# Patient Record
Sex: Female | Born: 1940
Health system: Southern US, Community
[De-identification: ages and names within clinical notes are randomized; demographics above are authoritative.]

## PROBLEM LIST (undated history)

## (undated) DIAGNOSIS — E559 Vitamin D deficiency, unspecified: Secondary | ICD-10-CM

## (undated) DIAGNOSIS — Z87440 Personal history of urinary (tract) infections: Secondary | ICD-10-CM

## (undated) DIAGNOSIS — K219 Gastro-esophageal reflux disease without esophagitis: Secondary | ICD-10-CM

## (undated) DIAGNOSIS — E785 Hyperlipidemia, unspecified: Secondary | ICD-10-CM

## (undated) DIAGNOSIS — R0600 Dyspnea, unspecified: Secondary | ICD-10-CM

## (undated) DIAGNOSIS — I5189 Other ill-defined heart diseases: Secondary | ICD-10-CM

## (undated) DIAGNOSIS — R0609 Other forms of dyspnea: Secondary | ICD-10-CM

## (undated) DIAGNOSIS — R251 Tremor, unspecified: Secondary | ICD-10-CM

## (undated) DIAGNOSIS — I351 Nonrheumatic aortic (valve) insufficiency: Secondary | ICD-10-CM

## (undated) DIAGNOSIS — Z86711 Personal history of pulmonary embolism: Secondary | ICD-10-CM

## (undated) DIAGNOSIS — I10 Essential (primary) hypertension: Secondary | ICD-10-CM

## (undated) DIAGNOSIS — R079 Chest pain, unspecified: Secondary | ICD-10-CM

## (undated) DIAGNOSIS — I5030 Unspecified diastolic (congestive) heart failure: Secondary | ICD-10-CM

## (undated) DIAGNOSIS — I480 Paroxysmal atrial fibrillation: Secondary | ICD-10-CM

## (undated) DIAGNOSIS — Z8701 Personal history of pneumonia (recurrent): Secondary | ICD-10-CM

## (undated) DIAGNOSIS — M810 Age-related osteoporosis without current pathological fracture: Secondary | ICD-10-CM

## (undated) DIAGNOSIS — I340 Nonrheumatic mitral (valve) insufficiency: Secondary | ICD-10-CM

## (undated) DIAGNOSIS — J9610 Chronic respiratory failure, unspecified whether with hypoxia or hypercapnia: Secondary | ICD-10-CM

## (undated) DIAGNOSIS — I4891 Unspecified atrial fibrillation: Secondary | ICD-10-CM

## (undated) DIAGNOSIS — I959 Hypotension, unspecified: Secondary | ICD-10-CM

## (undated) DIAGNOSIS — Z8673 Personal history of transient ischemic attack (TIA), and cerebral infarction without residual deficits: Secondary | ICD-10-CM

## (undated) HISTORY — PX: LUMBAR SPINE SURGERY: SHX701

## (undated) HISTORY — PX: CERVICAL SPINE SURGERY: SHX589

## (undated) HISTORY — PX: OTHER SURGICAL HISTORY: SHX169

## (undated) HISTORY — DX: Nonrheumatic aortic (valve) insufficiency: I35.1

## (undated) HISTORY — DX: Nonrheumatic mitral (valve) insufficiency: I34.0

## (undated) HISTORY — PX: TONSILLECTOMY: SUR1361

## (undated) HISTORY — DX: Chest pain, unspecified: R07.9

## (undated) HISTORY — DX: Dyspnea, unspecified: R06.00

## (undated) HISTORY — DX: Age-related osteoporosis without current pathological fracture: M81.0

## (undated) HISTORY — DX: Unspecified diastolic (congestive) heart failure: I50.30

## (undated) HISTORY — DX: Essential (primary) hypertension: I10

## (undated) HISTORY — DX: Chronic respiratory failure, unspecified whether with hypoxia or hypercapnia: J96.10

## (undated) HISTORY — PX: COLONOSCOPY WITH ESOPHAGOGASTRODUODENOSCOPY (EGD): SHX5779

## (undated) HISTORY — PX: CHOLECYSTECTOMY: SHX55

## (undated) HISTORY — PX: BLADDER SURGERY: SHX569

## (undated) HISTORY — DX: Gastro-esophageal reflux disease without esophagitis: K21.9

## (undated) HISTORY — DX: Other ill-defined heart diseases: I51.89

## (undated) HISTORY — DX: Personal history of pneumonia (recurrent): Z87.01

## (undated) HISTORY — DX: Hypotension, unspecified: I95.9

## (undated) HISTORY — DX: Hyperlipidemia, unspecified: E78.5

## (undated) HISTORY — PX: REPLACEMENT TOTAL KNEE: SUR1224

## (undated) HISTORY — PX: COLONOSCOPY: SHX174

## (undated) HISTORY — DX: Tremor, unspecified: R25.1

## (undated) HISTORY — DX: Vitamin D deficiency, unspecified: E55.9

## (undated) HISTORY — DX: Other forms of dyspnea: R06.09

## (undated) HISTORY — DX: Personal history of pulmonary embolism: Z86.711

## (undated) HISTORY — DX: Unspecified atrial fibrillation: I48.91

## (undated) HISTORY — DX: Personal history of urinary (tract) infections: Z87.440

## (undated) HISTORY — DX: Paroxysmal atrial fibrillation: I48.0

## (undated) HISTORY — DX: Personal history of transient ischemic attack (TIA), and cerebral infarction without residual deficits: Z86.73

---

## 1999-03-11 DIAGNOSIS — Z86711 Personal history of pulmonary embolism: Secondary | ICD-10-CM

## 1999-03-11 HISTORY — DX: Personal history of pulmonary embolism: Z86.711

## 2016-03-10 DIAGNOSIS — Z8673 Personal history of transient ischemic attack (TIA), and cerebral infarction without residual deficits: Secondary | ICD-10-CM

## 2016-03-10 HISTORY — DX: Personal history of transient ischemic attack (TIA), and cerebral infarction without residual deficits: Z86.73

## 2019-05-23 DIAGNOSIS — I480 Paroxysmal atrial fibrillation: Secondary | ICD-10-CM | POA: Diagnosis not present

## 2019-05-23 DIAGNOSIS — N390 Urinary tract infection, site not specified: Secondary | ICD-10-CM | POA: Diagnosis not present

## 2019-05-23 DIAGNOSIS — Z0189 Encounter for other specified special examinations: Secondary | ICD-10-CM | POA: Diagnosis not present

## 2019-05-23 DIAGNOSIS — E559 Vitamin D deficiency, unspecified: Secondary | ICD-10-CM | POA: Diagnosis not present

## 2019-05-23 DIAGNOSIS — G9009 Other idiopathic peripheral autonomic neuropathy: Secondary | ICD-10-CM | POA: Diagnosis not present

## 2019-06-08 DIAGNOSIS — G909 Disorder of the autonomic nervous system, unspecified: Secondary | ICD-10-CM | POA: Diagnosis not present

## 2019-06-08 DIAGNOSIS — E559 Vitamin D deficiency, unspecified: Secondary | ICD-10-CM | POA: Diagnosis not present

## 2019-06-08 DIAGNOSIS — G4700 Insomnia, unspecified: Secondary | ICD-10-CM | POA: Diagnosis not present

## 2019-06-08 DIAGNOSIS — I480 Paroxysmal atrial fibrillation: Secondary | ICD-10-CM | POA: Diagnosis not present

## 2019-06-08 DIAGNOSIS — E785 Hyperlipidemia, unspecified: Secondary | ICD-10-CM | POA: Diagnosis not present

## 2019-06-13 DIAGNOSIS — E559 Vitamin D deficiency, unspecified: Secondary | ICD-10-CM | POA: Diagnosis not present

## 2019-06-13 DIAGNOSIS — M130 Polyarthritis, unspecified: Secondary | ICD-10-CM | POA: Diagnosis not present

## 2019-06-13 DIAGNOSIS — N390 Urinary tract infection, site not specified: Secondary | ICD-10-CM | POA: Diagnosis not present

## 2019-06-13 DIAGNOSIS — I480 Paroxysmal atrial fibrillation: Secondary | ICD-10-CM | POA: Diagnosis not present

## 2019-06-13 DIAGNOSIS — G9009 Other idiopathic peripheral autonomic neuropathy: Secondary | ICD-10-CM | POA: Diagnosis not present

## 2019-07-05 DIAGNOSIS — B3789 Other sites of candidiasis: Secondary | ICD-10-CM | POA: Diagnosis not present

## 2019-07-26 DIAGNOSIS — L304 Erythema intertrigo: Secondary | ICD-10-CM | POA: Diagnosis not present

## 2019-08-11 ENCOUNTER — Encounter: Payer: Self-pay | Admitting: *Deleted

## 2019-08-15 ENCOUNTER — Encounter: Payer: Self-pay | Admitting: Neurology

## 2019-08-15 ENCOUNTER — Other Ambulatory Visit: Payer: Self-pay

## 2019-08-15 ENCOUNTER — Telehealth: Payer: Self-pay | Admitting: Neurology

## 2019-08-15 ENCOUNTER — Ambulatory Visit: Payer: Medicare Other | Admitting: Neurology

## 2019-08-15 VITALS — BP 138/81 | HR 58 | Ht 61.0 in | Wt 189.5 lb

## 2019-08-15 DIAGNOSIS — G250 Essential tremor: Secondary | ICD-10-CM

## 2019-08-15 DIAGNOSIS — G3281 Cerebellar ataxia in diseases classified elsewhere: Secondary | ICD-10-CM

## 2019-08-15 DIAGNOSIS — I639 Cerebral infarction, unspecified: Secondary | ICD-10-CM | POA: Diagnosis not present

## 2019-08-15 DIAGNOSIS — R269 Unspecified abnormalities of gait and mobility: Secondary | ICD-10-CM

## 2019-08-15 MED ORDER — PROPRANOLOL HCL 40 MG PO TABS: 40.0000 mg | ORAL_TABLET | Freq: Two times a day (BID) | ORAL | 4 refills | Status: DC

## 2019-08-15 NOTE — Progress Notes (Signed)
PATIENT: Monique Robles DOB: Jan 08, 1941  Chief Complaint  Patient presents with  . Tremors    She is here with her daughter, [name]. Reports tremors in head, lips and hands. She is currently taking propranolol 40mg  BID.  Marland Kitchen PCP    Noreene Larsson, NP     HISTORICAL  Monique Robles is a 79 year old female, seen in request by her primary care nurse practitioner Demetrius Revel for evaluation of tremor, stroke, initial evaluation was on August 15, 2019, she is accompanied by her daughter [name] at today's clinical visit.  I have reviewed and summarized the referring note from the referring physician.  She moved from [place] to to live with her daughter since November 2020, suffered a stroke in 2018, presented with left visual field deficit, left side sensory loss, with mild residual gait abnormality, she also has atrial fibrillation, is taking Eliquis, reported a history of PE, vascular risk factor of obesity, hyperlipidemia,  I also reviewed previous neurology visit by Dr. Jeronimo Greaves from [facility] in [facility], patient was admitted to the hospital on February 16, 2017 with acute stroke involving right posterior cerebral artery territory with petechiae hemorrhage, parietal infarction he was not treated for atrial fibrillation, she was allergic to Coumadin, she was treated with Eliquis since stroke admission,  She has strong family history of essential tremor, her mother, 3 sisters all suffered tremor, she presented with more than 20 years history of head titubation, gradually worsening, no noticed mild hand tremor, jaw shaking, there was no significant limitation in her daily activity, she has been treated with propanolol 40 mg twice a day for many years, initially was helpful, now she noticed increased jaw tremor,  She has mild gait abnormality that is attributed to her obesity, residual left side difficulty from stroke, and chronic neck, upper back, low back pain, had a  history of rods put in to her back in the past,  She also complains of bilateral underneath breast skin rash, which has been developed since March 2021, itching, painful, I have suggested dermatology evaluation  REVIEW OF SYSTEMS: Full 14 system review of systems performed and notable only for as above All other review of systems were negative.  ALLERGIES: Allergies  Allergen Reactions  . Coumadin [Warfarin] Hives  . Atorvastatin Other (See Comments)    Muscle aches  . Sulfa Antibiotics Itching and Rash    HOME MEDICATIONS: Current Outpatient Medications  Medication Sig Dispense Refill  . Acetaminophen (TYLENOL ARTHRITIS PAIN PO) Take 650 mg by mouth daily as needed.     Marland Kitchen apixaban (ELIQUIS) 5 MG TABS tablet Take 5 mg by mouth 2 (two) times daily.    . APPLE CIDER VINEGAR PO Take 1 tablet by mouth daily.    . calcium carbonate (TUMS) 500 MG chewable tablet Chew 1 tablet by mouth daily as needed for indigestion or heartburn.    . cetirizine (ZYRTEC) 10 MG tablet Take 10 mg by mouth daily.    . diphenhydrAMINE HCl (BENADRYL PO) Take 1 tablet by mouth as needed (seasonal allergies).    . gabapentin (NEURONTIN) 600 MG tablet Take 600 mg by mouth 3 (three) times daily.    . Melatonin 10 MG TABS Take 10 mg by mouth at bedtime.    . mirabegron ER (MYRBETRIQ) 25 MG TB24 tablet Take 25 mg by mouth daily.    . Multiple Vitamin (MULTIVITAMIN) tablet Take 1 tablet by mouth daily.    Marland Kitchen Nystatin POWD  2 application by Does not apply route as needed.    . Probiotic Product (PROBIOTIC PO) Take 1 tablet by mouth daily.    . propranolol (INDERAL) 40 MG tablet Take 40 mg by mouth 2 (two) times daily.     Marland Kitchen VITAMIN D PO Take 1,000 Units by mouth daily.     No current facility-administered medications for this visit.    PAST MEDICAL HISTORY: Past Medical History:  Diagnosis Date  . Aortic insufficiency   . Atrial fibrillation ([place])   . Diastolic dysfunction   . DOE (dyspnea on exertion)   .  GERD (gastroesophageal reflux disease)   . History of pneumonia   . History of pulmonary embolism   . History of stroke   . HTN (hypertension)   . Hyperlipemia   . Mitral valve regurgitation   . Osteoporosis   . Tremor   . Vitamin D deficiency     PAST SURGICAL HISTORY: Past Surgical History:  Procedure Laterality Date  . BLADDER SURGERY     x 3  . CERVICAL SPINE SURGERY    . CHOLECYSTECTOMY    . COLONOSCOPY    . COLONOSCOPY WITH ESOPHAGOGASTRODUODENOSCOPY (EGD)    . LUMBAR SPINE SURGERY    . REPLACEMENT TOTAL KNEE Left   . RIGHT LUNG WEDGE REMOVAL    . TONSILLECTOMY      FAMILY HISTORY: Family History  Problem Relation Age of Onset  . Alzheimer's disease Mother        died at 74  . Cancer Mother   . Tremor Mother   . Pneumonia Father   . Heart disease Father   . Diabetes Father   . Heart attack Father        died at 46    SOCIAL HISTORY: Social History   Socioeconomic History  . Marital status: Widowed    Spouse name: Not on file  . Number of children: 3  . Years of education: 92  . Highest education level: High school graduate  Occupational History  . Occupation: Retired  Tobacco Use  . Smoking status: Never Smoker  . Smokeless tobacco: Never Used  Substance and Sexual Activity  . Alcohol use: Not Currently  . Drug use: Never  . Sexual activity: Not on file  Other Topics Concern  . Not on file  Social History Narrative   Lives with her daughter.   Right-handed.   Rare caffeine use.   Social Determinants of Health   Financial Resource Strain:   . Difficulty of Paying Living Expenses:   Food Insecurity:   . Worried About Charity fundraiser in the Last Year:   . Arboriculturist in the Last Year:   Transportation Needs:   . Film/video editor (Medical):   Marland Kitchen Lack of Transportation (Non-Medical):   Physical Activity:   . Days of Exercise per Week:   . Minutes of Exercise per Session:   Stress:   . Feeling of Stress :   Social  Connections:   . Frequency of Communication with Friends and Family:   . Frequency of Social Gatherings with Friends and Family:   . Attends Religious Services:   . Active Member of Clubs or Organizations:   . Attends Archivist Meetings:   Marland Kitchen Marital Status:   Intimate Partner Violence:   . Fear of Current or Ex-Partner:   . Emotionally Abused:   Marland Kitchen Physically Abused:   . Sexually Abused:      PHYSICAL  EXAM   Vitals:   08/15/19 1614  BP: 138/81  Pulse: (!) 58  Weight: 189 lb 8 oz (86 kg)  Height: 5\' 1"  (1.549 m)    Not recorded      Body mass index is 35.81 kg/m.  PHYSICAL EXAMNIATION:  Gen: NAD, conversant, well nourised, well groomed                     Cardiovascular: Regular rate rhythm, no peripheral edema, warm, nontender. Eyes: Conjunctivae clear without exudates or hemorrhage Neck: Supple, no carotid bruits. Pulmonary: Clear to auscultation bilaterally   NEUROLOGICAL EXAM:  MENTAL STATUS: Speech:    Speech is normal; fluent and spontaneous with normal comprehension.  Cognition:     Orientation to time, place and person     Normal recent and remote memory     Normal Attention span and concentration     Normal Language, naming, repeating,spontaneous speech     Fund of knowledge   CRANIAL NERVES: CN II: Restricted visual field bilaterally, with left hemivisual field deficit, pupils are round equal and briskly reactive to light. CN III, IV, VI: extraocular movement are normal. No ptosis. CN V: Facial sensation is intact to light touch CN VII: Face is symmetric with normal eye closure  CN VIII: Hearing is normal to causal conversation. CN IX, X: Phonation is normal. CN XI: Head turning and shoulder shrug are intact  MOTOR: Mild fixation of left upper extremity on rapid rotating movement, drift of left lower extremity  REFLEXES: Reflexes are 1 and symmetric at the biceps, triceps, knees, and ankles. Plantar responses are  flexor.  SENSORY: Left hemisensory loss on double spontaneous stimulation  COORDINATION: There is no trunk or limb dysmetria noted.  GAIT/STANCE: Need push-up to get up from seated position, tends to hold her left arm and elbow flexion, decreased left arm swing, dragging her left leg some, mildly unsteady,   DIAGNOSTIC DATA (LABS, IMAGING, TESTING) - I reviewed patient records, labs, notes, testing and imaging myself where available.   ASSESSMENT AND PLAN  Shawnell Dykes is a 79 y.o. female   Essential tremor  Slowly progressive which is expected, there was no parkinsonian features, her heart rate is 58, blood pressure was within within normal limit, will keep current dose of propanolol 40 mg twice a day, refill the prescription,  History of stroke, atrial fibrillation,  On Eliquis treatment,  MRI of brain, evidence of petechiae hemorrhage with right PCA stroke in December 2018  Gait abnormality  Multifactorial, residual deficit from stroke, obesity,  Refer to physical therapy  She also complains of bilateral underneath breast skin rash, which has been developed since March 2021, itching, painful, I have suggested dermatology evaluation  Marcial Pacas, M.D. Ph.D.  [facility] [address] [place], [place] [address] Ph: [phone] Fax: [phone]  CC: Noreene Larsson, NP

## 2019-08-15 NOTE — Telephone Encounter (Signed)
She wants MRI brain done at [facility]

## 2019-08-17 NOTE — Telephone Encounter (Signed)
[employer] medicare no auth. Patient is scheduled at [facility] for 09/09/19 arrival time is 12:30 pm. I left a voicemail for patient for her to aware I left their number of [phone] if she needed to r/s.

## 2019-08-18 DIAGNOSIS — L304 Erythema intertrigo: Secondary | ICD-10-CM | POA: Diagnosis not present

## 2019-09-09 ENCOUNTER — Ambulatory Visit (HOSPITAL_COMMUNITY)
Admission: RE | Admit: 2019-09-09 | Discharge: 2019-09-09 | Disposition: A | Discharge status: Home / Self Care | Payer: Medicare Other | Source: Ambulatory Visit | Attending: Neurology | Admitting: Neurology

## 2019-09-09 ENCOUNTER — Other Ambulatory Visit: Payer: Self-pay

## 2019-09-09 DIAGNOSIS — H7093 Unspecified mastoiditis, bilateral: Secondary | ICD-10-CM | POA: Diagnosis not present

## 2019-09-09 DIAGNOSIS — I6782 Cerebral ischemia: Secondary | ICD-10-CM | POA: Diagnosis not present

## 2019-09-09 DIAGNOSIS — G3281 Cerebellar ataxia in diseases classified elsewhere: Secondary | ICD-10-CM | POA: Diagnosis not present

## 2019-09-09 DIAGNOSIS — J3489 Other specified disorders of nose and nasal sinuses: Secondary | ICD-10-CM | POA: Diagnosis not present

## 2019-09-09 DIAGNOSIS — I6381 Other cerebral infarction due to occlusion or stenosis of small artery: Secondary | ICD-10-CM | POA: Diagnosis not present

## 2019-09-09 IMAGING — MR MR HEAD W/O CM
10 series · 48 of 48 positions shown · non-contrast
Comparison: No pertinent prior studies available for comparison.

CLINICAL DATA: Cerebellar ataxia in diseases classified elsewhere.
Ataxia, stroke suspected. Additional history provided by
technologist: Patient reports stroke 2.5 years ago, confusion,
vision problems, tremors, pain, numbness and weakness left side,
difficulty walking.

EXAM:
MRI HEAD WITHOUT CONTRAST
TECHNIQUE: Multiplanar, multiecho pulse sequences of the brain and surrounding
structures were obtained without intravenous contrast.

[Series 3: DWI · axial · 3.0mm · 1.20mm/px · z∈[-66,+63]mm · 7 of 96 slices shown (1 of 4)]
[im 1/96]
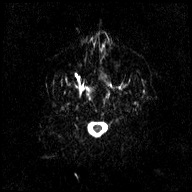
[im 16/96]
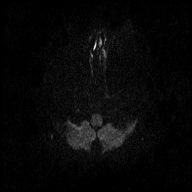
[im 32/96]
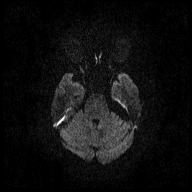
[im 48/96]
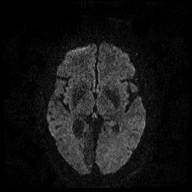
[im 64/96]
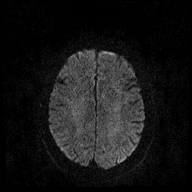
[im 80/96]
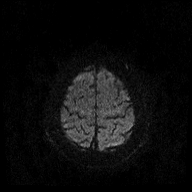
[im 96/96]
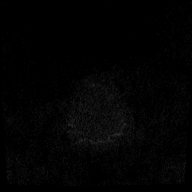

[Series 4: DWI · axial · 3.0mm · 1.20mm/px · z∈[-66,+63]mm · 4 of 48 slices shown (2 of 4)]
[im 1/48]
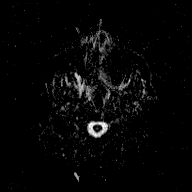
[im 16/48]
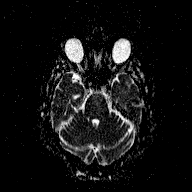
[im 32/48]
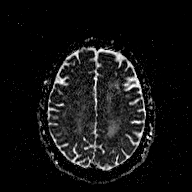
[im 48/48]
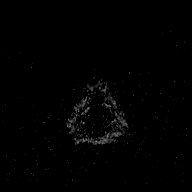

[Series 5: DWI · coronal · 3.0mm · 1.15mm/px · 7 of 89 slices shown (3 of 4)]
[im 1/89]
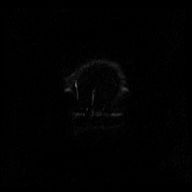
[im 15/89]
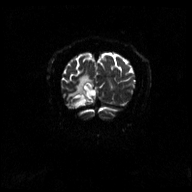
[im 30/89]
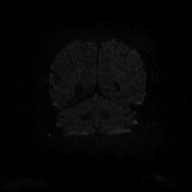
[im 45/89]
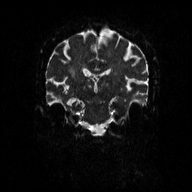
[im 59/89]
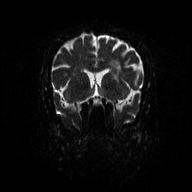
[im 74/89]
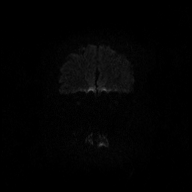
[im 89/89]
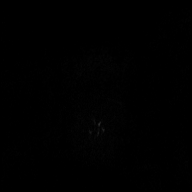

[Series 6: DWI · coronal · 3.0mm · 1.15mm/px · 3 of 45 slices shown (4 of 4)]
[im 1/45]
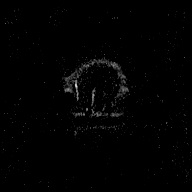
[im 23/45]
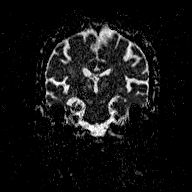
[im 45/45]
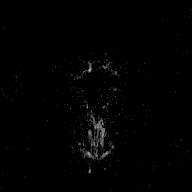

[Series 7: T1 · sagittal · 5.0mm · 0.45mm/px · 2 of 21 slices shown (1 of 2)]
[im 1/21]
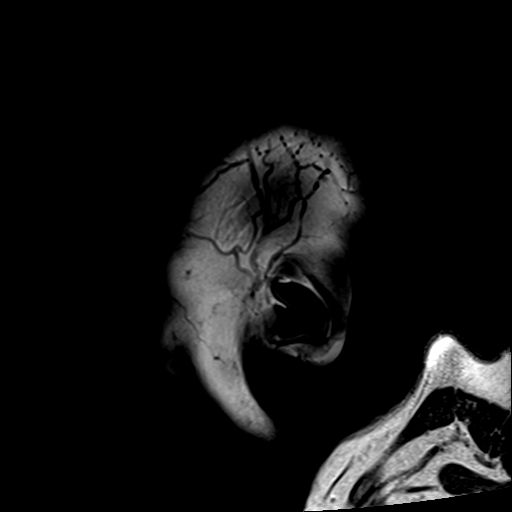
[im 21/21]
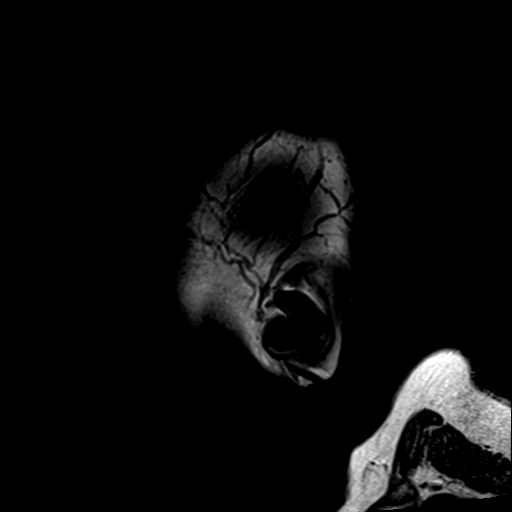

[Series 8: T2 · axial · 3.0mm · 0.72mm/px · z∈[-64,+59]mm · 4 of 46 slices shown (1 of 3)]
[im 1/46]
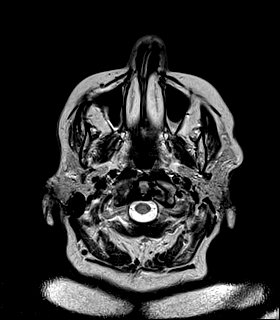
[im 16/46]
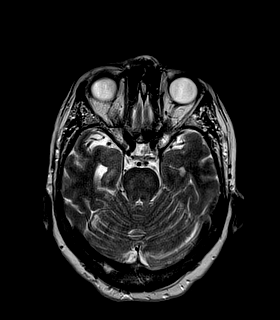
[im 31/46]
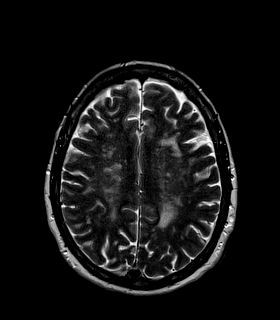
[im 46/46]
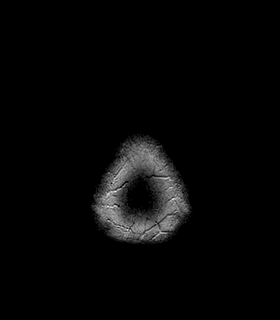

[Series 12: T1 · axial · 1.0mm · 1.00mm/px · z∈[-73,+57]mm · 11 of 144 slices shown (2 of 2)]
[im 1/144]
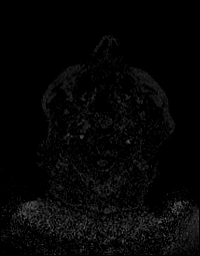
[im 15/144]
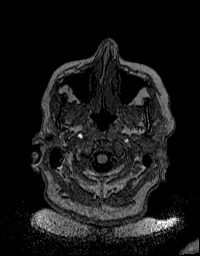
[im 29/144]
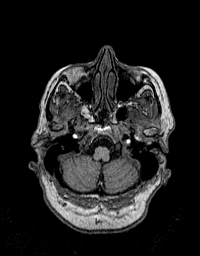
[im 43/144]
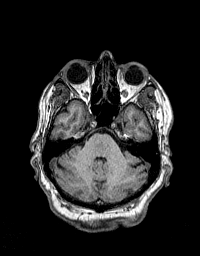
[im 58/144]
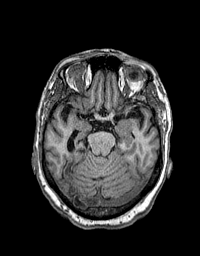
[im 72/144]
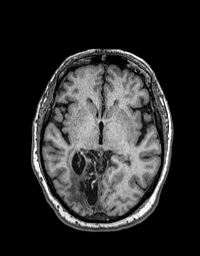
[im 86/144]
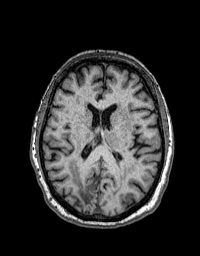
[im 101/144]
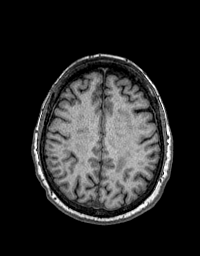
[im 115/144]
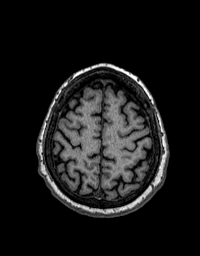
[im 129/144]
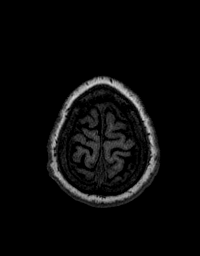
[im 144/144]
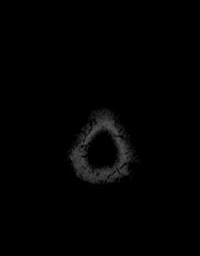

[Series 13: T2 · coronal · 5.0mm · 0.43mm/px · 2 of 26 slices shown (2 of 3)]
[im 1/26]
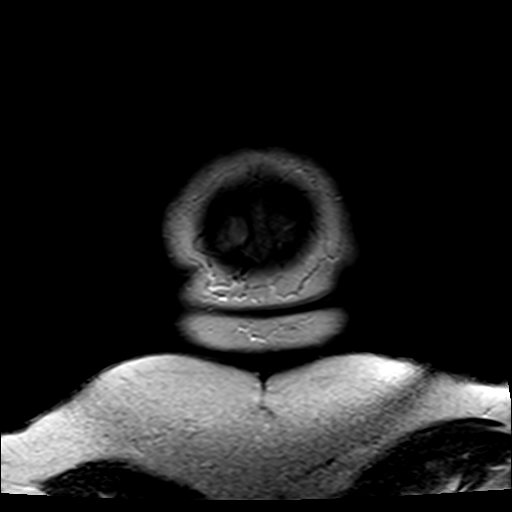
[im 26/26]
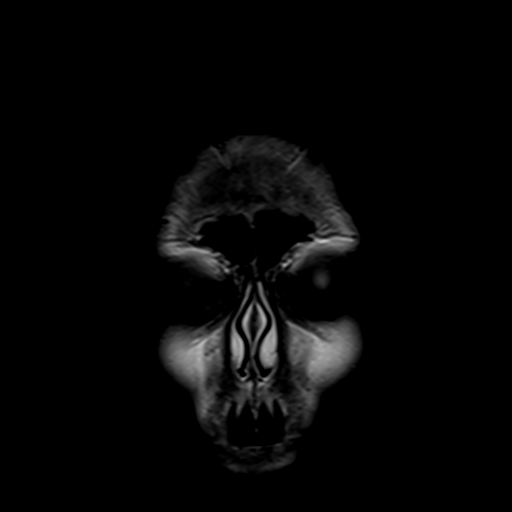

[Series 14: FLAIR · axial · 3.0mm · 0.45mm/px · z∈[-87,+60]mm · 4 of 55 slices shown]
[im 1/55]
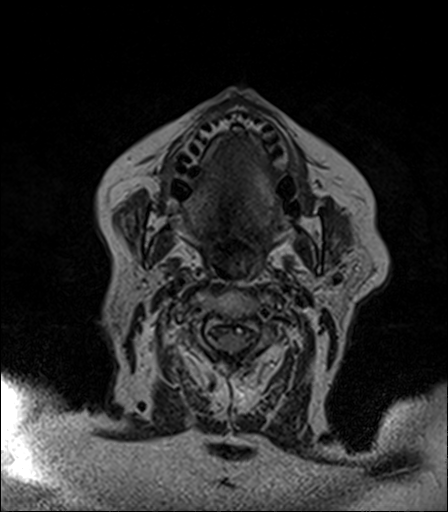
[im 19/55]
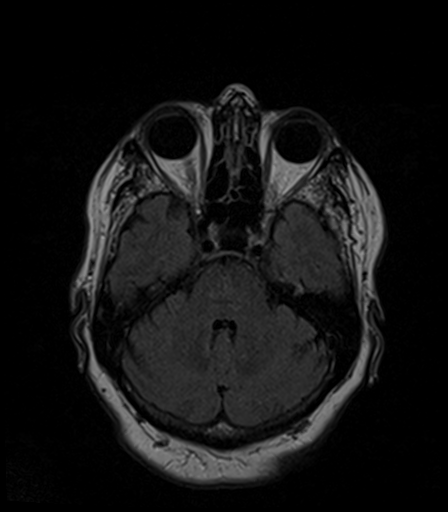
[im 37/55]
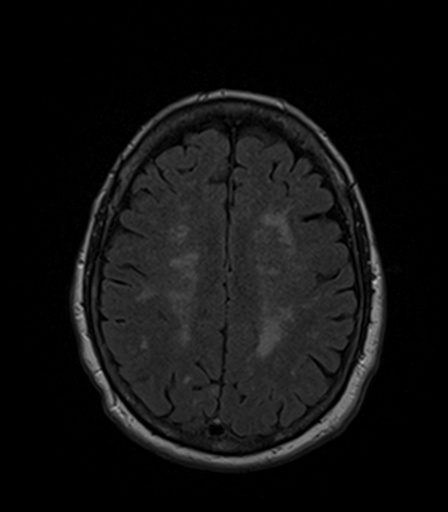
[im 55/55]
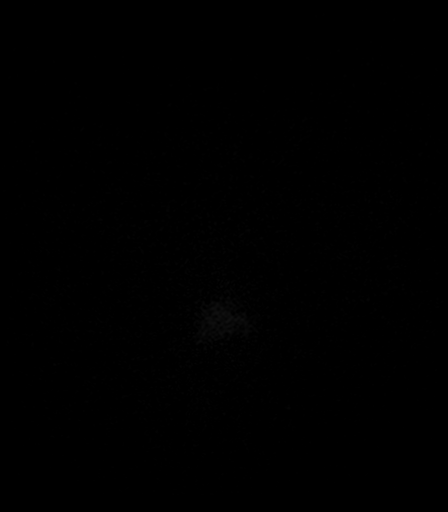

[Series 15: T2 · axial · 3.0mm · 0.72mm/px · z∈[-87,+60]mm · 4 of 55 slices shown (3 of 3)]
[im 1/55]
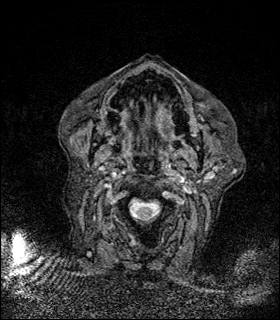
[im 19/55]
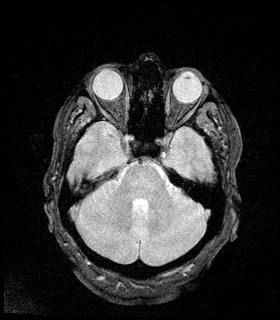
[im 37/55]
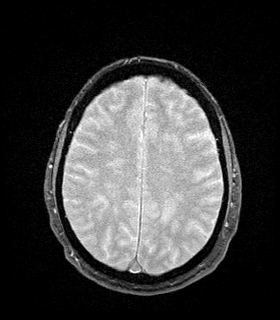
[im 55/55]
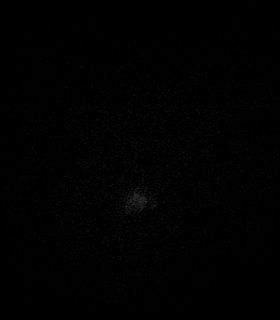

[48 of 48 positions shown; findings below may reference images not displayed]

FINDINGS: Brain:

Mild intermittent motion degradation.

Cerebral volume is normal for age.

There is a chronic right occipital lobe PCA territory infarct with
associated chronic hemosiderin deposition at this site.

Moderate patchy T2/FLAIR hyperintensity within the cerebral white
matter and pons is nonspecific, but consistent with chronic small
vessel ischemic disease. There are chronic lacunar infarcts within
the basal ganglia, thalami and cerebellum bilaterally.

There is no acute infarct.

No evidence of intracranial mass.

No extra-axial fluid collection.

No midline shift.

Vascular: Expected proximal arterial flow voids.

Skull and upper cervical spine: No focal marrow lesion. Cervical
spondylosis. Additionally, there is susceptibility artifact from
incompletely assessed postsurgical changes to the cervical spine.

Sinuses/Orbits: Visualized orbits show no acute finding. Mild
ethmoid and maxillary sinus mucosal thickening. Trace fluid within
bilateral mastoid air cells.

Other: 10 mm T2 hyperintense round lesion within the right parotid
gland suspicious for primary parotid neoplasm.
IMPRESSION: 1. Mildly motion degraded examination.
2. No evidence of acute intracranial abnormality. Specifically, no
evidence of acute or recent subacute infarction.
3. Chronic right PCA territory occipital lobe infarct.
4. Chronic lacunar infarcts within the basal ganglia, thalami and
cerebellum bilaterally.
5. Background moderate chronic small vessel ischemic disease within
the cerebral white matter and pons.
6. 10 mm round T2 hyperintense lesion within the right parotid
gland, suspicious for primary parotid neoplasm. Consider ENT
consultation.
7. Mild ethmoid and maxillary sinus mucosal thickening.
8. Trace bilateral mastoid fluid.

## 2019-09-13 ENCOUNTER — Telehealth: Payer: Self-pay | Admitting: Neurology

## 2019-09-13 DIAGNOSIS — C07 Malignant neoplasm of parotid gland: Secondary | ICD-10-CM

## 2019-09-13 NOTE — Addendum Note (Signed)
Addended by: Noberto Retort C on: 09/13/2019 09:45 AM   Modules accepted: Orders

## 2019-09-13 NOTE — Telephone Encounter (Signed)
IMPRESSION: 1. Mildly motion degraded examination. 2. No evidence of acute intracranial abnormality. Specifically, no evidence of acute or recent subacute infarction. 3. Chronic right PCA territory occipital lobe infarct. 4. Chronic lacunar infarcts within the basal ganglia, thalami and cerebellum bilaterally. 5. Background moderate chronic small vessel ischemic disease within the cerebral white matter and pons. 6. 10 mm round T2 hyperintense lesion within the right parotid gland, suspicious for primary parotid neoplasm. Consider ENT consultation. 7. Mild ethmoid and maxillary sinus mucosal thickening. 8. Trace bilateral mastoid fluid.  Please call patient, MRI of the brain showed no acute abnormality, evidence of old right PCA territory occipital stroke, multiple supratentorium chronic lacunar infarction, also small vessel disease  Right parotid gland incidental 1 cm hypodensity lesion, likely primary parotid neoplasm, if she noticed any right infra-auricular region pain, or mass, consider ENT evaluation

## 2019-09-13 NOTE — Telephone Encounter (Signed)
I spoke to the patient's daughter on [place]. She verbalized understanding of the MRI results below. She would like the ENT referral placed. She is going to review the results with her mother and make a decision with her about the ENT appt.

## 2019-09-14 ENCOUNTER — Telehealth (HOSPITAL_COMMUNITY): Payer: Self-pay | Admitting: Physical Therapy

## 2019-09-14 ENCOUNTER — Ambulatory Visit (HOSPITAL_COMMUNITY): Payer: Medicare Other | Admitting: Physical Therapy

## 2019-09-14 NOTE — Telephone Encounter (Signed)
pt cancelled appt today because she is sick

## 2019-09-19 ENCOUNTER — Other Ambulatory Visit: Payer: Self-pay

## 2019-09-19 ENCOUNTER — Encounter (HOSPITAL_COMMUNITY): Payer: Self-pay | Admitting: Physical Therapy

## 2019-09-19 ENCOUNTER — Ambulatory Visit (HOSPITAL_COMMUNITY): Payer: Medicare Other | Attending: Neurology | Admitting: Physical Therapy

## 2019-09-19 DIAGNOSIS — G8929 Other chronic pain: Secondary | ICD-10-CM | POA: Insufficient documentation

## 2019-09-19 DIAGNOSIS — M545 Low back pain, unspecified: Secondary | ICD-10-CM

## 2019-09-19 DIAGNOSIS — M6281 Muscle weakness (generalized): Secondary | ICD-10-CM | POA: Insufficient documentation

## 2019-09-19 DIAGNOSIS — R262 Difficulty in walking, not elsewhere classified: Secondary | ICD-10-CM | POA: Insufficient documentation

## 2019-09-19 NOTE — Therapy (Signed)
[facility] [address] [place], [place], [address] Phone: [phone]   Fax:  [phone]  Physical Therapy Evaluation  Patient Details  Name: Monique Robles MRN: [id number] Date of Birth: 01-24-1941 Referring Provider (PT): Marcial Pacas    Encounter Date: 09/19/2019   PT End of Session - 09/19/19 1455    Visit Number 1    Number of Visits 8    Date for PT Re-Evaluation 10/24/19    Authorization Type medicare, no auth , no VL    Progress Note Due on Visit 10    PT Start Time 1350    PT Stop Time 1445    PT Time Calculation (min) 55 min    Equipment Utilized During Treatment Gait belt    Activity Tolerance Patient limited by pain;Patient limited by fatigue    Behavior During Therapy [facility] for tasks assessed/performed           Past Medical History:  Diagnosis Date  . Aortic insufficiency   . Atrial fibrillation ([place])   . Diastolic dysfunction   . DOE (dyspnea on exertion)   . GERD (gastroesophageal reflux disease)   . History of pneumonia   . History of pulmonary embolism   . History of stroke   . HTN (hypertension)   . Hyperlipemia   . Mitral valve regurgitation   . Osteoporosis   . Tremor   . Vitamin D deficiency     Past Surgical History:  Procedure Laterality Date  . BLADDER SURGERY     x 3  . CERVICAL SPINE SURGERY    . CHOLECYSTECTOMY    . COLONOSCOPY    . COLONOSCOPY WITH ESOPHAGOGASTRODUODENOSCOPY (EGD)    . LUMBAR SPINE SURGERY    . REPLACEMENT TOTAL KNEE Left   . RIGHT LUNG WEDGE REMOVAL    . TONSILLECTOMY      There were no vitals filed for this visit.    Subjective Assessment - 09/19/19 1407    Subjective Patient reports she has a stroke in December of 2018. She went to rehab and did really well but when she returned home she wasn't as active and got really weak. She also has gained a good bit of weight since the stroke. Reports that she recently moved to [place] to live with her family as her husband  just passed away. She lives on the ground floor but comes up stairs for dinner once a day. Her family is not home during the day but she is able to manage by herself. States that she does not use a walker or a cane but has them at home. Reports she tends to hold onto walls/furniture secondary to her visual deficits and her balance. Reports she also has back pain that limits her ability to perform different tasks as moving increases her pain. She would like to having improved mobility, balance and ability to go up stairs as well as less back pain.    Pertinent History bilateral peripherial field loss, LBP    Patient Stated Goals to be able to walk better, improved balance, go up and down stairs, have less back pain    Currently in Pain? Yes    Pain Score 8     Pain Location Back    Pain Orientation Lower;Mid    Pain Descriptors / Indicators Sharp    Pain Type Chronic pain    Pain Onset More than a month ago    Aggravating Factors  movement, walking  Pain Relieving Factors biofreeze, gabapentin, rest              [facility] PT Assessment - 09/19/19 0001      Assessment   Medical Diagnosis tremor, ataxia, gait disturbance    Referring Provider (PT) Marcial Pacas     Prior Therapy yes in rehab       Precautions   Precautions Fall;Other (comment)   visual - peripheral field blindness     Restrictions   Weight Bearing Restrictions No      Balance Screen   Has the patient fallen in the past 6 months No    Has the patient had a decrease in activity level because of a fear of falling?  Yes    Is the patient reluctant to leave their home because of a fear of falling?  Yes      [place] Private residence    Living Arrangements Children    Available Help at Discharge Family    Type of [place] to enter    Home Layout Two level    Alternate Level Stairs-Number of Steps [place] - 2 wheels;Cane - single point      Prior  Function   Level of Independence Independent with community mobility with device;Needs assistance with ADLs;Needs assistance with homemaking    Vocation Retired      Associate Professor   Overall Cognitive Status Within Functional Limits for tasks assessed      Observation/Other Assessments   Observations slumped posture, forward head     Focus on Therapeutic Outcomes (FOTO)  34.25% function      Transfers   Five time sit to stand comments  48.21 seconds    with UE on thighs     Ambulation/Gait   Ambulation/Gait Yes    Ambulation/Gait Assistance 4: Min guard    Ambulation Distance (Feet) 95 Feet    Assistive device None    Gait Pattern Decreased arm swing - right;Decreased arm swing - left;Decreased step length - right;Decreased step length - left;Decreased hip/knee flexion - right;Decreased hip/knee flexion - left;Trunk flexed;Wide base of support   increased frontal plane motion    Ambulation Surface Level;Indoor    Gait velocity decreased     Gait Comments 2MW, with RW - improved gait noted                      Objective measurements completed on examination: See above findings.               PT Education - 09/19/19 1513    Education Details Educated patient and daughter on plan, on possible benefits of occupational therapy, aquatic therapy and PT moving forward. Discussed and educated patient in use of RW at all times to improve ambulatory mobility and decrease risk of falling especially with visual field loss.    Person(s) Educated Patient;Child(ren)    Methods Explanation    Comprehension Verbalized understanding            PT Short Term Goals - 09/19/19 1503      PT SHORT TERM GOAL #1   Title Patient will be independent in self management strategies to improve quality of life and functional outcomes.    Time 3    Period Weeks    Status New    Target Date 10/10/19      PT SHORT TERM GOAL #2  Title Patient will report at least 25% improvement in  overall symptoms and/or function to demonstrate improved functional mobility    Time 3    Period Weeks    Status New    Target Date 10/10/19             PT Long Term Goals - 09/19/19 1503      PT LONG TERM GOAL #1   Title Patient will improve on FOTO score to meet predicted outcomes to demonstrate improved functional mobility.    Time 6    Period Weeks    Status New    Target Date 10/31/19      PT LONG TERM GOAL #2   Title Patient will be able to ambulate at least 200 feet with RW in 2 minutes to demonstrate improved walking ability.    Time 6    Period Weeks    Status New    Target Date 10/31/19      PT LONG TERM GOAL #3   Title Patient will be able to perform 5x STS in <35 seconds to demonstrate improved functional strength    Time 6    Period Weeks    Status New    Target Date 10/31/19      PT LONG TERM GOAL #4   Title Patient will be able to ascend and descend stairs with reciprocal gait pattern and use of railing with SBA to improve ability to go up stairs at home.    Time 6    Period Weeks    Status New    Target Date 10/31/19                  Plan - 09/19/19 1456    Clinical Impression Statement Patient presents to therapy with weakness, balance deficits and low back pain that started in 2018 after a stroke the affected her left side. Patient highly motivated and wants to improve in overall function and strength but does have back pain that limits her ability to move at times. Patient would greatly benefit from skilled physical therapy to improve functional mobility and independence and decrease overall fall risk.    Personal Factors and Comorbidities Comorbidity 1;Comorbidity 2;Comorbidity 3+    Comorbidities cva, tremor, back pain, neck and back surgery    Examination-Activity Limitations Bathing;Bed Mobility;Caring for Others;Dressing;Transfers;Stand;Stairs;Squat;Sit    Examination-Participation Restrictions Cleaning;Community Activity     Stability/Clinical Decision Making Stable/Uncomplicated    Clinical Decision Making Low    Rehab Potential Good    PT Frequency 2x / week   8 visits over 6 week period   PT Duration Other (comment)   over 10 week period   PT Treatment/Interventions ADLs/Self Care Home Management;Aquatic Therapy;Cryotherapy;Electrical Stimulation;Moist Heat;Traction;Balance training;Therapeutic exercise;Therapeutic activities;Functional mobility training;Stair training;Gait training;DME Instruction;Neuromuscular re-education;Patient/family education;Manual techniques;Energy conservation;Passive range of motion;Joint Manipulations    PT Next Visit Plan TRA activation, functional strengthening - for HEP development that patient can perform at home, balance    PT Home Exercise Plan next session    Recommended Other Services OT -- follow up with patient if she would like to start this    Consulted and Agree with Plan of Care Patient;Family member/caregiver    Family Member Consulted Debbie           Patient will benefit from skilled therapeutic intervention in order to improve the following deficits and impairments:  Abnormal gait, Decreased endurance, Decreased strength, Decreased activity tolerance, Decreased knowledge of precautions, Decreased knowledge of use of DME, Decreased  balance, Difficulty walking, Decreased range of motion, Pain  Visit Diagnosis: Difficulty in walking, not elsewhere classified  Muscle weakness (generalized)  Chronic midline low back pain without sciatica     Problem List Patient Active Problem List   Diagnosis Date Noted  . Essential tremor 08/15/2019  . Cerebrovascular accident (CVA) ([place]) 08/15/2019  . Gait abnormality 08/15/2019   3:29 PM, 09/19/19 [name], DPT Physical Therapy with [facility]  [phone] office  [facility] [address] [place], [place], [address] Phone: [phone]   Fax:   [phone]  Name: Monique Robles MRN: [id number] Date of Birth: [birth year]

## 2019-09-22 ENCOUNTER — Encounter (HOSPITAL_COMMUNITY): Payer: Self-pay

## 2019-09-22 ENCOUNTER — Ambulatory Visit (HOSPITAL_COMMUNITY): Payer: Medicare Other

## 2019-09-22 ENCOUNTER — Other Ambulatory Visit: Payer: Self-pay

## 2019-09-22 DIAGNOSIS — G8929 Other chronic pain: Secondary | ICD-10-CM

## 2019-09-22 DIAGNOSIS — M6281 Muscle weakness (generalized): Secondary | ICD-10-CM | POA: Diagnosis not present

## 2019-09-22 DIAGNOSIS — R262 Difficulty in walking, not elsewhere classified: Secondary | ICD-10-CM

## 2019-09-22 DIAGNOSIS — M545 Low back pain: Secondary | ICD-10-CM | POA: Diagnosis not present

## 2019-09-22 NOTE — Therapy (Signed)
[facility] [place], [place], [address] Phone: [phone]   Fax:  [phone]  Physical Therapy Treatment  Patient Details  Name: Monique Robles MRN: [id number] Date of Birth: 03/22/1940 Referring Provider (PT): Marcial Pacas    Encounter Date: 09/22/2019   PT End of Session - 09/22/19 1751    Visit Number 2    Number of Visits 8    Date for PT Re-Evaluation 10/24/19    Authorization Type medicare, no auth , no VL    Progress Note Due on Visit 10    PT Start Time 1748    PT Stop Time 1826    PT Time Calculation (min) 38 min    Equipment Utilized During Treatment Gait belt    Activity Tolerance Patient limited by pain;Patient limited by fatigue;No increased pain    Behavior During Therapy WFL for tasks assessed/performed           Past Medical History:  Diagnosis Date  . Aortic insufficiency   . Atrial fibrillation ([place])   . Diastolic dysfunction   . DOE (dyspnea on exertion)   . GERD (gastroesophageal reflux disease)   . History of pneumonia   . History of pulmonary embolism   . History of stroke   . HTN (hypertension)   . Hyperlipemia   . Mitral valve regurgitation   . Osteoporosis   . Tremor   . Vitamin D deficiency     Past Surgical History:  Procedure Laterality Date  . BLADDER SURGERY     x 3  . CERVICAL SPINE SURGERY    . CHOLECYSTECTOMY    . COLONOSCOPY    . COLONOSCOPY WITH ESOPHAGOGASTRODUODENOSCOPY (EGD)    . LUMBAR SPINE SURGERY    . REPLACEMENT TOTAL KNEE Left   . RIGHT LUNG WEDGE REMOVAL    . TONSILLECTOMY      There were no vitals filed for this visit.   Subjective Assessment - 09/22/19 1749    Subjective Pt stated she has back pain today, pain scale 7-8/10 sharp pain that is constant when it hits.    Pertinent History bilateral peripherial field loss, LBP    Patient Stated Goals to be able to walk better, improved balance, go up and down stairs, have less back pain    Currently  in Pain? Yes    Pain Score 8     Pain Location Back    Pain Orientation Lower;Mid   Thoracic and lumbar pain   Pain Descriptors / Indicators Sharp    Pain Type Chronic pain    Pain Onset More than a month ago    Pain Frequency Constant    Aggravating Factors  movement, walking    Pain Relieving Factors biofreeze, gabapentic, rest                             [facility] Adult PT Treatment/Exercise - 09/22/19 0001      Bed Mobility   Bed Mobility Right Sidelying to Sit    Right Sidelying to Sit Supervision/Verbal cueing   Educated log rolling     Exercises   Exercises Knee/Hip      Knee/Hip Exercises: Supine   Other Supine Knee/Hip Exercises deep breathing; hands on chest and stomach (trying to make stomach raise, not chest)    Other Supine Knee/Hip Exercises ab sets with exhale 10x 5"  PT Education - 09/22/19 1800    Education Details Reviewed goals, educated importance of HEP compliance that was established this session.  Pt educated on deep breathing, importance of posture and core activation to support lumbar with verbal and tactile cueing.    Person(s) Educated Patient    Methods Explanation;Demonstration;Tactile cues;Verbal cues;Handout    Comprehension Verbalized understanding;Returned demonstration;Verbal cues required;Tactile cues required;Need further instruction            PT Short Term Goals - 09/19/19 1503      PT SHORT TERM GOAL #1   Title Patient will be independent in self management strategies to improve quality of life and functional outcomes.    Time 3    Period Weeks    Status New    Target Date 10/10/19      PT SHORT TERM GOAL #2   Title Patient will report at least 25% improvement in overall symptoms and/or function to demonstrate improved functional mobility    Time 3    Period Weeks    Status New    Target Date 10/10/19             PT Long Term Goals - 09/19/19 1503      PT LONG TERM GOAL #1    Title Patient will improve on FOTO score to meet predicted outcomes to demonstrate improved functional mobility.    Time 6    Period Weeks    Status New    Target Date 10/31/19      PT LONG TERM GOAL #2   Title Patient will be able to ambulate at least 200 feet with RW in 2 minutes to demonstrate improved walking ability.    Time 6    Period Weeks    Status New    Target Date 10/31/19      PT LONG TERM GOAL #3   Title Patient will be able to perform 5x STS in <35 seconds to demonstrate improved functional strength    Time 6    Period Weeks    Status New    Target Date 10/31/19      PT LONG TERM GOAL #4   Title Patient will be able to ascend and descend stairs with reciprocal gait pattern and use of railing with SBA to improve ability to go up stairs at home.    Time 6    Period Weeks    Status New    Target Date 10/31/19                 Plan - 09/22/19 1835    Clinical Impression Statement Reviewed goals, educated importance of HEP compliance that was established this session.  Pt limited by LBP this session, impaired vision, decreased ROM/pain with Lt knee, and presents with slow cautious cadence.  Pt encouraged to ambulate with RW to assist wtih gait mechanics and support for lower back, verbalized understanding.  Therex focus on core activation for lower back relief.  Presents with shallow breathing that was difficult.  Added deep breathing, core activaiton, seated posture awareness and scapular retraction to HEP.  EOS reports pain reduced to 5/10 (was 8/10 initially).    Personal Factors and Comorbidities Comorbidity 1;Comorbidity 2;Comorbidity 3+    Comorbidities cva, tremor, back pain, neck and back surgery    Examination-Activity Limitations Bathing;Bed Mobility;Caring for Others;Dressing;Transfers;Stand;Stairs;Squat;Sit    Examination-Participation Restrictions Cleaning;Community Activity    Stability/Clinical Decision Making Stable/Uncomplicated    Clinical  Decision Making Low    Rehab Potential  Good    PT Frequency 2x / week   8 visits over 6 week   PT Duration Other (comment)   over 10 weeks period   PT Treatment/Interventions ADLs/Self Care Home Management;Aquatic Therapy;Cryotherapy;Electrical Stimulation;Moist Heat;Traction;Balance training;Therapeutic exercise;Therapeutic activities;Functional mobility training;Stair training;Gait training;DME Instruction;Neuromuscular re-education;Patient/family education;Manual techniques;Energy conservation;Passive range of motion;Joint Manipulations    PT Next Visit Plan Review HEP compliance.  Progress functional strenghtening and balance as able.  Requested pt bring RW with her next session.  Continue core strengthening for back pain.    PT Home Exercise Plan 09/22/19: deep breathing, core activation, seated posture awareness and scapular retraction.    Consulted and Agree with Plan of Care Patient           Patient will benefit from skilled therapeutic intervention in order to improve the following deficits and impairments:  Abnormal gait, Decreased endurance, Decreased strength, Decreased activity tolerance, Decreased knowledge of precautions, Decreased knowledge of use of DME, Decreased balance, Difficulty walking, Decreased range of motion, Pain  Visit Diagnosis: Difficulty in walking, not elsewhere classified  Muscle weakness (generalized)  Chronic midline low back pain without sciatica     Problem List Patient Active Problem List   Diagnosis Date Noted  . Essential tremor 08/15/2019  . Cerebrovascular accident (CVA) ([place]) 08/15/2019  . Gait abnormality 08/15/2019   Ihor Austin, LPTA/CLT; CBIS [phone]  Aldona Lento 09/22/2019, 6:49 PM  [facility] [address] [place], [place], [address] Phone: [phone]   Fax:  [phone]  Name: Monique Robles MRN: [id number] Date of Birth: 1940/09/29

## 2019-09-22 NOTE — Patient Instructions (Signed)
Deep Breathing    Place hands on chest and stomach and take a deep breath, filling diaphragm. Feel hands move out in stomach not chest. Exhale fully and feel hands move in. Repeat deep breaths 10 times every hours. Do 6 session per day.  http://gt2.exer.us/580   Copyright  VHI. All rights reserved.   Posture - Sitting    Sit upright, head facing forward. Try using a roll to support lower back. Keep shoulders relaxed, and avoid rounded back. Keep hips level with knees. Avoid crossing legs for long periods.   Isometric Abdominal    Place one hand on chest the other on stomach.  Breath in and swell up belly and as you exhale tighten stomach.  Hold 3-5 seconds.   Do not hold breath! Repeat 10 times per set. Do 1-2 sets per day.  Scapular Retraction: Elbow Flexion (Standing)    With elbows bent to 90, pinch shoulder blades together and rotate arms out, keeping elbows bent. Repeat 10 times per set. Do 2 sets per day.  http://orth.exer.us/949   Copyright  VHI. All rights reserved.

## 2019-10-11 ENCOUNTER — Other Ambulatory Visit: Payer: Self-pay

## 2019-10-11 ENCOUNTER — Encounter (HOSPITAL_COMMUNITY): Payer: Self-pay | Admitting: Physical Therapy

## 2019-10-11 ENCOUNTER — Ambulatory Visit (HOSPITAL_COMMUNITY): Payer: Medicare Other | Attending: Neurology | Admitting: Physical Therapy

## 2019-10-11 DIAGNOSIS — M6281 Muscle weakness (generalized): Secondary | ICD-10-CM | POA: Diagnosis not present

## 2019-10-11 DIAGNOSIS — R262 Difficulty in walking, not elsewhere classified: Secondary | ICD-10-CM | POA: Insufficient documentation

## 2019-10-11 DIAGNOSIS — M545 Low back pain, unspecified: Secondary | ICD-10-CM

## 2019-10-11 DIAGNOSIS — G8929 Other chronic pain: Secondary | ICD-10-CM

## 2019-10-11 NOTE — Therapy (Signed)
[facility] [address] [place], [place], [address] Phone: [phone]   Fax:  [phone]  Physical Therapy Treatment  Patient Details  Name: Monique Robles MRN: [id number] Date of Birth: 25-Jan-1941 Referring Provider (PT): Marcial Pacas    Encounter Date: 10/11/2019   PT End of Session - 10/11/19 1616    Visit Number 3    Number of Visits 8    Date for PT Re-Evaluation 10/24/19    Authorization Type medicare, no auth , no VL    Progress Note Due on Visit 10    PT Start Time 0354    PT Stop Time 1655    PT Time Calculation (min) 40 min    Equipment Utilized During Treatment Gait belt    Activity Tolerance Patient limited by pain;Patient limited by fatigue;No increased pain    Behavior During Therapy WFL for tasks assessed/performed           Past Medical History:  Diagnosis Date  . Aortic insufficiency   . Atrial fibrillation ([place])   . Diastolic dysfunction   . DOE (dyspnea on exertion)   . GERD (gastroesophageal reflux disease)   . History of pneumonia   . History of pulmonary embolism   . History of stroke   . HTN (hypertension)   . Hyperlipemia   . Mitral valve regurgitation   . Osteoporosis   . Tremor   . Vitamin D deficiency     Past Surgical History:  Procedure Laterality Date  . BLADDER SURGERY     x 3  . CERVICAL SPINE SURGERY    . CHOLECYSTECTOMY    . COLONOSCOPY    . COLONOSCOPY WITH ESOPHAGOGASTRODUODENOSCOPY (EGD)    . LUMBAR SPINE SURGERY    . REPLACEMENT TOTAL KNEE Left   . RIGHT LUNG WEDGE REMOVAL    . TONSILLECTOMY      There were no vitals filed for this visit.   Subjective Assessment - 10/11/19 1620    Subjective States that her back is really painful and she has numbness and tingling in both feet. STates she normally takes her gabapetin but when she goes out she has to go to the bathroom more so she doesn't take it when she leaves.    Pertinent History bilateral peripherial field loss, LBP     Patient Stated Goals to be able to walk better, improved balance, go up and down stairs, have less back pain    Currently in Pain? Yes    Pain Score 9     Pain Location Back    Pain Orientation Lower;Mid    Pain Descriptors / Indicators Aching;Tingling;Numbness    Pain Type Chronic pain    Pain Onset More than a month ago              [facility] PT Assessment - 10/11/19 0001      Assessment   Medical Diagnosis tremor, ataxia, gait disturbance                         [facility] Adult PT Treatment/Exercise - 10/11/19 0001      Ambulation/Gait   Ambulation/Gait Yes    Ambulation/Gait Assistance 5: Supervision    Ambulation/Gait Assistance Details 34    Assistive device Rolling walker    Gait Pattern Decreased arm swing - right;Decreased arm swing - left;Decreased step length - right;Decreased step length - left;Decreased hip/knee flexion - right;Decreased hip/knee flexion - left;Trunk flexed;Wide base of  support    Ambulation Surface Level;Indoor    Gait Comments x6 laps cues to stand upright and to stay in walker and to keep shoulder down.       Knee/Hip Exercises: Seated   Other Seated Knee/Hip Exercises seated posture exercises on dyno disc - static posture and rythmic perturbations- 5" holds - 12 minute total       Manual Therapy   Manual Therapy Soft tissue mobilization    Manual therapy comments all manual interventions performed independently of other interventions    Soft tissue mobilization IASTM to bilateral lumbar/thoracic paraspinals and calves with percussion gun - tolerated well -16 minutes                  PT Education - 10/11/19 1645    Education Details Educated patient on importance of always using RW(doesn't use when she is rushing to the bathroom).    Person(s) Educated Patient    Methods Explanation    Comprehension Verbalized understanding            PT Short Term Goals - 09/19/19 1503      PT SHORT TERM GOAL #1   Title Patient  will be independent in self management strategies to improve quality of life and functional outcomes.    Time 3    Period Weeks    Status New    Target Date 10/10/19      PT SHORT TERM GOAL #2   Title Patient will report at least 25% improvement in overall symptoms and/or function to demonstrate improved functional mobility    Time 3    Period Weeks    Status New    Target Date 10/10/19             PT Long Term Goals - 09/19/19 1503      PT LONG TERM GOAL #1   Title Patient will improve on FOTO score to meet predicted outcomes to demonstrate improved functional mobility.    Time 6    Period Weeks    Status New    Target Date 10/31/19      PT LONG TERM GOAL #2   Title Patient will be able to ambulate at least 200 feet with RW in 2 minutes to demonstrate improved walking ability.    Time 6    Period Weeks    Status New    Target Date 10/31/19      PT LONG TERM GOAL #3   Title Patient will be able to perform 5x STS in <35 seconds to demonstrate improved functional strength    Time 6    Period Weeks    Status New    Target Date 10/31/19      PT LONG TERM GOAL #4   Title Patient will be able to ascend and descend stairs with reciprocal gait pattern and use of railing with SBA to improve ability to go up stairs at home.    Time 6    Period Weeks    Status New    Target Date 10/31/19                 Plan - 10/11/19 1702    Clinical Impression Statement Performed percussion gun lightly on back and patient reported it didn't feel as tight. Performed on legs to gate numbness/tingling with vibration and patient reported a little improvement. Added seated core exercises which helped with back pain. Cued patient for posture and staying in walker with walking in clinic.  Reduced pain noted end of session, 8/10, patient reported she felt this was a lot better as she has been in constant intense pain. Will continue to focus on core and lower extremity strength and walking as  tolerated.    Personal Factors and Comorbidities Comorbidity 1;Comorbidity 2;Comorbidity 3+    Comorbidities cva, tremor, back pain, neck and back surgery    Examination-Activity Limitations Bathing;Bed Mobility;Caring for Others;Dressing;Transfers;Stand;Stairs;Squat;Sit    Examination-Participation Restrictions Cleaning;Community Activity    Stability/Clinical Decision Making Stable/Uncomplicated    Rehab Potential Good    PT Frequency 2x / week   8 visits over 6 week   PT Duration Other (comment)   over 10 weeks period   PT Treatment/Interventions ADLs/Self Care Home Management;Aquatic Therapy;Cryotherapy;Electrical Stimulation;Moist Heat;Traction;Balance training;Therapeutic exercise;Therapeutic activities;Functional mobility training;Stair training;Gait training;DME Instruction;Neuromuscular re-education;Patient/family education;Manual techniques;Energy conservation;Passive range of motion;Joint Manipulations    PT Next Visit Plan Progress functional strenghtening and balance as able.  Requested pt bring RW with her next session.  Continue core strengthening for back pain.    PT Home Exercise Plan 09/22/19: deep breathing, core activation, seated posture awareness and scapular retraction.    Consulted and Agree with Plan of Care Patient           Patient will benefit from skilled therapeutic intervention in order to improve the following deficits and impairments:  Abnormal gait, Decreased endurance, Decreased strength, Decreased activity tolerance, Decreased knowledge of precautions, Decreased knowledge of use of DME, Decreased balance, Difficulty walking, Decreased range of motion, Pain  Visit Diagnosis: Difficulty in walking, not elsewhere classified  Muscle weakness (generalized)  Chronic midline low back pain without sciatica     Problem List Patient Active Problem List   Diagnosis Date Noted  . Essential tremor 08/15/2019  . Cerebrovascular accident (CVA) ([place]) 08/15/2019    . Gait abnormality 08/15/2019   5:02 PM, 10/11/19 [name], DPT Physical Therapy with [facility]  [phone] office  [facility] [address] [place], [place], [address] Phone: [phone]   Fax:  [phone]  Name: Monique Robles MRN: [id number] Date of Birth: [birth year]

## 2019-10-13 ENCOUNTER — Other Ambulatory Visit: Payer: Self-pay

## 2019-10-13 ENCOUNTER — Ambulatory Visit (HOSPITAL_COMMUNITY): Payer: Medicare Other | Admitting: Physical Therapy

## 2019-10-13 DIAGNOSIS — G8929 Other chronic pain: Secondary | ICD-10-CM

## 2019-10-13 DIAGNOSIS — M545 Low back pain: Secondary | ICD-10-CM | POA: Diagnosis not present

## 2019-10-13 DIAGNOSIS — M6281 Muscle weakness (generalized): Secondary | ICD-10-CM | POA: Diagnosis not present

## 2019-10-13 DIAGNOSIS — R262 Difficulty in walking, not elsewhere classified: Secondary | ICD-10-CM | POA: Diagnosis not present

## 2019-10-13 NOTE — Therapy (Signed)
[facility] [address] [place], [place], [address] Phone: [phone]   Fax:  [phone]  Physical Therapy Treatment  Patient Details  Name: Monique Robles MRN: [id number] Date of Birth: Jul 16, 1940 Referring Provider (PT): Marcial Pacas    Encounter Date: 10/13/2019   PT End of Session - 10/13/19 1711    Visit Number 4    Number of Visits 8    Date for PT Re-Evaluation 10/24/19    Authorization Type medicare, no auth , no VL    Progress Note Due on Visit 10    PT Start Time 1622    PT Stop Time 1708    PT Time Calculation (min) 46 min    Equipment Utilized During Treatment Gait belt    Activity Tolerance Patient limited by pain;Patient limited by fatigue;No increased pain    Behavior During Therapy WFL for tasks assessed/performed           Past Medical History:  Diagnosis Date  . Aortic insufficiency   . Atrial fibrillation ([place])   . Diastolic dysfunction   . DOE (dyspnea on exertion)   . GERD (gastroesophageal reflux disease)   . History of pneumonia   . History of pulmonary embolism   . History of stroke   . HTN (hypertension)   . Hyperlipemia   . Mitral valve regurgitation   . Osteoporosis   . Tremor   . Vitamin D deficiency     Past Surgical History:  Procedure Laterality Date  . BLADDER SURGERY     x 3  . CERVICAL SPINE SURGERY    . CHOLECYSTECTOMY    . COLONOSCOPY    . COLONOSCOPY WITH ESOPHAGOGASTRODUODENOSCOPY (EGD)    . LUMBAR SPINE SURGERY    . REPLACEMENT TOTAL KNEE Left   . RIGHT LUNG WEDGE REMOVAL    . TONSILLECTOMY      There were no vitals filed for this visit.   Subjective Assessment - 10/13/19 1628    Subjective pt states her pain is 10/10 but denies need to go to ED.  STates her pain never gets better in her neck or back.    Currently in Pain? Yes    Pain Score 10-Worst pain ever    Pain Location Back    Pain Orientation Lower;Mid    Pain Descriptors / Indicators Aching;Sore;Numbness      Pain Type Chronic pain                             [facility] Adult PT Treatment/Exercise - 10/13/19 0001      Knee/Hip Exercises: Stretches   Other Knee/Hip Stretches lower trunk rotation 5 reps each      Knee/Hip Exercises: Seated   Other Seated Knee/Hip Exercises sitting tall with breathing 1 minute 2 times    Other Seated Knee/Hip Exercises scapular retractions 10X, thoracic excursions without UE movements 5 reps      Knee/Hip Exercises: Supine   Heel Slides Both;5 reps    Other Supine Knee/Hip Exercises decompression 1-5 5 reps each                    PT Short Term Goals - 09/19/19 1503      PT SHORT TERM GOAL #1   Title Patient will be independent in self management strategies to improve quality of life and functional outcomes.    Time 3    Period Weeks  Status New    Target Date 10/10/19      PT SHORT TERM GOAL #2   Title Patient will report at least 25% improvement in overall symptoms and/or function to demonstrate improved functional mobility    Time 3    Period Weeks    Status New    Target Date 10/10/19             PT Long Term Goals - 09/19/19 1503      PT LONG TERM GOAL #1   Title Patient will improve on FOTO score to meet predicted outcomes to demonstrate improved functional mobility.    Time 6    Period Weeks    Status New    Target Date 10/31/19      PT LONG TERM GOAL #2   Title Patient will be able to ambulate at least 200 feet with RW in 2 minutes to demonstrate improved walking ability.    Time 6    Period Weeks    Status New    Target Date 10/31/19      PT LONG TERM GOAL #3   Title Patient will be able to perform 5x STS in <35 seconds to demonstrate improved functional strength    Time 6    Period Weeks    Status New    Target Date 10/31/19      PT LONG TERM GOAL #4   Title Patient will be able to ascend and descend stairs with reciprocal gait pattern and use of railing with SBA to improve ability to go up  stairs at home.    Time 6    Period Weeks    Status New    Target Date 10/31/19                 Plan - 10/13/19 1712    Clinical Impression Statement Began session with upright posturing and breathing techniques and added decompression exercises due to high pain level.  Pt very stiff/rigid due to immobility.  Began gentle mobilizations including thoracic excursions, lower trunk rotations and heelsidess.  Pt able to complete but only 5 reps due to fatigue.  Pt required manual and verbal cues with all exercises.  Pt given decompression 1-5 to complete at home.  No change in pain reports at end of session.    Personal Factors and Comorbidities Comorbidity 1;Comorbidity 2;Comorbidity 3+    Comorbidities cva, tremor, back pain, neck and back surgery    Examination-Activity Limitations Bathing;Bed Mobility;Caring for Others;Dressing;Transfers;Stand;Stairs;Squat;Sit    Examination-Participation Restrictions Cleaning;Community Activity    Stability/Clinical Decision Making Stable/Uncomplicated    Rehab Potential Good    PT Frequency 2x / week   8 visits over 6 week   PT Duration Other (comment)   over 10 weeks period   PT Treatment/Interventions ADLs/Self Care Home Management;Aquatic Therapy;Cryotherapy;Electrical Stimulation;Moist Heat;Traction;Balance training;Therapeutic exercise;Therapeutic activities;Functional mobility training;Stair training;Gait training;DME Instruction;Neuromuscular re-education;Patient/family education;Manual techniques;Energy conservation;Passive range of motion;Joint Manipulations    PT Next Visit Plan Progress functional strenghtening and balance as able.  Continue core strengthening for back pain and encourage increased mobility.    PT Home Exercise Plan 09/22/19: deep breathing, core activation, seated posture awareness and scapular retraction.    Consulted and Agree with Plan of Care Patient           Patient will benefit from skilled therapeutic intervention  in order to improve the following deficits and impairments:  Abnormal gait, Decreased endurance, Decreased strength, Decreased activity tolerance, Decreased knowledge of precautions, Decreased knowledge  of use of DME, Decreased balance, Difficulty walking, Decreased range of motion, Pain  Visit Diagnosis: Difficulty in walking, not elsewhere classified  Muscle weakness (generalized)  Chronic midline low back pain without sciatica     Problem List Patient Active Problem List   Diagnosis Date Noted  . Essential tremor 08/15/2019  . Cerebrovascular accident (CVA) ([place]) 08/15/2019  . Gait abnormality 08/15/2019   Teena Irani, PTA/CLT [phone]  Teena Irani 10/13/2019, 5:16 PM  [facility] [address] [place], [place], [address] Phone: [phone]   Fax:  [phone]  Name: Monique Robles MRN: [id number] Date of Birth: [birth year]

## 2019-10-17 ENCOUNTER — Encounter (HOSPITAL_COMMUNITY): Payer: Self-pay | Admitting: Physical Therapy

## 2019-10-17 ENCOUNTER — Ambulatory Visit (HOSPITAL_COMMUNITY): Payer: Medicare Other | Admitting: Physical Therapy

## 2019-10-17 ENCOUNTER — Other Ambulatory Visit: Payer: Self-pay

## 2019-10-17 DIAGNOSIS — R262 Difficulty in walking, not elsewhere classified: Secondary | ICD-10-CM | POA: Diagnosis not present

## 2019-10-17 DIAGNOSIS — M545 Low back pain, unspecified: Secondary | ICD-10-CM

## 2019-10-17 DIAGNOSIS — G8929 Other chronic pain: Secondary | ICD-10-CM | POA: Diagnosis not present

## 2019-10-17 DIAGNOSIS — M6281 Muscle weakness (generalized): Secondary | ICD-10-CM | POA: Diagnosis not present

## 2019-10-17 NOTE — Therapy (Signed)
[facility] [place], [place], [address] Phone: [phone]   Fax:  [phone]  Physical Therapy Treatment  Patient Details  Name: Monique Robles MRN: [id number] Date of Birth: [birth year] Referring Provider (PT): Marcial Pacas    Encounter Date: 10/17/2019   PT End of Session - 10/17/19 1616    Visit Number 5    Number of Visits 8    Date for PT Re-Evaluation 10/24/19    Authorization Type medicare, no auth , no VL    Progress Note Due on Visit 10    PT Start Time 2778    PT Stop Time 1654    PT Time Calculation (min) 39 min    Equipment Utilized During Treatment Gait belt    Activity Tolerance Patient limited by pain;Patient limited by fatigue;No increased pain    Behavior During Therapy WFL for tasks assessed/performed           Past Medical History:  Diagnosis Date   Aortic insufficiency    Atrial fibrillation ([facility])    Diastolic dysfunction    DOE (dyspnea on exertion)    GERD (gastroesophageal reflux disease)    History of pneumonia    History of pulmonary embolism    History of stroke    HTN (hypertension)    Hyperlipemia    Mitral valve regurgitation    Osteoporosis    Tremor    Vitamin D deficiency     Past Surgical History:  Procedure Laterality Date   BLADDER SURGERY     x 3   CERVICAL SPINE SURGERY     CHOLECYSTECTOMY     COLONOSCOPY     COLONOSCOPY WITH ESOPHAGOGASTRODUODENOSCOPY (EGD)     LUMBAR SPINE SURGERY     REPLACEMENT TOTAL KNEE Left    RIGHT LUNG WEDGE REMOVAL     TONSILLECTOMY      There were no vitals filed for this visit.   Subjective Assessment - 10/17/19 1619    Subjective States she feels about the same. Reports her allergies are bothering her. Reports 9/10 pain currently in her back and through her left hip area.    Currently in Pain? Yes    Pain Score 9     Pain Location Back    Pain Orientation Lower    Pain Descriptors / Indicators [facility] PT Assessment - 10/17/19 0001      Assessment   Medical Diagnosis tremor, ataxia, gait disturbance                         [facility] Adult PT Treatment/Exercise - 10/17/19 0001      Knee/Hip Exercises: Standing   Other Standing Knee Exercises fowards and backwards walking in RW - x5 laps of 20 feet - rest breaks om netweme as needed       Knee/Hip Exercises: Seated   Other Seated Knee/Hip Exercises seated posture exercises x5 10" holds     Sit to Sand 5 reps   hands on thighs - 4 sets, black foam on chair     Manual Therapy   Manual Therapy Soft tissue mobilization    Manual therapy comments all manual interventions performed independently of other interventions    Soft tissue mobilization with hands/percussion gun with towel as barrier - too painful today stopped  PT Education - 10/17/19 1654    Education Details on HEP, reviewed and printed off decompression exercises with larger print.    Person(s) Educated Patient    Methods Explanation            PT Short Term Goals - 09/19/19 1503      PT SHORT TERM GOAL #1   Title Patient will be independent in self management strategies to improve quality of life and functional outcomes.    Time 3    Period Weeks    Status New    Target Date 10/10/19      PT SHORT TERM GOAL #2   Title Patient will report at least 25% improvement in overall symptoms and/or function to demonstrate improved functional mobility    Time 3    Period Weeks    Status New    Target Date 10/10/19             PT Long Term Goals - 09/19/19 1503      PT LONG TERM GOAL #1   Title Patient will improve on FOTO score to meet predicted outcomes to demonstrate improved functional mobility.    Time 6    Period Weeks    Status New    Target Date 10/31/19      PT LONG TERM GOAL #2   Title Patient will be able to ambulate at least 200 feet with RW in 2 minutes to demonstrate improved walking  ability.    Time 6    Period Weeks    Status New    Target Date 10/31/19      PT LONG TERM GOAL #3   Title Patient will be able to perform 5x STS in <35 seconds to demonstrate improved functional strength    Time 6    Period Weeks    Status New    Target Date 10/31/19      PT LONG TERM GOAL #4   Title Patient will be able to ascend and descend stairs with reciprocal gait pattern and use of railing with SBA to improve ability to go up stairs at home.    Time 6    Period Weeks    Status New    Target Date 10/31/19                 Plan - 10/17/19 1658    Clinical Impression Statement Focused on standing and seated exercises. Tolerated moderately well but fatigue and pain reported. Allowed for rest breaks. Pain with soft tissue mobilization so this was not performed today. Printed off decompression exercises with larger print per patient request. Will continue to work on functional strength and endurance as tolerated by patient.    Personal Factors and Comorbidities Comorbidity 1;Comorbidity 2;Comorbidity 3+    Comorbidities cva, tremor, back pain, neck and back surgery    Examination-Activity Limitations Bathing;Bed Mobility;Caring for Others;Dressing;Transfers;Stand;Stairs;Squat;Sit    Examination-Participation Restrictions Cleaning;Community Activity    Stability/Clinical Decision Making Stable/Uncomplicated    Rehab Potential Good    PT Frequency 2x / week   8 visits over 6 week   PT Duration Other (comment)   over 10 weeks period   PT Treatment/Interventions ADLs/Self Care Home Management;Aquatic Therapy;Cryotherapy;Electrical Stimulation;Moist Heat;Traction;Balance training;Therapeutic exercise;Therapeutic activities;Functional mobility training;Stair training;Gait training;DME Instruction;Neuromuscular re-education;Patient/family education;Manual techniques;Energy conservation;Passive range of motion;Joint Manipulations    PT Next Visit Plan Progress functional  strenghtening and balance as able.  Continue core strengthening for back pain and encourage increased mobility.    PT  Home Exercise Plan 09/22/19: deep breathing, core activation, seated posture awareness and scapular retraction.    Consulted and Agree with Plan of Care Patient           Patient will benefit from skilled therapeutic intervention in order to improve the following deficits and impairments:  Abnormal gait, Decreased endurance, Decreased strength, Decreased activity tolerance, Decreased knowledge of precautions, Decreased knowledge of use of DME, Decreased balance, Difficulty walking, Decreased range of motion, Pain  Visit Diagnosis: Difficulty in walking, not elsewhere classified  Muscle weakness (generalized)  Chronic midline low back pain without sciatica     Problem List Patient Active Problem List   Diagnosis Date Noted   Essential tremor 08/15/2019   Cerebrovascular accident (CVA) ([place]) 08/15/2019   Gait abnormality 08/15/2019    4:59 PM, 10/17/19 [name], DPT Physical Therapy with [facility]  [phone] office  [facility] [address] [place], [place], [address] Phone: [phone]   Fax:  [phone]  Name: Monique Robles MRN: [id number] Date of Birth: 01-03-1941

## 2019-10-18 ENCOUNTER — Telehealth (HOSPITAL_COMMUNITY): Payer: Self-pay | Admitting: Physical Therapy

## 2019-10-20 ENCOUNTER — Ambulatory Visit (HOSPITAL_COMMUNITY): Payer: Medicare Other | Admitting: Physical Therapy

## 2019-10-20 ENCOUNTER — Other Ambulatory Visit: Payer: Self-pay

## 2019-10-20 DIAGNOSIS — H6123 Impacted cerumen, bilateral: Secondary | ICD-10-CM | POA: Diagnosis not present

## 2019-10-20 DIAGNOSIS — M545 Low back pain, unspecified: Secondary | ICD-10-CM

## 2019-10-20 DIAGNOSIS — M6281 Muscle weakness (generalized): Secondary | ICD-10-CM | POA: Diagnosis not present

## 2019-10-20 DIAGNOSIS — G8929 Other chronic pain: Secondary | ICD-10-CM

## 2019-10-20 DIAGNOSIS — R262 Difficulty in walking, not elsewhere classified: Secondary | ICD-10-CM

## 2019-10-20 DIAGNOSIS — K118 Other diseases of salivary glands: Secondary | ICD-10-CM | POA: Diagnosis not present

## 2019-10-20 NOTE — Therapy (Addendum)
[facility] [place], [place], [address] Phone: [phone]   Fax:  [phone]  Physical Therapy Treatment  Patient Details  Name: Monique Robles MRN: [id number] Date of Birth: Feb 21, 1941 Referring Provider (PT): Marcial Pacas    Encounter Date: 10/20/2019   PT End of Session - 10/20/19 1711    Visit Number 6    Number of Visits 8    Date for PT Re-Evaluation 10/24/19    Authorization Type medicare, no auth , no VL    Progress Note Due on Visit 10    PT Start Time 1622    PT Stop Time 1705    PT Time Calculation (min) 43 min    Equipment Utilized During Treatment Gait belt    Activity Tolerance Patient limited by pain;Patient limited by fatigue;No increased pain    Behavior During Therapy WFL for tasks assessed/performed           Past Medical History:  Diagnosis Date  . Aortic insufficiency   . Atrial fibrillation ([place])   . Diastolic dysfunction   . DOE (dyspnea on exertion)   . GERD (gastroesophageal reflux disease)   . History of pneumonia   . History of pulmonary embolism   . History of stroke   . HTN (hypertension)   . Hyperlipemia   . Mitral valve regurgitation   . Osteoporosis   . Tremor   . Vitamin D deficiency     Past Surgical History:  Procedure Laterality Date  . BLADDER SURGERY     x 3  . CERVICAL SPINE SURGERY    . CHOLECYSTECTOMY    . COLONOSCOPY    . COLONOSCOPY WITH ESOPHAGOGASTRODUODENOSCOPY (EGD)    . LUMBAR SPINE SURGERY    . REPLACEMENT TOTAL KNEE Left   . RIGHT LUNG WEDGE REMOVAL    . TONSILLECTOMY      There were no vitals filed for this visit.   Subjective Assessment - 10/20/19 1630    Subjective pt states her pain is 8/10 currently.  States she went to ENT today about her allergies and still with no relief.    Currently in Pain? Yes    Pain Score 8     Pain Location Back    Pain Orientation Lower    Pain Type Chronic pain                              [facility] Adult PT Treatment/Exercise - 10/20/19 0001      Knee/Hip Exercises: Standing   Stairs 4" steps with bil UE 2RT with 2HR step to      Knee/Hip Exercises: Seated   Other Seated Knee/Hip Exercises seated lumbar extensions 5X    Other Seated Knee/Hip Exercises scapular retractions 10X, thoracic excursions with UE movements 5 reps    Sit to Sand 2 sets;5 reps;without UE support                    PT Short Term Goals - 09/19/19 1503      PT SHORT TERM GOAL #1   Title Patient will be independent in self management strategies to improve quality of life and functional outcomes.    Time 3    Period Weeks    Status New    Target Date 10/10/19      PT SHORT TERM GOAL #2   Title Patient will report at least 25% improvement in overall  symptoms and/or function to demonstrate improved functional mobility    Time 3    Period Weeks    Status New    Target Date 10/10/19             PT Long Term Goals - 09/19/19 1503      PT LONG TERM GOAL #1   Title Patient will improve on FOTO score to meet predicted outcomes to demonstrate improved functional mobility.    Time 6    Period Weeks    Status New    Target Date 10/31/19      PT LONG TERM GOAL #2   Title Patient will be able to ambulate at least 200 feet with RW in 2 minutes to demonstrate improved walking ability.    Time 6    Period Weeks    Status New    Target Date 10/31/19      PT LONG TERM GOAL #3   Title Patient will be able to perform 5x STS in <35 seconds to demonstrate improved functional strength    Time 6    Period Weeks    Status New    Target Date 10/31/19      PT LONG TERM GOAL #4   Title Patient will be able to ascend and descend stairs with reciprocal gait pattern and use of railing with SBA to improve ability to go up stairs at home.    Time 6    Period Weeks    Status New    Target Date 10/31/19                 Plan - 10/20/19 1711     Clinical Impression Statement Continued with focus on improving lumbar stability and reducing pain. Added in UE movements with thoracic excursions with descent ROM and no pain voiced.  Pt requested to work on stairs this session with ability to complete in a step-to pattern with 1 HR.  Unable to complete greater than 4" step without difficulty but will progress to normalized step.    Personal Factors and Comorbidities Comorbidity 1;Comorbidity 2;Comorbidity 3+    Comorbidities cva, tremor, back pain, neck and back surgery    Examination-Activity Limitations Bathing;Bed Mobility;Caring for Others;Dressing;Transfers;Stand;Stairs;Squat;Sit    Examination-Participation Restrictions Cleaning;Community Activity    Stability/Clinical Decision Making Stable/Uncomplicated    Rehab Potential Good    PT Frequency 2x / week   8 visits over 6 week   PT Duration Other (comment)   over 10 weeks period   PT Treatment/Interventions ADLs/Self Care Home Management;Aquatic Therapy;Cryotherapy;Electrical Stimulation;Moist Heat;Traction;Balance training;Therapeutic exercise;Therapeutic activities;Functional mobility training;Stair training;Gait training;DME Instruction;Neuromuscular re-education;Patient/family education;Manual techniques;Energy conservation;Passive range of motion;Joint Manipulations    PT Next Visit Plan Progress functional strenghtening and balance as able.  Continue core strengthening for back pain and encourage increased mobility.    PT Home Exercise Plan 09/22/19: deep breathing, core activation, seated posture awareness and scapular retraction.    Consulted and Agree with Plan of Care Patient           Patient will benefit from skilled therapeutic intervention in order to improve the following deficits and impairments:  Abnormal gait, Decreased endurance, Decreased strength, Decreased activity tolerance, Decreased knowledge of precautions, Decreased knowledge of use of DME, Decreased balance,  Difficulty walking, Decreased range of motion, Pain  Visit Diagnosis: Difficulty in walking, not elsewhere classified  Muscle weakness (generalized)  Chronic midline low back pain without sciatica     Problem List Patient Active Problem List   Diagnosis  Date Noted  . Essential tremor 08/15/2019  . Cerebrovascular accident (CVA) ([place]) 08/15/2019  . Gait abnormality 08/15/2019   Teena Irani, PTA/CLT [phone]  Teena Irani 10/20/2019, 5:23 PM  [facility] [address] [place], [place], [address] Phone: [phone]   Fax:  [phone]  Name: Monique Robles MRN: [id number] Date of Birth: 1941/02/10

## 2019-10-24 ENCOUNTER — Other Ambulatory Visit: Payer: Self-pay

## 2019-10-24 ENCOUNTER — Ambulatory Visit (HOSPITAL_COMMUNITY): Payer: Medicare Other | Admitting: Physical Therapy

## 2019-10-24 DIAGNOSIS — R262 Difficulty in walking, not elsewhere classified: Secondary | ICD-10-CM

## 2019-10-24 DIAGNOSIS — M6281 Muscle weakness (generalized): Secondary | ICD-10-CM | POA: Diagnosis not present

## 2019-10-24 DIAGNOSIS — M545 Low back pain: Secondary | ICD-10-CM | POA: Diagnosis not present

## 2019-10-24 DIAGNOSIS — G8929 Other chronic pain: Secondary | ICD-10-CM

## 2019-10-24 NOTE — Therapy (Addendum)
[facility] [address] [place], [place], [address] Phone: [phone]   Fax:  [phone]  Physical Therapy Treatment and [facility] Progress Note Reporting Period 09/19/2019 to 10/24/2019  See note below for Objective Data and Assessment of Progress/Goals.     Patient Details  Name: Monique Robles MRN: [id number] Date of Birth: [birth year] Referring Provider (PT): Marcial Pacas    Encounter Date: 10/24/2019   PT End of Session - 10/24/19 1701    Visit Number 7    Number of Visits 16    Date for PT Re-Evaluation 11/22/19    Authorization Type medicare, no auth , no VL    Progress Note Due on Visit 17    PT Start Time 1618    PT Stop Time 1704    PT Time Calculation (min) 46 min    Equipment Utilized During Treatment Gait belt    Activity Tolerance Patient limited by pain;Patient limited by fatigue;No increased pain    Behavior During Therapy WFL for tasks assessed/performed           Past Medical History:  Diagnosis Date  . Aortic insufficiency   . Atrial fibrillation ([place])   . Diastolic dysfunction   . DOE (dyspnea on exertion)   . GERD (gastroesophageal reflux disease)   . History of pneumonia   . History of pulmonary embolism   . History of stroke   . HTN (hypertension)   . Hyperlipemia   . Mitral valve regurgitation   . Osteoporosis   . Tremor   . Vitamin D deficiency     Past Surgical History:  Procedure Laterality Date  . BLADDER SURGERY     x 3  . CERVICAL SPINE SURGERY    . CHOLECYSTECTOMY    . COLONOSCOPY    . COLONOSCOPY WITH ESOPHAGOGASTRODUODENOSCOPY (EGD)    . LUMBAR SPINE SURGERY    . REPLACEMENT TOTAL KNEE Left   . RIGHT LUNG WEDGE REMOVAL    . TONSILLECTOMY      There were no vitals filed for this visit.   Subjective Assessment - 10/24/19 1622    Subjective pt states her pain remains an 8/10 but was 10/10 originally.  States she feels she is only 15% improved.    Currently in Pain? Yes     Pain Score 8     Pain Location Back    Pain Orientation Lower    Pain Descriptors / Indicators Aching    Pain Type Chronic pain              [facility] PT Assessment - 10/24/19 1626      Assessment   Medical Diagnosis tremor, ataxia, gait disturbance    Referring Provider (PT) Marcial Pacas     Prior Therapy yes in rehab       Precautions   Precautions --      Restrictions   Weight Bearing Restrictions --      Home Environment   Living Environment --    Living Arrangements --    Available Help at Discharge --    Type of [place] --    Additional Comments pt lives with her daughter and son-in-law and will remain here indefinetly      Prior Function   Level of Independence Independent with community mobility with device;Needs assistance with ADLs;Needs  assistance with homemaking    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 assessed      Observation/Other Assessments   Observations slumped posture, forward head     Focus on Therapeutic Outcomes (FOTO)  49% limited   was 66% limited     Transfers   Five time sit to stand comments  29.78 seconds    was 48.21 seconds     Ambulation/Gait   Ambulation/Gait Yes    Ambulation/Gait Assistance 4: Min guard    Ambulation/Gait Assistance Details 2 MWT    Ambulation Distance (Feet) 178 Feet   was 95 feet   Assistive device None;Rolling walker    Gait Pattern Decreased arm swing - right;Decreased arm swing - left;Decreased step length - right;Decreased step length - left;Decreased hip/knee flexion - right;Decreased hip/knee flexion - left;Trunk flexed;Wide base of support   increased frontal plane motion    Ambulation Surface Level;Indoor    Gait velocity decreased                          [facility] Adult PT Treatment/Exercise - 10/24/19 1626      Ambulation/Gait    Pre-Gait Activities 2MWT with QC 110 feet    Gait Comments 2MWT, with RW - improved gait noted      Knee/Hip Exercises: Standing   Stairs 7" steps, step to with [place]    Gait Training with SPC X 120 feet                    PT Short Term Goals - 10/24/19 1648      PT SHORT TERM GOAL #1   Title Patient will be independent in self management strategies to improve quality of life and functional outcomes.    Time 3    Period Weeks    Status Partially Met    Target Date 10/10/19      PT SHORT TERM GOAL #2   Title Patient will report at least 25% improvement in overall symptoms and/or function to demonstrate improved functional mobility    Time 3    Period Weeks    Status On-going    Target Date 10/10/19             PT Long Term Goals - 10/24/19 1648      PT LONG TERM GOAL #1   Title Patient will improve on FOTO score to meet predicted outcomes to demonstrate improved functional mobility.    Time 6    Period Weeks    Status Partially Met      PT LONG TERM GOAL #2   Title Patient will be able to ambulate at least 200 feet with RW in 2 minutes to demonstrate improved walking ability.    Time 6    Period Weeks    Status On-going      PT LONG TERM GOAL #3   Title Patient will be able to perform 5x STS in <35 seconds to demonstrate improved functional strength    Time 6    Period Weeks    Status Achieved      PT LONG TERM GOAL #4   Title Patient will be able to ascend and descend stairs with step to gait pattern and use of railing independtly to improve ability to go up stairs at home.    Time 6    Period Weeks    Status Partially  Met                 Plan - 10/24/19 1709    Clinical Impression Statement Re assessment completed today for therapy recertification.  Pt has met 0/2 STG's and 1/4 LTG's.  Pt has partly met 1/2 STG's and 2/4 LTG's.  pt is overall progressing well with improvements in LE power, gait speed and stability as demonstrated with  functional gait and strength values.  Pt is able to complete 7" stairs with step to pattern using 1 HR and supervision and would like to be able to complete this task independently.  Pt would also like to work more on her LE strength and endurance in remaining visits.    Personal Factors and Comorbidities Comorbidity 1;Comorbidity 2;Comorbidity 3+    Comorbidities cva, tremor, back pain, neck and back surgery    Examination-Activity Limitations Bathing;Bed Mobility;Caring for Others;Dressing;Transfers;Stand;Stairs;Squat;Sit    Examination-Participation Restrictions Cleaning;Community Activity    Stability/Clinical Decision Making Stable/Uncomplicated    Rehab Potential Good    PT Frequency 2x / week    PT Duration 4 weeks    PT Treatment/Interventions ADLs/Self Care Home Management;Aquatic Therapy;Cryotherapy;Electrical Stimulation;Moist Heat;Traction;Balance training;Therapeutic exercise;Therapeutic activities;Functional mobility training;Stair training;Gait training;DME Instruction;Neuromuscular re-education;Patient/family education;Manual techniques;Energy conservation;Passive range of motion;Joint Manipulations    PT Next Visit Plan continue therapy with focus on LE strengthening and stability moving forward.  Begin LE strengthening in standing as welll as leg press, gait with SPC and nustep machine at EOS.    PT Home Exercise Plan 09/22/19: deep breathing, core activation, seated posture awareness and scapular retraction.    Consulted and Agree with Plan of Care Patient           Patient will benefit from skilled therapeutic intervention in order to improve the following deficits and impairments:  Abnormal gait, Decreased endurance, Decreased strength, Decreased activity tolerance, Decreased knowledge of precautions, Decreased knowledge of use of DME, Decreased balance, Difficulty walking, Decreased range of motion, Pain  Visit Diagnosis:    ICD-10-CM   PL                   1.    Difficulty in walking, not elsewhere classified R26.2  Change Dx     2.   Muscle weakness (generalized) M62.81  Change Dx     3.   Chronic midline low back pain without sciatica M54.5.Marland KitchenMarland Kitchen  Change Dx        Problem List Patient Active Problem List   Diagnosis Date Noted  . Essential tremor 08/15/2019  . Cerebrovascular accident (CVA) ([place]) 08/15/2019  . Gait abnormality 08/15/2019   Teena Irani, PTA/CLT, WTA [phone]  Roseanne Reno B 10/24/2019, 5:13 PM    3:05 PM, 10/25/19 [name], DPT Physical Therapy with [facility]  [phone] office    [facility] [address] [place], [place], [address] Phone: [phone]   Fax:  [phone]  Name: Monique Robles MRN: [id number] Date of Birth: [birth year]

## 2019-10-25 DIAGNOSIS — I739 Peripheral vascular disease, unspecified: Secondary | ICD-10-CM | POA: Diagnosis not present

## 2019-10-25 DIAGNOSIS — M79672 Pain in left foot: Secondary | ICD-10-CM | POA: Diagnosis not present

## 2019-10-25 DIAGNOSIS — L110 Acquired keratosis follicularis: Secondary | ICD-10-CM | POA: Diagnosis not present

## 2019-10-25 DIAGNOSIS — M79674 Pain in right toe(s): Secondary | ICD-10-CM | POA: Diagnosis not present

## 2019-10-25 DIAGNOSIS — M79671 Pain in right foot: Secondary | ICD-10-CM | POA: Diagnosis not present

## 2019-10-25 NOTE — Addendum Note (Signed)
Addended by: [name] R on: 10/25/2019 03:08 PM   Modules accepted: Orders

## 2019-10-26 ENCOUNTER — Ambulatory Visit (HOSPITAL_COMMUNITY): Payer: Medicare Other | Admitting: Physical Therapy

## 2019-10-26 ENCOUNTER — Other Ambulatory Visit: Payer: Self-pay

## 2019-10-26 DIAGNOSIS — R262 Difficulty in walking, not elsewhere classified: Secondary | ICD-10-CM

## 2019-10-26 DIAGNOSIS — M545 Low back pain: Secondary | ICD-10-CM | POA: Diagnosis not present

## 2019-10-26 DIAGNOSIS — M6281 Muscle weakness (generalized): Secondary | ICD-10-CM | POA: Diagnosis not present

## 2019-10-26 DIAGNOSIS — G8929 Other chronic pain: Secondary | ICD-10-CM | POA: Diagnosis not present

## 2019-10-26 NOTE — Therapy (Signed)
[facility] [place], [place], [address] Phone: [phone]   Fax:  [phone]  Physical Therapy Treatment  Patient Details  Name: Monique Robles MRN: [id number] Date of Birth: 07-23-1940 Referring Provider (PT): Marcial Pacas    Encounter Date: 10/26/2019   PT End of Session - 10/26/19 1738    Visit Number 8    Number of Visits 16    Date for PT Re-Evaluation 11/21/19    Authorization Type medicare, no auth , no VL    Progress Note Due on Visit 17    PT Start Time 1622    PT Stop Time 1708    PT Time Calculation (min) 46 min    Equipment Utilized During Treatment Gait belt    Activity Tolerance Patient limited by pain;Patient limited by fatigue;No increased pain    Behavior During Therapy WFL for tasks assessed/performed           Past Medical History:  Diagnosis Date  . Aortic insufficiency   . Atrial fibrillation ([place])   . Diastolic dysfunction   . DOE (dyspnea on exertion)   . GERD (gastroesophageal reflux disease)   . History of pneumonia   . History of pulmonary embolism   . History of stroke   . HTN (hypertension)   . Hyperlipemia   . Mitral valve regurgitation   . Osteoporosis   . Tremor   . Vitamin D deficiency     Past Surgical History:  Procedure Laterality Date  . BLADDER SURGERY     x 3  . CERVICAL SPINE SURGERY    . CHOLECYSTECTOMY    . COLONOSCOPY    . COLONOSCOPY WITH ESOPHAGOGASTRODUODENOSCOPY (EGD)    . LUMBAR SPINE SURGERY    . REPLACEMENT TOTAL KNEE Left   . RIGHT LUNG WEDGE REMOVAL    . TONSILLECTOMY      There were no vitals filed for this visit.   Subjective Assessment - 10/26/19 1629    Subjective pt states he back is feeling better today at 7/10.  STates she was not sore from all the testing and working on the stairs last visit.    Currently in Pain? Yes    Pain Score 7     Pain Location Back    Pain Orientation Lower    Pain Descriptors / Indicators Aching    Pain  Type Chronic pain                             [facility] Adult PT Treatment/Exercise - 10/26/19 0001      Ambulation/Gait   Pre-Gait Activities gait with QC X 120 feet      Knee/Hip Exercises: Aerobic   Nustep at EOS (N/C) SPM 38-40 UE/LE 5 minutes level 1      Knee/Hip Exercises: Standing   Heel Raises 15 reps    Forward Lunges Both;10 reps;Limitations    Forward Lunges Limitations onto 4" box with 1 UE only to work on stability/control    Hip Abduction Both;10 reps    Hip Extension Both;10 reps    Stairs 7" steps, step to with 1HHA, 4" stairs reciiprocally 2RT with 2 HR      Knee/Hip Exercises: Seated   Other Seated Knee/Hip Exercises seated lumbar extensions 5X                    PT Short Term Goals - 10/24/19 1648  PT SHORT TERM GOAL #1   Title Patient will be independent in self management strategies to improve quality of life and functional outcomes.    Time 3    Period Weeks    Status Partially Met    Target Date 10/10/19      PT SHORT TERM GOAL #2   Title Patient will report at least 25% improvement in overall symptoms and/or function to demonstrate improved functional mobility    Time 3    Period Weeks    Status On-going    Target Date 10/10/19             PT Long Term Goals - 10/24/19 1648      PT LONG TERM GOAL #1   Title Patient will improve on FOTO score to meet predicted outcomes to demonstrate improved functional mobility.    Time 6    Period Weeks    Status Partially Met      PT LONG TERM GOAL #2   Title Patient will be able to ambulate at least 200 feet with RW in 2 minutes to demonstrate improved walking ability.    Time 6    Period Weeks    Status On-going      PT LONG TERM GOAL #3   Title Patient will be able to perform 5x STS in <35 seconds to demonstrate improved functional strength    Time 6    Period Weeks    Status Achieved      PT LONG TERM GOAL #4   Title Patient will be able to ascend and descend  stairs with step to gait pattern and use of railing independtly to improve ability to go up stairs at home.    Time 6    Period Weeks    Status Partially Met                 Plan - 10/26/19 1739    Clinical Impression Statement Progressed with LE strengthening and stability exercises.  Added standing hip abd/ext and lunges.  Able to complete these without rest break between.  continued ambulation with SPC with cues to take larger steps. Nustep completed at EOS with both UE/LE to work on increasing actvitity tolerance.  Pt able to maintain approx 38 SPM during task.  Pt requeisted to try negotiating stairs reciprocally.  Able to complete 4" height using bil UE handrails independently.  Pt without any increased pain during or at conclusion of session.    Personal Factors and Comorbidities Comorbidity 1;Comorbidity 2;Comorbidity 3+    Comorbidities cva, tremor, back pain, neck and back surgery    Examination-Activity Limitations Bathing;Bed Mobility;Caring for Others;Dressing;Transfers;Stand;Stairs;Squat;Sit    Examination-Participation Restrictions Cleaning;Community Activity    Stability/Clinical Decision Making Stable/Uncomplicated    Rehab Potential Good    PT Frequency 2x / week    PT Duration 4 weeks    PT Treatment/Interventions ADLs/Self Care Home Management;Aquatic Therapy;Cryotherapy;Electrical Stimulation;Moist Heat;Traction;Balance training;Therapeutic exercise;Therapeutic activities;Functional mobility training;Stair training;Gait training;DME Instruction;Neuromuscular re-education;Patient/family education;Manual techniques;Energy conservation;Passive range of motion;Joint Manipulations    PT Next Visit Plan continue therapy with focus on LE strengthening and stability moving forward.  Progress LE strengthening in standing as welll as leg press, gait with SPC and nustep machine at EOS.    PT Home Exercise Plan 09/22/19: deep breathing, core activation, seated posture awareness and  scapular retraction.    Consulted and Agree with Plan of Care Patient           Patient will benefit from  skilled therapeutic intervention in order to improve the following deficits and impairments:  Abnormal gait, Decreased endurance, Decreased strength, Decreased activity tolerance, Decreased knowledge of precautions, Decreased knowledge of use of DME, Decreased balance, Difficulty walking, Decreased range of motion, Pain  Visit Diagnosis: Muscle weakness (generalized)  Chronic midline low back pain without sciatica  Difficulty in walking, not elsewhere classified     Problem List Patient Active Problem List   Diagnosis Date Noted  . Essential tremor 08/15/2019  . Cerebrovascular accident (CVA) ([place]) 08/15/2019  . Gait abnormality 08/15/2019   Teena Irani, PTA/CLT [phone]  Teena Irani 10/26/2019, 5:44 PM  [facility] [address] [place], [place], [address] Phone: [phone]   Fax:  [phone]  Name: Monique Robles MRN: [id number] Date of Birth: [birth year]

## 2019-11-01 ENCOUNTER — Ambulatory Visit (HOSPITAL_COMMUNITY): Payer: Medicare Other | Admitting: Physical Therapy

## 2019-11-01 ENCOUNTER — Other Ambulatory Visit: Payer: Self-pay

## 2019-11-01 DIAGNOSIS — R262 Difficulty in walking, not elsewhere classified: Secondary | ICD-10-CM | POA: Diagnosis not present

## 2019-11-01 DIAGNOSIS — G8929 Other chronic pain: Secondary | ICD-10-CM

## 2019-11-01 DIAGNOSIS — M6281 Muscle weakness (generalized): Secondary | ICD-10-CM

## 2019-11-01 DIAGNOSIS — M545 Low back pain: Secondary | ICD-10-CM | POA: Diagnosis not present

## 2019-11-01 NOTE — Therapy (Signed)
Monique Robles, [place], [address] Phone: [phone]   Fax:  [phone]  Physical Therapy Treatment  Patient Details  Name: Monique Robles MRN: [id number] Date of Birth: 1940-10-03 Referring Provider (PT): Marcial Pacas    Encounter Date: 11/01/2019   PT End of Session - 11/01/19 1704    Visit Number 9    Number of Visits 16    Date for PT Re-Evaluation 11/21/19    Authorization Type medicare, no auth , no VL    Progress Note Due on Visit 17    PT Start Time 1620    PT Stop Time 1705    PT Time Calculation (min) 45 min    Equipment Utilized During Treatment Gait belt    Activity Tolerance Patient limited by pain;Patient limited by fatigue;No increased pain    Behavior During Therapy WFL for tasks assessed/performed           Past Medical History:  Diagnosis Date  . Aortic insufficiency   . Atrial fibrillation ([place])   . Diastolic dysfunction   . DOE (dyspnea on exertion)   . GERD (gastroesophageal reflux disease)   . History of pneumonia   . History of pulmonary embolism   . History of stroke   . HTN (hypertension)   . Hyperlipemia   . Mitral valve regurgitation   . Osteoporosis   . Tremor   . Vitamin D deficiency     Past Surgical History:  Procedure Laterality Date  . BLADDER SURGERY     x 3  . CERVICAL SPINE SURGERY    . CHOLECYSTECTOMY    . COLONOSCOPY    . COLONOSCOPY WITH ESOPHAGOGASTRODUODENOSCOPY (EGD)    . LUMBAR SPINE SURGERY    . REPLACEMENT TOTAL KNEE Left   . RIGHT LUNG WEDGE REMOVAL    . TONSILLECTOMY      There were no vitals filed for this visit.   Subjective Assessment - 11/01/19 1704    Subjective pt states she either wants to use the walker or no AD; states she does not like or feel comfortable with the cane.  States she currently has no pain today.                             [name] Adult PT Treatment/Exercise - 11/01/19 0001      Ambulation/Gait    Pre-Gait Activities gait without AD      Knee/Hip Exercises: Aerobic   Nustep at EOS (N/C) SPM 38-40 UE/LE 5 minutes level 1      Knee/Hip Exercises: Standing   Heel Raises 15 reps    Forward Lunges Both;Limitations;15 reps    Forward Lunges Limitations onto 4" box with 1 UE only to work on stability/control    Hip Abduction Both;15 reps    Hip Extension Both;15 reps    Stairs 4" step reciprcoally with 1 HR 3RT    Gait Training no AD today    Other Standing Knee Exercises retro, sidestepping 2RT, tandem 1RT      Knee/Hip Exercises: Seated   Sit to Sand 2 sets;10 reps                    PT Short Term Goals - 10/24/19 1648      PT SHORT TERM GOAL #1   Title Patient will be independent in self management strategies to improve quality of life and functional outcomes.  Time 3    Period Weeks    Status Partially Met    Target Date 10/10/19      PT SHORT TERM GOAL #2   Title Patient will report at least 25% improvement in overall symptoms and/or function to demonstrate improved functional mobility    Time 3    Period Weeks    Status On-going    Target Date 10/10/19             PT Long Term Goals - 10/24/19 1648      PT LONG TERM GOAL #1   Title Patient will improve on FOTO score to meet predicted outcomes to demonstrate improved functional mobility.    Time 6    Period Weeks    Status Partially Met      PT LONG TERM GOAL #2   Title Patient will be able to ambulate at least 200 feet with RW in 2 minutes to demonstrate improved walking ability.    Time 6    Period Weeks    Status On-going      PT LONG TERM GOAL #3   Title Patient will be able to perform 5x STS in <35 seconds to demonstrate improved functional strength    Time 6    Period Weeks    Status Achieved      PT LONG TERM GOAL #4   Title Patient will be able to ascend and descend stairs with step to gait pattern and use of railing independtly to improve ability to go up stairs at home.    Time  6    Period Weeks    Status Partially Met                 Plan - 11/01/19 1705    Clinical Impression Statement continued with exercise progression with focus on LE strength and balance actviites.  Added side stepping, retro gait and tandem with cues for forward gaze and larger steps.  Pt voiced no longer wanting to use the cane and wants to be able to ambulate without AD.  Work on this today with overall good stability and no LOB.    Personal Factors and Comorbidities Comorbidity 1;Comorbidity 2;Comorbidity 3+    Comorbidities cva, tremor, back pain, neck and back surgery    Examination-Activity Limitations Bathing;Bed Mobility;Caring for Others;Dressing;Transfers;Stand;Stairs;Squat;Sit    Examination-Participation Restrictions Cleaning;Community Activity    Stability/Clinical Decision Making Stable/Uncomplicated    Rehab Potential Good    PT Frequency 2x / week    PT Duration 4 weeks    PT Treatment/Interventions ADLs/Self Care Home Management;Aquatic Therapy;Cryotherapy;Electrical Stimulation;Moist Heat;Traction;Balance training;Therapeutic exercise;Therapeutic activities;Functional mobility training;Stair training;Gait training;DME Instruction;Neuromuscular re-education;Patient/family education;Manual techniques;Energy conservation;Passive range of motion;Joint Manipulations    PT Next Visit Plan continue therapy with focus on LE strengthening and stability moving forward.    PT Home Exercise Plan 09/22/19: deep breathing, core activation, seated posture awareness and scapular retraction.    Consulted and Agree with Plan of Care Patient           Patient will benefit from skilled therapeutic intervention in order to improve the following deficits and impairments:  Abnormal gait, Decreased endurance, Decreased strength, Decreased activity tolerance, Decreased knowledge of precautions, Decreased knowledge of use of DME, Decreased balance, Difficulty walking, Decreased range of  motion, Pain  Visit Diagnosis: Chronic midline low back pain without sciatica  Difficulty in walking, not elsewhere classified  Muscle weakness (generalized)     Problem List Patient Active Problem List   Diagnosis  Date Noted  . Essential tremor 08/15/2019  . Cerebrovascular accident (CVA) ([place]) 08/15/2019  . Gait abnormality 08/15/2019   Monique Robles, PTA/CLT [phone]  Monique Robles 11/01/2019, 5:43 PM  [facility] [address] [place], [place], [address] Phone: [phone]   Fax:  [phone]  Name: Hasna Stefanik MRN: [id number] Date of Birth: 1940-10-27

## 2019-11-03 ENCOUNTER — Ambulatory Visit (HOSPITAL_COMMUNITY): Payer: Medicare Other | Admitting: Physical Therapy

## 2019-11-03 ENCOUNTER — Other Ambulatory Visit: Payer: Self-pay

## 2019-11-03 DIAGNOSIS — M6281 Muscle weakness (generalized): Secondary | ICD-10-CM

## 2019-11-03 DIAGNOSIS — M545 Low back pain, unspecified: Secondary | ICD-10-CM

## 2019-11-03 DIAGNOSIS — R262 Difficulty in walking, not elsewhere classified: Secondary | ICD-10-CM

## 2019-11-03 DIAGNOSIS — G8929 Other chronic pain: Secondary | ICD-10-CM | POA: Diagnosis not present

## 2019-11-03 NOTE — Therapy (Addendum)
[facility] [address] [place], [place], [address] Phone: [phone]   Fax:  [phone]  Physical Therapy Treatment and Discharge Note  Patient Details  Name: Monique Robles MRN: [id number] Date of Birth: 07-Dec-1940 Referring Provider (PT): Marcial Pacas   PHYSICAL THERAPY DISCHARGE SUMMARY  Visits from Start of Care: 10  Current functional level related to goals / functional outcomes: Unable to assess due to unplanned discharge   Remaining deficits: Unable to assess due to unplanned discharge   Education / Equipment: Unable to assess due to unplanned discharge Plan: Patient agrees to discharge.  Patient goals were partially met. Patient is being discharged due to not returning since the last visit.  ?????      8:32 AM, 01/03/20 [name], DPT Physical Therapy with [facility]  [phone] office      Encounter Date: 11/03/2019   PT End of Session - 11/03/19 1646    Visit Number 10    Number of Visits 16    Date for PT Re-Evaluation 11/21/19    Authorization Type medicare, no auth , no VL    Progress Note Due on Visit 17    PT Start Time 1618    PT Stop Time 1710    PT Time Calculation (min) 52 min    Equipment Utilized During Treatment Gait belt    Activity Tolerance Patient limited by pain;Patient limited by fatigue;No increased pain    Behavior During Therapy WFL for tasks assessed/performed           Past Medical History:  Diagnosis Date  . Aortic insufficiency   . Atrial fibrillation ([place])   . Diastolic dysfunction   . DOE (dyspnea on exertion)   . GERD (gastroesophageal reflux disease)   . History of pneumonia   . History of pulmonary embolism   . History of stroke   . HTN (hypertension)   . Hyperlipemia   . Mitral valve regurgitation   . Osteoporosis   . Tremor   . Vitamin D deficiency     Past Surgical History:  Procedure Laterality Date  . BLADDER SURGERY     x  3  . CERVICAL SPINE SURGERY    . CHOLECYSTECTOMY    . COLONOSCOPY    . COLONOSCOPY WITH ESOPHAGOGASTRODUODENOSCOPY (EGD)    . LUMBAR SPINE SURGERY    . REPLACEMENT TOTAL KNEE Left   . RIGHT LUNG WEDGE REMOVAL    . TONSILLECTOMY      There were no vitals filed for this visit.   Subjective Assessment - 11/03/19 1622    Subjective pt states she is doing well today, just a little pain between her shoulder blades.  8/10    Currently in Pain? Yes    Pain Score 8     Pain Location Back    Pain Orientation Mid;Upper    Pain Descriptors / Indicators Aching                             [facility] Adult PT Treatment/Exercise - 11/03/19 0001      Knee/Hip Exercises: Standing   Heel Raises 15 reps    Forward Lunges Both;Limitations;15 reps    Forward Lunges Limitations onto 4" box with 1 UE only to work on stability/control    Hip Abduction Both;15 reps    Hip Extension Both;15 reps    Lateral Step Up 15 reps;Hand Hold: 1;Step Height: 4"  Lateral Step Up Limitations slow and controlled    Forward Step Up 15 reps;Hand Hold: 1;Step Height: 4"    Forward Step Up Limitations with opposite LE power up    Stairs 4" step reciprcoally with 1 HR 3RT    Gait Training no AD today    Other Standing Knee Exercises retro, sidestepping 2RT, tandem 1RT                    PT Short Term Goals - 10/24/19 1648      PT SHORT TERM GOAL #1   Title Patient will be independent in self management strategies to improve quality of life and functional outcomes.    Time 3    Period Weeks    Status Partially Met    Target Date 10/10/19      PT SHORT TERM GOAL #2   Title Patient will report at least 25% improvement in overall symptoms and/or function to demonstrate improved functional mobility    Time 3    Period Weeks    Status On-going    Target Date 10/10/19             PT Long Term Goals - 10/24/19 1648      PT LONG TERM GOAL #1   Title Patient will improve on FOTO  score to meet predicted outcomes to demonstrate improved functional mobility.    Time 6    Period Weeks    Status Partially Met      PT LONG TERM GOAL #2   Title Patient will be able to ambulate at least 200 feet with RW in 2 minutes to demonstrate improved walking ability.    Time 6    Period Weeks    Status On-going      PT LONG TERM GOAL #3   Title Patient will be able to perform 5x STS in <35 seconds to demonstrate improved functional strength    Time 6    Period Weeks    Status Achieved      PT LONG TERM GOAL #4   Title Patient will be able to ascend and descend stairs with step to gait pattern and use of railing independtly to improve ability to go up stairs at home.    Time 6    Period Weeks    Status Partially Met                 Plan - 11/03/19 1647    Clinical Impression Statement continued with focus on improivng LE strneght and balance.  Pt able to complete full session with only 2 short seated rest breaks.  Cues for taking larger steps and maintaining forward gaze with dynamic balance activities.  RPogressed with lateral step ups and forward step ups to improve balance and strength.  Continued diffiuclty completng higher 7" steps reciprocally, especially with descending.  PT finished on nustep at EOS.    Personal Factors and Comorbidities Comorbidity 1;Comorbidity 2;Comorbidity 3+    Comorbidities cva, tremor, back pain, neck and back surgery    Examination-Activity Limitations Bathing;Bed Mobility;Caring for Others;Dressing;Transfers;Stand;Stairs;Squat;Sit    Examination-Participation Restrictions Cleaning;Community Activity    Stability/Clinical Decision Making Stable/Uncomplicated    Rehab Potential Good    PT Frequency 2x / week    PT Duration 4 weeks    PT Treatment/Interventions ADLs/Self Care Home Management;Aquatic Therapy;Cryotherapy;Electrical Stimulation;Moist Heat;Traction;Balance training;Therapeutic exercise;Therapeutic activities;Functional  mobility training;Stair training;Gait training;DME Instruction;Neuromuscular re-education;Patient/family education;Manual techniques;Energy conservation;Passive range of motion;Joint Manipulations    PT Next  Visit Plan continue therapy with focus on LE strengthening and stability moving forward.    PT Home Exercise Plan 09/22/19: deep breathing, core activation, seated posture awareness and scapular retraction.    Consulted and Agree with Plan of Care Patient           Patient will benefit from skilled therapeutic intervention in order to improve the following deficits and impairments:  Abnormal gait, Decreased endurance, Decreased strength, Decreased activity tolerance, Decreased knowledge of precautions, Decreased knowledge of use of DME, Decreased balance, Difficulty walking, Decreased range of motion, Pain  Visit Diagnosis: Difficulty in walking, not elsewhere classified  Muscle weakness (generalized)  Chronic midline low back pain without sciatica     Problem List Patient Active Problem List   Diagnosis Date Noted  . Essential tremor 08/15/2019  . Cerebrovascular accident (CVA) ([place]) 08/15/2019  . Gait abnormality 08/15/2019   Teena Irani, PTA/CLT [phone]  Teena Irani 11/03/2019, 5:31 PM  [facility] [place], [place], [address] Phone: [phone]   Fax:  [phone]  Name: Karmina Zufall MRN: [id number] Date of Birth: [birth year]

## 2019-11-08 ENCOUNTER — Ambulatory Visit (HOSPITAL_COMMUNITY): Payer: Medicare Other | Admitting: Physical Therapy

## 2019-11-10 ENCOUNTER — Ambulatory Visit (HOSPITAL_COMMUNITY): Payer: Medicare Other | Admitting: Physical Therapy

## 2019-11-23 DIAGNOSIS — Z0189 Encounter for other specified special examinations: Secondary | ICD-10-CM | POA: Diagnosis not present

## 2019-11-23 DIAGNOSIS — M5481 Occipital neuralgia: Secondary | ICD-10-CM | POA: Diagnosis not present

## 2019-12-12 DIAGNOSIS — M5481 Occipital neuralgia: Secondary | ICD-10-CM | POA: Diagnosis not present

## 2019-12-12 DIAGNOSIS — E782 Mixed hyperlipidemia: Secondary | ICD-10-CM | POA: Diagnosis not present

## 2019-12-12 DIAGNOSIS — E559 Vitamin D deficiency, unspecified: Secondary | ICD-10-CM | POA: Diagnosis not present

## 2019-12-12 DIAGNOSIS — Z0189 Encounter for other specified special examinations: Secondary | ICD-10-CM | POA: Diagnosis not present

## 2019-12-21 DIAGNOSIS — M5481 Occipital neuralgia: Secondary | ICD-10-CM | POA: Diagnosis not present

## 2019-12-21 DIAGNOSIS — Z0189 Encounter for other specified special examinations: Secondary | ICD-10-CM | POA: Diagnosis not present

## 2019-12-21 DIAGNOSIS — Z0001 Encounter for general adult medical examination with abnormal findings: Secondary | ICD-10-CM | POA: Diagnosis not present

## 2019-12-21 DIAGNOSIS — N390 Urinary tract infection, site not specified: Secondary | ICD-10-CM | POA: Diagnosis not present

## 2019-12-21 DIAGNOSIS — I480 Paroxysmal atrial fibrillation: Secondary | ICD-10-CM | POA: Diagnosis not present

## 2019-12-21 DIAGNOSIS — E559 Vitamin D deficiency, unspecified: Secondary | ICD-10-CM | POA: Diagnosis not present

## 2019-12-21 DIAGNOSIS — M130 Polyarthritis, unspecified: Secondary | ICD-10-CM | POA: Diagnosis not present

## 2019-12-21 DIAGNOSIS — G9009 Other idiopathic peripheral autonomic neuropathy: Secondary | ICD-10-CM | POA: Diagnosis not present

## 2020-01-03 DIAGNOSIS — L110 Acquired keratosis follicularis: Secondary | ICD-10-CM | POA: Diagnosis not present

## 2020-01-03 DIAGNOSIS — M79674 Pain in right toe(s): Secondary | ICD-10-CM | POA: Diagnosis not present

## 2020-01-03 DIAGNOSIS — M79671 Pain in right foot: Secondary | ICD-10-CM | POA: Diagnosis not present

## 2020-01-03 DIAGNOSIS — I739 Peripheral vascular disease, unspecified: Secondary | ICD-10-CM | POA: Diagnosis not present

## 2020-01-03 DIAGNOSIS — M79672 Pain in left foot: Secondary | ICD-10-CM | POA: Diagnosis not present

## 2020-01-13 DIAGNOSIS — J449 Chronic obstructive pulmonary disease, unspecified: Secondary | ICD-10-CM | POA: Diagnosis not present

## 2020-01-13 DIAGNOSIS — R0602 Shortness of breath: Secondary | ICD-10-CM | POA: Diagnosis not present

## 2020-04-06 DIAGNOSIS — M545 Low back pain, unspecified: Secondary | ICD-10-CM | POA: Diagnosis not present

## 2020-04-06 DIAGNOSIS — Z0189 Encounter for other specified special examinations: Secondary | ICD-10-CM | POA: Diagnosis not present

## 2020-04-06 DIAGNOSIS — R300 Dysuria: Secondary | ICD-10-CM | POA: Diagnosis not present

## 2020-04-06 DIAGNOSIS — Z0001 Encounter for general adult medical examination with abnormal findings: Secondary | ICD-10-CM | POA: Diagnosis not present

## 2020-04-06 DIAGNOSIS — M5481 Occipital neuralgia: Secondary | ICD-10-CM | POA: Diagnosis not present

## 2020-04-06 DIAGNOSIS — N390 Urinary tract infection, site not specified: Secondary | ICD-10-CM | POA: Diagnosis not present

## 2020-04-19 ENCOUNTER — Ambulatory Visit (HOSPITAL_COMMUNITY)
Admission: RE | Admit: 2020-04-19 | Discharge: 2020-04-19 | Disposition: A | Discharge status: Home / Self Care | Payer: Medicare Other | Source: Ambulatory Visit | Attending: Nurse Practitioner | Admitting: Nurse Practitioner

## 2020-04-19 ENCOUNTER — Other Ambulatory Visit (HOSPITAL_COMMUNITY): Payer: Self-pay | Admitting: Internal Medicine

## 2020-04-19 ENCOUNTER — Other Ambulatory Visit (HOSPITAL_COMMUNITY): Payer: Self-pay | Admitting: Nurse Practitioner

## 2020-04-19 ENCOUNTER — Other Ambulatory Visit: Payer: Self-pay

## 2020-04-19 DIAGNOSIS — M47815 Spondylosis without myelopathy or radiculopathy, thoracolumbar region: Secondary | ICD-10-CM | POA: Diagnosis not present

## 2020-04-19 DIAGNOSIS — M545 Low back pain, unspecified: Secondary | ICD-10-CM | POA: Insufficient documentation

## 2020-04-19 DIAGNOSIS — N390 Urinary tract infection, site not specified: Secondary | ICD-10-CM | POA: Diagnosis not present

## 2020-04-19 DIAGNOSIS — R300 Dysuria: Secondary | ICD-10-CM | POA: Diagnosis not present

## 2020-04-19 IMAGING — DX DG THORACIC SPINE 3V
3 series · 3 of 3 positions shown · non-contrast
Comparison: None.

CLINICAL DATA: Pain.

EXAM:
THORACIC SPINE - 3 VIEWS; LUMBAR SPINE - 2-3 VIEW

[t-spine ap]
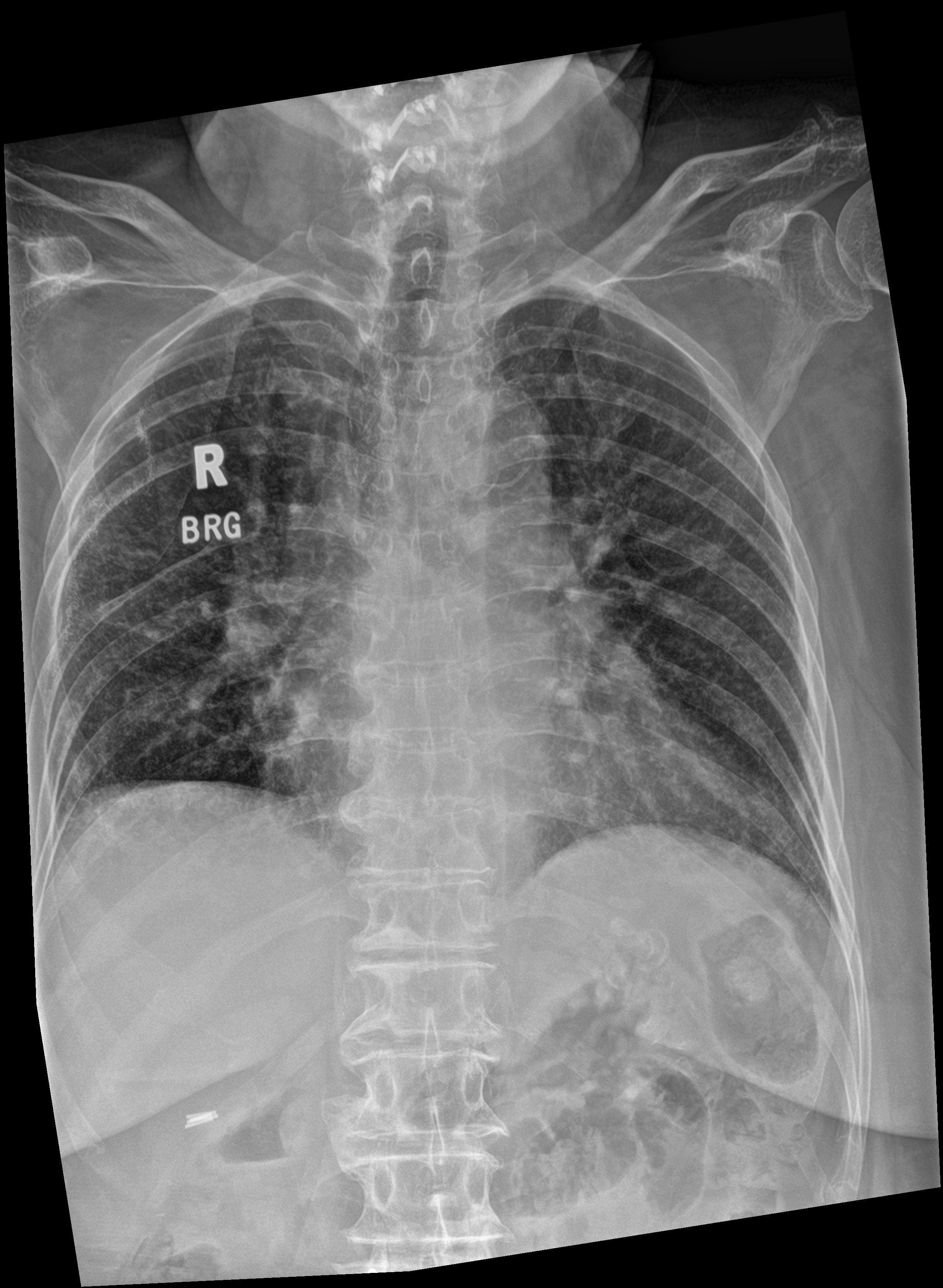

[t-spine lat]
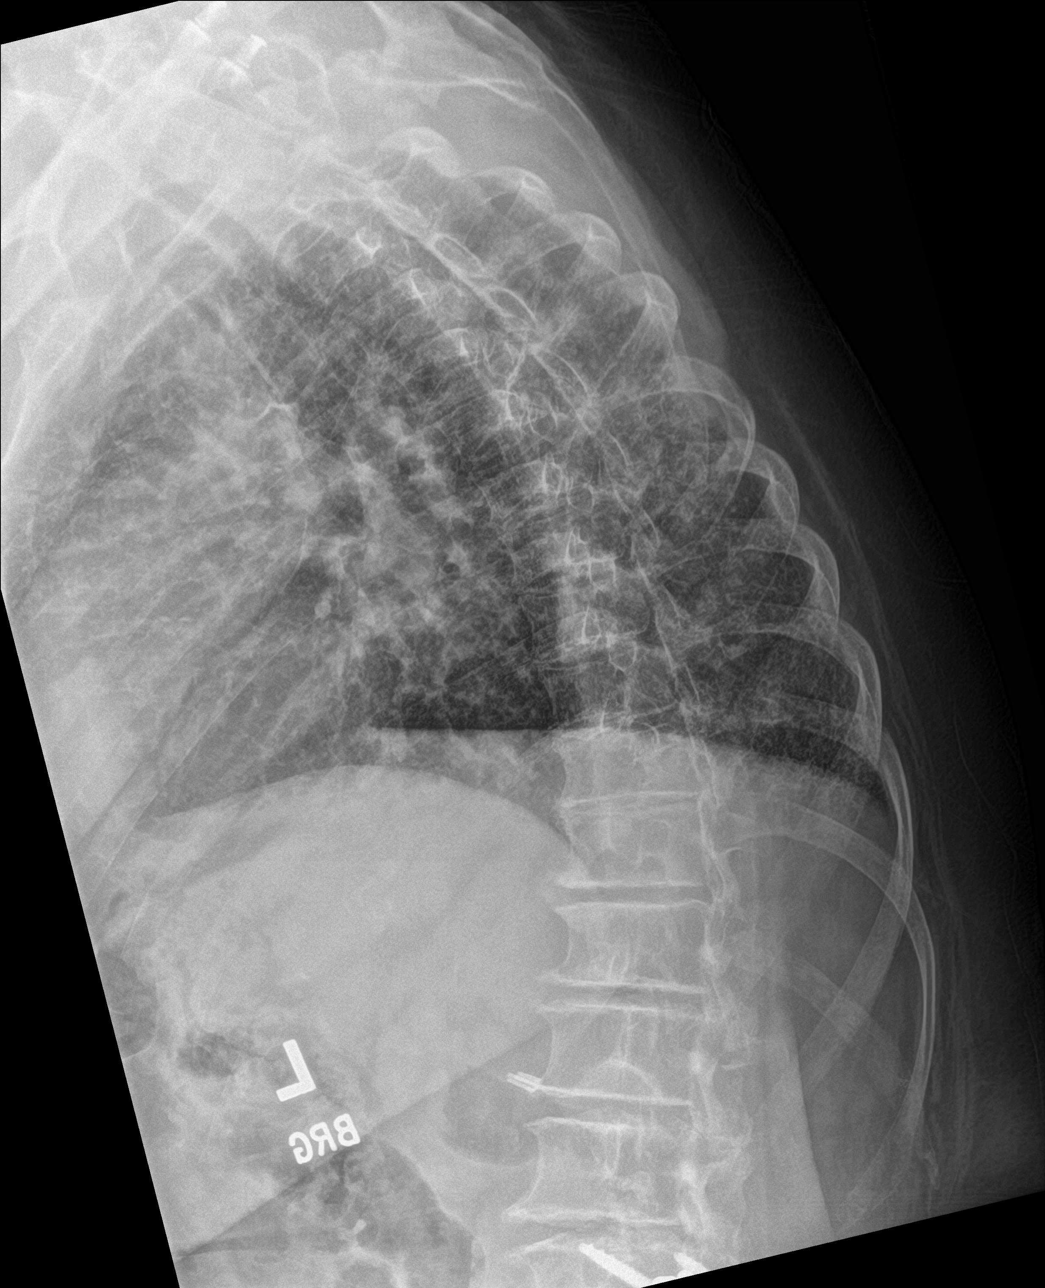

[t-spine swimmers]
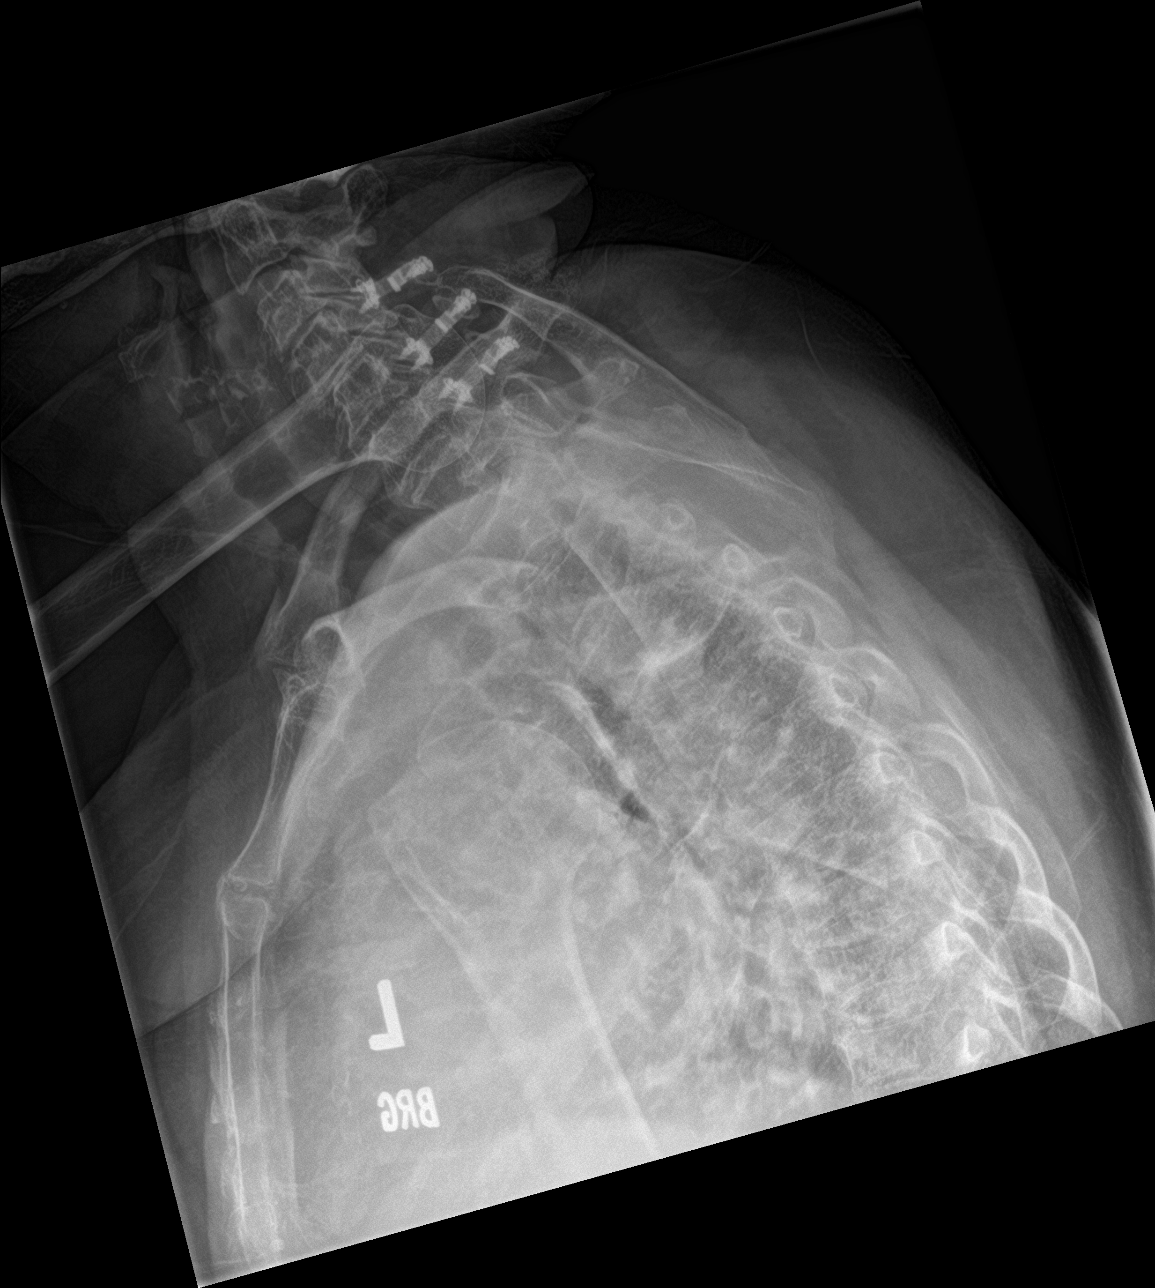

[3 of 3 positions shown; findings below may reference images not displayed]

FINDINGS: there are prominent interstitial lung markings involving the lungs
bilaterally. There are few nodular densities for example in the left
mid lung zone. The patient appears be status post prior wedge
resection on the right. Aortic calcifications are noted.

There are mild to moderate degenerative changes noted throughout the
thoracolumbar spine. There is no acute compression fracture. No
significant malalignment. There are partially visualized at least
moderate degenerative changes of the cervical spine. The patient is
status post prior fusion of the lumbar spine from L3 through L5 with
multilevel interbody spacers. The hardware appears grossly intact.
IMPRESSION: 1. No acute osseous abnormality.
2. Mild-to-moderate degenerative changes are noted throughout the
visualized thoracolumbar spine.
3. Questionable nodular densities in both lung fields, left worse
than right. A dedicated follow-up two-view chest x-ray is
recommended.

## 2020-04-19 IMAGING — DX DG LUMBAR SPINE 2-3V
3 series · 3 of 3 positions shown · non-contrast
Comparison: None.

CLINICAL DATA: Pain.

EXAM:
THORACIC SPINE - 3 VIEWS; LUMBAR SPINE - 2-3 VIEW

[l-spine ap]
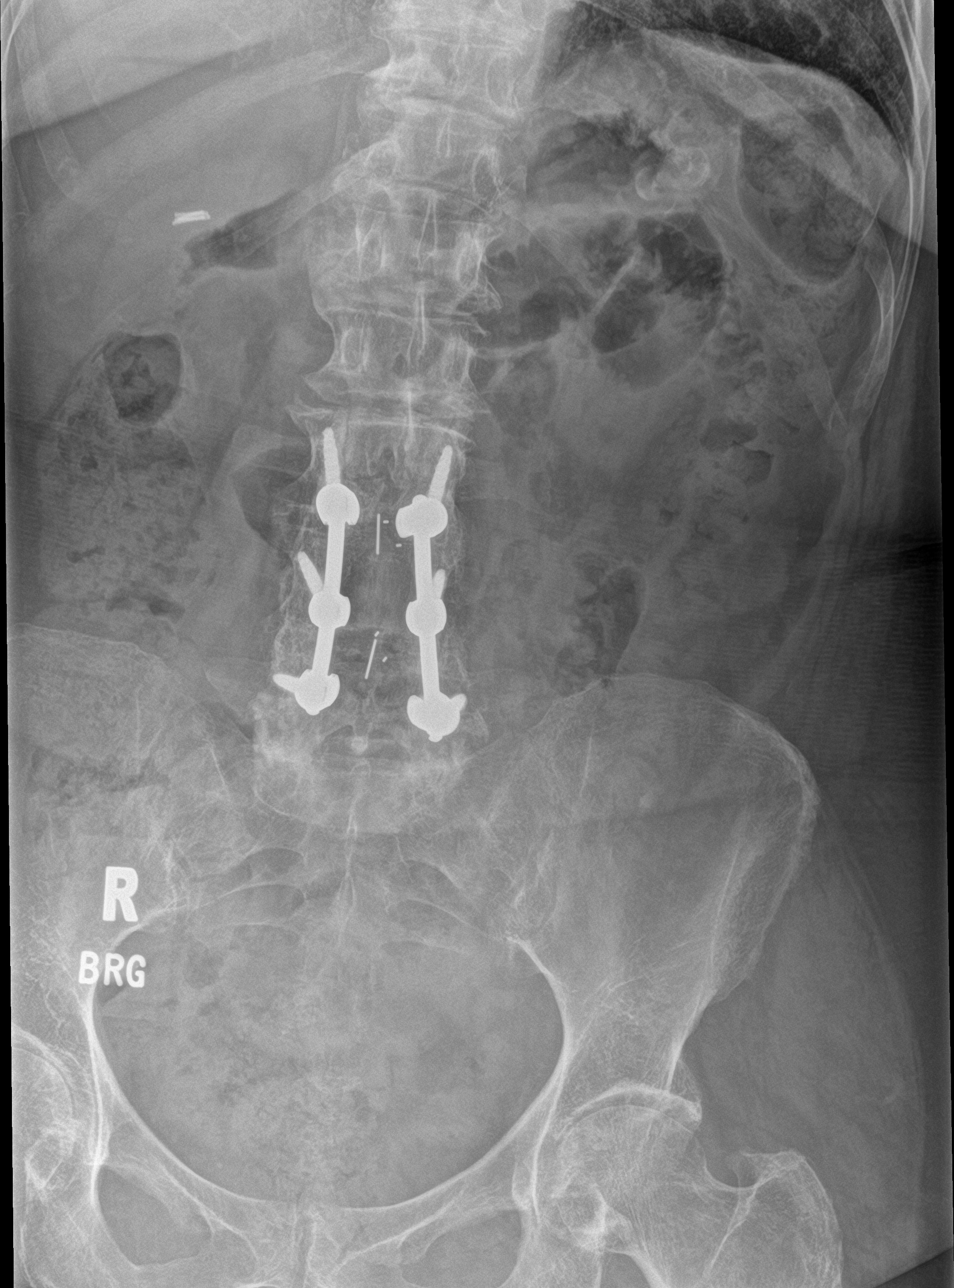

[l-spine lat]
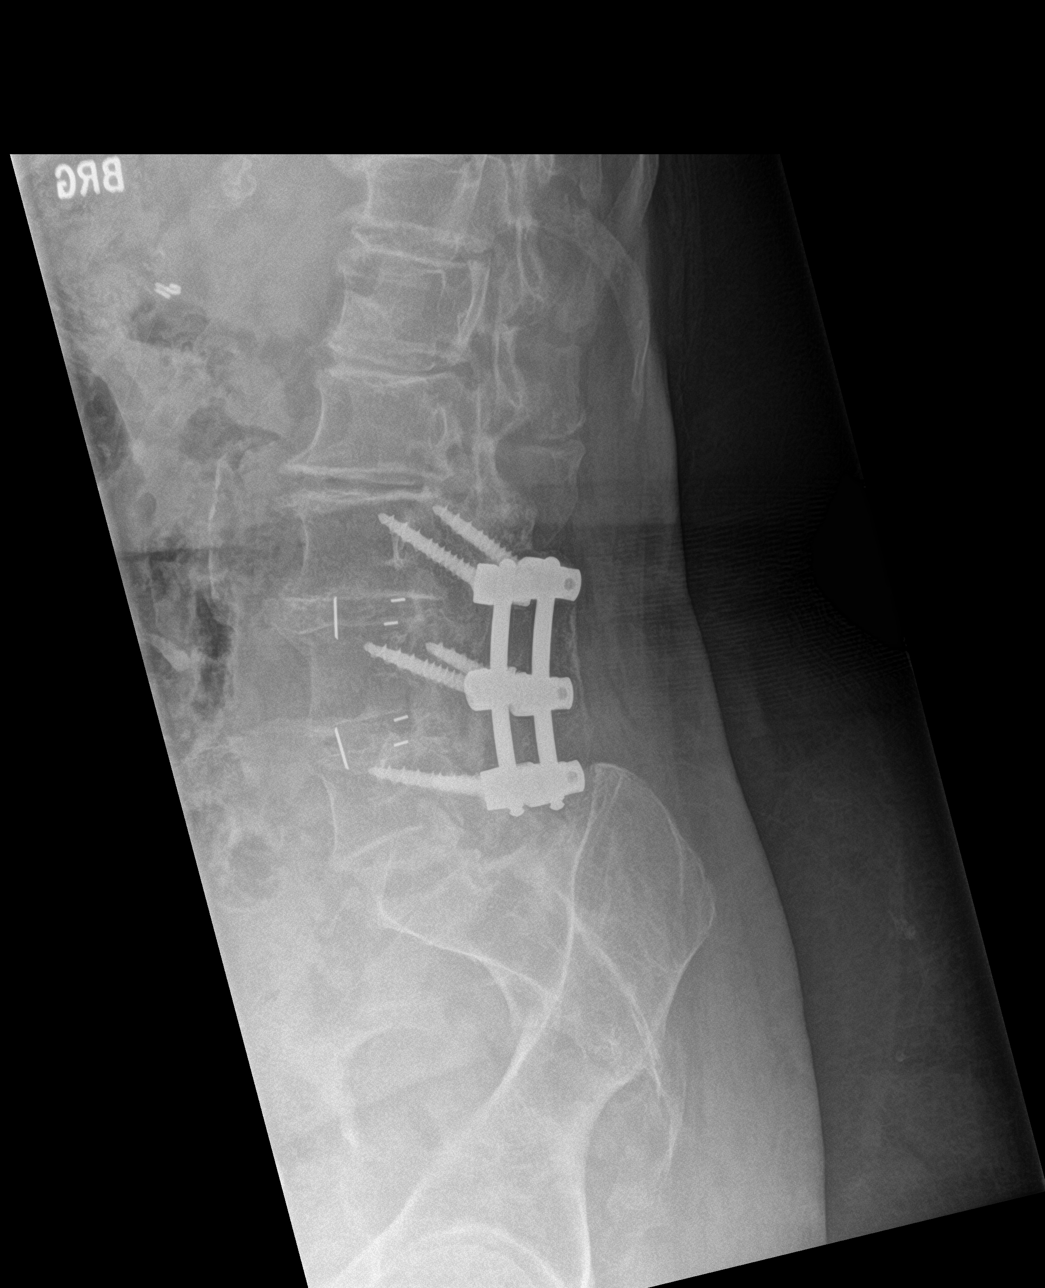

[l-spine spot]
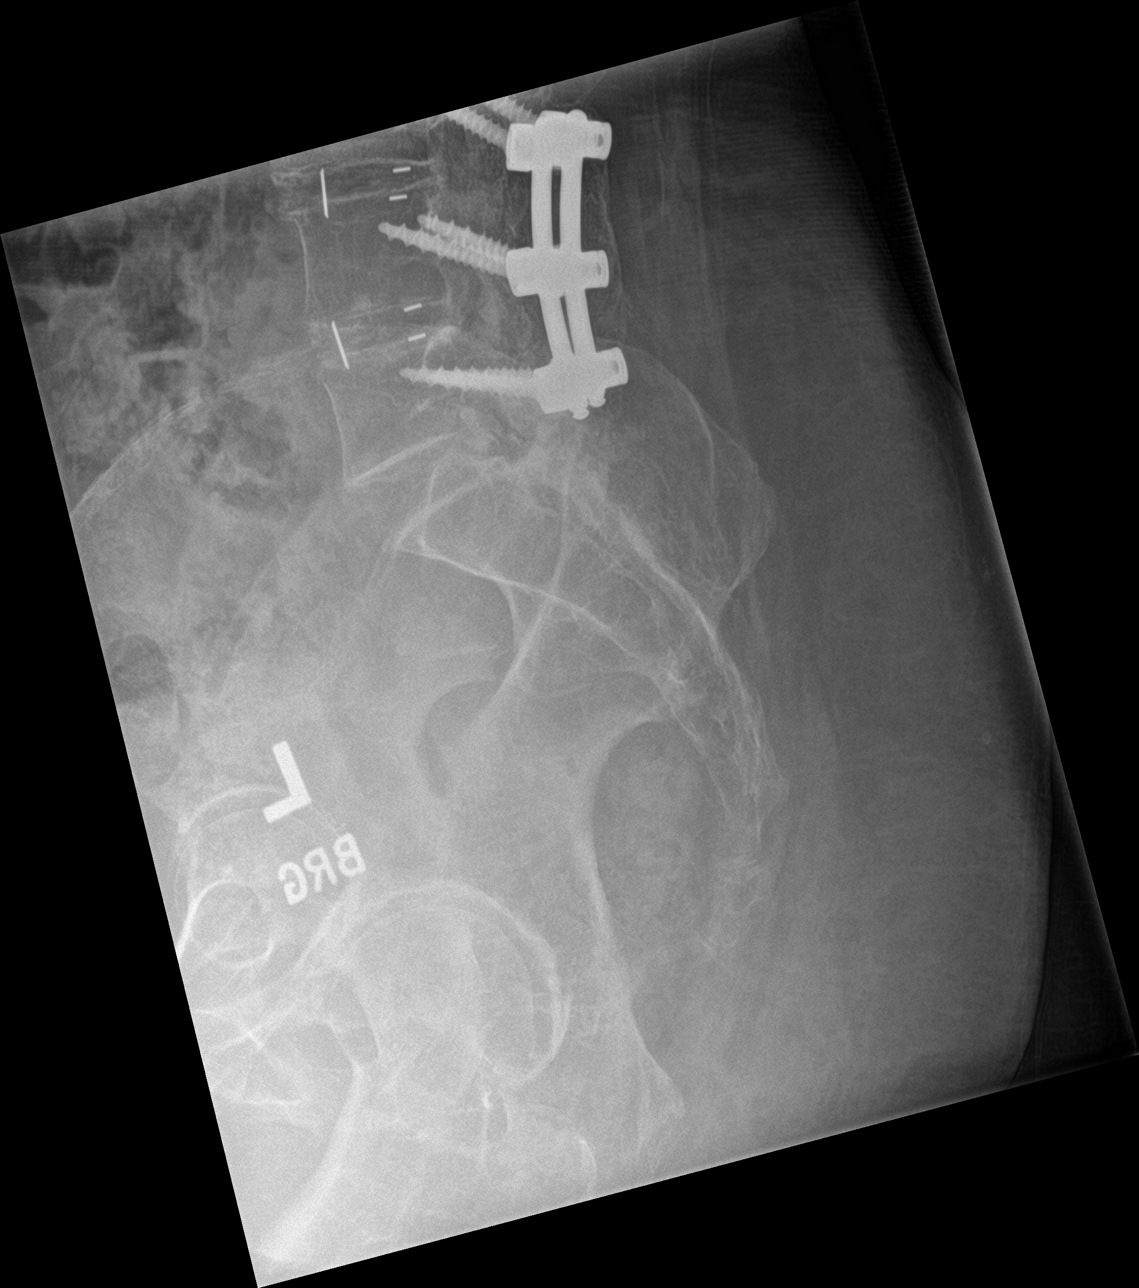

[3 of 3 positions shown; findings below may reference images not displayed]

FINDINGS: there are prominent interstitial lung markings involving the lungs
bilaterally. There are few nodular densities for example in the left
mid lung zone. The patient appears be status post prior wedge
resection on the right. Aortic calcifications are noted.

There are mild to moderate degenerative changes noted throughout the
thoracolumbar spine. There is no acute compression fracture. No
significant malalignment. There are partially visualized at least
moderate degenerative changes of the cervical spine. The patient is
status post prior fusion of the lumbar spine from L3 through L5 with
multilevel interbody spacers. The hardware appears grossly intact.
IMPRESSION: 1. No acute osseous abnormality.
2. Mild-to-moderate degenerative changes are noted throughout the
visualized thoracolumbar spine.
3. Questionable nodular densities in both lung fields, left worse
than right. A dedicated follow-up two-view chest x-ray is
recommended.

## 2020-04-24 ENCOUNTER — Other Ambulatory Visit (HOSPITAL_COMMUNITY): Payer: Self-pay | Admitting: Internal Medicine

## 2020-04-24 DIAGNOSIS — M545 Low back pain, unspecified: Secondary | ICD-10-CM

## 2020-04-27 DIAGNOSIS — M545 Low back pain, unspecified: Secondary | ICD-10-CM | POA: Diagnosis not present

## 2020-04-27 DIAGNOSIS — M546 Pain in thoracic spine: Secondary | ICD-10-CM | POA: Diagnosis not present

## 2020-04-30 ENCOUNTER — Ambulatory Visit (HOSPITAL_COMMUNITY): Payer: Medicare Other

## 2020-04-30 ENCOUNTER — Other Ambulatory Visit (HOSPITAL_COMMUNITY): Payer: Self-pay | Admitting: Orthopedic Surgery

## 2020-04-30 ENCOUNTER — Other Ambulatory Visit: Payer: Self-pay | Admitting: Orthopedic Surgery

## 2020-04-30 DIAGNOSIS — M545 Low back pain, unspecified: Secondary | ICD-10-CM

## 2020-04-30 DIAGNOSIS — M546 Pain in thoracic spine: Secondary | ICD-10-CM

## 2020-05-03 ENCOUNTER — Encounter (HOSPITAL_COMMUNITY): Payer: Self-pay

## 2020-05-03 ENCOUNTER — Ambulatory Visit (HOSPITAL_COMMUNITY): Payer: Medicare Other

## 2020-05-11 ENCOUNTER — Ambulatory Visit (HOSPITAL_COMMUNITY)
Admission: RE | Admit: 2020-05-11 | Discharge: 2020-05-11 | Disposition: A | Discharge status: Home / Self Care | Payer: Medicare Other | Source: Ambulatory Visit | Attending: Orthopedic Surgery | Admitting: Orthopedic Surgery

## 2020-05-11 ENCOUNTER — Other Ambulatory Visit: Payer: Self-pay

## 2020-05-11 DIAGNOSIS — M5124 Other intervertebral disc displacement, thoracic region: Secondary | ICD-10-CM | POA: Diagnosis not present

## 2020-05-11 DIAGNOSIS — M546 Pain in thoracic spine: Secondary | ICD-10-CM

## 2020-05-11 DIAGNOSIS — M545 Low back pain, unspecified: Secondary | ICD-10-CM

## 2020-05-11 DIAGNOSIS — M2578 Osteophyte, vertebrae: Secondary | ICD-10-CM | POA: Diagnosis not present

## 2020-05-11 DIAGNOSIS — M4804 Spinal stenosis, thoracic region: Secondary | ICD-10-CM | POA: Diagnosis not present

## 2020-05-11 DIAGNOSIS — M47814 Spondylosis without myelopathy or radiculopathy, thoracic region: Secondary | ICD-10-CM | POA: Diagnosis not present

## 2020-05-11 IMAGING — MR MR THORACIC SPINE W/O CM
5 of 6 series · 26 of 48 positions shown · non-contrast
Comparison: Lumbar study same day

CLINICAL DATA: Mid and lower back pain radiating down both legs.

EXAM:
MRI THORACIC SPINE WITHOUT CONTRAST
TECHNIQUE: Multiplanar, multisequence MR imaging of the thoracic spine was
performed. No intravenous contrast was administered.

[Series 18: T1 · sagittal · 3.3mm · 0.62mm/px · 4 of 13 slices shown (1 of 2)]
[im 1/13]
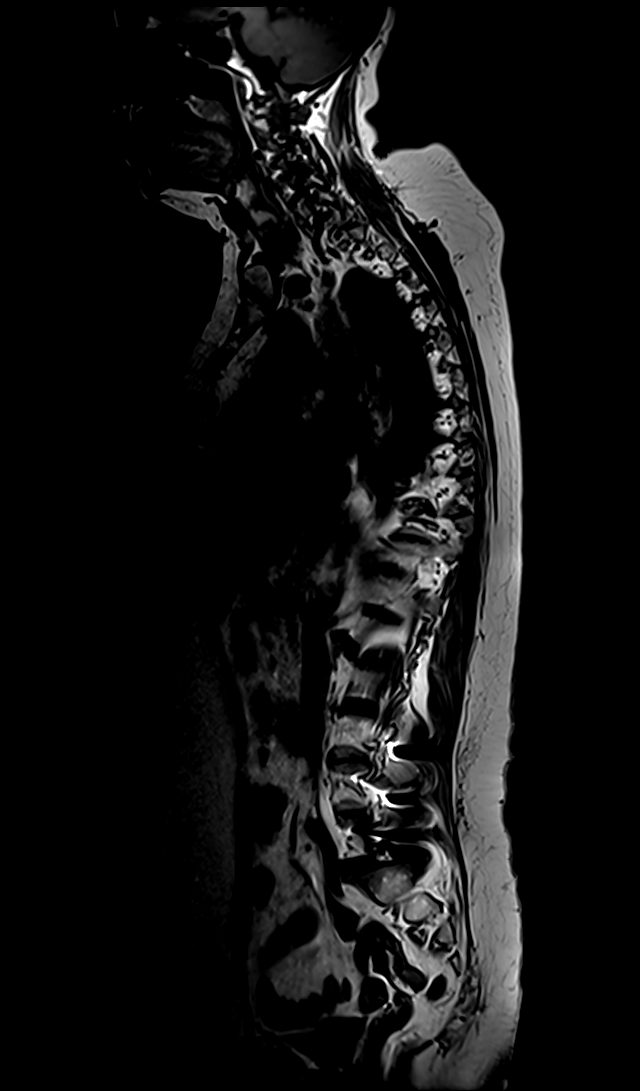
[im 5/13]
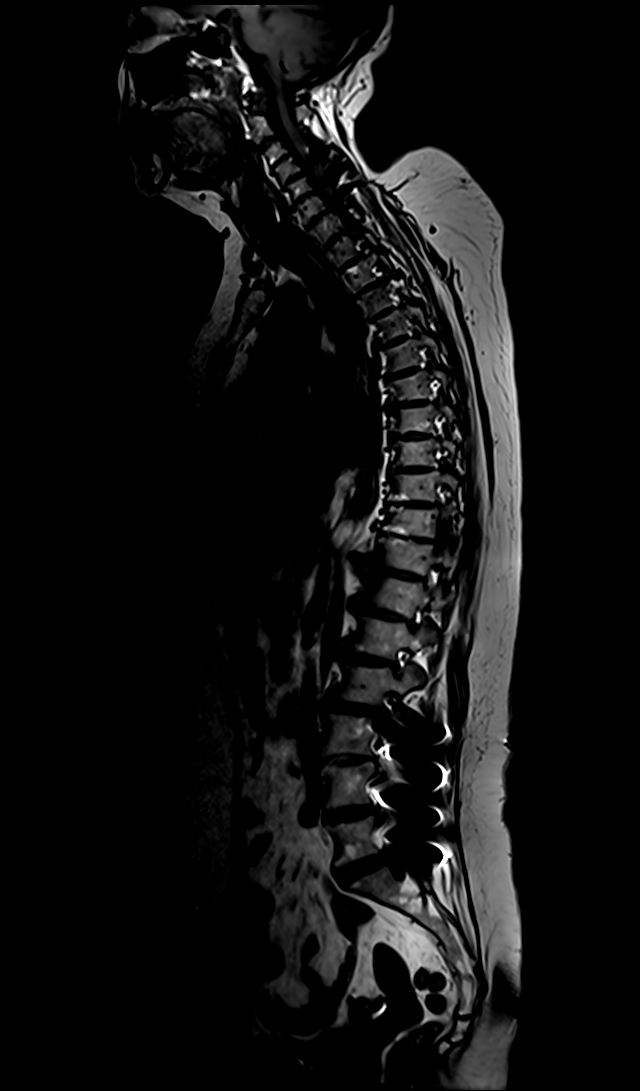
[im 9/13]
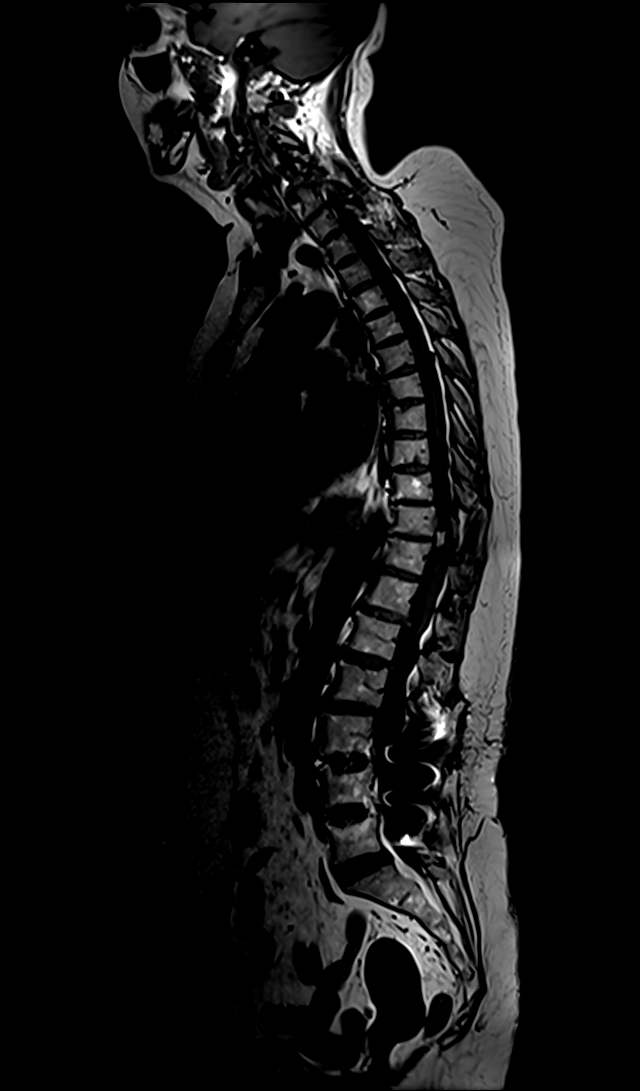
[im 13/13]
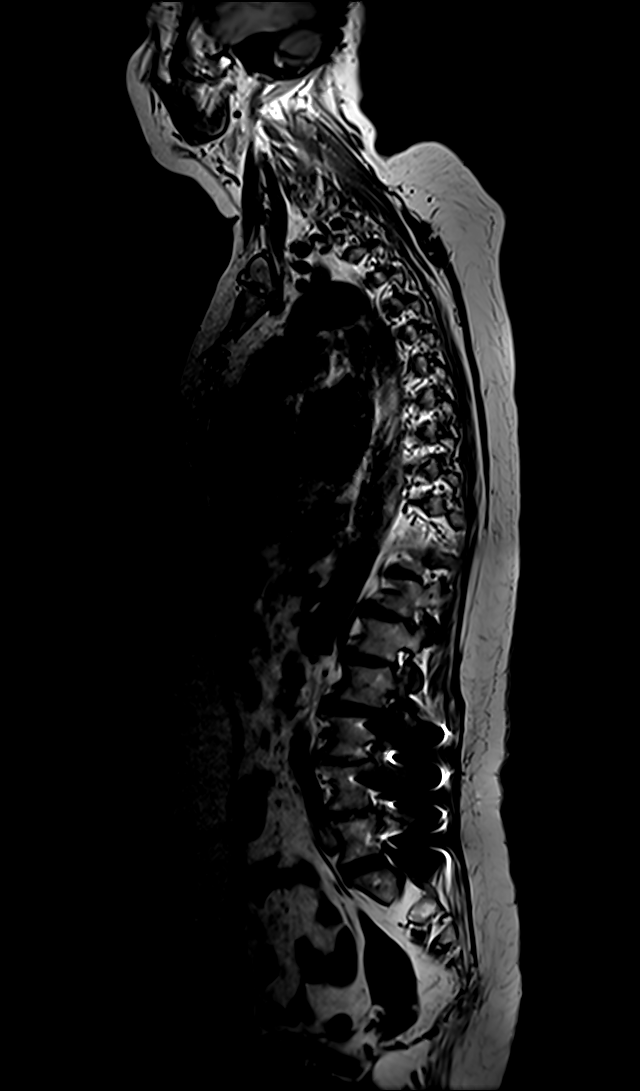

[Series 19: T2 · sagittal · 3.0mm · 0.94mm/px · 6 of 17 slices shown (1 of 2)]
[im 1/17]
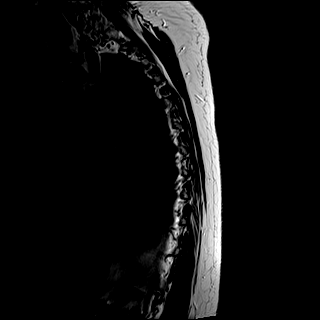
[im 4/17]
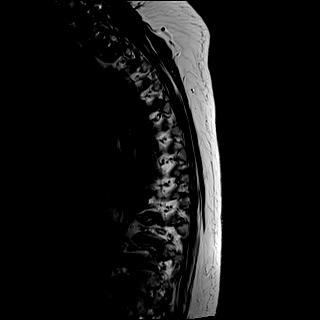
[im 7/17]
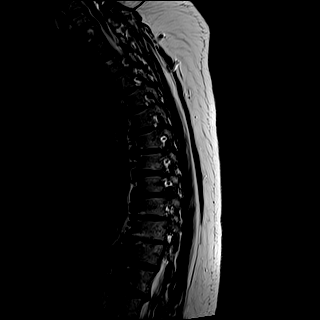
[im 10/17]
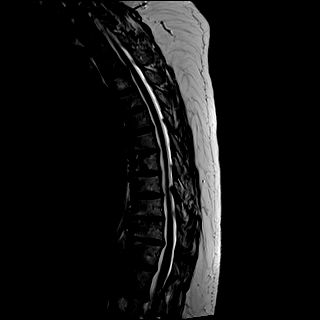
[im 13/17]
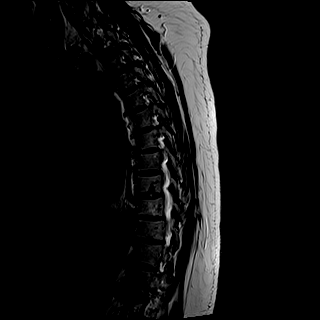
[im 17/17]
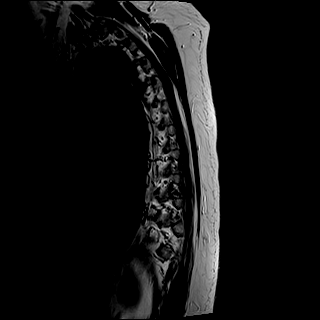

[Series 20: T1 · sagittal · 3.0mm · 1.33mm/px · 6 of 17 slices shown (2 of 2)]
[im 1/17]
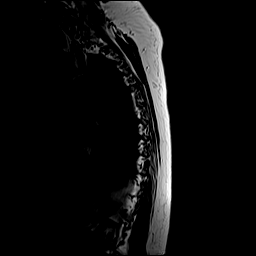
[im 4/17]
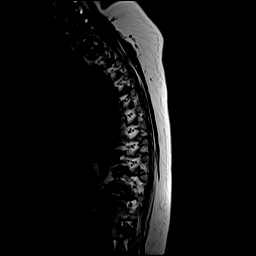
[im 7/17]
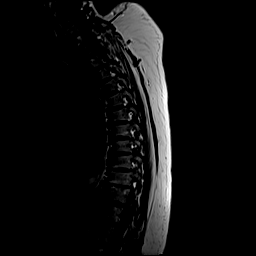
[im 10/17]
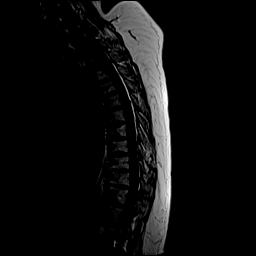
[im 13/17]
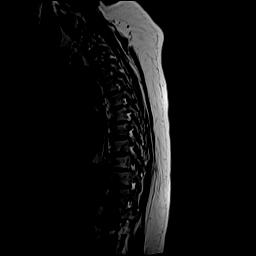
[im 17/17]
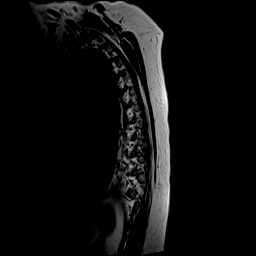

[Series 21: STIR · sagittal · 3.0mm · 0.53mm/px · 1 of 17 slices shown]
[im 1/17]
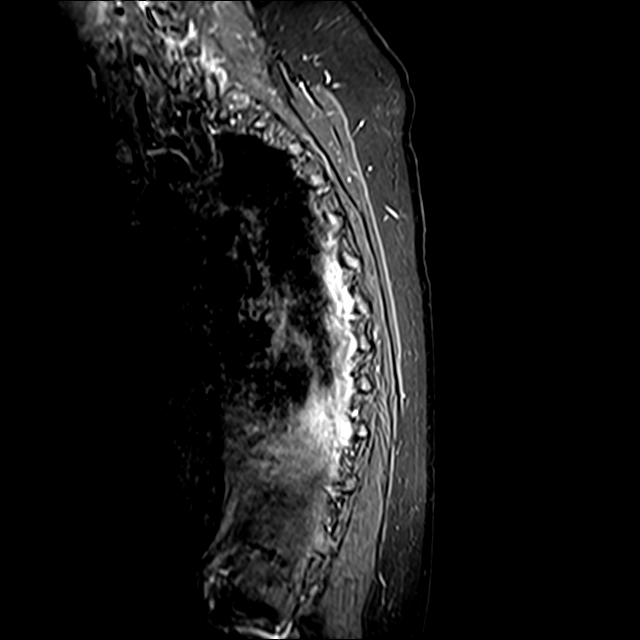

[Series 22: T2 · axial · 5.0mm · 0.74mm/px · z∈[-253,-68]mm · 9 of 39 slices shown (2 of 2)]
[im 1/39]
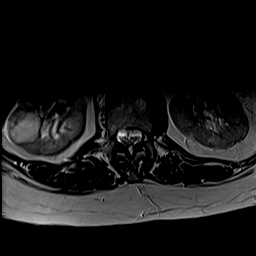
[im 7/39]
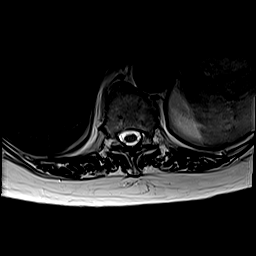
[im 13/39]
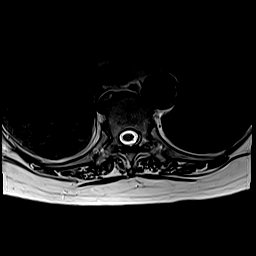
[im 16/39]
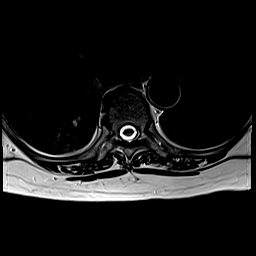
[im 20/39]
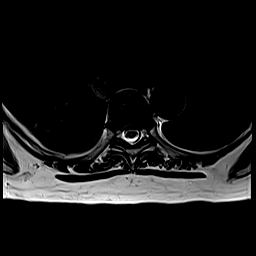
[im 23/39]
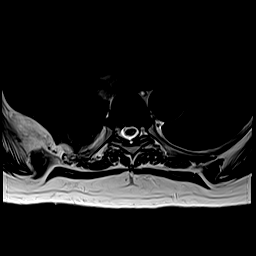
[im 26/39]
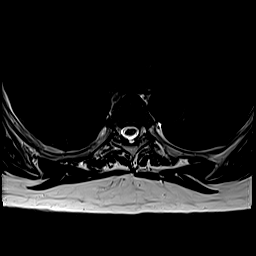
[im 32/39]
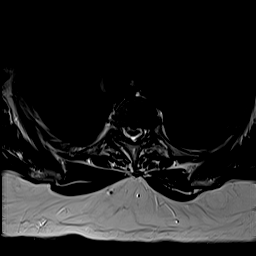
[im 39/39]
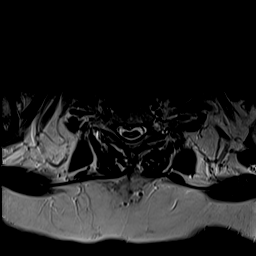

[26 of 48 positions shown; findings below may reference images not displayed]

FINDINGS: Alignment: No significant thoracic malalignment. Slight increased
kyphotic curvature in the lower thoracic region.

Vertebrae: No fracture or primary bone lesion. Chronic appearing
endplate Schmorl's nodes.

Cord:  No cord compression or primary cord lesion.

Paraspinal and other soft tissues: Negative

Disc levels:

T1-2: No disc pathology.  Mild facet osteoarthritis.  No stenosis.

T2-3: Small central disc herniation. Bilateral facet osteoarthritis.
Mild narrowing of the canal with slight indentation of the ventral
cord. Ample subarachnoid space appears present dorsal to the cord
however and therefore no likely cord compression. Mild bilateral
foraminal stenosis.

T3-4: No disc abnormality. Bilateral facet osteoarthritis. No
significant stenosis.

T4-5: No disc abnormality. Bilateral facet osteoarthritis. No
significant stenosis.

T5-6: Mild disc bulge.  No stenosis.

T6-7: Mild disc bulge.  No stenosis.

T7-8: Mild disc bulge.  No stenosis.

T8-9: Mild disc bulge. Bilateral facet degeneration. No compressive
stenosis.

T9-10: Mild disc bulge.  Bilateral facet degeneration.  No stenosis.

T10-11: Endplate osteophytes and mild bulging of the disc. Mild
facet osteoarthritis. No compressive stenosis.

T11-12: Mild disc bulge.  No stenosis.

T12-L1: Shallow disc protrusion indents the thecal sac but does not
appear to compress the distal cord or conus.
IMPRESSION: 1. At T2-3, there is a small central disc herniation. Bilateral
facet osteoarthritis. Mild canal narrowing with slight indentation
of the ventral cord. Ample subarachnoid space appears present dorsal
to the cord however. Mild bilateral foraminal narrowing
2. At T12-L1, there is a shallow disc protrusion which indents the
thecal sac but does not appear to compress the distal cord or conus.
3. Lesser degenerative changes at other levels as outlined above.

## 2020-05-11 IMAGING — MR MR LUMBAR SPINE W/O CM
5 series · 32 of 48 positions shown · non-contrast
Comparison: Radiography [DATE]

CLINICAL DATA: Mid and low back pain extending to the legs.

EXAM:
MRI LUMBAR SPINE WITHOUT CONTRAST
TECHNIQUE: Multiplanar, multisequence MR imaging of the lumbar spine was
performed. No intravenous contrast was administered.

[Series 1: T2 · sagittal · 4.0mm · 0.81mm/px · 6 of 15 slices shown (1 of 2)]
[im 1/15]
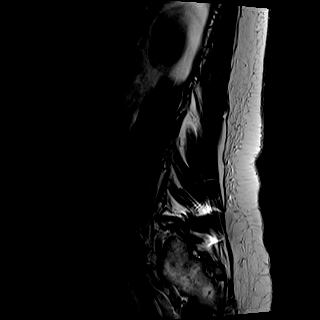
[im 3/15]
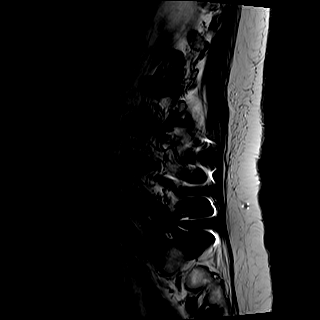
[im 6/15]
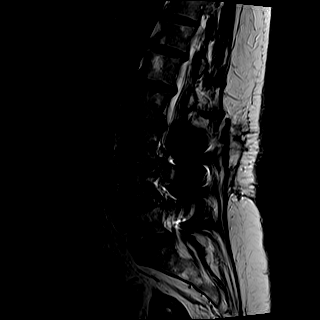
[im 9/15]
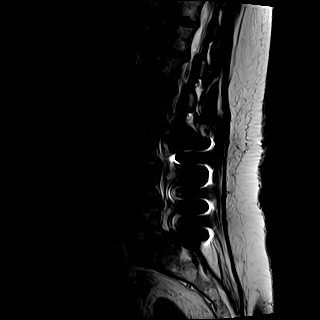
[im 12/15]
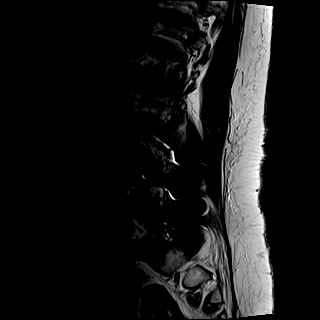
[im 15/15]
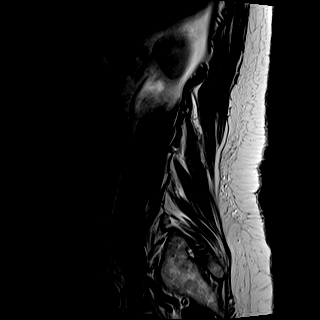

[Series 2: STIR · sagittal · 4.0mm · 0.51mm/px · 3 of 15 slices shown]
[im 1/15]
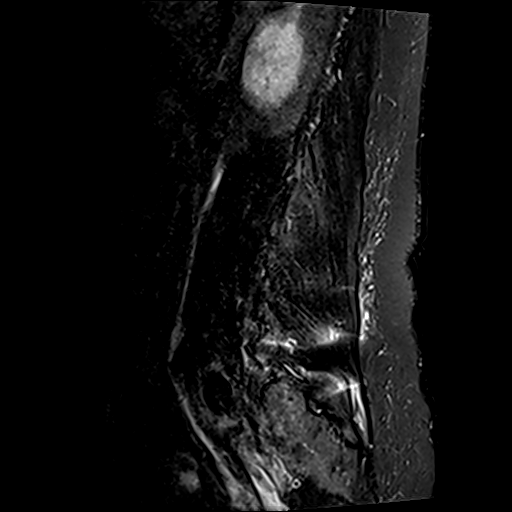
[im 3/15]
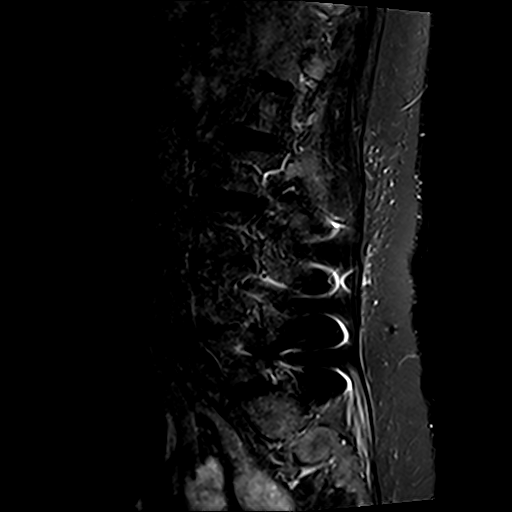
[im 5/15]
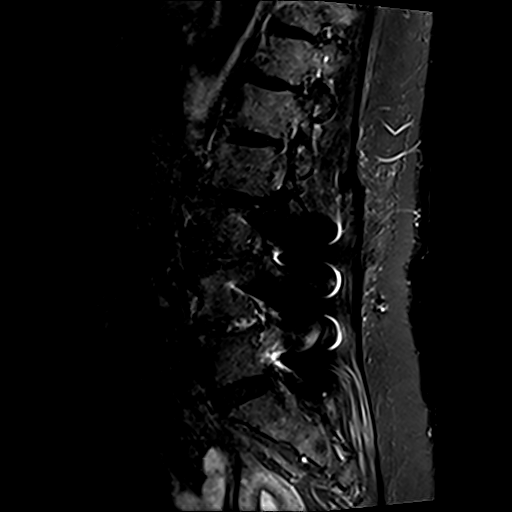

[Series 3: T1 · sagittal · 4.0mm · 1.02mm/px · 7 of 15 slices shown (1 of 2)]
[im 1/15]
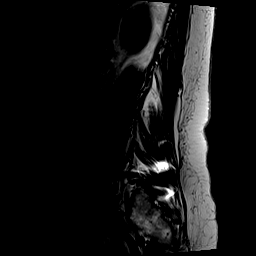
[im 3/15]
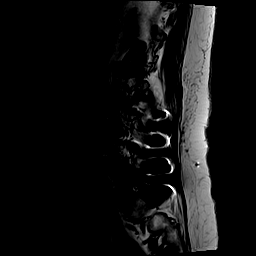
[im 5/15]
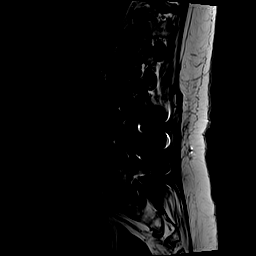
[im 8/15]
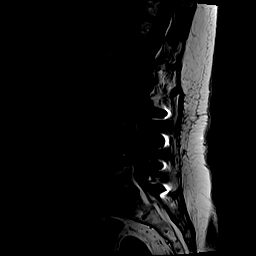
[im 10/15]
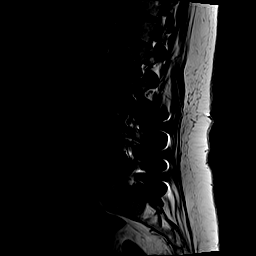
[im 12/15]
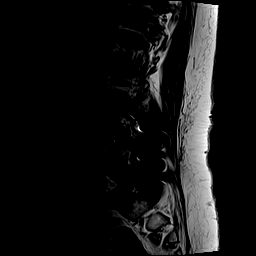
[im 15/15]
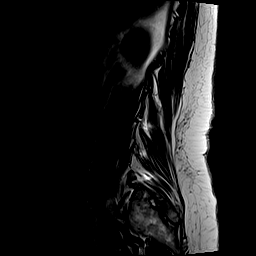

[Series 4: T2 · axial · 4.0mm · 0.70mm/px · z∈[-467,-273]mm · 8 of 30 slices shown (2 of 2)]
[im 1/30]
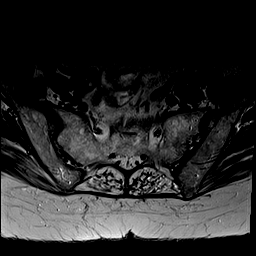
[im 5/30]
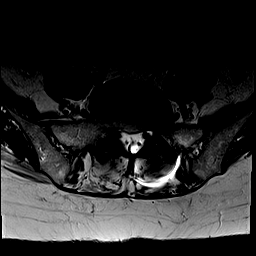
[im 9/30]
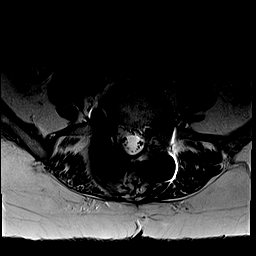
[im 14/30]
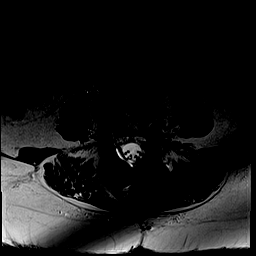
[im 16/30]
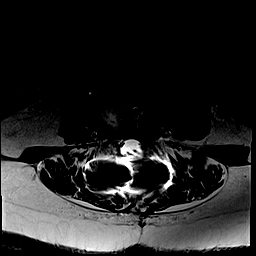
[im 21/30]
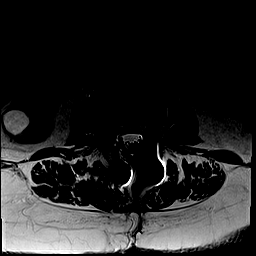
[im 25/30]
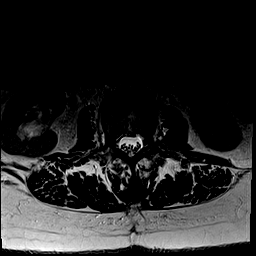
[im 30/30]
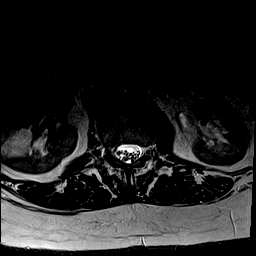

[Series 5: T1 · axial · 4.0mm · 0.35mm/px · z∈[-467,-273]mm · 8 of 30 slices shown (2 of 2)]
[im 1/30]
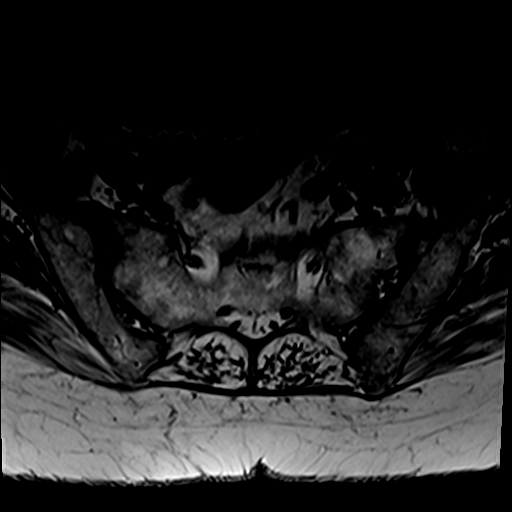
[im 5/30]
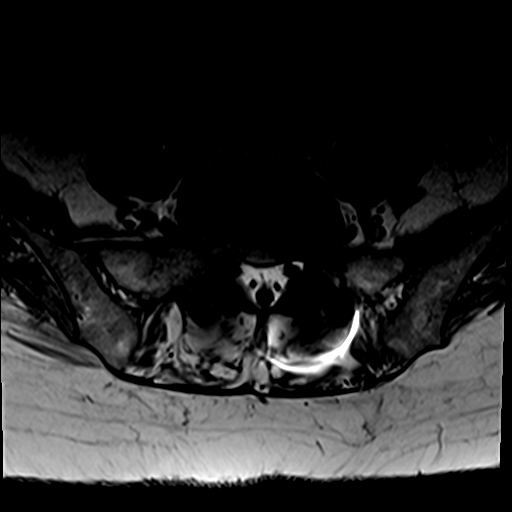
[im 9/30]
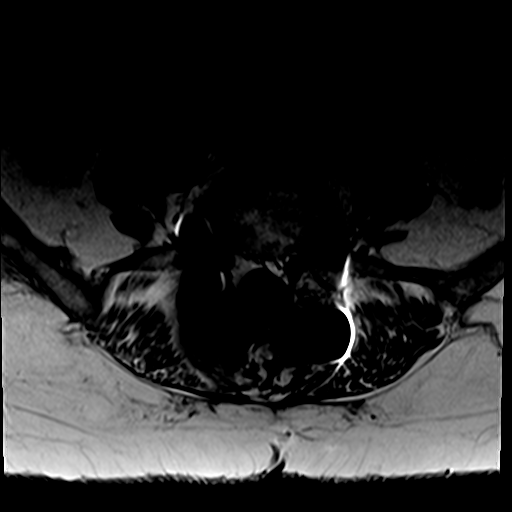
[im 14/30]
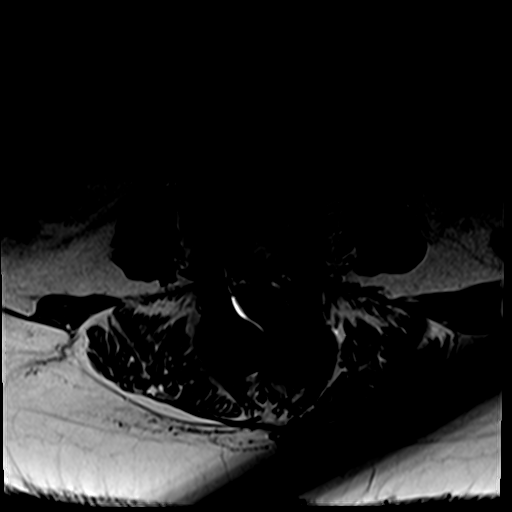
[im 16/30]
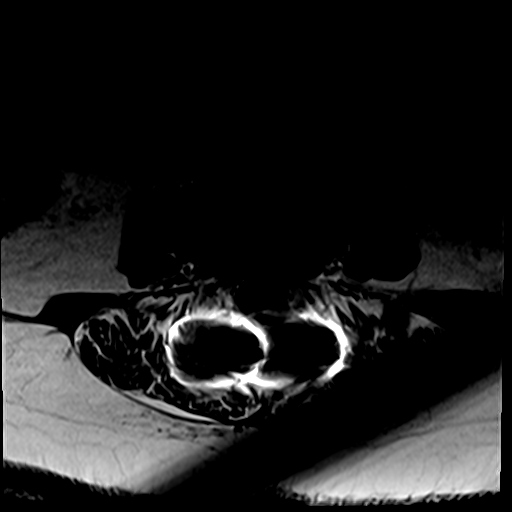
[im 21/30]
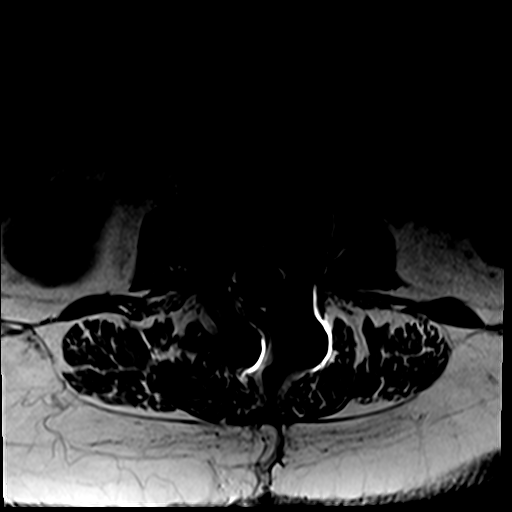
[im 25/30]
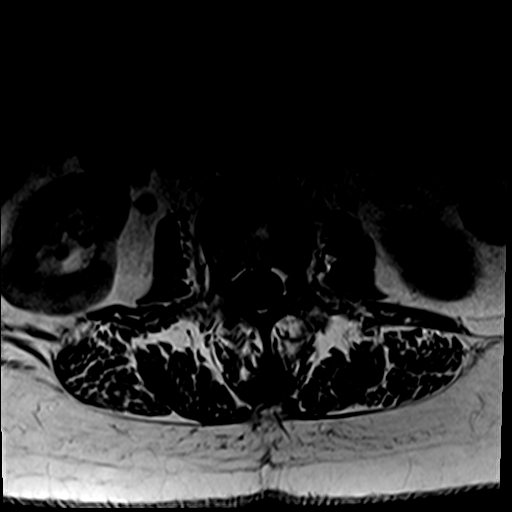
[im 30/30]
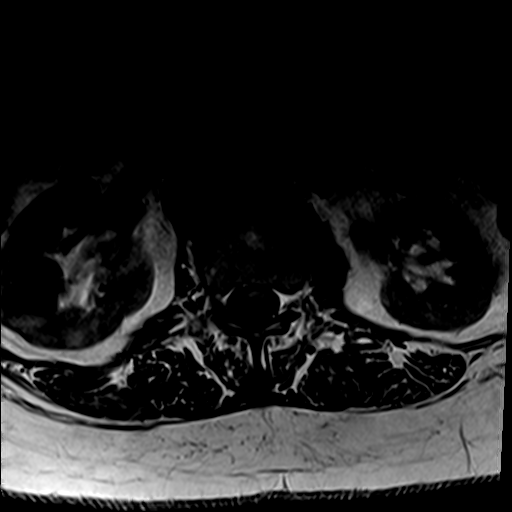

[32 of 48 positions shown; findings below may reference images not displayed]

FINDINGS: Segmentation:  5 lumbar type vertebral bodies.

Alignment: Normal except for 1 or 2 mm of retrolisthesis at L2-3 and
1 mm anterolisthesis L5-S1.

Vertebrae:  Previous PLIF L3 through L5.

Conus medullaris and cauda equina: Conus extends to the L1 level.
Conus and cauda equina appear normal.

Paraspinal and other soft tissues: Right renal cysts.

Disc levels:

T12-L1: Shallow disc protrusion indents the thecal sac does not
appear to affect the distal cord/conus.

L1-2: Disc bulge. Slight indentation of the thecal sac. No
compressive stenosis.

L2-3: 1 or 2 mm of retrolisthesis. Bilateral facet osteoarthritis.
Mild bulging of the disc. Mild narrowing of the lateral recesses and
foramina but without distinct neural compression. Findings at this
level could relate to back pain.

L3 through L5: Previous posterior decompression, diskectomy and
fusion surgery. Allowing for artifact from the fusion hardware, the
canal and foramina appear sufficiently patent.

L5-S1: Bilateral facet osteoarthritis with 1 mm of anterolisthesis.
Bulging of the disc. Mild stenosis of the subarticular lateral
recesses and neural foramina. Definite L5 or S1 nerve root
compression is not demonstrated, but neural irritation could occur.
The facet osteoarthritis could be painful.
IMPRESSION: 1. Good appearance in the fusion segment from L3 through L5.
2. L5-S1: Bilateral facet osteoarthritis with 1 mm of
anterolisthesis. Bulging of the disc. Mild stenosis of the
subarticular lateral recesses and neural foramina. Definite L5 or S1
nerve root compression is not demonstrated, but neural irritation
could occur. The facet osteoarthritis could be painful.
3. L2-3: 1 or 2 mm of retrolisthesis. Mild bulging of the disc.
Bilateral facet osteoarthritis. Mild narrowing of the lateral
recesses and foramina but without distinct neural compression.
Findings at this level could relate to back pain.
4. L1-2: Disc bulge. No compressive stenosis.

## 2020-05-14 DIAGNOSIS — M545 Low back pain, unspecified: Secondary | ICD-10-CM | POA: Diagnosis not present

## 2020-05-14 DIAGNOSIS — N390 Urinary tract infection, site not specified: Secondary | ICD-10-CM | POA: Diagnosis not present

## 2020-05-14 DIAGNOSIS — R300 Dysuria: Secondary | ICD-10-CM | POA: Diagnosis not present

## 2020-05-19 NOTE — Addendum Note (Signed)
Encounter addended by: Annie Paras on: 05/19/2020 5:27 PM  Actions taken: Letter saved

## 2020-05-22 DIAGNOSIS — M546 Pain in thoracic spine: Secondary | ICD-10-CM | POA: Diagnosis not present

## 2020-05-22 DIAGNOSIS — M545 Low back pain, unspecified: Secondary | ICD-10-CM | POA: Diagnosis not present

## 2020-06-05 DIAGNOSIS — M79674 Pain in right toe(s): Secondary | ICD-10-CM | POA: Diagnosis not present

## 2020-06-05 DIAGNOSIS — M79671 Pain in right foot: Secondary | ICD-10-CM | POA: Diagnosis not present

## 2020-06-05 DIAGNOSIS — I739 Peripheral vascular disease, unspecified: Secondary | ICD-10-CM | POA: Diagnosis not present

## 2020-06-05 DIAGNOSIS — M79672 Pain in left foot: Secondary | ICD-10-CM | POA: Diagnosis not present

## 2020-06-05 DIAGNOSIS — L110 Acquired keratosis follicularis: Secondary | ICD-10-CM | POA: Diagnosis not present

## 2020-06-05 DIAGNOSIS — M79675 Pain in left toe(s): Secondary | ICD-10-CM | POA: Diagnosis not present

## 2020-07-06 ENCOUNTER — Ambulatory Visit: Payer: Medicare Other | Admitting: Urology

## 2020-07-31 ENCOUNTER — Other Ambulatory Visit: Payer: Self-pay

## 2020-07-31 ENCOUNTER — Ambulatory Visit (HOSPITAL_COMMUNITY)
Admission: RE | Admit: 2020-07-31 | Discharge: 2020-07-31 | Disposition: A | Discharge status: Home / Self Care | Payer: Medicare Other | Source: Ambulatory Visit | Attending: Family Medicine | Admitting: Family Medicine

## 2020-07-31 ENCOUNTER — Other Ambulatory Visit (HOSPITAL_COMMUNITY): Payer: Self-pay | Admitting: Family Medicine

## 2020-07-31 DIAGNOSIS — R0609 Other forms of dyspnea: Secondary | ICD-10-CM | POA: Insufficient documentation

## 2020-07-31 DIAGNOSIS — R600 Localized edema: Secondary | ICD-10-CM | POA: Diagnosis not present

## 2020-07-31 DIAGNOSIS — R309 Painful micturition, unspecified: Secondary | ICD-10-CM | POA: Diagnosis not present

## 2020-07-31 DIAGNOSIS — R0600 Dyspnea, unspecified: Secondary | ICD-10-CM | POA: Diagnosis not present

## 2020-07-31 DIAGNOSIS — M545 Low back pain, unspecified: Secondary | ICD-10-CM | POA: Diagnosis not present

## 2020-07-31 IMAGING — DX DG CHEST 2V
2 series · 2 of 2 positions shown · non-contrast
Comparison: None.

CLINICAL DATA: Dyspnea

EXAM:
CHEST - 2 VIEW

[chest pa]
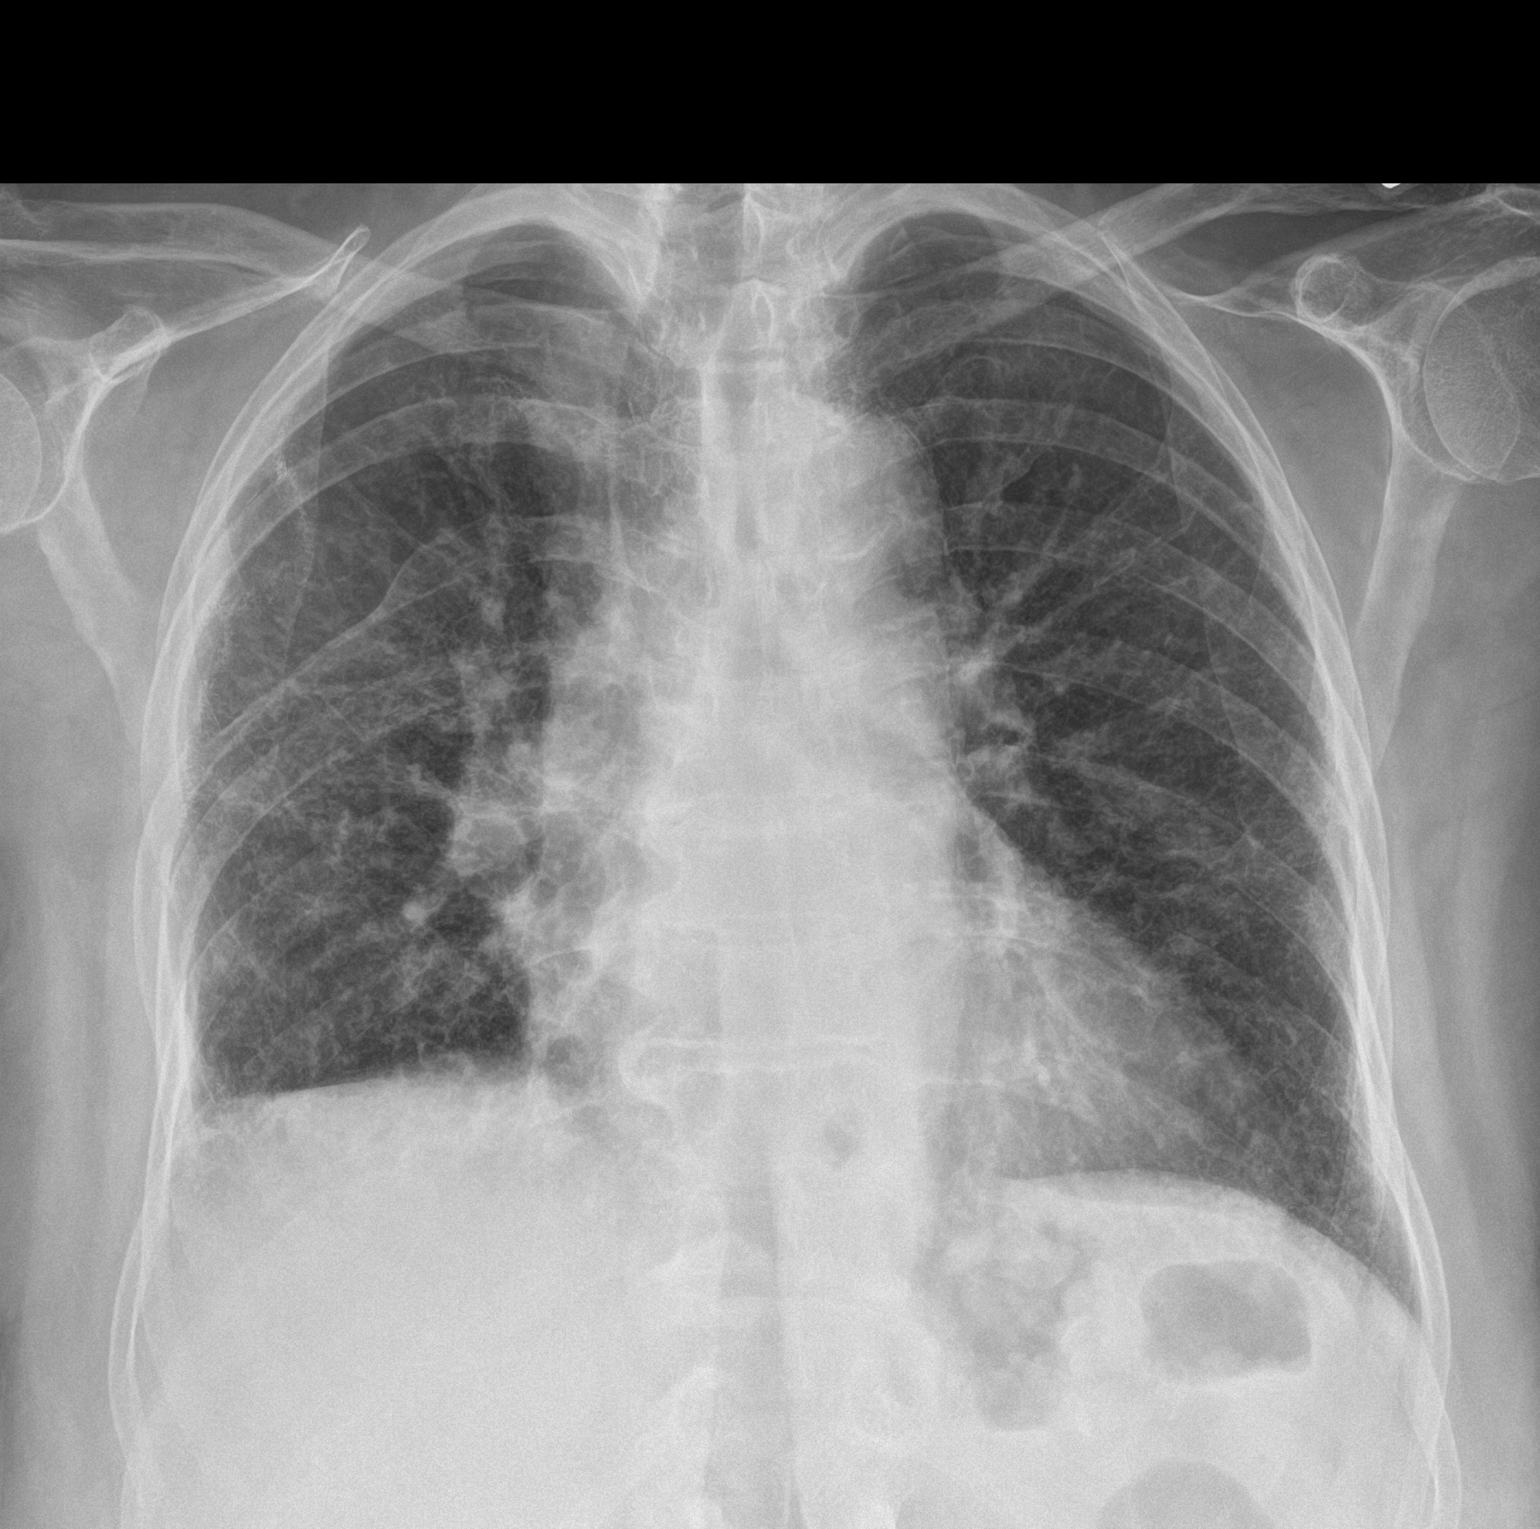

[chest lat]
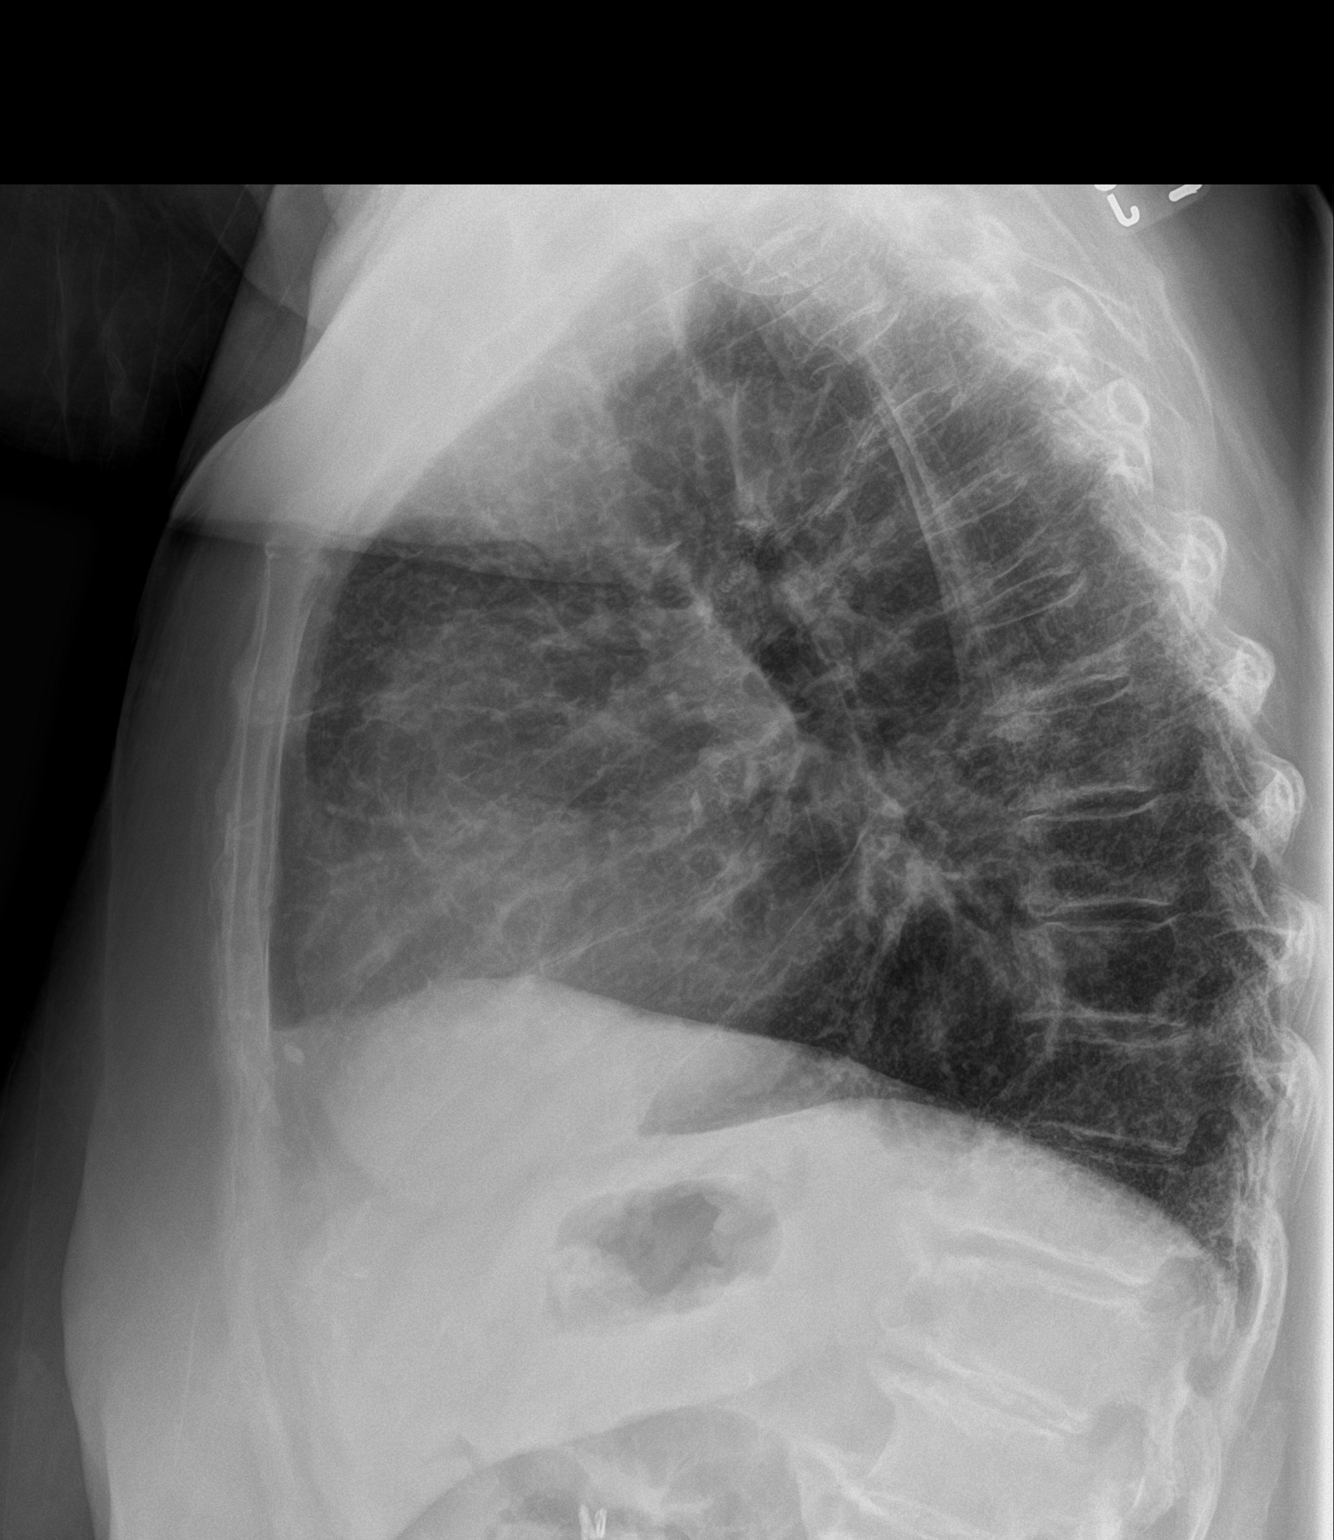

[2 of 2 positions shown; findings below may reference images not displayed]

FINDINGS: Partial right lung resection with associated right-sided volume loss
has been performed. There is interstitial thickening demonstrating a
peripheral and basilar predominance in keeping with changes of
underlying interstitial lung disease. No confluent pulmonary
infiltrate. No pneumothorax or pleural effusion. Cardiac size within
normal limits. Pulmonary vascularity is normal. No acute bone
abnormality.
IMPRESSION: Diffuse interstitial changes demonstrating a peripheral and basilar
predominance in keeping with underlying interstitial lung disease.
This would better assessed with high-resolution CT [HOSPITAL] the
chest.

## 2020-08-24 ENCOUNTER — Encounter: Payer: Self-pay | Admitting: Urology

## 2020-08-24 ENCOUNTER — Ambulatory Visit (INDEPENDENT_AMBULATORY_CARE_PROVIDER_SITE_OTHER): Payer: Medicare Other | Admitting: Urology

## 2020-08-24 ENCOUNTER — Other Ambulatory Visit: Payer: Self-pay

## 2020-08-24 VITALS — BP 168/88 | HR 66 | Temp 98.7°F

## 2020-08-24 DIAGNOSIS — N3000 Acute cystitis without hematuria: Secondary | ICD-10-CM

## 2020-08-24 DIAGNOSIS — N3281 Overactive bladder: Secondary | ICD-10-CM

## 2020-08-24 DIAGNOSIS — N3021 Other chronic cystitis with hematuria: Secondary | ICD-10-CM | POA: Diagnosis not present

## 2020-08-24 LAB — MICROSCOPIC EXAMINATION

## 2020-08-24 LAB — URINALYSIS, ROUTINE W REFLEX MICROSCOPIC
Bilirubin, UA: NEGATIVE
Glucose, UA: NEGATIVE
Ketones, UA: NEGATIVE
Nitrite, UA: POSITIVE — AB
Protein,UA: NEGATIVE
RBC, UA: NEGATIVE
Specific Gravity, UA: 1.01 (ref 1.005–1.030)
Urobilinogen, Ur: 0.2 mg/dL (ref 0.2–1.0)
pH, UA: 6 (ref 5.0–7.5)

## 2020-08-24 LAB — BLADDER SCAN AMB NON-IMAGING: Scan Result: 0

## 2020-08-24 MED ORDER — NITROFURANTOIN MONOHYD MACRO 100 MG PO CAPS: 100.0000 mg | ORAL_CAPSULE | Freq: Two times a day (BID) | ORAL | 0 refills | Status: DC

## 2020-08-24 MED ORDER — GEMTESA 75 MG PO TABS
1.0000 | ORAL_TABLET | Freq: Every day | ORAL | 0 refills | Status: DC
Start: 2020-08-24 — End: 2020-09-20

## 2020-08-24 NOTE — Progress Notes (Signed)
post void residual=0  Urological Symptom Review  Patient is experiencing the following symptoms: Hard to postpone urination Burning/pain with urination Get up at night to urinate Leakage of urine Stream starts and stops Trouble starting stream Urinary tract infection Weak stream   Review of Systems  Gastrointestinal (upper)  : Indigestion/heartburn  Gastrointestinal (lower) : Diarrhea  Constitutional : Fatigue  Skin: Itching  Eyes: Blurred vision  Ear/Nose/Throat : Sinus problems  Hematologic/Lymphatic: Easy bruising  Cardiovascular : Leg swelling  Respiratory : Shortness of breath  Endocrine: Negative for endocrine symptoms  Musculoskeletal: Back pain Joint pain  Neurological: Negative for neurological symptoms  Psychologic: Negative for psychiatric symptoms

## 2020-08-24 NOTE — Progress Notes (Signed)
08/24/2020 10:05 AM   Monique Robles February 16, 1941 [id number]  Referring provider: Celene Squibb, MD [address],  [place] [address]  Urinary incontinence   HPI: Monique Robles is a 80yo here for evaluation of urinary incontinence and recurrent UTI. She was previously followup by a urologist in [place], [place] for chronic UTI and had CT and cystoscopy which per patient was normal. She gets 6-7 UTIs per year. She has urge incontinence and uses 5-6 pads per day which are soaked. She uses 2 pads at night.  She was taking oxybutyin for several years and had to stop it due to starting eliquis. She then tried Detrol which failed to improve her incontinence. She notes worse incontinence on gabapentin. She has a hx of CVA in 2018. PVR 0cc.    PMH: Past Medical History:  Diagnosis Date   Aortic insufficiency    Atrial fibrillation (HCC)    Diastolic dysfunction    DOE (dyspnea on exertion)    GERD (gastroesophageal reflux disease)    History of pneumonia    History of pulmonary embolism    History of stroke    HTN (hypertension)    Hyperlipemia    Mitral valve regurgitation    Osteoporosis    Tremor    Vitamin D deficiency     Surgical History: Past Surgical History:  Procedure Laterality Date   BLADDER SURGERY     x 3   CERVICAL SPINE SURGERY     CHOLECYSTECTOMY     COLONOSCOPY     COLONOSCOPY WITH ESOPHAGOGASTRODUODENOSCOPY (EGD)     LUMBAR SPINE SURGERY     REPLACEMENT TOTAL KNEE Left    RIGHT LUNG WEDGE REMOVAL     TONSILLECTOMY      Home Medications:  Allergies as of 08/24/2020       Reactions   Coumadin [warfarin] Hives   Atorvastatin Other (See Comments)   Muscle aches   Ciprofloxacin    Sulfa Antibiotics Itching, Rash        Medication List        Accurate as of August 24, 2020 10:05 AM. If you have any questions, ask your nurse or doctor.          albuterol (2.5 MG/3ML) 0.083% nebulizer solution Commonly known as: PROVENTIL Take 2.5 mg  by nebulization every 4 (four) hours as needed.   albuterol 108 (90 Base) MCG/ACT inhaler Commonly known as: VENTOLIN HFA SMARTSIG:2 Puff(s) By Mouth Every 4 Hours PRN   apixaban 5 MG Tabs tablet Commonly known as: ELIQUIS Take 5 mg by mouth 2 (two) times daily.   APPLE CIDER VINEGAR PO Take 1 tablet by mouth daily.   BENADRYL PO Take 1 tablet by mouth as needed (seasonal allergies).   calcium carbonate 500 MG chewable tablet Commonly known as: TUMS - dosed in mg elemental calcium Chew 1 tablet by mouth daily as needed for indigestion or heartburn.   cetirizine 10 MG tablet Commonly known as: ZYRTEC Take 10 mg by mouth daily.   furosemide 20 MG tablet Commonly known as: LASIX Take 20 mg by mouth daily.   gabapentin 600 MG tablet Commonly known as: NEURONTIN Take 600 mg by mouth 3 (three) times daily.   HYDROcodone-acetaminophen 5-325 MG tablet Commonly known as: NORCO/VICODIN Take 1 tablet by mouth 2 (two) times daily as needed.   Melatonin 10 MG Tabs Take 10 mg by mouth at bedtime.   mirabegron ER 25 MG Tb24 tablet Commonly known as: MYRBETRIQ  Take 25 mg by mouth daily.   multivitamin tablet Take 1 tablet by mouth daily.   Nystatin Powd 2 application by Does not apply route as needed.   omeprazole 40 MG capsule Commonly known as: PRILOSEC Take 1 capsule by mouth daily.   Potassium Chloride ER 20 MEQ Tbcr Take 1 tablet by mouth daily.   PROBIOTIC PO Take 1 tablet by mouth daily.   propranolol 40 MG tablet Commonly known as: INDERAL Take 1 tablet (40 mg total) by mouth 2 (two) times daily.   TYLENOL ARTHRITIS PAIN PO Take 650 mg by mouth daily as needed.   VITAMIN D PO Take 1,000 Units by mouth daily.        Allergies:  Allergies  Allergen Reactions   Coumadin [Warfarin] Hives   Atorvastatin Other (See Comments)    Muscle aches   Ciprofloxacin    Sulfa Antibiotics Itching and Rash    Family History: Family History  Problem Relation  Age of Onset   Alzheimer's disease Mother        died at 27   Cancer Mother    Tremor Mother    Pneumonia Father    Heart disease Father    Diabetes Father    Heart attack Father        died at 63    Social History:  reports that she has never smoked. She has never used smokeless tobacco. She reports previous alcohol use. She reports that she does not use drugs.  ROS: All other review of systems were reviewed and are negative except what is noted above in HPI  Physical Exam: BP (!) 168/88   Pulse 66   Temp 98.7 F (37.1 C)   Constitutional:  Alert and oriented, No acute distress. HEENT: Monique Robles AT, moist mucus membranes.  Trachea midline, no masses. Cardiovascular: No clubbing, cyanosis, or edema. Respiratory: Normal respiratory effort, no increased work of breathing. GI: Abdomen is soft, nontender, nondistended, no abdominal masses GU: No CVA tenderness.  Lymph: No cervical or inguinal lymphadenopathy. Skin: No rashes, bruises or suspicious lesions. Neurologic: Grossly intact, no focal deficits, moving all 4 extremities. Psychiatric: Normal mood and affect.  Laboratory Data: No results found for: WBC, HGB, HCT, MCV, PLT  No results found for: CREATININE  No results found for: PSA  No results found for: TESTOSTERONE  No results found for: HGBA1C  Urinalysis    Component Value Date/Time   APPEARANCEUR Hazy (A) 08/24/2020 0937   GLUCOSEU Negative 08/24/2020 0937   BILIRUBINUR Negative 08/24/2020 0937   PROTEINUR Negative 08/24/2020 0937   NITRITE Positive (A) 08/24/2020 0937   LEUKOCYTESUR 1+ (A) 08/24/2020 0937    Lab Results  Component Value Date   LABMICR See below: 08/24/2020   WBCUA 11-30 (A) 08/24/2020   LABEPIT 0-10 08/24/2020   BACTERIA Moderate (A) 08/24/2020    Pertinent Imaging:  No results found for this or any previous visit.  No results found for this or any previous visit.  No results found for this or any previous visit.  No results  found for this or any previous visit.  No results found for this or any previous visit.  No results found for this or any previous visit.  No results found for this or any previous visit.  No results found for this or any previous visit.   Assessment & Plan:    1. Overactive bladder gemtesa - Urinalysis, Routine w reflex microscopic - BLADDER SCAN AMB NON-IMAGING  2. Chronic cystitis -Urine  for culture -Macrobid 100mg  BID for 7 days -We will start prophylaxis based on urine culture results.  No follow-ups on file.  Nicolette Bang, MD  [facility] [place]

## 2020-08-24 NOTE — Patient Instructions (Signed)

## 2020-08-27 LAB — URINE CULTURE

## 2020-08-30 NOTE — Progress Notes (Signed)
Cardiology Office Note   Date:  08/31/2020   ID:  Cheryal, Salas 1940-04-23, MRN [id number]  PCP:  Celene Squibb, MD  Cardiologist:   Dorris Carnes, MD   Pt referred for DOE      History of Present Illness: Monique Robles is a 80 y.o. female with a history of Atrial fibrillation, aortic insufficiency, CVA (2018), PE (approx 2001), HTN, HL and MR She used to live in [facility] and had a cardiologist there [place] remember when last seen   She moved to [place] over 1 year ago to live with her daughter     The pt and her daughter say she gets SOB with minimal activity   This has been getting worse esp over the last couple months    Used to be able to go up a fl of stairs to eat dinner with family ([place])  Now too fatiguing    Sitting on stool in shower she gets tired just moving wash cloth.   She could do both 1 year ago  Patient will get some chest heaviness even with sitting   She has had PND in past but not now   Gets up often at night to go to the batthroom Occasional dizziness, presyncope but never syncope     The pt was seen earlier for LE edema that had developed (new)   She was placed on furosemide and it has improved some   Current Meds  Medication Sig   Acetaminophen (TYLENOL ARTHRITIS PAIN PO) Take 650 mg by mouth daily as needed.    albuterol (PROVENTIL) (2.5 MG/3ML) 0.083% nebulizer solution Take 2.5 mg by nebulization every 4 (four) hours as needed.   albuterol (VENTOLIN HFA) 108 (90 Base) MCG/ACT inhaler SMARTSIG:2 Puff(s) By Mouth Every 4 Hours PRN   apixaban (ELIQUIS) 5 MG TABS tablet Take 5 mg by mouth 2 (two) times daily.   calcium carbonate (TUMS - DOSED IN MG ELEMENTAL CALCIUM) 500 MG chewable tablet Chew 1 tablet by mouth daily as needed for indigestion or heartburn.   diphenhydrAMINE HCl (BENADRYL PO) Take 1 tablet by mouth as needed (seasonal allergies).   furosemide (LASIX) 20 MG tablet Take 20 mg by mouth daily.   gabapentin (NEURONTIN) 600 MG tablet Take 600  mg by mouth 3 (three) times daily.   HYDROcodone-acetaminophen (NORCO/VICODIN) 5-325 MG tablet Take 1 tablet by mouth 2 (two) times daily as needed.   Multiple Vitamin (MULTIVITAMIN) tablet Take 1 tablet by mouth daily.   nitrofurantoin, macrocrystal-monohydrate, (MACROBID) 100 MG capsule Take 1 capsule (100 mg total) by mouth every 12 (twelve) hours.   Nystatin POWD 2 application by Does not apply route as needed.   omeprazole (PRILOSEC) 40 MG capsule Take 1 capsule by mouth daily.   Potassium Chloride ER 20 MEQ TBCR Take 1 tablet by mouth daily.   Probiotic Product (PROBIOTIC PO) Take 1 tablet by mouth daily.   propranolol (INDERAL) 40 MG tablet Take 1 tablet (40 mg total) by mouth 2 (two) times daily.   Vibegron (GEMTESA) 75 MG TABS Take 1 capsule by mouth daily.   VITAMIN D PO Take 1,000 Units by mouth daily.     Allergies:   Coumadin [warfarin], Atorvastatin, Ciprofloxacin, and Sulfa antibiotics   Past Medical History:  Diagnosis Date   Aortic insufficiency    Atrial fibrillation (HCC)    Diastolic dysfunction    DOE (dyspnea on exertion)    GERD (gastroesophageal reflux disease)    History  of pneumonia    History of pulmonary embolism    History of stroke    HTN (hypertension)    Hyperlipemia    Mitral valve regurgitation    Osteoporosis    Tremor    Vitamin D deficiency     Past Surgical History:  Procedure Laterality Date   BLADDER SURGERY     x 3   CERVICAL SPINE SURGERY     CHOLECYSTECTOMY     COLONOSCOPY     COLONOSCOPY WITH ESOPHAGOGASTRODUODENOSCOPY (EGD)     LUMBAR SPINE SURGERY     REPLACEMENT TOTAL KNEE Left    RIGHT LUNG WEDGE REMOVAL     TONSILLECTOMY       Social History:  The patient  reports that she has never smoked. She has never used smokeless tobacco. She reports previous alcohol use. She reports that she does not use drugs.   Family History:  The patient's family history includes Alzheimer's disease in her mother; Cancer in her mother;  Diabetes in her father; Heart attack in her father; Heart disease in her father; Pneumonia in her father; Tremor in her mother.    ROS:  Please see the history of present illness. All other systems are reviewed and  Negative to the above problem except as noted.    PHYSICAL EXAM: VS:  BP 112/66   Pulse 60   Ht 5\' 1"  (1.549 m)   Wt 181 lb (82.1 kg)   SpO2 98%   BMI 34.20 kg/m   Sats reportedly low when just walked in 80s%  Not offic recorded Walking in hallway of office sats 94%  HR remained 50s, then 40s then 60   Very SOB  GEN: Well nourished, well developed, in no acute distress  BuT SOB when getting onto exam table   HEENT: normal  Neck: no obvious JVD, carotid bruits  Cardiac: RRR; no murmurs,    1+ LE edema  Respiratory:  clear to auscultation bilaterally, normal work of breathing GI: soft, nontender, nondistended, + BS  No hepatomegaly  MS: no deformity Moving all extremities   Skin: warm and dry, no rash Neuro:  Strength and sensation are intact Psych: euthymic mood, full affect   EKG:  EKG is ordered today.  SB 58 bpm   Poor R wave progression    Lipid Panel    Component Value Date/Time   CHOL 229 (H) 08/31/2020 0948   TRIG 92 08/31/2020 0948   HDL 64 08/31/2020 0948   CHOLHDL 3.6 08/31/2020 0948   VLDL 18 08/31/2020 0948   LDLCALC 147 (H) 08/31/2020 0948      Wt Readings from Last 3 Encounters:  08/31/20 181 lb (82.1 kg)  08/15/19 189 lb 8 oz (86 kg)      ASSESSMENT AND PLAN:  1  Dyspnea   This has been progressive   Pt is markedly SOB with minimal activity    Walking in hal her heart rate does not increase  ? If this is contributing      VOlume is probably up some but would review labs     REcom:  Zio patch to evaluate HR, r/o pauses Echo to evaluate systolic /diastolic function Get records from [facility] re previous testing  Activities as tolerated   2  Hx  PAF  Current in SR   Keep on Eliquis  3  HTN  BP is OK even on my check   130s/   Daughter says  it can be higher at home  Need to follow   4  HL  LDL 153  HDL 65  Trig 119   Chol 239   Need to get records for cardiac testing   Discussed diet     Current medicines are reviewed at length with the patient today.  The patient does not have concerns regarding medicines.  Signed, Dorris Carnes, MD  08/31/2020 12:47 PM    [address], [place],   [address] Phone: [phone]; Fax: [phone]

## 2020-08-31 ENCOUNTER — Ambulatory Visit (INDEPENDENT_AMBULATORY_CARE_PROVIDER_SITE_OTHER): Payer: Medicare Other

## 2020-08-31 ENCOUNTER — Encounter: Payer: Self-pay | Admitting: Internal Medicine

## 2020-08-31 ENCOUNTER — Other Ambulatory Visit: Payer: Self-pay

## 2020-08-31 ENCOUNTER — Other Ambulatory Visit (HOSPITAL_COMMUNITY)
Admission: RE | Admit: 2020-08-31 | Discharge: 2020-08-31 | Disposition: A | Discharge status: Home / Self Care | Payer: Medicare Other | Source: Ambulatory Visit | Attending: Internal Medicine | Admitting: Internal Medicine

## 2020-08-31 ENCOUNTER — Other Ambulatory Visit: Payer: Self-pay | Admitting: Internal Medicine

## 2020-08-31 ENCOUNTER — Ambulatory Visit: Payer: Medicare Other | Admitting: Internal Medicine

## 2020-08-31 VITALS — BP 112/66 | HR 60 | Ht 61.0 in | Wt 181.0 lb

## 2020-08-31 DIAGNOSIS — R0609 Other forms of dyspnea: Secondary | ICD-10-CM

## 2020-08-31 DIAGNOSIS — E785 Hyperlipidemia, unspecified: Secondary | ICD-10-CM | POA: Insufficient documentation

## 2020-08-31 DIAGNOSIS — R0600 Dyspnea, unspecified: Secondary | ICD-10-CM

## 2020-08-31 DIAGNOSIS — R001 Bradycardia, unspecified: Secondary | ICD-10-CM

## 2020-08-31 LAB — COMPREHENSIVE METABOLIC PANEL
ALT: 9 U/L (ref 0–44)
AST: 20 U/L (ref 15–41)
Albumin: 4.1 g/dL (ref 3.5–5.0)
Alkaline Phosphatase: 86 U/L (ref 38–126)
Anion gap: 7 (ref 5–15)
BUN: 23 mg/dL (ref 8–23)
CO2: 24 mmol/L (ref 22–32)
Calcium: 9.1 mg/dL (ref 8.9–10.3)
Chloride: 106 mmol/L (ref 98–111)
Creatinine, Ser: 0.73 mg/dL (ref 0.44–1.00)
GFR, Estimated: >60 mL/min (ref >60)
Glucose, Bld: 93 mg/dL (ref 70–99)
Potassium: 4.4 mmol/L (ref 3.5–5.1)
Sodium: 137 mmol/L (ref 135–145)
Total Bilirubin: 0.7 mg/dL (ref 0.3–1.2)
Total Protein: 7.3 g/dL (ref 6.5–8.1)

## 2020-08-31 LAB — LIPID PANEL
Cholesterol: 229 mg/dL — ABNORMAL HIGH (ref 0–200)
HDL: 64 mg/dL (ref >40)
LDL Cholesterol: 147 mg/dL — ABNORMAL HIGH (ref 0–99)
Total CHOL/HDL Ratio: 3.6 RATIO
Triglycerides: 92 mg/dL (ref <150)
VLDL: 18 mg/dL (ref 0–40)

## 2020-08-31 LAB — CBC
HCT: 40.5 % (ref 36.0–46.0)
Hemoglobin: 12.3 g/dL (ref 12.0–15.0)
MCH: 29.8 pg (ref 26.0–34.0)
MCHC: 30.4 g/dL (ref 30.0–36.0)
MCV: 98.1 fL (ref 80.0–100.0)
Platelets: 362 10*3/uL (ref 150–400)
RBC: 4.13 MIL/uL (ref 3.87–5.11)
RDW: 13.3 % (ref 11.5–15.5)
WBC: 6.4 10*3/uL (ref 4.0–10.5)
nRBC: 0 % (ref 0.0–0.2)

## 2020-08-31 LAB — BRAIN NATRIURETIC PEPTIDE: B Natriuretic Peptide: 89 pg/mL (ref 0.0–100.0)

## 2020-08-31 LAB — TSH: TSH: 2.159 u[IU]/mL (ref 0.350–4.500)

## 2020-08-31 NOTE — Patient Instructions (Signed)
Medication Instructions:  Your physician recommends that you continue on your current medications as directed. Please refer to the Current Medication list given to you today.  *If you need a refill on your cardiac medications before your next appointment, please call your pharmacy*   Lab Work: Your physician recommends that you return for lab work in: Today ( CBC, Lipid, CMET, TSH, BNP)   If you have labs (blood work) drawn today and your tests are completely normal, you will receive your results only by: MyChart Message (if you have MyChart) OR A paper copy in the mail If you have any lab test that is abnormal or we need to change your treatment, we will call you to review the results.   Testing/Procedures: Your physician has requested that you have an echocardiogram. Echocardiography is a painless test that uses sound waves to create images of your heart. It provides your doctor with information about the size and shape of your heart and how well your heart's chambers and valves are working. This procedure takes approximately one hour. There are no restrictions for this procedure.  ZIO XT- Long Term Monitor Instructions   Your physician has requested you wear your ZIO patch monitor___2____days.   This is a single patch monitor.  [employer] supplies one patch monitor per enrollment.  Additional stickers are not available.   Please do not apply patch if you will be having a Nuclear Stress Test, Echocardiogram, Cardiac CT, MRI, or Chest Xray during the time frame you would be wearing the monitor. The patch cannot be worn during these tests.  You cannot remove and re-apply the ZIO XT patch monitor.   Your ZIO patch monitor will be sent USPS Priority mail from [facility] directly to your home address. The monitor may also be mailed to a PO BOX if home delivery is not available.   It may take 3-5 days to receive your monitor after you have been enrolled.   Once you have received you  monitor, please review enclosed instructions.  Your monitor has already been registered assigning a specific monitor serial # to you.   Applying the monitor   Shave hair from upper left chest.   Hold abrader disc by orange tab.  Rub abrader in 40 strokes over left upper chest as indicated in your monitor instructions.   Clean area with 4 enclosed alcohol pads .  Use all pads to assure are is cleaned thoroughly.  Let dry.   Apply patch as indicated in monitor instructions.  Patch will be place under collarbone on left side of chest with arrow pointing upward.   Rub patch adhesive wings for 2 minutes.Remove white label marked "1".  Remove white label marked "2".  Rub patch adhesive wings for 2 additional minutes.   While looking in a mirror, press and release button in center of patch.  A small green light will flash 3-4 times .  This will be your only indicator the monitor has been turned on.     Do not shower for the first 24 hours.  You may shower after the first 24 hours.   Press button if you feel a symptom. You will hear a small click.  Record Date, Time and Symptom in the Patient Log Book.   When you are ready to remove patch, follow instructions on last 2 pages of Patient Log Book.  Stick patch monitor onto last page of Patient Log Book.   Place Patient Log Book in [place] box.  Use locking tab on box and tape box closed securely.  The [employer] has [employer] on it.  Please place in mailbox as soon as possible.  Your physician should have your test results approximately 7 days after the monitor has been mailed back to [facility].   Call [place] at [phone] if you have questions regarding your ZIO XT patch monitor.  Call them immediately if you see an orange light blinking on your monitor.   If your monitor falls off in less than 4 days contact our Monitor department at [phone].  If your monitor becomes loose or falls off after 4 days  call [employer] at [phone] for suggestions on securing your monitor.     Follow-Up: At [facility], you and your health needs are our priority.  As part of our continuing mission to provide you with exceptional heart care, we have created designated Provider Care Teams.  These Care Teams include your primary Cardiologist (physician) and Advanced Practice Providers (APPs -  Physician Assistants and Nurse Practitioners) who all work together to provide you with the care you need, when you need it.  We recommend signing up for the patient portal called "MyChart".  Sign up information is provided on this After Visit Summary.  MyChart is used to connect with patients for Virtual Visits (Telemedicine).  Patients are able to view lab/test results, encounter notes, upcoming appointments, etc.  Non-urgent messages can be sent to your provider as well.   To learn more about what you can do with MyChart, go to [email or web address].    Your next appointment:    Pending Test Results   The format for your next appointment:   In Person  Provider:   Dorris Carnes, MD   Other Instructions Thank you for choosing [facility]!

## 2020-09-02 DIAGNOSIS — R001 Bradycardia, unspecified: Secondary | ICD-10-CM

## 2020-09-07 ENCOUNTER — Telehealth: Payer: Self-pay

## 2020-09-07 DIAGNOSIS — R001 Bradycardia, unspecified: Secondary | ICD-10-CM | POA: Diagnosis not present

## 2020-09-07 NOTE — Telephone Encounter (Signed)
-----   Message from Cleon Gustin, MD sent at 09/03/2020 10:51 AM EDT ----- Macrobid 100mg  BID for 7 days ----- Message ----- From: Dorisann Frames, RN Sent: 08/27/2020   8:40 AM EDT To: Cleon Gustin, MD

## 2020-09-07 NOTE — Telephone Encounter (Signed)
Spoke with daughter. Patient completed antibiotic therapy.

## 2020-09-08 NOTE — Addendum Note (Signed)
Encounter addended by: Annie Paras on: 09/08/2020 3:07 PM  Actions taken: Letter saved

## 2020-09-13 ENCOUNTER — Other Ambulatory Visit: Payer: Self-pay

## 2020-09-13 ENCOUNTER — Ambulatory Visit (HOSPITAL_COMMUNITY)
Admission: RE | Admit: 2020-09-13 | Discharge: 2020-09-13 | Disposition: A | Discharge status: Home / Self Care | Payer: Medicare Other | Source: Ambulatory Visit | Attending: Internal Medicine | Admitting: Internal Medicine

## 2020-09-13 DIAGNOSIS — R0609 Other forms of dyspnea: Secondary | ICD-10-CM | POA: Diagnosis not present

## 2020-09-13 DIAGNOSIS — R0600 Dyspnea, unspecified: Secondary | ICD-10-CM | POA: Diagnosis not present

## 2020-09-13 NOTE — Progress Notes (Signed)
*  PRELIMINARY RESULTS* Echocardiogram 2D Echocardiogram has been performed.  Monique Robles 09/13/2020, 4:12 PM

## 2020-09-14 LAB — ECHOCARDIOGRAM COMPLETE
AR max vel: 1.55 cm2
AV Area VTI: 1.59 cm2
AV Area mean vel: 1.65 cm2
AV Mean grad: 10 mmHg
AV Peak grad: 19.7 mmHg
Ao pk vel: 2.22 m/s
Area-P 1/2: 2.45 cm2
MV M vel: 5.28 m/s
MV Peak grad: 111.5 mmHg
P 1/2 time: 736 msec
S' Lateral: 2.8 cm

## 2020-09-17 ENCOUNTER — Telehealth: Payer: Self-pay | Admitting: Internal Medicine

## 2020-09-17 DIAGNOSIS — R0600 Dyspnea, unspecified: Secondary | ICD-10-CM

## 2020-09-17 DIAGNOSIS — R0609 Other forms of dyspnea: Secondary | ICD-10-CM

## 2020-09-17 NOTE — Telephone Encounter (Signed)
Daughter says pt is not able to walk on treadmilll I would like to schedule a lexiscan myoview to r/o ischemia as cause for SOB

## 2020-09-18 NOTE — Telephone Encounter (Signed)
Daughter notified to expect a call to schedule lexiscan.

## 2020-09-18 NOTE — Telephone Encounter (Signed)
Called Pt no answer left msg to call back. Order placed.

## 2020-09-18 NOTE — Addendum Note (Signed)
Addended by: Levonne Hubert on: 09/18/2020 08:29 AM   Modules accepted: Orders

## 2020-09-20 ENCOUNTER — Other Ambulatory Visit: Payer: Self-pay

## 2020-09-20 ENCOUNTER — Telehealth: Payer: Self-pay | Admitting: Urology

## 2020-09-20 DIAGNOSIS — N3021 Other chronic cystitis with hematuria: Secondary | ICD-10-CM

## 2020-09-20 MED ORDER — GEMTESA 75 MG PO TABS: 1.0000 | ORAL_TABLET | Freq: Every day | ORAL | 11 refills | Status: DC

## 2020-09-20 NOTE — Telephone Encounter (Signed)
Rx sent 

## 2020-09-20 NOTE — Telephone Encounter (Signed)
Pt called today and needs a RX for [place] sent in to [facility].

## 2020-09-28 ENCOUNTER — Ambulatory Visit (INDEPENDENT_AMBULATORY_CARE_PROVIDER_SITE_OTHER): Payer: Medicare Other | Admitting: Urology

## 2020-09-28 ENCOUNTER — Encounter: Payer: Self-pay | Admitting: Urology

## 2020-09-28 ENCOUNTER — Other Ambulatory Visit: Payer: Self-pay

## 2020-09-28 VITALS — BP 179/96 | HR 60 | Temp 98.4°F

## 2020-09-28 DIAGNOSIS — N3281 Overactive bladder: Secondary | ICD-10-CM | POA: Diagnosis not present

## 2020-09-28 DIAGNOSIS — N3021 Other chronic cystitis with hematuria: Secondary | ICD-10-CM | POA: Diagnosis not present

## 2020-09-28 LAB — URINALYSIS, ROUTINE W REFLEX MICROSCOPIC
Bilirubin, UA: NEGATIVE
Glucose, UA: NEGATIVE
Ketones, UA: NEGATIVE
Nitrite, UA: NEGATIVE
Protein,UA: NEGATIVE
Specific Gravity, UA: <1.005 — ABNORMAL LOW (ref 1.005–1.030)
Urobilinogen, Ur: 0.2 mg/dL (ref 0.2–1.0)
pH, UA: 5.5 (ref 5.0–7.5)

## 2020-09-28 LAB — MICROSCOPIC EXAMINATION: Renal Epithel, UA: NONE SEEN /hpf

## 2020-09-28 MED ORDER — NITROFURANTOIN MACROCRYSTAL 50 MG PO CAPS: 50.0000 mg | ORAL_CAPSULE | Freq: Every day | ORAL | 11 refills | Status: DC

## 2020-09-28 MED ORDER — TROSPIUM CHLORIDE ER 60 MG PO CP24: 1.0000 | ORAL_CAPSULE | Freq: Every day | ORAL | 0 refills | Status: DC

## 2020-09-28 NOTE — Patient Instructions (Signed)

## 2020-09-28 NOTE — Progress Notes (Signed)
Urological Symptom Review  Patient is experiencing the following symptoms: Frequent urination Hard to postpone urination Burning/pain with urination Get up at night to urinate Leakage of urine Urinary tract infection   Review of Systems  Gastrointestinal (upper)  : Indigestion/heartburn  Gastrointestinal (lower) : Negative for lower GI symptoms  Constitutional : Fatigue  Skin: Negative for skin symptoms  Eyes: Blurred vision Double vision  Ear/Nose/Throat : Negative for Ear/Nose/Throat symptoms  Hematologic/Lymphatic: Negative for Hematologic/Lymphatic symptoms  Cardiovascular : Leg swelling  Respiratory : Shortness of breath  Endocrine: Negative for endocrine symptoms  Musculoskeletal: Back pain  Neurological: Headaches  Psychologic: Negative for psychiatric symptoms

## 2020-09-28 NOTE — Progress Notes (Signed)
09/28/2020 11:41 AM   Monique Robles 04-06-1940 [id number]  Referring provider: Noreene Larsson, NP [address]  [facility] [address] [place],  [place] [address]  Followup recurrent UTI and OAB   HPI: Ms Cooksley is a 80yo here for followup for recurrent UTI and OAB. Last urine culture fro 08/2020 was ecoli sensitive to macrobid. She finished a 7 day course and her LUTS improved. No dysuria currently. She continues to have urinary frequency and urgency. She has urge incontinence daily. She tried mirabegron which failed to improve her LUTS.    PMH: Past Medical History:  Diagnosis Date   Aortic insufficiency    Atrial fibrillation (HCC)    Diastolic dysfunction    DOE (dyspnea on exertion)    GERD (gastroesophageal reflux disease)    History of pneumonia    History of pulmonary embolism    History of stroke    HTN (hypertension)    Hyperlipemia    Mitral valve regurgitation    Osteoporosis    Tremor    Vitamin D deficiency     Surgical History: Past Surgical History:  Procedure Laterality Date   BLADDER SURGERY     x 3   CERVICAL SPINE SURGERY     CHOLECYSTECTOMY     COLONOSCOPY     COLONOSCOPY WITH ESOPHAGOGASTRODUODENOSCOPY (EGD)     LUMBAR SPINE SURGERY     REPLACEMENT TOTAL KNEE Left    RIGHT LUNG WEDGE REMOVAL     TONSILLECTOMY      Home Medications:  Allergies as of 09/28/2020       Reactions   Coumadin [warfarin] Hives   Atorvastatin Other (See Comments)   Muscle aches   Ciprofloxacin    Sulfa Antibiotics Itching, Rash        Medication List        Accurate as of September 28, 2020 11:41 AM. If you have any questions, ask your nurse or doctor.          albuterol (2.5 MG/3ML) 0.083% nebulizer solution Commonly known as: PROVENTIL Take 2.5 mg by nebulization every 4 (four) hours as needed.   albuterol 108 (90 Base) MCG/ACT inhaler Commonly known as: VENTOLIN HFA SMARTSIG:2 Puff(s) By Mouth Every 4 Hours PRN   apixaban 5 MG Tabs  tablet Commonly known as: ELIQUIS Take 5 mg by mouth 2 (two) times daily.   BENADRYL PO Take 1 tablet by mouth as needed (seasonal allergies).   calcium carbonate 500 MG chewable tablet Commonly known as: TUMS - dosed in mg elemental calcium Chew 1 tablet by mouth daily as needed for indigestion or heartburn.   furosemide 20 MG tablet Commonly known as: LASIX Take 20 mg by mouth daily.   gabapentin 600 MG tablet Commonly known as: NEURONTIN Take 600 mg by mouth 3 (three) times daily.   Gemtesa 75 MG Tabs Generic drug: Vibegron Take 1 capsule by mouth daily.   HYDROcodone-acetaminophen 5-325 MG tablet Commonly known as: NORCO/VICODIN Take 1 tablet by mouth 2 (two) times daily as needed.   Melatonin 10 MG Tabs Take 10 mg by mouth at bedtime.   multivitamin tablet Take 1 tablet by mouth daily.   nitrofurantoin (macrocrystal-monohydrate) 100 MG capsule Commonly known as: MACROBID Take 1 capsule (100 mg total) by mouth every 12 (twelve) hours.   nitrofurantoin 50 MG capsule Commonly known as: MACRODANTIN Take 1 capsule (50 mg total) by mouth at bedtime. Started by: Nicolette Bang, MD   Nystatin Powd 2 application by Does not  apply route as needed.   omeprazole 40 MG capsule Commonly known as: PRILOSEC Take 1 capsule by mouth daily.   Potassium Chloride ER 20 MEQ Tbcr Take 1 tablet by mouth daily.   PROBIOTIC PO Take 1 tablet by mouth daily.   propranolol 40 MG tablet Commonly known as: INDERAL Take 1 tablet (40 mg total) by mouth 2 (two) times daily.   Trospium Chloride 60 MG Cp24 Take 1 capsule (60 mg total) by mouth daily. Started by: Nicolette Bang, MD   TYLENOL ARTHRITIS PAIN PO Take 650 mg by mouth daily as needed.   VITAMIN D PO Take 1,000 Units by mouth daily.        Allergies:  Allergies  Allergen Reactions   Coumadin [Warfarin] Hives   Atorvastatin Other (See Comments)    Muscle aches   Ciprofloxacin    Sulfa Antibiotics Itching  and Rash    Family History: Family History  Problem Relation Age of Onset   Alzheimer's disease Mother        died at 76   Cancer Mother    Tremor Mother    Pneumonia Father    Heart disease Father    Diabetes Father    Heart attack Father        died at 74    Social History:  reports that she has never smoked. She has never used smokeless tobacco. She reports previous alcohol use. She reports that she does not use drugs.  ROS: All other review of systems were reviewed and are negative except what is noted above in HPI  Physical Exam: BP (!) 179/96   Pulse 60   Temp 98.4 F (36.9 C)   Constitutional:  Alert and oriented, No acute distress. HEENT: [place] AT, moist mucus membranes.  Trachea midline, no masses. Cardiovascular: No clubbing, cyanosis, or edema. Respiratory: Normal respiratory effort, no increased work of breathing. GI: Abdomen is soft, nontender, nondistended, no abdominal masses GU: No CVA tenderness.  Lymph: No cervical or inguinal lymphadenopathy. Skin: No rashes, bruises or suspicious lesions. Neurologic: Grossly intact, no focal deficits, moving all 4 extremities. Psychiatric: Normal mood and affect.  Laboratory Data: Lab Results  Component Value Date   WBC 6.4 08/31/2020   HGB 12.3 08/31/2020   HCT 40.5 08/31/2020   MCV 98.1 08/31/2020   PLT 362 08/31/2020    Lab Results  Component Value Date   CREATININE 0.73 08/31/2020    No results found for: PSA  No results found for: TESTOSTERONE  No results found for: HGBA1C  Urinalysis    Component Value Date/Time   APPEARANCEUR Hazy (A) 08/24/2020 0937   GLUCOSEU Negative 08/24/2020 0937   BILIRUBINUR Negative 08/24/2020 0937   PROTEINUR Negative 08/24/2020 0937   NITRITE Positive (A) 08/24/2020 0937   LEUKOCYTESUR 1+ (A) 08/24/2020 0937    Lab Results  Component Value Date   LABMICR See below: 08/24/2020   WBCUA 11-30 (A) 08/24/2020   LABEPIT 0-10 08/24/2020   BACTERIA Moderate (A)  08/24/2020    Pertinent Imaging:  No results found for this or any previous visit.  No results found for this or any previous visit.  No results found for this or any previous visit.  No results found for this or any previous visit.  No results found for this or any previous visit.  No results found for this or any previous visit.  No results found for this or any previous visit.  No results found for this or any previous visit.  Assessment & Plan:    1. Chronic cystitis with hematuria Macrobid '50mg'$  qhs - Urinalysis, Routine w reflex microscopic - BLADDER SCAN AMB NON-IMAGING - Urine Culture  2. Overactive bladder We will trial sanctura '60mg'$  daily. If this does not improve her LUTS we will proceed with gemtesa   Return in about 3 months (around 12/29/2020).  Nicolette Bang, MD  [facility] [place]

## 2020-10-01 LAB — URINE CULTURE

## 2020-10-02 ENCOUNTER — Other Ambulatory Visit: Payer: Self-pay

## 2020-10-02 DIAGNOSIS — N3021 Other chronic cystitis with hematuria: Secondary | ICD-10-CM

## 2020-10-02 MED ORDER — NITROFURANTOIN MONOHYD MACRO 100 MG PO CAPS
100.0000 mg | ORAL_CAPSULE | Freq: Two times a day (BID) | ORAL | 0 refills | Status: DC
Start: 2020-10-02 — End: 2020-11-29

## 2020-10-02 NOTE — Progress Notes (Signed)
Patient will drop off urine specimen when abt is completed to ensure infection is gone.

## 2020-10-11 ENCOUNTER — Encounter (HOSPITAL_COMMUNITY)
Admission: RE | Admit: 2020-10-11 | Discharge: 2020-10-11 | Disposition: A | Discharge status: Home / Self Care | Payer: Medicare Other | Source: Ambulatory Visit | Attending: Internal Medicine | Admitting: Internal Medicine

## 2020-10-11 ENCOUNTER — Other Ambulatory Visit: Payer: Self-pay

## 2020-10-11 ENCOUNTER — Ambulatory Visit (HOSPITAL_COMMUNITY)
Admission: RE | Admit: 2020-10-11 | Discharge: 2020-10-11 | Disposition: A | Discharge status: Home / Self Care | Payer: Medicare Other | Source: Ambulatory Visit | Attending: Internal Medicine | Admitting: Internal Medicine

## 2020-10-11 DIAGNOSIS — R0600 Dyspnea, unspecified: Secondary | ICD-10-CM | POA: Diagnosis not present

## 2020-10-11 LAB — NM MYOCAR MULTI W/SPECT W/WALL MOTION / EF
LV dias vol: 69 mL (ref 46–106)
LV sys vol: 22 mL
Peak HR: 83 {beats}/min
RATE: 0.36
Rest HR: 60 {beats}/min
SDS: 0
SRS: 0
SSS: 0
TID: 1.04

## 2020-10-11 LAB — GLUCOSE, CAPILLARY: Glucose-Capillary: 94 mg/dL (ref 70–99)

## 2020-10-11 MED ORDER — SODIUM CHLORIDE FLUSH 0.9 % IV SOLN
INTRAVENOUS | Status: AC
  Administered 2020-10-11: 10 mL via INTRAVENOUS
  Filled 2020-10-11: qty 10

## 2020-10-11 MED ORDER — REGADENOSON 0.4 MG/5ML IV SOLN
INTRAVENOUS | Status: AC
  Administered 2020-10-11: 0.4 mg via INTRAVENOUS
  Filled 2020-10-11: qty 5

## 2020-10-11 MED ORDER — TECHNETIUM TC 99M TETROFOSMIN IV KIT
30.0000 | PACK | Freq: Once | INTRAVENOUS | Status: AC | PRN
  Administered 2020-10-11: 28.5 via INTRAVENOUS

## 2020-10-11 MED ORDER — TECHNETIUM TC 99M TETROFOSMIN IV KIT
10.0000 | PACK | Freq: Once | INTRAVENOUS | Status: AC | PRN
  Administered 2020-10-11: 10.2 via INTRAVENOUS

## 2020-10-15 ENCOUNTER — Telehealth: Payer: Self-pay

## 2020-10-15 NOTE — Telephone Encounter (Signed)
Pt daughter and pt notified and verbalized understanding.

## 2020-10-15 NOTE — Telephone Encounter (Signed)
-----   Message from Fay Records, MD sent at 10/12/2020  5:56 PM EDT ----- Stress test is normal   No evidence of ischemia or scar to explain symptoms

## 2020-11-20 ENCOUNTER — Inpatient Hospital Stay (HOSPITAL_COMMUNITY)
Admission: EM | Admit: 2020-11-20 | Discharge: 2020-11-29 | DRG: 205 | Disposition: A | Discharge status: Skilled Nursing Facility | Payer: Medicare Other | Attending: Internal Medicine | Admitting: Internal Medicine

## 2020-11-20 ENCOUNTER — Other Ambulatory Visit: Payer: Self-pay

## 2020-11-20 ENCOUNTER — Emergency Department (HOSPITAL_COMMUNITY): Payer: Medicare Other

## 2020-11-20 ENCOUNTER — Encounter (HOSPITAL_COMMUNITY): Payer: Self-pay

## 2020-11-20 DIAGNOSIS — K224 Dyskinesia of esophagus: Secondary | ICD-10-CM | POA: Diagnosis present

## 2020-11-20 DIAGNOSIS — R0902 Hypoxemia: Secondary | ICD-10-CM

## 2020-11-20 DIAGNOSIS — Z833 Family history of diabetes mellitus: Secondary | ICD-10-CM

## 2020-11-20 DIAGNOSIS — K2289 Other specified disease of esophagus: Secondary | ICD-10-CM | POA: Diagnosis present

## 2020-11-20 DIAGNOSIS — R0602 Shortness of breath: Secondary | ICD-10-CM

## 2020-11-20 DIAGNOSIS — T378X5A Adverse effect of other specified systemic anti-infectives and antiparasitics, initial encounter: Secondary | ICD-10-CM | POA: Diagnosis present

## 2020-11-20 DIAGNOSIS — E785 Hyperlipidemia, unspecified: Secondary | ICD-10-CM | POA: Diagnosis not present

## 2020-11-20 DIAGNOSIS — N3281 Overactive bladder: Secondary | ICD-10-CM | POA: Diagnosis not present

## 2020-11-20 DIAGNOSIS — G4700 Insomnia, unspecified: Secondary | ICD-10-CM | POA: Diagnosis present

## 2020-11-20 DIAGNOSIS — R933 Abnormal findings on diagnostic imaging of other parts of digestive tract: Secondary | ICD-10-CM

## 2020-11-20 DIAGNOSIS — M810 Age-related osteoporosis without current pathological fracture: Secondary | ICD-10-CM | POA: Diagnosis not present

## 2020-11-20 DIAGNOSIS — I5032 Chronic diastolic (congestive) heart failure: Secondary | ICD-10-CM | POA: Diagnosis present

## 2020-11-20 DIAGNOSIS — J704 Drug-induced interstitial lung disorders, unspecified: Secondary | ICD-10-CM | POA: Diagnosis not present

## 2020-11-20 DIAGNOSIS — R5381 Other malaise: Secondary | ICD-10-CM | POA: Diagnosis not present

## 2020-11-20 DIAGNOSIS — J9600 Acute respiratory failure, unspecified whether with hypoxia or hypercapnia: Secondary | ICD-10-CM | POA: Diagnosis present

## 2020-11-20 DIAGNOSIS — R54 Age-related physical debility: Secondary | ICD-10-CM | POA: Diagnosis present

## 2020-11-20 DIAGNOSIS — K219 Gastro-esophageal reflux disease without esophagitis: Secondary | ICD-10-CM | POA: Diagnosis present

## 2020-11-20 DIAGNOSIS — J984 Other disorders of lung: Secondary | ICD-10-CM | POA: Diagnosis not present

## 2020-11-20 DIAGNOSIS — J9601 Acute respiratory failure with hypoxia: Secondary | ICD-10-CM | POA: Diagnosis not present

## 2020-11-20 DIAGNOSIS — R279 Unspecified lack of coordination: Secondary | ICD-10-CM | POA: Diagnosis not present

## 2020-11-20 DIAGNOSIS — R1319 Other dysphagia: Secondary | ICD-10-CM

## 2020-11-20 DIAGNOSIS — K222 Esophageal obstruction: Secondary | ICD-10-CM | POA: Diagnosis present

## 2020-11-20 DIAGNOSIS — Z743 Need for continuous supervision: Secondary | ICD-10-CM | POA: Diagnosis not present

## 2020-11-20 DIAGNOSIS — Z86711 Personal history of pulmonary embolism: Secondary | ICD-10-CM

## 2020-11-20 DIAGNOSIS — J310 Chronic rhinitis: Secondary | ICD-10-CM | POA: Diagnosis present

## 2020-11-20 DIAGNOSIS — D500 Iron deficiency anemia secondary to blood loss (chronic): Secondary | ICD-10-CM | POA: Diagnosis present

## 2020-11-20 DIAGNOSIS — R1314 Dysphagia, pharyngoesophageal phase: Secondary | ICD-10-CM | POA: Diagnosis not present

## 2020-11-20 DIAGNOSIS — I482 Chronic atrial fibrillation, unspecified: Secondary | ICD-10-CM | POA: Diagnosis present

## 2020-11-20 DIAGNOSIS — I700 Atherosclerosis of aorta: Secondary | ICD-10-CM | POA: Diagnosis not present

## 2020-11-20 DIAGNOSIS — K449 Diaphragmatic hernia without obstruction or gangrene: Secondary | ICD-10-CM | POA: Diagnosis present

## 2020-11-20 DIAGNOSIS — D649 Anemia, unspecified: Secondary | ICD-10-CM | POA: Diagnosis not present

## 2020-11-20 DIAGNOSIS — Z7401 Bed confinement status: Secondary | ICD-10-CM | POA: Diagnosis not present

## 2020-11-20 DIAGNOSIS — J189 Pneumonia, unspecified organism: Secondary | ICD-10-CM | POA: Diagnosis not present

## 2020-11-20 DIAGNOSIS — Z6835 Body mass index (BMI) 35.0-35.9, adult: Secondary | ICD-10-CM | POA: Diagnosis not present

## 2020-11-20 DIAGNOSIS — J209 Acute bronchitis, unspecified: Secondary | ICD-10-CM | POA: Diagnosis not present

## 2020-11-20 DIAGNOSIS — Z7722 Contact with and (suspected) exposure to environmental tobacco smoke (acute) (chronic): Secondary | ICD-10-CM | POA: Diagnosis present

## 2020-11-20 DIAGNOSIS — J40 Bronchitis, not specified as acute or chronic: Secondary | ICD-10-CM

## 2020-11-20 DIAGNOSIS — R2689 Other abnormalities of gait and mobility: Secondary | ICD-10-CM | POA: Diagnosis not present

## 2020-11-20 DIAGNOSIS — Z888 Allergy status to other drugs, medicaments and biological substances status: Secondary | ICD-10-CM

## 2020-11-20 DIAGNOSIS — I080 Rheumatic disorders of both mitral and aortic valves: Secondary | ICD-10-CM | POA: Diagnosis present

## 2020-11-20 DIAGNOSIS — Z9049 Acquired absence of other specified parts of digestive tract: Secondary | ICD-10-CM

## 2020-11-20 DIAGNOSIS — Z8701 Personal history of pneumonia (recurrent): Secondary | ICD-10-CM

## 2020-11-20 DIAGNOSIS — E669 Obesity, unspecified: Secondary | ICD-10-CM | POA: Diagnosis present

## 2020-11-20 DIAGNOSIS — Z20822 Contact with and (suspected) exposure to covid-19: Secondary | ICD-10-CM | POA: Diagnosis present

## 2020-11-20 DIAGNOSIS — R1310 Dysphagia, unspecified: Secondary | ICD-10-CM | POA: Diagnosis not present

## 2020-11-20 DIAGNOSIS — Z902 Acquired absence of lung [part of]: Secondary | ICD-10-CM

## 2020-11-20 DIAGNOSIS — M6281 Muscle weakness (generalized): Secondary | ICD-10-CM | POA: Diagnosis not present

## 2020-11-20 DIAGNOSIS — Z820 Family history of epilepsy and other diseases of the nervous system: Secondary | ICD-10-CM

## 2020-11-20 DIAGNOSIS — E559 Vitamin D deficiency, unspecified: Secondary | ICD-10-CM | POA: Diagnosis present

## 2020-11-20 DIAGNOSIS — Z8249 Family history of ischemic heart disease and other diseases of the circulatory system: Secondary | ICD-10-CM

## 2020-11-20 DIAGNOSIS — Z8673 Personal history of transient ischemic attack (TIA), and cerebral infarction without residual deficits: Secondary | ICD-10-CM

## 2020-11-20 DIAGNOSIS — Z96652 Presence of left artificial knee joint: Secondary | ICD-10-CM | POA: Diagnosis present

## 2020-11-20 DIAGNOSIS — T50905D Adverse effect of unspecified drugs, medicaments and biological substances, subsequent encounter: Secondary | ICD-10-CM | POA: Diagnosis not present

## 2020-11-20 DIAGNOSIS — R195 Other fecal abnormalities: Secondary | ICD-10-CM | POA: Diagnosis present

## 2020-11-20 DIAGNOSIS — I110 Hypertensive heart disease with heart failure: Secondary | ICD-10-CM | POA: Diagnosis present

## 2020-11-20 DIAGNOSIS — Z634 Disappearance and death of family member: Secondary | ICD-10-CM

## 2020-11-20 DIAGNOSIS — G250 Essential tremor: Secondary | ICD-10-CM | POA: Diagnosis not present

## 2020-11-20 DIAGNOSIS — N3021 Other chronic cystitis with hematuria: Secondary | ICD-10-CM

## 2020-11-20 DIAGNOSIS — T50905A Adverse effect of unspecified drugs, medicaments and biological substances, initial encounter: Secondary | ICD-10-CM

## 2020-11-20 DIAGNOSIS — Z882 Allergy status to sulfonamides status: Secondary | ICD-10-CM

## 2020-11-20 DIAGNOSIS — Z79899 Other long term (current) drug therapy: Secondary | ICD-10-CM

## 2020-11-20 DIAGNOSIS — I10 Essential (primary) hypertension: Secondary | ICD-10-CM | POA: Diagnosis not present

## 2020-11-20 DIAGNOSIS — Z87440 Personal history of urinary (tract) infections: Secondary | ICD-10-CM

## 2020-11-20 DIAGNOSIS — R739 Hyperglycemia, unspecified: Secondary | ICD-10-CM | POA: Diagnosis present

## 2020-11-20 DIAGNOSIS — Z881 Allergy status to other antibiotic agents status: Secondary | ICD-10-CM

## 2020-11-20 DIAGNOSIS — I350 Nonrheumatic aortic (valve) stenosis: Secondary | ICD-10-CM | POA: Diagnosis not present

## 2020-11-20 DIAGNOSIS — I639 Cerebral infarction, unspecified: Secondary | ICD-10-CM | POA: Diagnosis present

## 2020-11-20 DIAGNOSIS — I4891 Unspecified atrial fibrillation: Secondary | ICD-10-CM | POA: Diagnosis not present

## 2020-11-20 DIAGNOSIS — K2270 Barrett's esophagus without dysplasia: Secondary | ICD-10-CM | POA: Diagnosis not present

## 2020-11-20 DIAGNOSIS — Z7901 Long term (current) use of anticoagulants: Secondary | ICD-10-CM

## 2020-11-20 LAB — CBC WITH DIFFERENTIAL/PLATELET
Abs Immature Granulocytes: 0.03 10*3/uL (ref 0.00–0.07)
Basophils Absolute: 0.1 10*3/uL (ref 0.0–0.1)
Basophils Relative: 1 %
Eosinophils Absolute: 0.6 10*3/uL — ABNORMAL HIGH (ref 0.0–0.5)
Eosinophils Relative: 7 %
HCT: 32.2 % — ABNORMAL LOW (ref 36.0–46.0)
Hemoglobin: 9.7 g/dL — ABNORMAL LOW (ref 12.0–15.0)
Immature Granulocytes: 0 %
Lymphocytes Relative: 25 %
Lymphs Abs: 2.1 10*3/uL (ref 0.7–4.0)
MCH: 28 pg (ref 26.0–34.0)
MCHC: 30.1 g/dL (ref 30.0–36.0)
MCV: 92.8 fL (ref 80.0–100.0)
Monocytes Absolute: 0.8 10*3/uL (ref 0.1–1.0)
Monocytes Relative: 9 %
Neutro Abs: 4.9 10*3/uL (ref 1.7–7.7)
Neutrophils Relative %: 58 %
Platelets: 341 10*3/uL (ref 150–400)
RBC: 3.47 MIL/uL — ABNORMAL LOW (ref 3.87–5.11)
RDW: 14.8 % (ref 11.5–15.5)
WBC: 8.4 10*3/uL (ref 4.0–10.5)
nRBC: 0 % (ref 0.0–0.2)

## 2020-11-20 LAB — BASIC METABOLIC PANEL
Anion gap: 7 (ref 5–15)
BUN: 20 mg/dL (ref 8–23)
CO2: 22 mmol/L (ref 22–32)
Calcium: 8.4 mg/dL — ABNORMAL LOW (ref 8.9–10.3)
Chloride: 109 mmol/L (ref 98–111)
Creatinine, Ser: 0.79 mg/dL (ref 0.44–1.00)
GFR, Estimated: >60 mL/min (ref >60)
Glucose, Bld: 132 mg/dL — ABNORMAL HIGH (ref 70–99)
Potassium: 3.7 mmol/L (ref 3.5–5.1)
Sodium: 138 mmol/L (ref 135–145)

## 2020-11-20 LAB — RESP PANEL BY RT-PCR (FLU A&B, COVID) ARPGX2
Influenza A by PCR: NEGATIVE
Influenza B by PCR: NEGATIVE
SARS Coronavirus 2 by RT PCR: NEGATIVE

## 2020-11-20 IMAGING — DX DG CHEST 2V
2 series · 2 of 2 positions shown · non-contrast
Comparison: [DATE]

CLINICAL DATA: Worsening shortness of breath.

EXAM:
CHEST - 2 VIEW

[chest lat]
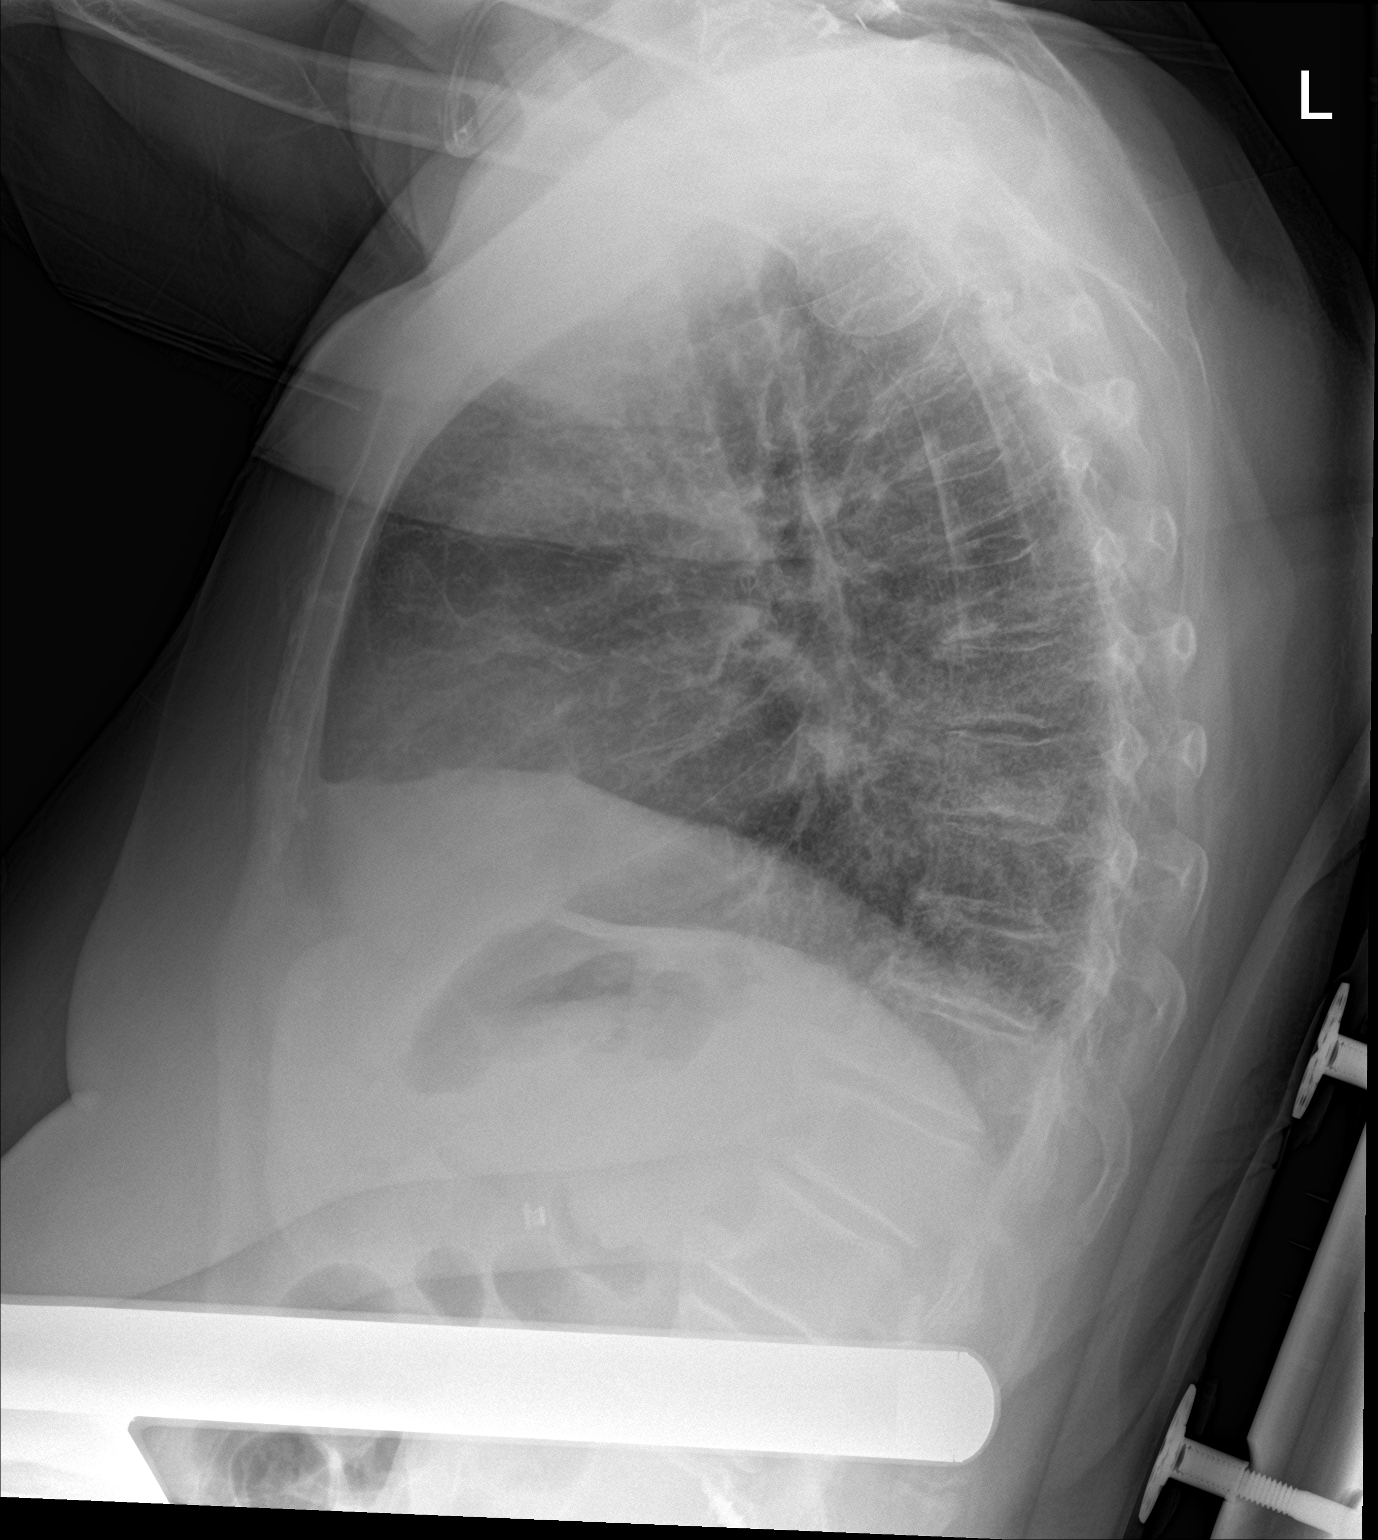

[chest ap]
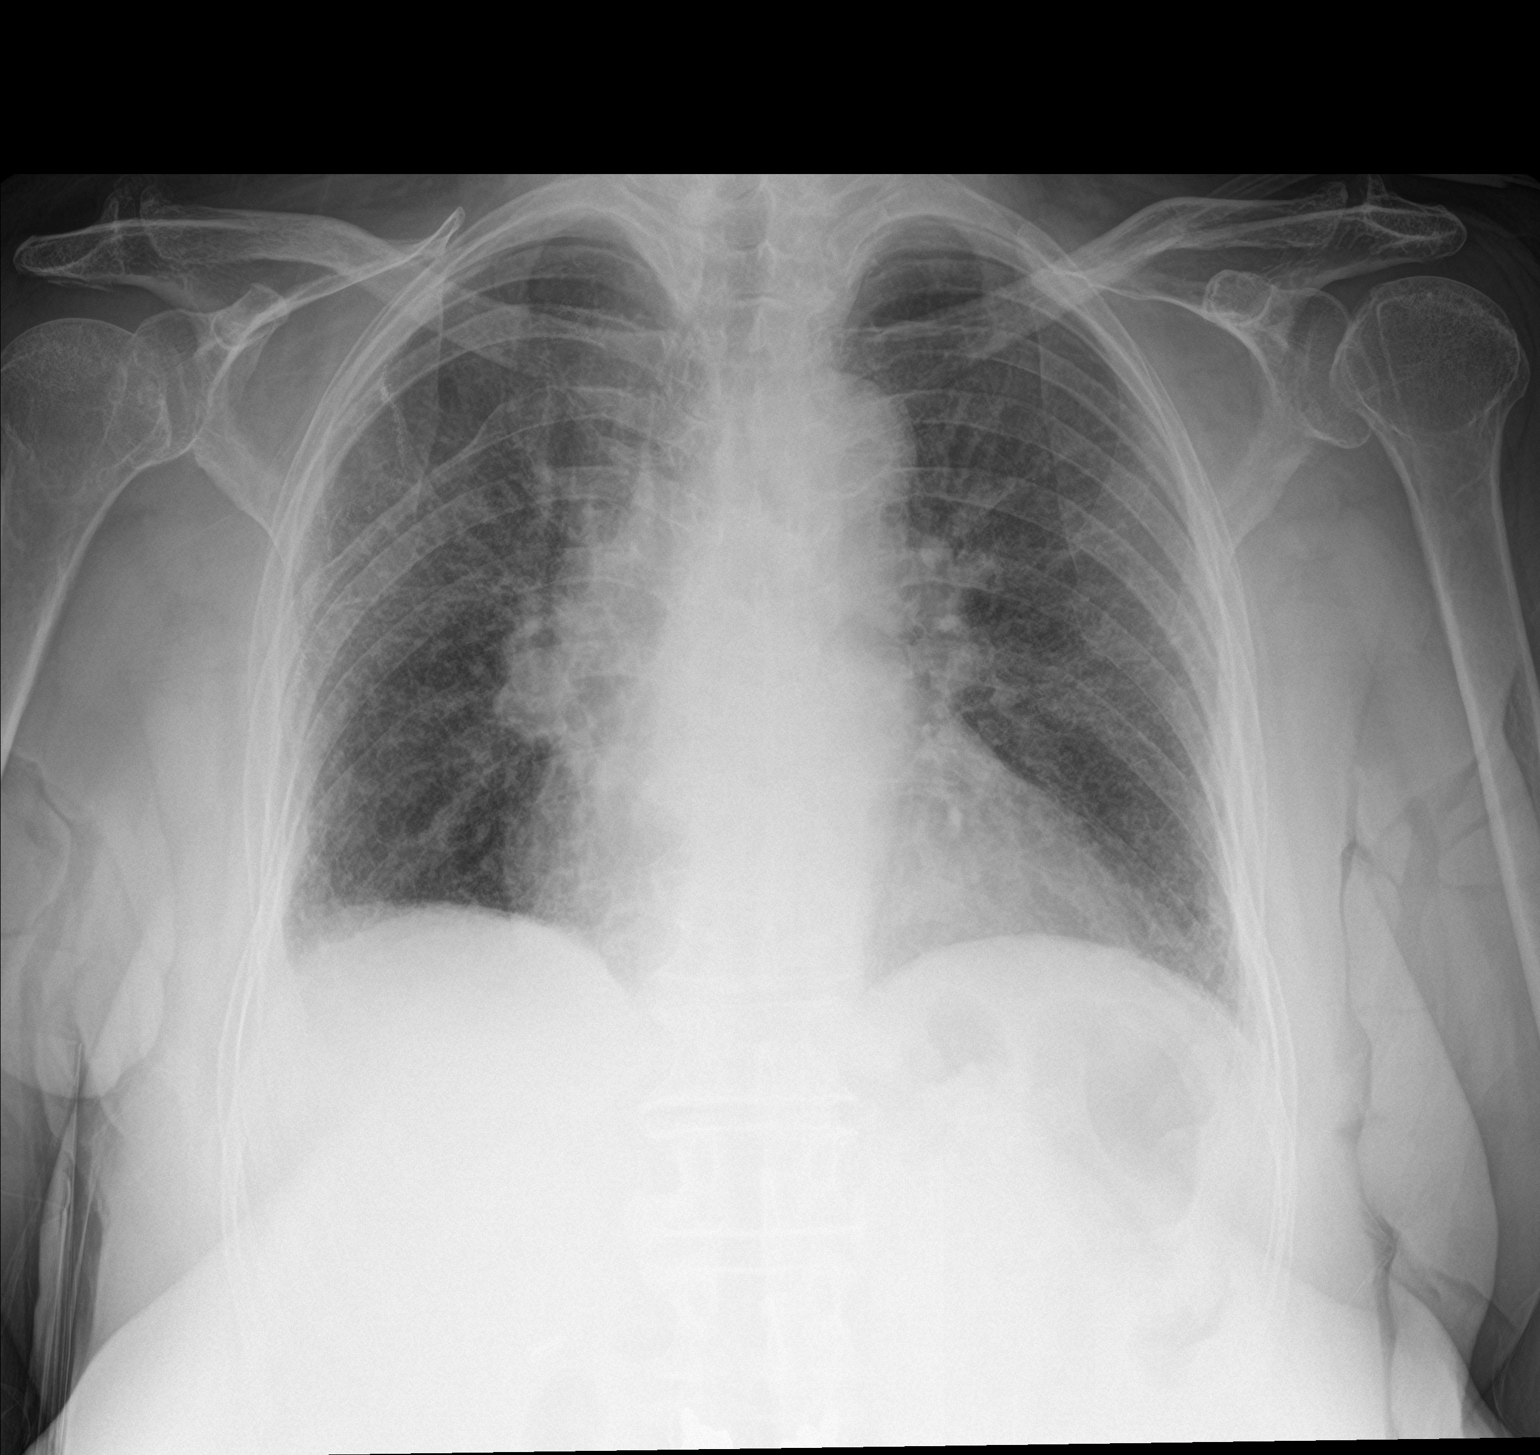

[2 of 2 positions shown; findings below may reference images not displayed]

FINDINGS: Stable, mild to moderate severity diffuse chronic appearing
increased interstitial lung markings are seen. There is no evidence
of a pleural effusion or pneumothorax. The heart size and
mediastinal contours are within normal limits. There is mild
calcification of the aortic arch. A chronic sixth right rib
deformity is seen. Degenerative changes are noted throughout the
thoracic spine.
IMPRESSION: Chronic interstitial lung disease without evidence of acute or
active cardiopulmonary disease.

## 2020-11-20 MED ORDER — ALBUTEROL SULFATE HFA 108 (90 BASE) MCG/ACT IN AERS: 2.0000 | INHALATION_SPRAY | RESPIRATORY_TRACT | Status: DC | PRN

## 2020-11-20 MED ORDER — ALBUTEROL SULFATE (2.5 MG/3ML) 0.083% IN NEBU
5.0000 mg | INHALATION_SOLUTION | Freq: Once | RESPIRATORY_TRACT | Status: AC
  Administered 2020-11-21: 5 mg via RESPIRATORY_TRACT
  Filled 2020-11-20: qty 6

## 2020-11-20 MED ORDER — SODIUM CHLORIDE 0.9 % IV SOLN
INTRAVENOUS | Status: DC
Start: 2020-11-20 — End: 2020-11-22

## 2020-11-20 MED ORDER — METHYLPREDNISOLONE SODIUM SUCC 125 MG IJ SOLR
125.0000 mg | Freq: Once | INTRAMUSCULAR | Status: AC
  Administered 2020-11-20: 125 mg via INTRAVENOUS
  Filled 2020-11-20: qty 2

## 2020-11-20 MED ORDER — SODIUM CHLORIDE 0.9 % IV SOLN
1.0000 g | Freq: Once | INTRAVENOUS | Status: AC
  Administered 2020-11-20: 1 g via INTRAVENOUS
  Filled 2020-11-20: qty 10

## 2020-11-20 NOTE — ED Notes (Signed)
EDP in room to assess 

## 2020-11-20 NOTE — ED Triage Notes (Signed)
Pt presents to ED with SOB, it got bad on Sunday and has progressively gotten worse. Pt has had intermittent headache since Saturday. Pt had Echo in July and stress test was done in august. Pt has swelling to bilateral LE. Crackles heard through lungs bilaterally. Pt O2 sat in triage was 85% on room air, placed on 2 L oxygen and sats came up 98% now. No rooms available at this time- will place standing orders and put pt in V1 while a/w room. Pt daughter with pt.

## 2020-11-20 NOTE — ED Provider Notes (Addendum)
[facility] EMERGENCY DEPARTMENT Provider Note   CSN: [id number] Arrival date & time: 11/20/20  1959     History Chief Complaint  Patient presents with   Shortness of Breath    Monique Robles is a 80 y.o. female.  HPI She presents for worsening trouble breathing associated with hypoxia, from home.  At home today her oxygen saturation read 75% on room air, using a home monitor.  She has been using her albuterol nebulizer frequently for the last couple of weeks, previously was using it sporadically.  She is currently coughing and producing some sputum.  She does not have a fever, chest pain, focal weakness or paresthesia.  She has been followed closely by cardiology over the last several months because of ongoing shortness of breath with dyspnea on exertion.  She has been cleared by them, and referred back to PCP for referral to pulmonary.  She is here with her daughter who gives most of the history.    Past Medical History:  Diagnosis Date   Aortic insufficiency    Atrial fibrillation (HCC)    Diastolic dysfunction    DOE (dyspnea on exertion)    GERD (gastroesophageal reflux disease)    History of pneumonia    History of pulmonary embolism    History of stroke    HTN (hypertension)    Hyperlipemia    Mitral valve regurgitation    Osteoporosis    Tremor    Vitamin D deficiency     Patient Active Problem List   Diagnosis Date Noted   Acute respiratory failure with hypoxia ([place]) 11/21/2020   Overactive bladder 08/24/2020   Acute cystitis without hematuria 08/24/2020   Chronic cystitis with hematuria 08/24/2020   Essential tremor 08/15/2019   Cerebrovascular accident (CVA) ([place]) 08/15/2019   Gait abnormality 08/15/2019    Past Surgical History:  Procedure Laterality Date   BLADDER SURGERY     x 3   CERVICAL SPINE SURGERY     CHOLECYSTECTOMY     COLONOSCOPY     COLONOSCOPY WITH ESOPHAGOGASTRODUODENOSCOPY (EGD)     LUMBAR SPINE SURGERY     REPLACEMENT TOTAL KNEE  Left    RIGHT LUNG WEDGE REMOVAL     TONSILLECTOMY       OB History   No obstetric history on file.     Family History  Problem Relation Age of Onset   Alzheimer's disease Mother        died at 4   Cancer Mother    Tremor Mother    Pneumonia Father    Heart disease Father    Diabetes Father    Heart attack Father        died at 67    Social History   Tobacco Use   Smoking status: Never   Smokeless tobacco: Never  Vaping Use   Vaping Use: Never used  Substance Use Topics   Alcohol use: Not Currently   Drug use: Never    Home Medications Prior to Admission medications   Medication Sig Start Date End Date Taking? Authorizing Provider  Acetaminophen (TYLENOL ARTHRITIS PAIN PO) Take 650 mg by mouth daily as needed.     [provider]  albuterol (PROVENTIL) (2.5 MG/3ML) 0.083% nebulizer solution Take 2.5 mg by nebulization every 4 (four) hours as needed. 06/21/20   [provider]  albuterol (VENTOLIN HFA) 108 (90 Base) MCG/ACT inhaler SMARTSIG:2 Puff(s) By Mouth Every 4 Hours PRN 06/21/20   [provider]  apixaban Arne Cleveland)  5 MG TABS tablet Take 5 mg by mouth 2 (two) times daily.    [provider]  calcium carbonate (TUMS - DOSED IN MG ELEMENTAL CALCIUM) 500 MG chewable tablet Chew 1 tablet by mouth daily as needed for indigestion or heartburn.    [provider]  diphenhydrAMINE HCl (BENADRYL PO) Take 1 tablet by mouth as needed (seasonal allergies).    [provider]  furosemide (LASIX) 20 MG tablet Take 20 mg by mouth daily. 07/31/20   [provider]  gabapentin (NEURONTIN) 600 MG tablet Take 600 mg by mouth 3 (three) times daily.    [provider]  HYDROcodone-acetaminophen (NORCO/VICODIN) 5-325 MG tablet Take 1 tablet by mouth 2 (two) times daily as needed. 07/31/20   [provider]  Melatonin 10 MG TABS Take 10 mg by mouth at bedtime.    [provider]  Multiple Vitamin  (MULTIVITAMIN) tablet Take 1 tablet by mouth daily.    [provider]  nitrofurantoin (MACRODANTIN) 50 MG capsule Take 1 capsule (50 mg total) by mouth at bedtime. 09/28/20   McKenzie, Candee Furbish, MD  nitrofurantoin, macrocrystal-monohydrate, (MACROBID) 100 MG capsule Take 1 capsule (100 mg total) by mouth every 12 (twelve) hours. 08/24/20   McKenzie, Candee Furbish, MD  nitrofurantoin, macrocrystal-monohydrate, (MACROBID) 100 MG capsule Take 1 capsule (100 mg total) by mouth 2 (two) times daily. Stop taking low dose Macrodantin until this course is completed then restart. 10/02/20   McKenzie, Candee Furbish, MD  Nystatin POWD 2 application by Does not apply route as needed.    [provider]  omeprazole (PRILOSEC) 40 MG capsule Take 1 capsule by mouth daily. 06/21/20   [provider]  Potassium Chloride ER 20 MEQ TBCR Take 1 tablet by mouth daily. 07/31/20   [provider]  Probiotic Product (PROBIOTIC PO) Take 1 tablet by mouth daily.    [provider]  propranolol (INDERAL) 40 MG tablet Take 1 tablet (40 mg total) by mouth 2 (two) times daily. 08/15/19   Marcial Pacas, MD  Trospium Chloride 60 MG CP24 Take 1 capsule (60 mg total) by mouth daily. 09/28/20   McKenzie, Candee Furbish, MD  Vibegron (GEMTESA) 75 MG TABS Take 1 capsule by mouth daily. 09/20/20   McKenzie, Candee Furbish, MD  VITAMIN D PO Take 1,000 Units by mouth daily.    [provider]    Allergies    Coumadin [warfarin], Atorvastatin, Ciprofloxacin, and Sulfa antibiotics  Review of Systems   Review of Systems  All other systems reviewed and are negative.  Physical Exam Updated Vital Signs BP (!) 151/77   Pulse 70   Temp 98.1 F (36.7 C) (Oral)   Resp (!) 29   Ht '5\' 1"'$  (1.549 m)   Wt 83.9 kg   SpO2 94%   BMI 34.96 kg/m   Physical Exam Vitals and nursing note reviewed.  Constitutional:      Appearance: She is well-developed.  HENT:     Head: Normocephalic and atraumatic.  Eyes:      Conjunctiva/sclera: Conjunctivae normal.     Pupils: Pupils are equal, round, and reactive to light.  Neck:     Trachea: Phonation normal.  Cardiovascular:     Rate and Rhythm: Normal rate and regular rhythm.  Pulmonary:     Effort: Pulmonary effort is normal.     Breath sounds: Rhonchi present.     Comments: Tachypneic with mild increased work of breathing. Chest:     Chest  wall: No tenderness.  Abdominal:     General: There is no distension.     Palpations: Abdomen is soft.     Tenderness: There is no abdominal tenderness. There is no guarding.  Musculoskeletal:        General: Normal range of motion.     Cervical back: Normal range of motion and neck supple.  Skin:    General: Skin is warm and dry.  Neurological:     Mental Status: She is alert and oriented to person, place, and time.     Motor: No abnormal muscle tone.  Psychiatric:        Mood and Affect: Mood normal.        Behavior: Behavior normal.        Thought Content: Thought content normal.        Judgment: Judgment normal.    ED Results / Procedures / Treatments   Labs (all labs ordered are listed, but only abnormal results are displayed) Labs Reviewed  BASIC METABOLIC PANEL - Abnormal; Notable for the following components:      Result Value   Glucose, Bld 132 (*)    Calcium 8.4 (*)    All other components within normal limits  CBC WITH DIFFERENTIAL/PLATELET - Abnormal; Notable for the following components:   RBC 3.47 (*)    Hemoglobin 9.7 (*)    HCT 32.2 (*)    Eosinophils Absolute 0.6 (*)    All other components within normal limits  RESP PANEL BY RT-PCR (FLU A&B, COVID) [id number]    EKG EKG Interpretation  Date/Time:  Tuesday November 20 2020 21:59:20 EDT Ventricular Rate:  73 PR Interval:  162 QRS Duration: 88 QT Interval:  405 QTC Calculation: 447 R Axis:   79 Text Interpretation: Sinus rhythm No old tracing to compare Confirmed by Daleen Bo [phone]) on 11/21/2020 12:57:51  AM  Radiology DG Chest 2 View  Result Date: 11/20/2020 CLINICAL DATA:  Worsening shortness of breath. EXAM: CHEST - 2 VIEW COMPARISON:  Jul 31, 2020 FINDINGS: Stable, mild to moderate severity diffuse chronic appearing increased interstitial lung markings are seen. There is no evidence of a pleural effusion or pneumothorax. The heart size and mediastinal contours are within normal limits. There is mild calcification of the aortic arch. A chronic sixth right rib deformity is seen. Degenerative changes are noted throughout the thoracic spine. IMPRESSION: Chronic interstitial lung disease without evidence of acute or active cardiopulmonary disease. Electronically Signed   By: Virgina Norfolk M.D.   On: 11/20/2020 21:09    Procedures .Critical Care Performed by: Daleen Bo, MD Authorized by: Daleen Bo, MD   Critical care provider statement:    Critical care time (minutes):  35   Critical care start time:  11/20/2020 9:50 PM   Critical care end time:  11/21/2020 1:16 AM   Critical care time was exclusive of:  Separately billable procedures and treating other patients   Critical care was necessary to treat or prevent imminent or life-threatening deterioration of the following conditions:  Respiratory failure   Critical care was time spent personally by me on the following activities:  Blood draw for specimens, development of treatment plan with patient or surrogate, discussions with consultants, evaluation of patient's response to treatment, examination of patient, obtaining history from patient or surrogate, ordering and performing treatments and interventions, ordering and review of laboratory studies, pulse oximetry, re-evaluation of patient's condition, review of old charts and ordering and review of radiographic studies   Medications Ordered  in ED Medications  albuterol (VENTOLIN HFA) 108 (90 Base) MCG/ACT inhaler 2 puff (has no administration in time range)  0.9 %  sodium chloride  infusion ( Intravenous New Bag/Given 11/20/20 2246)  methylPREDNISolone sodium succinate (SOLU-MEDROL) 125 mg/2 mL injection 125 mg (125 mg Intravenous Given 11/20/20 2245)  albuterol (PROVENTIL) (2.5 MG/3ML) 0.083% nebulizer solution 5 mg (5 mg Nebulization Given 11/21/20 0025)  cefTRIAXone (ROCEPHIN) 1 g in sodium chloride 0.9 % 100 mL IVPB (0 g Intravenous Stopped 11/21/20 0107)    ED Course  I have reviewed the triage vital signs and the nursing notes.  Pertinent labs & imaging results that were available during my care of the patient were reviewed by me and considered in my medical decision making (see chart for details).    MDM Rules/Calculators/A&P                            Patient Vitals for the past 24 hrs:  BP Temp Temp src Pulse Resp SpO2 Height Weight  11/21/20 0025 -- -- -- -- -- 94 % -- --  11/21/20 0000 (!) 151/77 -- -- 70 (!) 29 92 % -- --  11/20/20 2330 (!) 145/81 -- -- 72 20 93 % -- --  11/20/20 2300 (!) 160/83 -- -- 71 (!) 21 96 % -- --  11/20/20 2230 (!) 165/79 -- -- 70 (!) 33 98 % -- --  11/20/20 2200 (!) 169/90 -- -- 74 (!) 28 99 % -- --  11/20/20 2130 (!) 147/57 -- -- 69 -- 100 % -- --  11/20/20 2030 -- -- -- -- -- -- '5\' 1"'$  (1.549 m) --  11/20/20 2029 -- -- -- -- -- (!) 85 % -- --  11/20/20 2023 -- -- -- -- -- 98 % -- --  11/20/20 2015 133/88 98.1 F (36.7 C) Oral 75 (!) 25 (!) 84 % -- 83.9 kg    12:37 AM Reevaluation with update and discussion. After initial assessment and treatment, an updated evaluation reveals vital signs stable.  No respiratory distress.  Patient and family updated on plan and findings. Daleen Bo   Medical Decision Making:  This patient is presenting for evaluation of shortness of breath, worsening over the last 2 weeks, which does require a range of treatment options, and is a complaint that involves a high risk of morbidity and mortality. The differential diagnoses include pulmonary disorder, cardiac disorder, chronic lung disease,  exacerbation of asthma/COPD. I decided to review old records, and in summary elderly female, being followed closely by her cardiologist, for ongoing dyspnea.  She has longstanding intermittent symptoms, that appear to be worsening significantly over the last 3 months.  She presents with hypoxia at home, she has been producing sputum with coughing.  She does not have a formal diagnosis for chronic lung disease.  She likely has an element of COPD.  I obtained additional historical information from daughter at bedside.  Clinical Laboratory Tests Ordered, included CBC, Metabolic panel, and viral panel . Review indicates normal except glucose high, calcium low, hemoglobin low. Radiologic Tests Ordered, included chest x-ray.  I independently Visualized: Radiograph images, which show no acute abnormality   Critical Interventions-clinical evaluation, laboratory testing, radiography, medication treatment, observation and reassessment  After These Interventions, the Patient was reevaluated and was found with hypoxia and likely exacerbation of chronic lung disease.  Patient being treated for acute bronchitis, with oxygen support for hypoxia.  No respiratory distress.  No  need for advanced airway.  CRITICAL CARE-yes Performed by: Daleen Bo  Nursing Notes Reviewed/ Care Coordinated Applicable Imaging Reviewed Interpretation of Laboratory Data incorporated into ED treatment   12:41 AM-Consult complete with hospitalist. Patient case explained and discussed.  He agrees to admit patient for further evaluation and treatment. Call ended at 1:10 AM    Final Clinical Impression(s) / ED Diagnoses Final diagnoses:  Hypoxia  Acute bronchitis, unspecified organism  Chronic lung disease    Rx / DC Orders ED Discharge Orders     None        Daleen Bo, MD 11/21/20 [id number]    Daleen Bo, MD 12/01/20 [phone]

## 2020-11-20 NOTE — ED Notes (Addendum)
While changing patient into gown, pt sat on edge of bed. Oxygen briefly removed, pt quickly desat to 75% from 96%. Pt back in bed, oxygen replaced. Saturations back up to 97%

## 2020-11-21 DIAGNOSIS — E669 Obesity, unspecified: Secondary | ICD-10-CM | POA: Diagnosis present

## 2020-11-21 DIAGNOSIS — K449 Diaphragmatic hernia without obstruction or gangrene: Secondary | ICD-10-CM | POA: Diagnosis present

## 2020-11-21 DIAGNOSIS — R1314 Dysphagia, pharyngoesophageal phase: Secondary | ICD-10-CM | POA: Diagnosis present

## 2020-11-21 DIAGNOSIS — E785 Hyperlipidemia, unspecified: Secondary | ICD-10-CM | POA: Diagnosis present

## 2020-11-21 DIAGNOSIS — R1319 Other dysphagia: Secondary | ICD-10-CM | POA: Diagnosis not present

## 2020-11-21 DIAGNOSIS — J40 Bronchitis, not specified as acute or chronic: Secondary | ICD-10-CM

## 2020-11-21 DIAGNOSIS — Z6835 Body mass index (BMI) 35.0-35.9, adult: Secondary | ICD-10-CM | POA: Diagnosis not present

## 2020-11-21 DIAGNOSIS — D500 Iron deficiency anemia secondary to blood loss (chronic): Secondary | ICD-10-CM | POA: Diagnosis present

## 2020-11-21 DIAGNOSIS — R1310 Dysphagia, unspecified: Secondary | ICD-10-CM | POA: Diagnosis not present

## 2020-11-21 DIAGNOSIS — Z634 Disappearance and death of family member: Secondary | ICD-10-CM | POA: Diagnosis not present

## 2020-11-21 DIAGNOSIS — J984 Other disorders of lung: Secondary | ICD-10-CM | POA: Diagnosis not present

## 2020-11-21 DIAGNOSIS — Z8673 Personal history of transient ischemic attack (TIA), and cerebral infarction without residual deficits: Secondary | ICD-10-CM | POA: Diagnosis not present

## 2020-11-21 DIAGNOSIS — J9600 Acute respiratory failure, unspecified whether with hypoxia or hypercapnia: Secondary | ICD-10-CM | POA: Diagnosis present

## 2020-11-21 DIAGNOSIS — J9601 Acute respiratory failure with hypoxia: Secondary | ICD-10-CM | POA: Diagnosis present

## 2020-11-21 DIAGNOSIS — Z20822 Contact with and (suspected) exposure to covid-19: Secondary | ICD-10-CM | POA: Diagnosis present

## 2020-11-21 DIAGNOSIS — R0902 Hypoxemia: Secondary | ICD-10-CM | POA: Diagnosis not present

## 2020-11-21 DIAGNOSIS — J189 Pneumonia, unspecified organism: Secondary | ICD-10-CM | POA: Diagnosis not present

## 2020-11-21 DIAGNOSIS — G250 Essential tremor: Secondary | ICD-10-CM | POA: Diagnosis present

## 2020-11-21 DIAGNOSIS — I110 Hypertensive heart disease with heart failure: Secondary | ICD-10-CM | POA: Diagnosis present

## 2020-11-21 DIAGNOSIS — R0602 Shortness of breath: Secondary | ICD-10-CM | POA: Diagnosis not present

## 2020-11-21 DIAGNOSIS — G4700 Insomnia, unspecified: Secondary | ICD-10-CM

## 2020-11-21 DIAGNOSIS — K224 Dyskinesia of esophagus: Secondary | ICD-10-CM | POA: Diagnosis present

## 2020-11-21 DIAGNOSIS — J310 Chronic rhinitis: Secondary | ICD-10-CM | POA: Diagnosis present

## 2020-11-21 DIAGNOSIS — I639 Cerebral infarction, unspecified: Secondary | ICD-10-CM

## 2020-11-21 DIAGNOSIS — I5032 Chronic diastolic (congestive) heart failure: Secondary | ICD-10-CM | POA: Diagnosis present

## 2020-11-21 DIAGNOSIS — K219 Gastro-esophageal reflux disease without esophagitis: Secondary | ICD-10-CM | POA: Diagnosis present

## 2020-11-21 DIAGNOSIS — J704 Drug-induced interstitial lung disorders, unspecified: Secondary | ICD-10-CM | POA: Diagnosis present

## 2020-11-21 DIAGNOSIS — D649 Anemia, unspecified: Secondary | ICD-10-CM | POA: Diagnosis not present

## 2020-11-21 DIAGNOSIS — R933 Abnormal findings on diagnostic imaging of other parts of digestive tract: Secondary | ICD-10-CM | POA: Diagnosis not present

## 2020-11-21 DIAGNOSIS — I080 Rheumatic disorders of both mitral and aortic valves: Secondary | ICD-10-CM | POA: Diagnosis present

## 2020-11-21 DIAGNOSIS — M810 Age-related osteoporosis without current pathological fracture: Secondary | ICD-10-CM | POA: Diagnosis present

## 2020-11-21 DIAGNOSIS — J209 Acute bronchitis, unspecified: Secondary | ICD-10-CM | POA: Diagnosis not present

## 2020-11-21 DIAGNOSIS — R54 Age-related physical debility: Secondary | ICD-10-CM | POA: Diagnosis present

## 2020-11-21 DIAGNOSIS — I482 Chronic atrial fibrillation, unspecified: Secondary | ICD-10-CM | POA: Diagnosis present

## 2020-11-21 DIAGNOSIS — N3281 Overactive bladder: Secondary | ICD-10-CM

## 2020-11-21 DIAGNOSIS — K222 Esophageal obstruction: Secondary | ICD-10-CM | POA: Diagnosis present

## 2020-11-21 DIAGNOSIS — K2289 Other specified disease of esophagus: Secondary | ICD-10-CM | POA: Diagnosis present

## 2020-11-21 LAB — COMPREHENSIVE METABOLIC PANEL WITH GFR
ALT: 9 U/L (ref 0–44)
AST: 18 U/L (ref 15–41)
Albumin: 3.3 g/dL — ABNORMAL LOW (ref 3.5–5.0)
Alkaline Phosphatase: 85 U/L (ref 38–126)
Anion gap: 8 (ref 5–15)
BUN: 17 mg/dL (ref 8–23)
CO2: 20 mmol/L — ABNORMAL LOW (ref 22–32)
Calcium: 8.2 mg/dL — ABNORMAL LOW (ref 8.9–10.3)
Chloride: 109 mmol/L (ref 98–111)
Creatinine, Ser: 0.59 mg/dL (ref 0.44–1.00)
GFR, Estimated: >60 mL/min
Glucose, Bld: 139 mg/dL — ABNORMAL HIGH (ref 70–99)
Potassium: 4 mmol/L (ref 3.5–5.1)
Sodium: 137 mmol/L (ref 135–145)
Total Bilirubin: 0.5 mg/dL (ref 0.3–1.2)
Total Protein: 6.8 g/dL (ref 6.5–8.1)

## 2020-11-21 LAB — CBC
HCT: 32.3 % — ABNORMAL LOW (ref 36.0–46.0)
Hemoglobin: 9.6 g/dL — ABNORMAL LOW (ref 12.0–15.0)
MCH: 27.5 pg (ref 26.0–34.0)
MCHC: 29.7 g/dL — ABNORMAL LOW (ref 30.0–36.0)
MCV: 92.6 fL (ref 80.0–100.0)
Platelets: 322 K/uL (ref 150–400)
RBC: 3.49 MIL/uL — ABNORMAL LOW (ref 3.87–5.11)
RDW: 14.7 % (ref 11.5–15.5)
WBC: 7.8 K/uL (ref 4.0–10.5)
nRBC: 0 % (ref 0.0–0.2)

## 2020-11-21 LAB — MAGNESIUM: Magnesium: 2.1 mg/dL (ref 1.7–2.4)

## 2020-11-21 LAB — BLOOD GAS, ARTERIAL
Acid-base deficit: 4.4 mmol/L — ABNORMAL HIGH (ref 0.0–2.0)
Bicarbonate: 21.1 mmol/L (ref 20.0–28.0)
FIO2: 34
O2 Saturation: 91.5 %
Patient temperature: 36.7
pCO2 arterial: 29.4 mmHg — ABNORMAL LOW (ref 32.0–48.0)
pH, Arterial: 7.43 (ref 7.350–7.450)
pO2, Arterial: 64.3 mmHg — ABNORMAL LOW (ref 83.0–108.0)

## 2020-11-21 LAB — APTT: aPTT: 36 s (ref 24–36)

## 2020-11-21 LAB — PROTIME-INR
INR: 1.2 (ref 0.8–1.2)
Prothrombin Time: 15.5 s — ABNORMAL HIGH (ref 11.4–15.2)

## 2020-11-21 LAB — PHOSPHORUS: Phosphorus: 3.2 mg/dL (ref 2.5–4.6)

## 2020-11-21 MED ORDER — INFLUENZA VAC A&B SA ADJ QUAD 0.5 ML IM PRSY: 0.5000 mL | PREFILLED_SYRINGE | INTRAMUSCULAR | Status: DC

## 2020-11-21 MED ORDER — ALBUTEROL SULFATE (2.5 MG/3ML) 0.083% IN NEBU
2.5000 mg | INHALATION_SOLUTION | RESPIRATORY_TRACT | Status: DC | PRN
  Administered 2020-11-21 – 2020-11-23 (×3): 2.5 mg via RESPIRATORY_TRACT
  Filled 2020-11-21 (×3): qty 3

## 2020-11-21 MED ORDER — VIBEGRON 75 MG PO TABS: 1.0000 | ORAL_TABLET | Freq: Every day | ORAL | Status: DC

## 2020-11-21 MED ORDER — DM-GUAIFENESIN ER 30-600 MG PO TB12
1.0000 | ORAL_TABLET | Freq: Two times a day (BID) | ORAL | Status: DC
  Administered 2020-11-21 – 2020-11-24 (×8): 1 via ORAL
  Filled 2020-11-21 (×8): qty 1

## 2020-11-21 MED ORDER — ACETAMINOPHEN 325 MG PO TABS
650.0000 mg | ORAL_TABLET | Freq: Four times a day (QID) | ORAL | Status: DC | PRN
  Administered 2020-11-21 – 2020-11-28 (×7): 650 mg via ORAL
  Filled 2020-11-21 (×7): qty 2

## 2020-11-21 MED ORDER — METOCLOPRAMIDE HCL 5 MG/ML IJ SOLN
10.0000 mg | Freq: Once | INTRAMUSCULAR | Status: AC
  Administered 2020-11-21: 10 mg via INTRAVENOUS
  Filled 2020-11-21: qty 2

## 2020-11-21 MED ORDER — AZITHROMYCIN 250 MG PO TABS
500.0000 mg | ORAL_TABLET | Freq: Every day | ORAL | Status: AC
  Administered 2020-11-21: 500 mg via ORAL
  Filled 2020-11-21: qty 2

## 2020-11-21 MED ORDER — ALBUTEROL SULFATE HFA 108 (90 BASE) MCG/ACT IN AERS: 2.0000 | INHALATION_SPRAY | Freq: Four times a day (QID) | RESPIRATORY_TRACT | Status: DC | PRN

## 2020-11-21 MED ORDER — ENOXAPARIN SODIUM 40 MG/0.4ML IJ SOSY: 40.0000 mg | PREFILLED_SYRINGE | INTRAMUSCULAR | Status: DC

## 2020-11-21 MED ORDER — APIXABAN 5 MG PO TABS
5.0000 mg | ORAL_TABLET | Freq: Two times a day (BID) | ORAL | Status: DC
  Administered 2020-11-21 – 2020-11-25 (×10): 5 mg via ORAL
  Filled 2020-11-21 (×11): qty 1

## 2020-11-21 MED ORDER — PROPRANOLOL HCL 20 MG PO TABS
40.0000 mg | ORAL_TABLET | Freq: Two times a day (BID) | ORAL | Status: DC
  Administered 2020-11-21 – 2020-11-25 (×9): 40 mg via ORAL
  Filled 2020-11-21 (×2): qty 2
  Filled 2020-11-21: qty 4
  Filled 2020-11-21 (×6): qty 2

## 2020-11-21 MED ORDER — TROSPIUM CHLORIDE ER 60 MG PO CP24: 1.0000 | ORAL_CAPSULE | Freq: Every day | ORAL | Status: DC

## 2020-11-21 MED ORDER — DIPHENHYDRAMINE HCL 50 MG/ML IJ SOLN
12.5000 mg | Freq: Once | INTRAMUSCULAR | Status: AC
  Administered 2020-11-21: 12.5 mg via INTRAVENOUS
  Filled 2020-11-21: qty 1

## 2020-11-21 MED ORDER — METHYLPREDNISOLONE SODIUM SUCC 40 MG IJ SOLR
40.0000 mg | Freq: Two times a day (BID) | INTRAMUSCULAR | Status: DC
  Administered 2020-11-21 – 2020-11-23 (×5): 40 mg via INTRAVENOUS
  Filled 2020-11-21 (×5): qty 1

## 2020-11-21 MED ORDER — PANTOPRAZOLE SODIUM 40 MG PO TBEC
40.0000 mg | DELAYED_RELEASE_TABLET | Freq: Every day | ORAL | Status: DC
  Administered 2020-11-21 – 2020-11-25 (×5): 40 mg via ORAL
  Filled 2020-11-21 (×5): qty 1

## 2020-11-21 MED ORDER — MELATONIN 3 MG PO TABS
6.0000 mg | ORAL_TABLET | Freq: Every day | ORAL | Status: DC
  Administered 2020-11-21 – 2020-11-28 (×7): 6 mg via ORAL
  Filled 2020-11-21 (×8): qty 2

## 2020-11-21 MED ORDER — AZITHROMYCIN 250 MG PO TABS
250.0000 mg | ORAL_TABLET | Freq: Every day | ORAL | Status: AC
Start: 2020-11-22 — End: 2020-11-25
  Administered 2020-11-22 – 2020-11-25 (×4): 250 mg via ORAL
  Filled 2020-11-21 (×4): qty 1

## 2020-11-21 NOTE — H&P (Addendum)
History and Physical  Monique Robles [id number] DOB: Jun 04, 1940 DOA: 11/20/2020  Referring physician: Daleen Bo, MD PCP: Celene Squibb, MD  Patient coming from: Home  Chief Complaint: Shortness of breath  HPI: Monique Robles is a 80 y.o. female with medical history significant for essential tremor, prior CVA, GERD, overactive bladder who presents to the emergency department by daughter due to low oxygen saturation at home today.  Patient has been having shortness of breath worse on exertion for several months, she was referred to cardiologist who cleared her after negative cardiac work-up, she was asked to go back to her PCP, so she could be referred to a pulmonologist.  Shortness of breath got worse over the weekend (9/9) whereby she appears to be in respiratory distress on minimal exertion and today, O2 sat was noted to be a 75% on room air with home pulse oximeter.  She complained of some productive cough but denies fever, chills, runny nose or nasal congestion.  Per daughter at bedside, patient just moved to this area about 2 years ago, prior to this, she underwent an intensive work-up at [place] whereby lung biopsy was done but no pathology was noted at that time.  She was on oxygen for some time, but she has been oxygen free dependent for at least 5 to 10 years per daughter.  ED Course:  In the emergency department, she was tachypneic, BP was 169/90, pulse 74 bpm, O2 sat ranged from 92% to 99% on supplemental oxygen via [place] at 2 L/min but this dropped to 87% on room air.  Work-up in the ED showed normocytic anemia and normal BMP except for hyperglycemia.  Influenza A, B, SARS coronavirus 2 was negative. Chest x-ray showed chronic interstitial lung disease without evidence of acute or active cardiopulmonary disease Breathing treatment was provided, Solu-Medrol 125 mg x 1 was given, patient was started on IV ceftriaxone 1 g daily. Hospitalist was asked to admit patient for further  evaluation and management.  Review of Systems: Constitutional: Negative for chills and fever.  HENT: Negative for ear pain and sore throat.   Eyes: Negative for pain and visual disturbance.  Respiratory: Positive for shortness of breath and cough.   Cardiovascular: Negative for chest pain and palpitations.  Gastrointestinal: Negative for abdominal pain and vomiting.  Endocrine: Negative for polyphagia and polyuria.  Genitourinary: Negative for decreased urine volume, dysuria, enuresis Musculoskeletal: Negative for arthralgias and back pain.  Skin: Negative for color change and rash.  Allergic/Immunologic: Negative for immunocompromised state.  Neurological: Positive for tremors, negative for syncope, speech difficulty Hematological: Does not bruise/bleed easily.  All other systems reviewed and are negative   Past Medical History:  Diagnosis Date   Aortic insufficiency    Atrial fibrillation (HCC)    Diastolic dysfunction    DOE (dyspnea on exertion)    GERD (gastroesophageal reflux disease)    History of pneumonia    History of pulmonary embolism    History of stroke    HTN (hypertension)    Hyperlipemia    Mitral valve regurgitation    Osteoporosis    Tremor    Vitamin D deficiency    Past Surgical History:  Procedure Laterality Date   BLADDER SURGERY     x 3   CERVICAL SPINE SURGERY     CHOLECYSTECTOMY     COLONOSCOPY     COLONOSCOPY WITH ESOPHAGOGASTRODUODENOSCOPY (EGD)     LUMBAR SPINE SURGERY     REPLACEMENT TOTAL KNEE Left  RIGHT LUNG WEDGE REMOVAL     TONSILLECTOMY      Social History:  reports that she has never smoked. She has never used smokeless tobacco. She reports that she does not currently use alcohol. She reports that she does not use drugs.   Allergies  Allergen Reactions   Coumadin [Warfarin] Hives   Atorvastatin Other (See Comments)    Muscle aches   Ciprofloxacin    Sulfa Antibiotics Itching and Rash    Family History  Problem  Relation Age of Onset   Alzheimer's disease Mother        died at 99   Cancer Mother    Tremor Mother    Pneumonia Father    Heart disease Father    Diabetes Father    Heart attack Father        died at 25     Prior to Admission medications   Medication Sig Start Date End Date Taking? Authorizing Provider  Acetaminophen (TYLENOL ARTHRITIS PAIN PO) Take 650 mg by mouth daily as needed.     [provider]  albuterol (PROVENTIL) (2.5 MG/3ML) 0.083% nebulizer solution Take 2.5 mg by nebulization every 4 (four) hours as needed. 06/21/20   [provider]  albuterol (VENTOLIN HFA) 108 (90 Base) MCG/ACT inhaler SMARTSIG:2 Puff(s) By Mouth Every 4 Hours PRN 06/21/20   [provider]  apixaban (ELIQUIS) 5 MG TABS tablet Take 5 mg by mouth 2 (two) times daily.    [provider]  calcium carbonate (TUMS - DOSED IN MG ELEMENTAL CALCIUM) 500 MG chewable tablet Chew 1 tablet by mouth daily as needed for indigestion or heartburn.    [provider]  diphenhydrAMINE HCl (BENADRYL PO) Take 1 tablet by mouth as needed (seasonal allergies).    [provider]  furosemide (LASIX) 20 MG tablet Take 20 mg by mouth daily. 07/31/20   [provider]  gabapentin (NEURONTIN) 600 MG tablet Take 600 mg by mouth 3 (three) times daily.    [provider]  HYDROcodone-acetaminophen (NORCO/VICODIN) 5-325 MG tablet Take 1 tablet by mouth 2 (two) times daily as needed. 07/31/20   [provider]  Melatonin 10 MG TABS Take 10 mg by mouth at bedtime.    [provider]  Multiple Vitamin (MULTIVITAMIN) tablet Take 1 tablet by mouth daily.    [provider]  nitrofurantoin (MACRODANTIN) 50 MG capsule Take 1 capsule (50 mg total) by mouth at bedtime. 09/28/20   McKenzie, Candee Furbish, MD  nitrofurantoin, macrocrystal-monohydrate, (MACROBID) 100 MG capsule Take 1 capsule (100 mg total) by mouth every 12 (twelve) hours. 08/24/20    McKenzie, Candee Furbish, MD  nitrofurantoin, macrocrystal-monohydrate, (MACROBID) 100 MG capsule Take 1 capsule (100 mg total) by mouth 2 (two) times daily. Stop taking low dose Macrodantin until this course is completed then restart. 10/02/20   McKenzie, Candee Furbish, MD  Nystatin POWD 2 application by Does not apply route as needed.    [provider]  omeprazole (PRILOSEC) 40 MG capsule Take 1 capsule by mouth daily. 06/21/20   [provider]  Potassium Chloride ER 20 MEQ TBCR Take 1 tablet by mouth daily. 07/31/20   [provider]  Probiotic Product (PROBIOTIC PO) Take 1 tablet by mouth daily.    [provider]  propranolol (INDERAL) 40 MG tablet Take 1 tablet (40 mg total) by mouth 2 (two) times daily. 08/15/19   Marcial Pacas, MD  Trospium Chloride 60 MG CP24 Take 1 capsule (60  mg total) by mouth daily. 09/28/20   McKenzie, Candee Furbish, MD  Vibegron (GEMTESA) 75 MG TABS Take 1 capsule by mouth daily. 09/20/20   McKenzie, Candee Furbish, MD  VITAMIN D PO Take 1,000 Units by mouth daily.    [provider]    Physical Exam: BP (!) 151/77   Pulse 70   Temp 98.1 F (36.7 C) (Oral)   Resp (!) 29   Ht '5\' 1"'$  (1.549 m)   Wt 83.9 kg   SpO2 94%   BMI 34.96 kg/m   General: 80 y.o. year-old female well developed well nourished in no acute distress.  Alert and oriented x3. HEENT: NCAT, EOMI Neck: Supple, trachea medial Cardiovascular: Regular rate and rhythm with no rubs or gallops.  No thyromegaly or JVD noted.  No lower extremity edema. 2/4 pulses in all 4 extremities. Respiratory: Clear to auscultation with no wheezes or rales.  Abdomen: Soft, nontender nondistended with normal bowel sounds x4 quadrants. Muskuloskeletal: No cyanosis, clubbing or edema noted bilaterally Neuro: CN II-XII intact, strength 5/5 x 4, sensation, reflexes intact Skin: No ulcerative lesions noted or rashes Psychiatry: Judgement and insight appear normal. Mood is appropriate for condition  and setting          Labs on Admission:  Basic Metabolic Panel: Recent Labs  Lab 11/20/20 2251  NA 138  K 3.7  CL 109  CO2 22  GLUCOSE 132*  BUN 20  CREATININE 0.79  CALCIUM 8.4*   Liver Function Tests: No results for input(s): AST, ALT, ALKPHOS, BILITOT, PROT, ALBUMIN in the last 168 hours. No results for input(s): LIPASE, AMYLASE in the last 168 hours. No results for input(s): AMMONIA in the last 168 hours. CBC: Recent Labs  Lab 11/20/20 2251  WBC 8.4  NEUTROABS 4.9  HGB 9.7*  HCT 32.2*  MCV 92.8  PLT 341   Cardiac Enzymes: No results for input(s): CKTOTAL, CKMB, CKMBINDEX, TROPONINI in the last 168 hours.  BNP (last 3 results) Recent Labs    08/31/20 0948  BNP 89.0    ProBNP (last 3 results) No results for input(s): PROBNP in the last 8760 hours.  CBG: No results for input(s): GLUCAP in the last 168 hours.  Radiological Exams on Admission: DG Chest 2 View  Result Date: 11/20/2020 CLINICAL DATA:  Worsening shortness of breath. EXAM: CHEST - 2 VIEW COMPARISON:  Jul 31, 2020 FINDINGS: Stable, mild to moderate severity diffuse chronic appearing increased interstitial lung markings are seen. There is no evidence of a pleural effusion or pneumothorax. The heart size and mediastinal contours are within normal limits. There is mild calcification of the aortic arch. A chronic sixth right rib deformity is seen. Degenerative changes are noted throughout the thoracic spine. IMPRESSION: Chronic interstitial lung disease without evidence of acute or active cardiopulmonary disease. Electronically Signed   By: Virgina Norfolk M.D.   On: 11/20/2020 21:09    EKG: I independently viewed the EKG done and my findings are as followed: Normal sinus rhythm at a rate of 73 bpm  Assessment/Plan Present on Admission:  Acute respiratory failure with hypoxia (HCC)  Essential tremor  Cerebrovascular accident (CVA) ([place])  Overactive bladder  Active Problems:   Essential tremor    Cerebrovascular accident (CVA) ([place])   Overactive bladder   Acute respiratory failure with hypoxia (HCC)   GERD (gastroesophageal reflux disease)  Acute respiratory failure with hypoxia possibly secondary to acute bronchitis Chest x-ray showed chronic interstitial lung disease without evidence of acute or active  cardiopulmonary disease Considering patient's chronic history of shortness of breath, it is possible she could have interstitial lung disease with possible superimposed acute bronchitis ABG done showed hypoxia Continue duo nebs, Mucinex, Solu-Medrol, azithromycin. Continue Protonix to prevent steroid-induced ulcer Continue incentive spirometry and flutter valve Continue supplemental oxygen to maintain O2 sat > 92% with plan to wean patient off oxygen as tolerated Pulmonology will be consulted and we shall await further recommendation  Essential tremor Continue propanolol  History of CVA Continue Eliquis, no statin was started in patient's med rec, we shall await updated med rec  GERD Continue Protonix  Overactive bladder Continue trospium and vibegron  Insomnia Continue melatonin  Obesity (BMI 34.96) Patient will be counseled on diet and lifestyle modification when more stable   DVT prophylaxis: Eliquis  Code Status: Full code  Family Communication: Daughter at bedside (all questions answered to satisfaction)  Disposition Plan:  Patient is from:                        home Anticipated DC to:                   SNF or family members home Anticipated DC date:               2-3 days Anticipated DC barriers:          Patient requires inpatient management due to hypoxia secondary to possible acute bronchitis    Consults called: Pulmonology  Admission status: Observation    Bernadette Hoit MD [facility]  11/21/2020, 2:21 AM

## 2020-11-21 NOTE — Progress Notes (Signed)
PROGRESS NOTE    Patient: Monique Robles                            PCP: Celene Squibb, MD                    DOB: 09/20/1940            DOA: 11/20/2020 [id number]             DOS: 11/21/2020, 1:15 PM   LOS: 0 days   Date of Service: The patient was seen and examined on 11/21/2020  Subjective:   The patient was seen and examined this morning. Awake alert, hemodynamically stable Still complaining of : Shortness of breath but denies any chest pain   Brief Narrative:    Monique Robles is a 80 y.o. female with medical history significant for essential tremor, prior CVA, GERD, overactive bladder who presents to the emergency department by daughter due to low oxygen saturation at home today.  Shortness of breath over past several month continue getting worse.  Status post recent extensive cardiac work-up which has been negative, PCP has referred to pulmonologist for further evaluation.    Shortness of breath got worse over the weekend (9/9) whereby she appears to be in respiratory distress on minimal exertion and today, O2 sat was noted to be a 75% on room air with home pulse oximeter.  She complained of some productive cough but denies fever, chills, runny nose or nasal congestion.    Per patient had an extensive work-up 2 years ago in [place] including lung biopsy with no specific pathology.  In ED was satting 87% room air, CBC CMP within normal limits, influenza A, B, SARS-CoV-2 all negative chest x-ray revealing chronic interstitial lung disease   Assessment & Plan:   Active Problems:   Essential tremor   Cerebrovascular accident (CVA) ([place])   Overactive bladder   Acute respiratory failure with hypoxia (HCC)   GERD (gastroesophageal reflux disease)   Bronchitis   Obesity (BMI 30-39.9)   Insomnia   Acute respiratory failure (HCC)   Acute respiratory failure with hypoxia possibly secondary to acute bronchitis -Currently stable satting 94% on 2 L of oxygen  -Chest  x-ray-reviewed, showed chronic interstitial lung disease without evidence of acute or active cardiopulmonary disease -ABG from last night reviewed, as needed ABG if persistent hypoxia -Continue mucolytic's, Mucinex, IV steroids, azithromycin, duo nebs  -Continue Protonix to prevent steroid-induced ulcer -Continue incentive spirometry and flutter valve -Continue supplemental oxygen to maintain O2 sat > 92% with plan to wean patient off oxygen as tolerated - Pulmonology will be consulted and we shall await further recommendation  Essential tremor -Stable, continue propanolol  History of CVA -Stable, continue Eliquis, no statin was started in patient's med rec, we shall await updated med rec   GERD Continue Protonix -Stable,  Overactive bladder Continue trospium and vibegron -Stable,   Insomnia Continue melatonin   Obesity (BMI 34.96) Patient will be counseled on diet and lifestyle modification when more stable     DVT prophylaxis: On Eliquis   Code Status: Full code (confirmed by patient)   Family Communication: No family present this a.m. Daughter was at bedside on admission   Disposition Plan:  Patient is from:                        home Anticipated DC to:  SNF or family members home Anticipated DC date:               2-3 days Anticipated DC barriers:          Patient requires inpatient management due to hypoxia secondary to possible acute bronchitis    Level of care: Med-Surg   Procedures:   No admission procedures for hospital encounter.    Antimicrobials:  Anti-infectives (From admission, onward)    Start     Dose/Rate Route Frequency Ordered Stop   11/22/20 1000  azithromycin (ZITHROMAX) tablet 250 mg       See Hyperspace for full Linked Orders Report.   250 mg Oral Daily 11/21/20 0227 11/26/20 0959   11/21/20 1000  azithromycin (ZITHROMAX) tablet 500 mg       See Hyperspace for full Linked Orders Report.   500 mg Oral Daily 11/21/20  0227 11/21/20 1116   11/20/20 2230  cefTRIAXone (ROCEPHIN) 1 g in sodium chloride 0.9 % 100 mL IVPB        1 g 200 mL/hr over 30 Minutes Intravenous  Once 11/20/20 2219 11/21/20 0107        Medication:   apixaban  5 mg Oral BID   [START ON 11/22/2020] azithromycin  250 mg Oral Daily   dextromethorphan-guaiFENesin  1 tablet Oral BID   melatonin  6 mg Oral QHS   methylPREDNISolone (SOLU-MEDROL) injection  40 mg Intravenous Q12H   pantoprazole  40 mg Oral Daily   propranolol  40 mg Oral BID   Trospium Chloride  1 capsule Oral Daily   Vibegron  1 capsule Oral Daily    albuterol   Objective:   Vitals:   11/21/20 0330 11/21/20 0400 11/21/20 0415 11/21/20 1222  BP: (!) 145/79 (!) 148/87  (!) 147/68  Pulse: 79 81 80 65  Resp: (!) 28 (!) 25 (!) 25 (!) 25  Temp:    97.8 F (36.6 C)  TempSrc:    Oral  SpO2: 90% 91% 95% 94%  Weight:      Height:       No intake or output data in the 24 hours ending 11/21/20 1315 Filed Weights   11/20/20 2015  Weight: 83.9 kg     Examination:   Physical Exam  Constitution:  Alert, cooperative, no distress,  Appears calm and comfortable  Psychiatric: Normal and stable mood and affect, cognition intact,   HEENT: Normocephalic, PERRL, otherwise with in Normal limits  Chest:Chest symmetric Cardio vascular:  S1/S2, RRR, No murmure, No Rubs or Gallops  pulmonary: Diffuse wheezes / no crackles Abdomen: Soft, non-tender, non-distended, bowel sounds,no masses, no organomegaly Muscular skeletal: Limited exam - in bed, able to move all 4 extremities, Normal strength,  Neuro: CNII-XII intact. , normal motor and sensation, reflexes intact  Extremities: No pitting edema lower extremities, +2 pulses  Skin: Dry, warm to touch, negative for any Rashes, No open wounds Wounds: per nursing documentation   --------------------------------------------------------------------------------------------------------------------------------    LABs:  CBC Latest  Ref Rng & Units 11/21/2020 11/20/2020 08/31/2020  WBC 4.0 - 10.5 K/uL 7.8 8.4 6.4  Hemoglobin 12.0 - 15.0 g/dL 9.6(L) 9.7(L) 12.3  Hematocrit 36.0 - 46.0 % 32.3(L) 32.2(L) 40.5  Platelets 150 - 400 K/uL 322 341 362   CMP Latest Ref Rng & Units 11/21/2020 11/20/2020 08/31/2020  Glucose 70 - 99 mg/dL 139(H) 132(H) 93  BUN 8 - 23 mg/dL '17 20 23  '$ Creatinine 0.44 - 1.00 mg/dL 0.59 0.79 0.73  Sodium 135 -  145 mmol/L 137 138 137  Potassium 3.5 - 5.1 mmol/L 4.0 3.7 4.4  Chloride 98 - 111 mmol/L 109 109 106  CO2 22 - 32 mmol/L 20(L) 22 24  Calcium 8.9 - 10.3 mg/dL 8.2(L) 8.4(L) 9.1  Total Protein 6.5 - 8.1 g/dL 6.8 - 7.3  Total Bilirubin 0.3 - 1.2 mg/dL 0.5 - 0.7  Alkaline Phos 38 - 126 U/L 85 - 86  AST 15 - 41 U/L 18 - 20  ALT 0 - 44 U/L 9 - 9       Micro Results Recent Results (from the past 240 hour(s))  Resp Panel by RT-PCR (Flu A&B, Covid) Nasopharyngeal Swab     Status: None   Collection Time: 11/20/20  9:56 PM   Specimen: Nasopharyngeal Swab; Nasopharyngeal(NP) swabs in vial transport medium  Result Value Ref Range Status   SARS Coronavirus 2 by RT PCR NEGATIVE NEGATIVE Final    Comment: (NOTE) SARS-CoV-2 target nucleic acids are NOT DETECTED.  The SARS-CoV-2 RNA is generally detectable in upper respiratory specimens during the acute phase of infection. The lowest concentration of SARS-CoV-2 viral copies this assay can detect is 138 copies/mL. A negative result does not preclude SARS-Cov-2 infection and should not be used as the sole basis for treatment or other patient management decisions. A negative result may occur with  improper specimen collection/handling, submission of specimen other than nasopharyngeal swab, presence of viral mutation(s) within the areas targeted by this assay, and inadequate number of viral copies(<138 copies/mL). A negative result must be combined with clinical observations, patient history, and epidemiological information. The expected result is  Negative.  Fact Sheet for Patients:  EntrepreneurPulse.com.au  Fact Sheet for Healthcare Providers:  IncredibleEmployment.be  This test is no t yet approved or cleared by the [place] FDA and  has been authorized for detection and/or diagnosis of SARS-CoV-2 by FDA under an Emergency Use Authorization (EUA). This EUA will remain  in effect (meaning this test can be used) for the duration of the COVID-19 declaration under Section 564(b)(1) of the Act, 21 U.S.C.section 360bbb-3(b)(1), unless the authorization is terminated  or revoked sooner.       Influenza A by PCR NEGATIVE NEGATIVE Final   Influenza B by PCR NEGATIVE NEGATIVE Final    Comment: (NOTE) The Xpert Xpress SARS-CoV-2/FLU/RSV plus assay is intended as an aid in the diagnosis of influenza from Nasopharyngeal swab specimens and should not be used as a sole basis for treatment. Nasal washings and aspirates are unacceptable for Xpert Xpress SARS-CoV-2/FLU/RSV testing.  Fact Sheet for Patients: EntrepreneurPulse.com.au  Fact Sheet for Healthcare Providers: IncredibleEmployment.be  This test is not yet approved or cleared by the [place] FDA and has been authorized for detection and/or diagnosis of SARS-CoV-2 by FDA under an Emergency Use Authorization (EUA). This EUA will remain in effect (meaning this test can be used) for the duration of the COVID-19 declaration under Section 564(b)(1) of the Act, 21 U.S.C. section 360bbb-3(b)(1), unless the authorization is terminated or revoked.  Performed at [facility], [address], [place], [place] [address]     Radiology Reports DG Chest 2 View  Result Date: 11/20/2020 CLINICAL DATA:  Worsening shortness of breath. EXAM: CHEST - 2 VIEW COMPARISON:  Jul 31, 2020 FINDINGS: Stable, mild to moderate severity diffuse chronic appearing increased interstitial lung markings are seen. There is no  evidence of a pleural effusion or pneumothorax. The heart size and mediastinal contours are within normal limits. There is mild calcification of the  aortic arch. A chronic sixth right rib deformity is seen. Degenerative changes are noted throughout the thoracic spine. IMPRESSION: Chronic interstitial lung disease without evidence of acute or active cardiopulmonary disease. Electronically Signed   By: Virgina Norfolk M.D.   On: 11/20/2020 21:09    SIGNED: Deatra James, MD, FHM. [facility],  Pager (please use [email or web address] to page/text) Please use Epic Secure Chat for non-urgent communication (7AM-7PM)  If 7PM-7AM, please contact night-coverage [email or web address], 11/21/2020, 1:15 PM

## 2020-11-22 ENCOUNTER — Inpatient Hospital Stay (HOSPITAL_COMMUNITY): Payer: Medicare Other

## 2020-11-22 DIAGNOSIS — J9601 Acute respiratory failure with hypoxia: Secondary | ICD-10-CM | POA: Diagnosis not present

## 2020-11-22 DIAGNOSIS — J40 Bronchitis, not specified as acute or chronic: Secondary | ICD-10-CM | POA: Diagnosis not present

## 2020-11-22 DIAGNOSIS — G250 Essential tremor: Secondary | ICD-10-CM | POA: Diagnosis not present

## 2020-11-22 LAB — IRON AND TIBC
Iron: 14 ug/dL — ABNORMAL LOW (ref 28–170)
Saturation Ratios: 3 % — ABNORMAL LOW (ref 10.4–31.8)
TIBC: 475 ug/dL — ABNORMAL HIGH (ref 250–450)
UIBC: 461 ug/dL

## 2020-11-22 LAB — TSH: TSH: 1.773 u[IU]/mL (ref 0.350–4.500)

## 2020-11-22 LAB — OCCULT BLOOD X 1 CARD TO LAB, STOOL: Fecal Occult Bld: POSITIVE — AB

## 2020-11-22 IMAGING — CT CT ANGIO CHEST
2 of 6 series · 18 of 46 positions shown · IV contrast (Omnipaque or Isovue)
Comparison: Chest radiograph [DATE]

CLINICAL DATA: Chest pain shortness of breath, pleurisy or effusion
suspected.

EXAM:
CT ANGIOGRAPHY CHEST WITH CONTRAST
TECHNIQUE: Multidetector CT imaging of the chest was performed using the
standard protocol during bolus administration of intravenous
contrast. Multiplanar CT image reconstructions and MIPs were
obtained to evaluate the vascular anatomy.
CONTRAST:  80mL OMNIPAQUE IOHEXOL 350 MG/ML SOLN

[Series 5: pe axial thins · axial · 0.70mm/px · z∈[+1130,+1366]mm · 15 of 324 slices shown]
[im 14/324  lung]
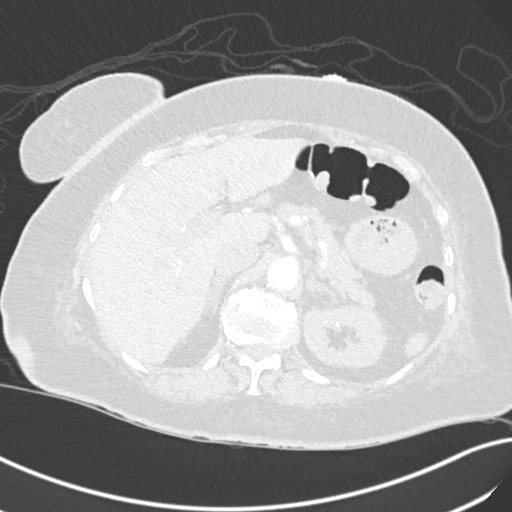
[im 41/324  soft-tissue]
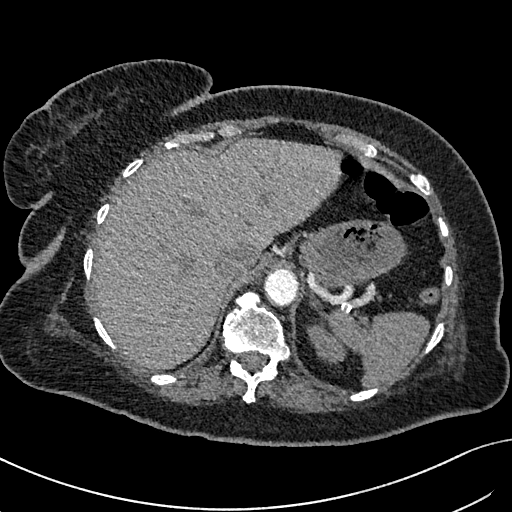
[im 54/324  lung]
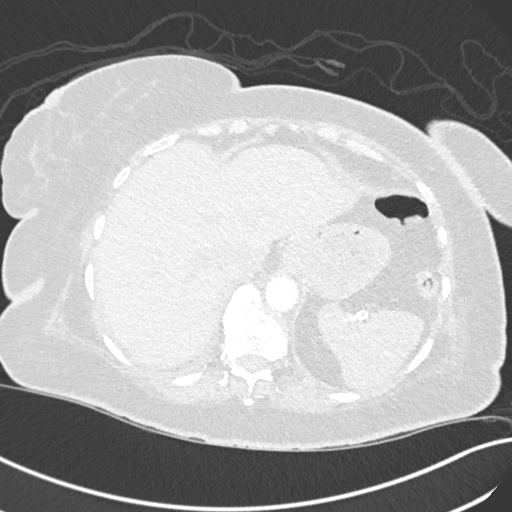
[im 81/324  soft-tissue]
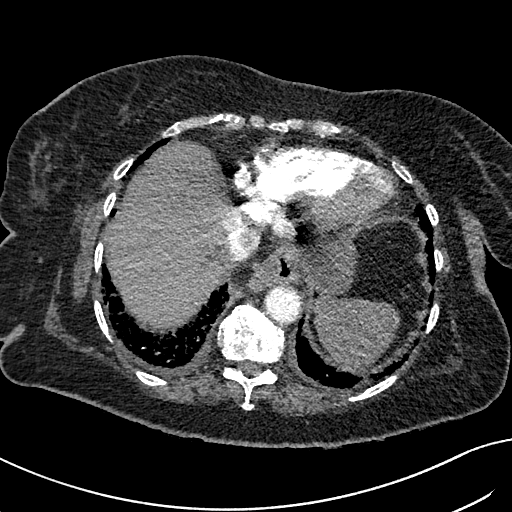
[im 95/324  lung]
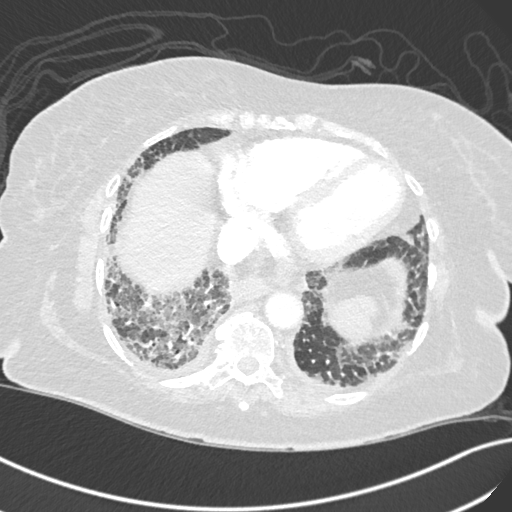
[im 122/324  soft-tissue]
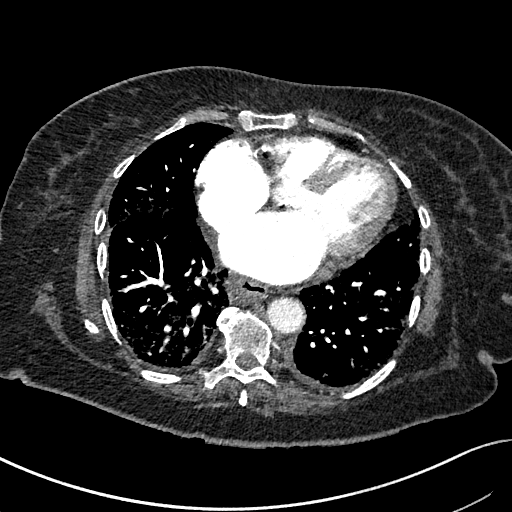
[im 135/324  lung]
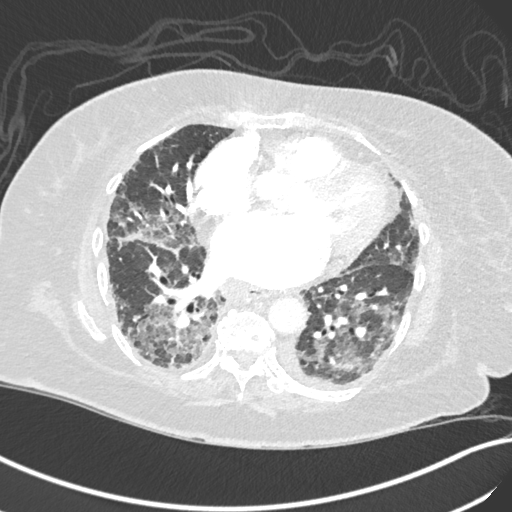
[im 162/324  soft-tissue]
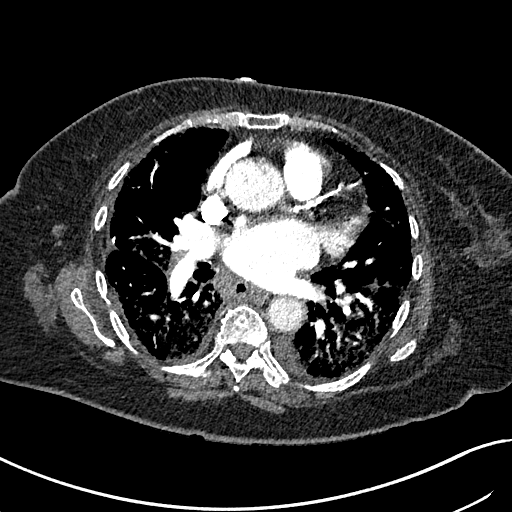
[im 189/324  lung]
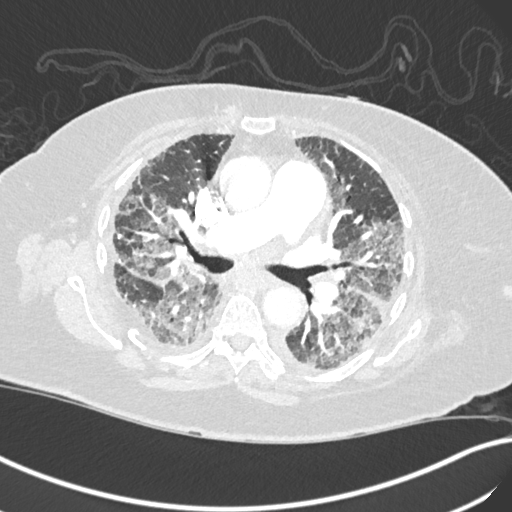
[im 202/324  soft-tissue]
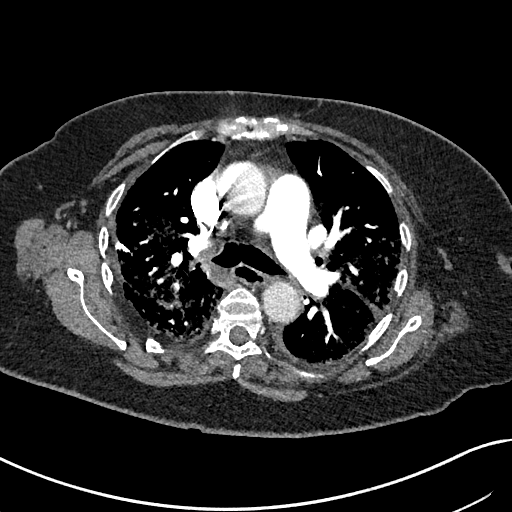
[im 229/324  lung]
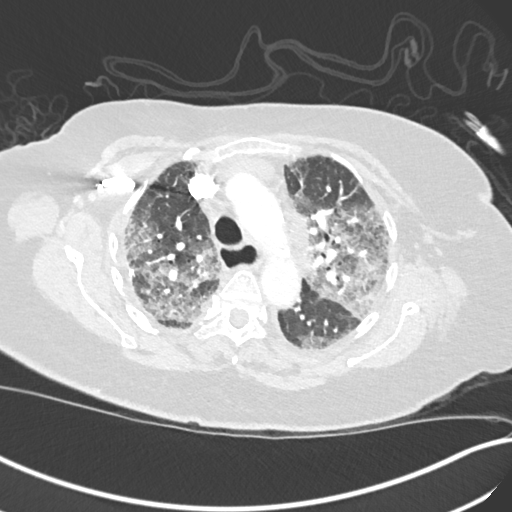
[im 243/324  soft-tissue]
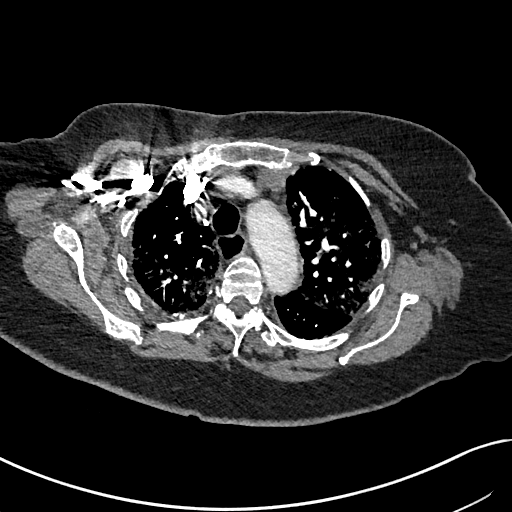
[im 270/324  lung]
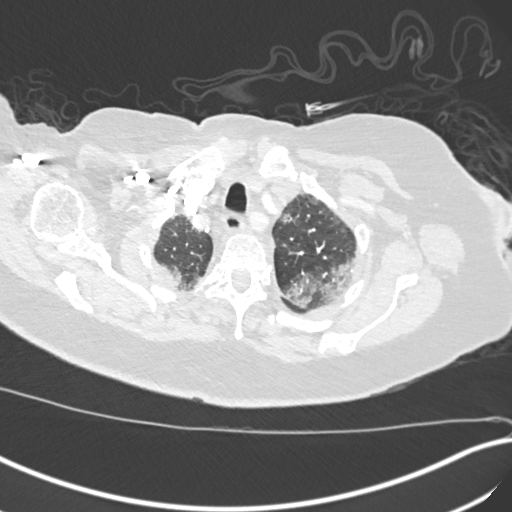
[im 283/324  soft-tissue]
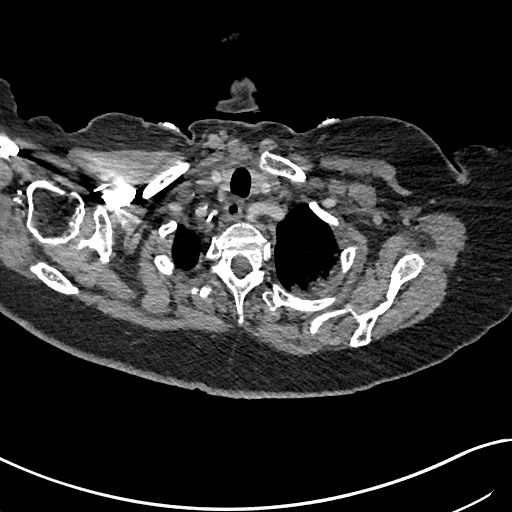
[im 310/324  lung]
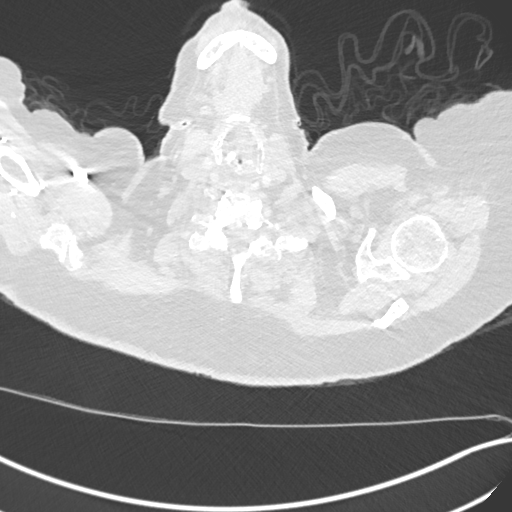

[Series 7: cor soft · coronal · 0.58mm/px · 3 of 138 slices shown]
[im 35/138  soft-tissue]
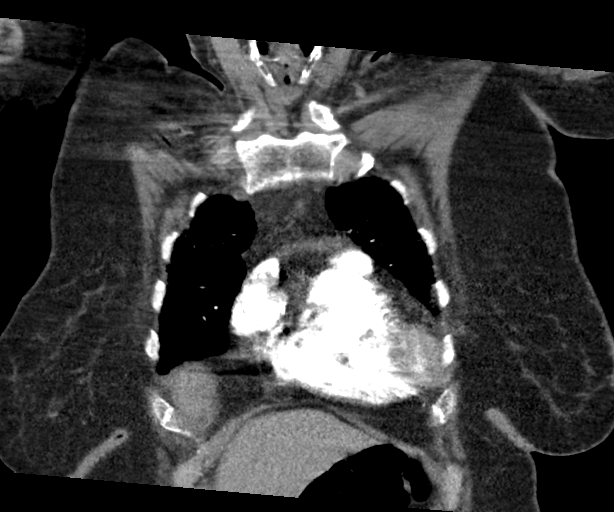
[im 69/138  soft-tissue]
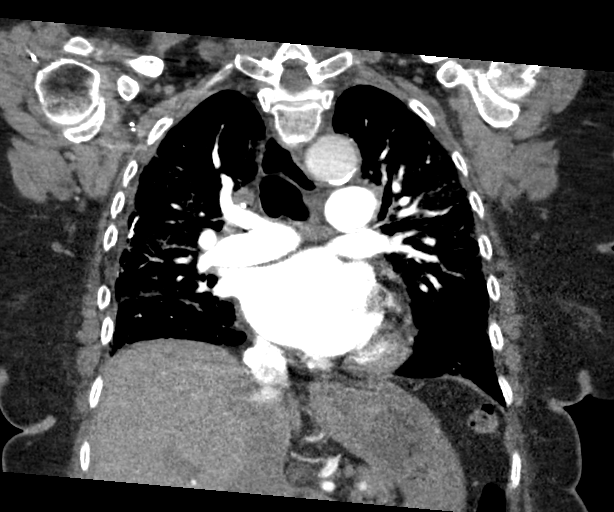
[im 103/138  soft-tissue]
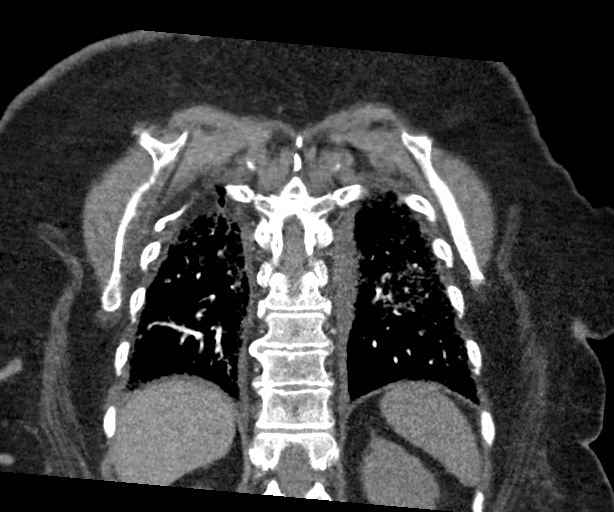

[18 of 46 positions shown; findings below may reference images not displayed]

FINDINGS: Cardiovascular: Satisfactory opacification of the pulmonary arteries
without evidence of pulmonary embolism. Heart size is upper limits
of normal. Left atrium is dilated measuring 5.1 cm in the AP
dimension. No significant pericardial effusion. Evidence for
coronary artery calcifications.

Mediastinum/Nodes: Enlarged prevascular lymph node measuring 1.2 cm
in the short axis on sequence 4, image 31. Prominent right hilar
lymph node measuring 1.0 cm in the short axis on sequence 4, image
47. Prevascular lymph node measuring 1.2 cm in the short axis on
sequence 4, image 27. Difficult to exclude wall thickening in the
mid/distal esophagus. Proximal and mid esophagus is distended.

Lungs/Pleura: Trachea and mainstem bronchi are patent. Diffuse
patchy bilateral airspace disease in both lungs and involving all of
the lobes. Calcified nodule along the right minor fissure on
sequence 6, image 64 that measures 5 mm. Trace bilateral pleural
fluid versus pleural thickening.

Upper Abdomen: No acute abnormality in the upper abdomen.

Musculoskeletal: No acute bone abnormality.

Review of the MIP images confirms the above findings.
IMPRESSION: 1. Multifocal pneumonia. Bilateral patchy airspace disease in both
lungs.
2. Negative for a pulmonary embolism.
3. Mild mediastinal and right hilar lymphadenopathy. Findings are
nonspecific but could be reactive. Consider surveillance.
4. Mild dilatation of the esophagus and questionable mild thickening
in the mid/distal esophagus. Findings are nonspecific.
5. Coronary artery calcifications.

## 2020-11-22 MED ORDER — BUDESONIDE 0.25 MG/2ML IN SUSP
0.2500 mg | Freq: Two times a day (BID) | RESPIRATORY_TRACT | Status: DC
  Administered 2020-11-22 – 2020-11-29 (×14): 0.25 mg via RESPIRATORY_TRACT
  Filled 2020-11-22 (×14): qty 2

## 2020-11-22 MED ORDER — LEVALBUTEROL HCL 1.25 MG/0.5ML IN NEBU
1.2500 mg | INHALATION_SOLUTION | Freq: Three times a day (TID) | RESPIRATORY_TRACT | Status: DC
  Administered 2020-11-22 – 2020-11-23 (×5): 1.25 mg via RESPIRATORY_TRACT
  Filled 2020-11-22 (×5): qty 0.5

## 2020-11-22 MED ORDER — IPRATROPIUM BROMIDE 0.02 % IN SOLN
0.5000 mg | Freq: Three times a day (TID) | RESPIRATORY_TRACT | Status: DC
  Administered 2020-11-22 – 2020-11-23 (×5): 0.5 mg via RESPIRATORY_TRACT
  Filled 2020-11-22 (×5): qty 2.5

## 2020-11-22 MED ORDER — ONDANSETRON HCL 4 MG PO TABS
4.0000 mg | ORAL_TABLET | Freq: Three times a day (TID) | ORAL | Status: DC | PRN
  Administered 2020-11-22 – 2020-11-26 (×2): 4 mg via ORAL
  Filled 2020-11-22 (×2): qty 1

## 2020-11-22 MED ORDER — IOHEXOL 350 MG/ML SOLN
75.0000 mL | Freq: Once | INTRAVENOUS | Status: AC | PRN
  Administered 2020-11-22: 80 mL via INTRAVENOUS

## 2020-11-22 NOTE — Consult Note (Signed)
[facility]   Patient name: Monique Robles Admit date: 11/20/2020  DOB: 02/07/1941 LOS: 1  MRN: [id number] Consult date: 11/22/2020  Referring provider: Dr. Roger Shelter, [facility]: Short of breath    History:  80 yo female presented to [facility] ER with dyspnea and intermittent headache for several days.  Associated with leg swelling.  SpO2 85% on room air.  Was using albuterol nebulizer more frequently.  Having productive cough.  She moved from [place] to [place] about 2 years ago to live with her daughter after she had a stroke and her husband passed away.  She had pulmonary assessment there which included lung biopsy in the early 2000's for pneumonia.  She doesn't recall what the diagnosis was or what therapy she had.  She had pneumonia again a few years ago and was told she should use nebulizer medication.  She does not have history of smoking and was never told she has asthma.  She was not using supplemental oxygen prior to this admission.  She had recent cardiology assessment that was unrevealing.  Chest xray from 08/01/20 showed interstitial thickening and similar findings on chest xray from 11/20/20.  Her CT chest report from 2018 was unremarkable.  Past medical history:  A fib, Diastolic CHF, GERD, PE, Pneumonia, CVA, HTN, MVR, Osteoporosis, Tremor, Vit D deficiency, Overactive bladder  Significant events:  9/13 Admit  Studies:  CT chest 02/23/17 >> post operative changes Rt lung, 5 mm nodule RUL Echo 09/14/20 >> EF 65 to 70%, grade 1 DD, mild LA dilation, mild MR, mild AS  Micro:  COVID/Flu 9/13 >> negative  Lines:     Antibiotics:  Rocephin 9/13 Zithromax 9/14 >>   Consults:      Interim history:    Vital signs:  BP 128/71 (BP Location: Left Arm)   Pulse 64   Temp 97.8 F (36.6 C) (Oral)   Resp 18   Ht '5\' 1"'  (1.549 m)   Wt 85.2 kg   SpO2 94%   BMI 35.49 kg/m   Intake/output:  I/O last 3 completed  shifts: In: 1240 [P.O.:240; I.V.:1000] Out: -    Physical exam:   General - alert Eyes - pupils reactive ENT - no sinus tenderness, no stridor Cardiac - regular rate/rhythm, no murmur Chest - faint basilar crackles Abdomen - soft, non tender, + bowel sounds Extremities - no cyanosis, clubbing, or edema Skin - no rashes Neuro - normal strength, moves extremities, follows commands Psych - normal mood and behavior   Best practice:   DVT - eliquis SUP - protonix Nutrition - heart healthy Mobility - as tolerated   Assessment/plan:   Acute hypoxic respiratory failure. - cause not clear based on current findings - she seems to have subacute onset of interstitial changes on chest imaging studies - agree with CT chest to further assess - will check ESR, ANA, RF, ANCA - day 3 of ABx, currently on zithromax - depending on findings from above tests, will then determine next steps - continue supplemental oxygen to keep SpO2 > 92% - negative fluid balance as tolerated   D/w Dr. Roger Shelter  Resolved hospital problems:    Goals of care/Family discussions:  Code status: full code  Labs:   CMP Latest Ref Rng & Units 11/21/2020 11/20/2020 08/31/2020  Glucose 70 - 99 mg/dL 139(H) 132(H) 93  BUN 8 - 23 mg/dL '17 20 23  ' Creatinine 0.44 - 1.00 mg/dL 0.59 0.79 0.73  Sodium 135 - 145  mmol/L 137 138 137  Potassium 3.5 - 5.1 mmol/L 4.0 3.7 4.4  Chloride 98 - 111 mmol/L 109 109 106  CO2 22 - 32 mmol/L 20(L) 22 24  Calcium 8.9 - 10.3 mg/dL 8.2(L) 8.4(L) 9.1  Total Protein 6.5 - 8.1 g/dL 6.8 - 7.3  Total Bilirubin 0.3 - 1.2 mg/dL 0.5 - 0.7  Alkaline Phos 38 - 126 U/L 85 - 86  AST 15 - 41 U/L 18 - 20  ALT 0 - 44 U/L 9 - 9    CBC Latest Ref Rng & Units 11/21/2020 11/20/2020 08/31/2020  WBC 4.0 - 10.5 K/uL 7.8 8.4 6.4  Hemoglobin 12.0 - 15.0 g/dL 9.6(L) 9.7(L) 12.3  Hematocrit 36.0 - 46.0 % 32.3(L) 32.2(L) 40.5  Platelets 150 - 400 K/uL 322 341 362    ABG    Component Value Date/Time    PHART 7.430 11/21/2020 0215   PCO2ART 29.4 (L) 11/21/2020 0215   PO2ART 64.3 (L) 11/21/2020 0215   HCO3 21.1 11/21/2020 0215   ACIDBASEDEF 4.4 (H) 11/21/2020 0215   O2SAT 91.5 11/21/2020 0215    CBG (last 3)  No results for input(s): GLUCAP in the last 72 hours.   Past surgical history:  She  has a past surgical history that includes Bladder surgery; Cholecystectomy; Tonsillectomy; RIGHT LUNG WEDGE REMOVAL; Colonoscopy; Colonoscopy with esophagogastroduodenoscopy (egd); Replacement total knee (Left); Cervical spine surgery; and Lumbar spine surgery.  Social history:  She  reports that she has never smoked. She has never used smokeless tobacco. She reports that she does not currently use alcohol. She reports that she does not use drugs.   Review of systems:  Reviewed and negative Family history:  Her family history includes Alzheimer's disease in her mother; Cancer in her mother; Diabetes in her father; Heart attack in her father; Heart disease in her father; Pneumonia in her father; Tremor in her mother.    Medications:   No current facility-administered medications on file prior to encounter.   Current Outpatient Medications on File Prior to Encounter  Medication Sig   Acetaminophen (TYLENOL ARTHRITIS PAIN PO) Take 650 mg by mouth daily as needed (pain).   albuterol (PROVENTIL) (2.5 MG/3ML) 0.083% nebulizer solution Take 2.5 mg by nebulization every 4 (four) hours as needed for wheezing or shortness of breath.   albuterol (VENTOLIN HFA) 108 (90 Base) MCG/ACT inhaler Inhale 1-2 puffs into the lungs every 4 (four) hours as needed for shortness of breath or wheezing.   apixaban (ELIQUIS) 5 MG TABS tablet Take 5 mg by mouth 2 (two) times daily.   calcium carbonate (TUMS - DOSED IN MG ELEMENTAL CALCIUM) 500 MG chewable tablet Chew 1 tablet by mouth daily as needed for indigestion or heartburn.   cyclobenzaprine (FLEXERIL) 5 MG tablet Take 5 mg by mouth daily as needed.   diphenhydrAMINE  HCl (BENADRYL PO) Take 1 tablet by mouth as needed (seasonal allergies).   furosemide (LASIX) 20 MG tablet Take 20 mg by mouth daily.   gabapentin (NEURONTIN) 600 MG tablet Take 600 mg by mouth 3 (three) times daily.   HYDROcodone-acetaminophen (NORCO/VICODIN) 5-325 MG tablet Take 1 tablet by mouth 2 (two) times daily as needed for moderate pain.   Melatonin 10 MG TABS Take 10 mg by mouth at bedtime.   Multiple Vitamin (MULTIVITAMIN) tablet Take 1 tablet by mouth daily.   nitrofurantoin (MACRODANTIN) 50 MG capsule Take 1 capsule (50 mg total) by mouth at bedtime.   omeprazole (PRILOSEC) 40 MG capsule Take 1 capsule by  mouth daily.   Potassium Chloride ER 20 MEQ TBCR Take 1 tablet by mouth daily.   propranolol (INDERAL) 40 MG tablet Take 1 tablet (40 mg total) by mouth 2 (two) times daily.   Vibegron (GEMTESA) 75 MG TABS Take 1 capsule by mouth daily.   VITAMIN D PO Take 1,000 Units by mouth daily.   nitrofurantoin, macrocrystal-monohydrate, (MACROBID) 100 MG capsule Take 1 capsule (100 mg total) by mouth every 12 (twelve) hours. (Patient not taking: No sig reported)   nitrofurantoin, macrocrystal-monohydrate, (MACROBID) 100 MG capsule Take 1 capsule (100 mg total) by mouth 2 (two) times daily. Stop taking low dose Macrodantin until this course is completed then restart. (Patient not taking: No sig reported)   Trospium Chloride 60 MG CP24 Take 1 capsule (60 mg total) by mouth daily. (Patient not taking: No sig reported)     Signature:  Chesley Mires, MD [place] Pager - [phone] 11/22/2020, 3:54 PM

## 2020-11-22 NOTE — Progress Notes (Addendum)
Came in to give patient breathing treatment.  Sat was at 88% but patient had IS in hand and had stated that she had been doing it like instructed.  I encouraged her to take some nice deep breaths and O2 sat came up to 94%.  Encouraged her to keep taking nice deep breaths during day.  Patient is currently on 4L [place].  BS appear to be clear and diminished.  Will continue to monitor.

## 2020-11-22 NOTE — Progress Notes (Signed)
Gave patient flutter to use and explained usage to her.  Left it at bedside for patient to use and IS is at bedside as well.

## 2020-11-22 NOTE — Progress Notes (Signed)
PROGRESS NOTE    Patient: Monique Robles                            PCP: Celene Squibb, MD                    DOB: Sep 09, 1940            DOA: 11/20/2020 [id number]             DOS: 11/22/2020, 12:02 PM   LOS: 1 day   Date of Service: The patient was seen and examined on 11/22/2020  Subjective:   The patient was seen and examined this morning. Awake alert, hemodynamically stable Still complaining shortness of breath, requiring 4 L of oxygen to maintain O2 sat greater 92%.   Brief Narrative:    Monique Robles is a 80 y.o. female with medical history significant for essential tremor, prior CVA, GERD, overactive bladder who presents to the emergency department by daughter due to low oxygen saturation at home today.  Shortness of breath over past several month continue getting worse.  Status post recent extensive cardiac work-up which has been negative, PCP has referred to pulmonologist for further evaluation.    Shortness of breath got worse over the weekend (9/9) whereby she appears to be in respiratory distress on minimal exertion and today, O2 sat was noted to be a 75% on room air with home pulse oximeter.  She complained of some productive cough but denies fever, chills, runny nose or nasal congestion.    Per patient had an extensive work-up 2 years ago in [place] including lung biopsy with no specific pathology.  In ED was satting 87% room air, CBC CMP within normal limits, influenza A, B, SARS-CoV-2 all negative chest x-ray revealing chronic interstitial lung disease   Assessment & Plan:   Active Problems:   Essential tremor   Cerebrovascular accident (CVA) ([place])   Overactive bladder   Acute respiratory failure with hypoxia (HCC)   GERD (gastroesophageal reflux disease)   Bronchitis   Obesity (BMI 30-39.9)   Insomnia   Acute respiratory failure (HCC)   Acute respiratory failure with hypoxia possibly secondary to acute bronchitis -Remains in respiratory distress,  requiring now up to 4 L of oxygen, to maintain O2 sat greater 92%.  -Chest x-ray-reviewed, showed chronic interstitial lung disease without evidence of acute or active cardiopulmonary disease -ABG from last night reviewed, as needed ABG if persistent hypoxia -We will cont. mucolytic's, Mucinex, IV steroids, azithromycin, duo nebs  -Continue Protonix to prevent steroid-induced ulcer -Continue incentive spirometry and flutter valve -Continue supplemental oxygen to maintain O2 sat > 92% with plan to wean patient off oxygen as tolerated - Pulmonology will be consulted and we shall await further recommendation  Essential tremor -stable,  continue propanolol  History of CVA -Stable, continue Eliquis, no statin was started in patient's med rec, we shall await updated med rec   GERD Continue Protonix Stable   Overactive bladder Continue trospium and vibegron Stable   Insomnia Continue melatonin   Obesity (BMI 34.96) Patient will be counseled on diet and lifestyle modification when more stable   Chronic anemia -No complaint of acute bleeding or hematochezia, -We will obtain iron studies, Hemoccult.    DVT prophylaxis: On Eliquis   Code Status: Full code (confirmed by patient)   Family Communication: No family present this a.m. Daughter was at bedside on admission   Disposition Plan:  Patient is from:                        home Anticipated DC to:                   SNF or family members home with [facility]  Anticipated DC date:               2-3 days Anticipated DC barriers:          Patient requires inpatient management due to hypoxia secondary to possible acute bronchitis    Level of care: Med-Surg   Procedures:   No admission procedures for hospital encounter.    Antimicrobials:  Anti-infectives (From admission, onward)    Start     Dose/Rate Route Frequency Ordered Stop   11/22/20 1000  azithromycin (ZITHROMAX) tablet 250 mg       See Hyperspace for full Linked  Orders Report.   250 mg Oral Daily 11/21/20 0227 11/26/20 0959   11/21/20 1000  azithromycin (ZITHROMAX) tablet 500 mg       See Hyperspace for full Linked Orders Report.   500 mg Oral Daily 11/21/20 0227 11/21/20 1116   11/20/20 2230  cefTRIAXone (ROCEPHIN) 1 g in sodium chloride 0.9 % 100 mL IVPB        1 g 200 mL/hr over 30 Minutes Intravenous  Once 11/20/20 2219 11/21/20 0107        Medication:   apixaban  5 mg Oral BID   azithromycin  250 mg Oral Daily   dextromethorphan-guaiFENesin  1 tablet Oral BID   influenza vaccine adjuvanted  0.5 mL Intramuscular Tomorrow-1000   melatonin  6 mg Oral QHS   methylPREDNISolone (SOLU-MEDROL) injection  40 mg Intravenous Q12H   pantoprazole  40 mg Oral Daily   propranolol  40 mg Oral BID   Trospium Chloride  1 capsule Oral Daily   Vibegron  1 capsule Oral Daily    acetaminophen, albuterol   Objective:   Vitals:   11/21/20 2025 11/21/20 2030 11/21/20 2122 11/22/20 0559  BP:  (!) 169/82 (!) 158/76 128/71  Pulse: 69 71 73 64  Resp: (!) '21 18 20 18  '$ Temp:   97.8 F (36.6 C) 97.8 F (36.6 C)  TempSrc:   Oral Oral  SpO2: 90% 93% 92% 94%  Weight:   85.2 kg   Height:        Intake/Output Summary (Last 24 hours) at 11/22/2020 1202 Last data filed at 11/22/2020 0700 Gross per 24 hour  Intake 1240 ml  Output --  Net 1240 ml   Filed Weights   11/20/20 2015 11/21/20 2122  Weight: 83.9 kg 85.2 kg     Examination:      Physical Exam:   General:  Alert, oriented, cooperative, no distress;   HEENT:  Normocephalic, PERRL, otherwise with in Normal limits   Neuro:  CNII-XII intact. , normal motor and sensation, reflexes intact   Lungs:   Diffuse Rales, Respirations unlabored, mild  wheezes / No crackles  Cardio:    S1/S2, RRR, No murmure, No Rubs or Gallops   Abdomen:   Soft, non-tender, bowel sounds active all four quadrants,  no guarding or peritoneal signs.  Muscular skeletal:  Limited exam - in bed, able to move all 4  extremities, Normal strength,  2+ pulses,  symmetric, No pitting edema  Skin:  Dry, warm to touch, negative for any Rashes,  Wounds: Please see nursing documentation        -------------------------------------------------------------------------------------------------------------------------  LABs:  CBC Latest Ref Rng & Units 11/21/2020 11/20/2020 08/31/2020  WBC 4.0 - 10.5 K/uL 7.8 8.4 6.4  Hemoglobin 12.0 - 15.0 g/dL 9.6(L) 9.7(L) 12.3  Hematocrit 36.0 - 46.0 % 32.3(L) 32.2(L) 40.5  Platelets 150 - 400 K/uL 322 341 362   CMP Latest Ref Rng & Units 11/21/2020 11/20/2020 08/31/2020  Glucose 70 - 99 mg/dL 139(H) 132(H) 93  BUN 8 - 23 mg/dL '17 20 23  '$ Creatinine 0.44 - 1.00 mg/dL 0.59 0.79 0.73  Sodium 135 - 145 mmol/L 137 138 137  Potassium 3.5 - 5.1 mmol/L 4.0 3.7 4.4  Chloride 98 - 111 mmol/L 109 109 106  CO2 22 - 32 mmol/L 20(L) 22 24  Calcium 8.9 - 10.3 mg/dL 8.2(L) 8.4(L) 9.1  Total Protein 6.5 - 8.1 g/dL 6.8 - 7.3  Total Bilirubin 0.3 - 1.2 mg/dL 0.5 - 0.7  Alkaline Phos 38 - 126 U/L 85 - 86  AST 15 - 41 U/L 18 - 20  ALT 0 - 44 U/L 9 - 9       Micro Results Recent Results (from the past 240 hour(s))  Resp Panel by RT-PCR (Flu A&B, Covid) Nasopharyngeal Swab     Status: None   Collection Time: 11/20/20  9:56 PM   Specimen: Nasopharyngeal Swab; Nasopharyngeal(NP) swabs in vial transport medium  Result Value Ref Range Status   SARS Coronavirus 2 by RT PCR NEGATIVE NEGATIVE Final    Comment: (NOTE) SARS-CoV-2 target nucleic acids are NOT DETECTED.  The SARS-CoV-2 RNA is generally detectable in upper respiratory specimens during the acute phase of infection. The lowest concentration of SARS-CoV-2 viral copies this assay can detect is 138 copies/mL. A negative result does not preclude SARS-Cov-2 infection and should not be used as the sole basis for treatment or other patient management decisions. A negative result may occur with  improper specimen  collection/handling, submission of specimen other than nasopharyngeal swab, presence of viral mutation(s) within the areas targeted by this assay, and inadequate number of viral copies(<138 copies/mL). A negative result must be combined with clinical observations, patient history, and epidemiological information. The expected result is Negative.  Fact Sheet for Patients:  EntrepreneurPulse.com.au  Fact Sheet for Healthcare Providers:  IncredibleEmployment.be  This test is no t yet approved or cleared by the [place] FDA and  has been authorized for detection and/or diagnosis of SARS-CoV-2 by FDA under an Emergency Use Authorization (EUA). This EUA will remain  in effect (meaning this test can be used) for the duration of the COVID-19 declaration under Section 564(b)(1) of the Act, 21 U.S.C.section 360bbb-3(b)(1), unless the authorization is terminated  or revoked sooner.       Influenza A by PCR NEGATIVE NEGATIVE Final   Influenza B by PCR NEGATIVE NEGATIVE Final    Comment: (NOTE) The Xpert Xpress SARS-CoV-2/FLU/RSV plus assay is intended as an aid in the diagnosis of influenza from Nasopharyngeal swab specimens and should not be used as a sole basis for treatment. Nasal washings and aspirates are unacceptable for Xpert Xpress SARS-CoV-2/FLU/RSV testing.  Fact Sheet for Patients: EntrepreneurPulse.com.au  Fact Sheet for Healthcare Providers: IncredibleEmployment.be  This test is not yet approved or cleared by the [place] FDA and has been authorized for detection and/or diagnosis of SARS-CoV-2 by FDA under an Emergency Use Authorization (EUA). This EUA will remain in effect (meaning this test can be used) for the duration of the COVID-19 declaration under Section 564(b)(1) of the Act, 21 U.S.C. section 360bbb-3(b)(1), unless the authorization  is terminated or revoked.  Performed at [facility], [address], [place], [place] [address]     Radiology Reports DG Chest 2 View  Result Date: 11/20/2020 CLINICAL DATA:  Worsening shortness of breath. EXAM: CHEST - 2 VIEW COMPARISON:  Jul 31, 2020 FINDINGS: Stable, mild to moderate severity diffuse chronic appearing increased interstitial lung markings are seen. There is no evidence of a pleural effusion or pneumothorax. The heart size and mediastinal contours are within normal limits. There is mild calcification of the aortic arch. A chronic sixth right rib deformity is seen. Degenerative changes are noted throughout the thoracic spine. IMPRESSION: Chronic interstitial lung disease without evidence of acute or active cardiopulmonary disease. Electronically Signed   By: Virgina Norfolk M.D.   On: 11/20/2020 21:09    SIGNED: Deatra James, MD, FHM. [facility],  Pager (please use [email or web address] to page/text) Please use Epic Secure Chat for non-urgent communication (7AM-7PM)  If 7PM-7AM, please contact night-coverage [email or web address], 11/22/2020, 12:02 PM

## 2020-11-23 DIAGNOSIS — J189 Pneumonia, unspecified organism: Secondary | ICD-10-CM

## 2020-11-23 DIAGNOSIS — D500 Iron deficiency anemia secondary to blood loss (chronic): Secondary | ICD-10-CM | POA: Diagnosis present

## 2020-11-23 DIAGNOSIS — J9601 Acute respiratory failure with hypoxia: Secondary | ICD-10-CM | POA: Diagnosis not present

## 2020-11-23 LAB — CBC WITH DIFFERENTIAL/PLATELET
Abs Immature Granulocytes: 0.12 10*3/uL — ABNORMAL HIGH (ref 0.00–0.07)
Basophils Absolute: 0 10*3/uL (ref 0.0–0.1)
Basophils Relative: 0 %
Eosinophils Absolute: 0 10*3/uL (ref 0.0–0.5)
Eosinophils Relative: 0 %
HCT: 28.8 % — ABNORMAL LOW (ref 36.0–46.0)
Hemoglobin: 8.6 g/dL — ABNORMAL LOW (ref 12.0–15.0)
Immature Granulocytes: 1 %
Lymphocytes Relative: 6 %
Lymphs Abs: 0.9 10*3/uL (ref 0.7–4.0)
MCH: 28.3 pg (ref 26.0–34.0)
MCHC: 29.9 g/dL — ABNORMAL LOW (ref 30.0–36.0)
MCV: 94.7 fL (ref 80.0–100.0)
Monocytes Absolute: 0.5 10*3/uL (ref 0.1–1.0)
Monocytes Relative: 3 %
Neutro Abs: 13.5 10*3/uL — ABNORMAL HIGH (ref 1.7–7.7)
Neutrophils Relative %: 90 %
Platelets: 363 10*3/uL (ref 150–400)
RBC: 3.04 MIL/uL — ABNORMAL LOW (ref 3.87–5.11)
RDW: 15 % (ref 11.5–15.5)
WBC: 15 10*3/uL — ABNORMAL HIGH (ref 4.0–10.5)
nRBC: 0.2 % (ref 0.0–0.2)

## 2020-11-23 LAB — BASIC METABOLIC PANEL
Anion gap: 9 (ref 5–15)
BUN: 27 mg/dL — ABNORMAL HIGH (ref 8–23)
CO2: 21 mmol/L — ABNORMAL LOW (ref 22–32)
Calcium: 8.4 mg/dL — ABNORMAL LOW (ref 8.9–10.3)
Chloride: 109 mmol/L (ref 98–111)
Creatinine, Ser: 0.6 mg/dL (ref 0.44–1.00)
GFR, Estimated: >60 mL/min (ref >60)
Glucose, Bld: 121 mg/dL — ABNORMAL HIGH (ref 70–99)
Potassium: 4.3 mmol/L (ref 3.5–5.1)
Sodium: 139 mmol/L (ref 135–145)

## 2020-11-23 LAB — BLOOD GAS, ARTERIAL
Acid-base deficit: 3.2 mmol/L — ABNORMAL HIGH (ref 0.0–2.0)
Bicarbonate: 21.7 mmol/L (ref 20.0–28.0)
FIO2: 44
O2 Saturation: 86.7 %
Patient temperature: 37
pCO2 arterial: 33.4 mmHg (ref 32.0–48.0)
pH, Arterial: 7.408 (ref 7.350–7.450)
pO2, Arterial: 53.5 mmHg — ABNORMAL LOW (ref 83.0–108.0)

## 2020-11-23 LAB — BRAIN NATRIURETIC PEPTIDE: B Natriuretic Peptide: 634 pg/mL — ABNORMAL HIGH (ref 0.0–100.0)

## 2020-11-23 LAB — SEDIMENTATION RATE: Sed Rate: 45 mm/hr — ABNORMAL HIGH (ref 0–22)

## 2020-11-23 MED ORDER — METHYLPREDNISOLONE SODIUM SUCC 125 MG IJ SOLR: 60.0000 mg | Freq: Three times a day (TID) | INTRAMUSCULAR | Status: DC

## 2020-11-23 MED ORDER — SALINE SPRAY 0.65 % NA SOLN
2.0000 | Freq: Two times a day (BID) | NASAL | Status: DC
  Administered 2020-11-23 – 2020-11-28 (×7): 2 via NASAL
  Filled 2020-11-23 (×3): qty 44

## 2020-11-23 MED ORDER — FUROSEMIDE 10 MG/ML IJ SOLN
INTRAMUSCULAR | Status: AC
  Administered 2020-11-23: 40 mg via INTRAVENOUS
  Filled 2020-11-23: qty 4

## 2020-11-23 MED ORDER — AZELASTINE HCL 0.1 % NA SOLN
1.0000 | Freq: Two times a day (BID) | NASAL | Status: DC
  Administered 2020-11-23 – 2020-11-27 (×6): 1 via NASAL
  Filled 2020-11-23: qty 30

## 2020-11-23 MED ORDER — SALINE SPRAY 0.65 % NA SOLN
1.0000 | NASAL | Status: DC | PRN
  Administered 2020-11-23: 1 via NASAL
  Filled 2020-11-23 (×2): qty 44

## 2020-11-23 MED ORDER — LEVALBUTEROL HCL 1.25 MG/0.5ML IN NEBU
1.2500 mg | INHALATION_SOLUTION | Freq: Three times a day (TID) | RESPIRATORY_TRACT | Status: DC
  Administered 2020-11-24 (×2): 1.25 mg via RESPIRATORY_TRACT
  Filled 2020-11-23 (×2): qty 0.5

## 2020-11-23 MED ORDER — METHYLPREDNISOLONE SODIUM SUCC 40 MG IJ SOLR
40.0000 mg | Freq: Four times a day (QID) | INTRAMUSCULAR | Status: DC
  Administered 2020-11-23 – 2020-11-26 (×12): 40 mg via INTRAVENOUS
  Filled 2020-11-23 (×11): qty 1

## 2020-11-23 MED ORDER — FUROSEMIDE 10 MG/ML IJ SOLN: 40.0000 mg | Freq: Once | INTRAMUSCULAR | Status: AC

## 2020-11-23 MED ORDER — VIBEGRON 75 MG PO TABS
1.0000 | ORAL_TABLET | Freq: Every day | ORAL | Status: DC
  Administered 2020-11-25 – 2020-11-29 (×4): 1 via ORAL
  Filled 2020-11-23 (×2): qty 1

## 2020-11-23 MED ORDER — FLORANEX PO PACK
1.0000 g | PACK | Freq: Three times a day (TID) | ORAL | Status: DC
  Administered 2020-11-23 – 2020-11-29 (×12): 1 g via ORAL
  Filled 2020-11-23 (×23): qty 1

## 2020-11-23 MED ORDER — FUROSEMIDE 10 MG/ML IJ SOLN
40.0000 mg | Freq: Once | INTRAMUSCULAR | Status: AC
  Administered 2020-11-23: 40 mg via INTRAVENOUS
  Filled 2020-11-23: qty 4

## 2020-11-23 MED ORDER — SODIUM CHLORIDE 0.9 % IV SOLN
1.0000 g | INTRAVENOUS | Status: DC
  Administered 2020-11-23 – 2020-11-26 (×4): 1 g via INTRAVENOUS
  Filled 2020-11-23 (×4): qty 10

## 2020-11-23 NOTE — Progress Notes (Signed)
Patient condition changed during dinner. She had removed her mask and became slightly confused. Quickly reoriented after oxygen reapplied. Notified Dr. Roger Shelter. She tolerated an ensure well, placed back on venturi mask. O2 sats improved from 77%  to 89% slowly, with mentation remaining well and work of breathing about the same. She appears tired, but color is good, and demeanor well. Currently still on her cont. Pulse ox with family at bedside. Awaiting further orders.

## 2020-11-23 NOTE — Plan of Care (Signed)
  Problem: Education: Goal: Knowledge of General Education information will improve Description Including pain rating scale, medication(s)/side effects and non-pharmacologic comfort measures Outcome: Progressing   Problem: Health Behavior/Discharge Planning: Goal: Ability to manage health-related needs will improve Outcome: Progressing   

## 2020-11-23 NOTE — Plan of Care (Signed)

## 2020-11-23 NOTE — Progress Notes (Signed)
Patient's daughter had checked patient's pulse ox and was low on her monitor. Patient appeared in no distress, color well, no belly breathing, work of breathing unchanged. Checked patient's sat with our machine, was between 84-88%. At times dropped into the 70s over the course of standing by the bedside. Notified patient's pulmonary doctor of the events and flagged Dr. Jamesetta Geralds in the conversation. Notified him also of patient's nasal congestion, and also of concerns over aspiration. Respiratory notified and came to assess patient. Mouth breathing. Patient struggling to breathe through nose. Resp placed patient on Venturi mask to increase o2 above 88%. Sats of 93-94% post breathing treatment and change of o2 delivery. Patient other vitals as follows 136/70, map of 84, 57 hr, 26 for her resp currently. Daughter is at bedside. Placed patient on remote tele with cont pulse ox after notifying [place] charge nurse of previous orders.

## 2020-11-23 NOTE — Progress Notes (Signed)
PROGRESS NOTE    Patient: Monique Robles                            PCP: Celene Squibb, MD                    DOB: 1940-10-29            DOA: 11/20/2020 [id number]             DOS: 11/23/2020, 2:07 PM   LOS: 2 days   Date of Service: The patient was seen and examined on 11/23/2020  Subjective:   The patient was seen and examined this morning, laying in bed comfortable, but her O2 demand has increased up to 6 L of oxygen now satting greater around 99 %  Later today patient was noted by nursing staff to be satting 84-88%, patient placed on Ventimask, oral nasal care patient was repositioned O2 sat is improved to 93-94% with breathing treatment... Patient remained stable hemodynamically, no signs of hypoxia at this point   Brief Narrative:    Monique Robles is a 80 y.o. female with medical history significant for essential tremor, prior CVA, GERD, overactive bladder who presents to the emergency department by daughter due to low oxygen saturation at home today.  Shortness of breath over past several month continue getting worse.  Status post recent extensive cardiac work-up which has been negative, PCP has referred to pulmonologist for further evaluation.    Shortness of breath got worse over the weekend (9/9) whereby she appears to be in respiratory distress on minimal exertion and today, O2 sat was noted to be a 75% on room air with home pulse oximeter.  She complained of some productive cough but denies fever, chills, runny nose or nasal congestion.    Per patient had an extensive work-up 2 years ago in [place] including lung biopsy with no specific pathology.  In ED was satting 87% room air, CBC CMP within normal limits, influenza A, B, SARS-CoV-2 all negative chest x-ray revealing chronic interstitial lung disease   Assessment & Plan:   Principal Problem:   Acute respiratory failure with hypoxia (HCC) Active Problems:   Iron deficiency anemia due to chronic blood loss    Essential tremor   Cerebrovascular accident (CVA) (HCC)   Overactive bladder   GERD (gastroesophageal reflux disease)   Bronchitis   Obesity (BMI 30-39.9)   Insomnia   Acute respiratory failure (HCC)   Acute respiratory failure with hypoxia possibly secondary to acute bronchitis -Worsening respiratory distress, increasing O2 demand  -Currently on Ventimask, rate of 12 FiO2 of 50 satting 91% -We will obtain ABG -Lasix 40 mg IV x1 -DuoNeb breathing treatment  -CT of the chest was reviewed, chronic interstitial lung disease identified, negative for pulmonary embolism, possible multifocal pneumonia  -Chest x-ray-reviewed, showed chronic interstitial lung disease w/o evidence of acute or active cardiopulmonary disease -ABG:  -We will cont. mucolytic's, Mucinex, IV steroids, azithromycin, duo nebs -Due to above CT findings, adding IV Rocephin  -Continue Protonix to prevent steroid-induced ulcer -Continue incentive spirometry and flutter valve -Continue supplemental oxygen to maintain O2 sat > 92% with plan to wean patient off oxygen as tolerated - Pulmonology will be consulted and we shall await further recommendation  Essential tremor -continue propanolol -Stable  History of CVA -Stable, continue Eliquis, no statin was started in patient's med rec, we shall await updated med rec   GERD Continue  Protonix Stable   Overactive bladder Continue trospium and vibegron Stable   Insomnia Continue melatonin   Obesity (BMI 34.96) Patient will be counseled on diet and lifestyle modification when more stable   Chronic anemia -Iron studies revealed severe iron deficiency anemia -Abstaining from any iron supplements at this time due to infection Planning for supplement iron once patient stable from the infection standpoint -We will obtain iron studies, Hemoccult.    DVT prophylaxis: On Eliquis   Code Status: Full code (confirmed by patient)   Family Communication: No family  present this a.m. Daughter was at bedside on admission   Disposition Plan:  Patient is from:                        home Anticipated DC to:                   SNF or family members home with [facility]  Anticipated DC date:               2-3 days Anticipated DC barriers:          Patient requires inpatient management due to hypoxia secondary to possible acute bronchitis    Level of care: Med-Surg   Procedures:   No admission procedures for hospital encounter.    Antimicrobials:  Anti-infectives (From admission, onward)    Start     Dose/Rate Route Frequency Ordered Stop   11/23/20 0900  cefTRIAXone (ROCEPHIN) 1 g in sodium chloride 0.9 % 100 mL IVPB        1 g 200 mL/hr over 30 Minutes Intravenous Every 24 hours 11/23/20 0752     11/22/20 1000  azithromycin (ZITHROMAX) tablet 250 mg       See Hyperspace for full Linked Orders Report.   250 mg Oral Daily 11/21/20 0227 11/26/20 0959   11/21/20 1000  azithromycin (ZITHROMAX) tablet 500 mg       See Hyperspace for full Linked Orders Report.   500 mg Oral Daily 11/21/20 0227 11/21/20 1116   11/20/20 2230  cefTRIAXone (ROCEPHIN) 1 g in sodium chloride 0.9 % 100 mL IVPB        1 g 200 mL/hr over 30 Minutes Intravenous  Once 11/20/20 2219 11/21/20 0107        Medication:   apixaban  5 mg Oral BID   azithromycin  250 mg Oral Daily   budesonide (PULMICORT) nebulizer solution  0.25 mg Nebulization BID   dextromethorphan-guaiFENesin  1 tablet Oral BID   influenza vaccine adjuvanted  0.5 mL Intramuscular Tomorrow-1000   ipratropium  0.5 mg Nebulization Q8H   lactobacillus  1 g Oral TID WC   levalbuterol  1.25 mg Nebulization Q8H   melatonin  6 mg Oral QHS   methylPREDNISolone (SOLU-MEDROL) injection  40 mg Intravenous Q12H   pantoprazole  40 mg Oral Daily   propranolol  40 mg Oral BID   Vibegron  1 tablet Oral Daily    acetaminophen, albuterol, ondansetron   Objective:   Vitals:   11/23/20 0607 11/23/20 0717 11/23/20 1258  11/23/20 1343  BP: 136/70   134/75  Pulse: 67   (!) 59  Resp: 20   16  Temp: 97.8 F (36.6 C)   (!) 97.4 F (36.3 C)  TempSrc: Oral   Oral  SpO2: (!) 84% (!) 86% 96% 91%  Weight:      Height:        Intake/Output Summary (Last 24 hours)  at 11/23/2020 1407 Last data filed at 11/22/2020 1700 Gross per 24 hour  Intake 240 ml  Output 600 ml  Net -360 ml   Filed Weights   11/20/20 2015 11/21/20 2122  Weight: 83.9 kg 85.2 kg     Examination:       Physical Exam:   General:  Alert, oriented, cooperative, no distress;   HEENT:  Normocephalic, PERRL, otherwise with in Normal limits   Neuro:  CNII-XII intact. , normal motor and sensation, reflexes intact   Lungs:   Diffuse rhonchi, reduced breath sounds at lower lobes respirations unlabored, no wheezes /mild crackles at lower lobes  Cardio:    S1/S2, RRR, No murmure, No Rubs or Gallops   Abdomen:   Soft, non-tender, bowel sounds active all four quadrants,  no guarding or peritoneal signs.  Muscular skeletal:  Limited exam - in bed, able to move all 4 extremities, Normal strength,  2+ pulses,  symmetric, No pitting edema  Skin:  Dry, warm to touch, negative for any Rashes,  Wounds: Please see nursing documentation           -------------------------------------------------------------------------------------------------------------------------    LABs:  CBC Latest Ref Rng & Units 11/23/2020 11/21/2020 11/20/2020  WBC 4.0 - 10.5 K/uL 15.0(H) 7.8 8.4  Hemoglobin 12.0 - 15.0 g/dL 8.6(L) 9.6(L) 9.7(L)  Hematocrit 36.0 - 46.0 % 28.8(L) 32.3(L) 32.2(L)  Platelets 150 - 400 K/uL 363 322 341   CMP Latest Ref Rng & Units 11/23/2020 11/21/2020 11/20/2020  Glucose 70 - 99 mg/dL 121(H) 139(H) 132(H)  BUN 8 - 23 mg/dL 27(H) 17 20  Creatinine 0.44 - 1.00 mg/dL 0.60 0.59 0.79  Sodium 135 - 145 mmol/L 139 137 138  Potassium 3.5 - 5.1 mmol/L 4.3 4.0 3.7  Chloride 98 - 111 mmol/L 109 109 109  CO2 22 - 32 mmol/L 21(L) 20(L) 22  Calcium  8.9 - 10.3 mg/dL 8.4(L) 8.2(L) 8.4(L)  Total Protein 6.5 - 8.1 g/dL - 6.8 -  Total Bilirubin 0.3 - 1.2 mg/dL - 0.5 -  Alkaline Phos 38 - 126 U/L - 85 -  AST 15 - 41 U/L - 18 -  ALT 0 - 44 U/L - 9 -       Micro Results Recent Results (from the past 240 hour(s))  Resp Panel by RT-PCR (Flu A&B, Covid) Nasopharyngeal Swab     Status: None   Collection Time: 11/20/20  9:56 PM   Specimen: Nasopharyngeal Swab; Nasopharyngeal(NP) swabs in vial transport medium  Result Value Ref Range Status   SARS Coronavirus 2 by RT PCR NEGATIVE NEGATIVE Final    Comment: (NOTE) SARS-CoV-2 target nucleic acids are NOT DETECTED.  The SARS-CoV-2 RNA is generally detectable in upper respiratory specimens during the acute phase of infection. The lowest concentration of SARS-CoV-2 viral copies this assay can detect is 138 copies/mL. A negative result does not preclude SARS-Cov-2 infection and should not be used as the sole basis for treatment or other patient management decisions. A negative result may occur with  improper specimen collection/handling, submission of specimen other than nasopharyngeal swab, presence of viral mutation(s) within the areas targeted by this assay, and inadequate number of viral copies(<138 copies/mL). A negative result must be combined with clinical observations, patient history, and epidemiological information. The expected result is Negative.  Fact Sheet for Patients:  EntrepreneurPulse.com.au  Fact Sheet for Healthcare Providers:  IncredibleEmployment.be  This test is no t yet approved or cleared by the [place] and  has been authorized for  detection and/or diagnosis of SARS-CoV-2 by FDA under an Emergency Use Authorization (EUA). This EUA will remain  in effect (meaning this test can be used) for the duration of the COVID-19 declaration under Section 564(b)(1) of the Act, 21 U.S.C.section 360bbb-3(b)(1), unless the  authorization is terminated  or revoked sooner.       Influenza A by PCR NEGATIVE NEGATIVE Final   Influenza B by PCR NEGATIVE NEGATIVE Final    Comment: (NOTE) The Xpert Xpress SARS-CoV-2/FLU/RSV plus assay is intended as an aid in the diagnosis of influenza from Nasopharyngeal swab specimens and should not be used as a sole basis for treatment. Nasal washings and aspirates are unacceptable for Xpert Xpress SARS-CoV-2/FLU/RSV testing.  Fact Sheet for Patients: EntrepreneurPulse.com.au  Fact Sheet for Healthcare Providers: IncredibleEmployment.be  This test is not yet approved or cleared by the [place] FDA and has been authorized for detection and/or diagnosis of SARS-CoV-2 by FDA under an Emergency Use Authorization (EUA). This EUA will remain in effect (meaning this test can be used) for the duration of the COVID-19 declaration under Section 564(b)(1) of the Act, 21 U.S.C. section 360bbb-3(b)(1), unless the authorization is terminated or revoked.  Performed at [facility], [address], [place], [place] [address]     Radiology Reports DG Chest 2 View  Result Date: 11/20/2020 CLINICAL DATA:  Worsening shortness of breath. EXAM: CHEST - 2 VIEW COMPARISON:  Jul 31, 2020 FINDINGS: Stable, mild to moderate severity diffuse chronic appearing increased interstitial lung markings are seen. There is no evidence of a pleural effusion or pneumothorax. The heart size and mediastinal contours are within normal limits. There is mild calcification of the aortic arch. A chronic sixth right rib deformity is seen. Degenerative changes are noted throughout the thoracic spine. IMPRESSION: Chronic interstitial lung disease without evidence of acute or active cardiopulmonary disease. Electronically Signed   By: Virgina Norfolk M.D.   On: 11/20/2020 21:09   CT Angio Chest Pulmonary Embolism (PE) W or WO Contrast  Result Date: 11/22/2020 CLINICAL DATA:   Chest pain shortness of breath, pleurisy or effusion suspected. EXAM: CT ANGIOGRAPHY CHEST WITH CONTRAST TECHNIQUE: Multidetector CT imaging of the chest was performed using the standard protocol during bolus administration of intravenous contrast. Multiplanar CT image reconstructions and MIPs were obtained to evaluate the vascular anatomy. CONTRAST:  23m OMNIPAQUE IOHEXOL 350 MG/ML SOLN COMPARISON:  Chest radiograph 11/20/2020 FINDINGS: Cardiovascular: Satisfactory opacification of the pulmonary arteries without evidence of pulmonary embolism. Heart size is upper limits of normal. Left atrium is dilated measuring 5.1 cm in the AP dimension. No significant pericardial effusion. Evidence for coronary artery calcifications. Mediastinum/Nodes: Enlarged prevascular lymph node measuring 1.2 cm in the short axis on sequence 4, image 31. Prominent right hilar lymph node measuring 1.0 cm in the short axis on sequence 4, image 47. Prevascular lymph node measuring 1.2 cm in the short axis on sequence 4, image 27. Difficult to exclude wall thickening in the mid/distal esophagus. Proximal and mid esophagus is distended. Lungs/Pleura: Trachea and mainstem bronchi are patent. Diffuse patchy bilateral airspace disease in both lungs and involving all of the lobes. Calcified nodule along the right minor fissure on sequence 6, image 64 that measures 5 mm. Trace bilateral pleural fluid versus pleural thickening. Upper Abdomen: No acute abnormality in the upper abdomen. Musculoskeletal: No acute bone abnormality. Review of the MIP images confirms the above findings. IMPRESSION: 1. Multifocal pneumonia. Bilateral patchy airspace disease in both lungs. 2. Negative for a pulmonary embolism. 3.  Mild mediastinal and right hilar lymphadenopathy. Findings are nonspecific but could be reactive. Consider surveillance. 4. Mild dilatation of the esophagus and questionable mild thickening in the mid/distal esophagus. Findings are nonspecific. 5.  Coronary artery calcifications. Electronically Signed   By: Markus Daft M.D.   On: 11/22/2020 17:19    SIGNED: Deatra James, MD, FHM. [facility],  Pager (please use [email or web address] to page/text) Please use Epic Secure Chat for non-urgent communication (7AM-7PM)  If 7PM-7AM, please contact night-coverage [email or web address], 11/23/2020, 2:07 PM

## 2020-11-23 NOTE — Progress Notes (Signed)
Dr. Roger Shelter stated to give patient '40mg'$  IV lasix now for patient's work of breathing. Given IV, doubled checked and verified. Non amended MAR duplicate. Waiting for solumedrol.

## 2020-11-23 NOTE — Consult Note (Signed)
[facility]   Patient name: Monique Robles Admit date: 11/20/2020  DOB: [birth year] LOS: 2  MRN: [id number] Consult date: 11/22/2020  Referring provider: Dr. Roger Shelter, [facility]: Short of breath    History:  80 yo female with history of recurrent UTIs on chronic nitrofurantoin therapy since January 2022 started developed dyspnea from April 2022.  She had chest xray in May 2022 that showed interstitial thickening.  She had cardiac assessment was unrevealing.  She was to have outpt pulmonary assessment, but then her breathing symptoms go to the point that she had to come to the hospital.  She presented to [facility] ER with dyspnea and intermittent headache for several day, and this was associated with leg swelling.  SpO2 85% on room air.  Was using albuterol nebulizer more frequently.  Having productive cough.  She moved from [place] to [place] about 2 years ago to live with her daughter after she had a stroke and her husband passed away.  She had pulmonary assessment there which included lung biopsy in the early 2000's for pneumonia.  She doesn't recall what the diagnosis was or what therapy she had.  She had pneumonia again a few years ago and was told she should use nebulizer medication.  She does not have history of smoking and was never told she has asthma.  She was not using supplemental oxygen prior to this admission.  She had recent cardiology assessment that was unrevealing.  Her CT chest report from 2018 was unremarkable.  Past medical history:  A fib, Diastolic CHF, GERD, PE, Pneumonia, CVA, HTN, MVR, Osteoporosis, Tremor, Vit D deficiency, Overactive bladder  Significant events:  9/13 Admit 9/14 start solumedrol 9/16 needing to use ventimask  Studies:  CT chest 02/23/17 >> post operative changes Rt lung, 5 mm nodule RUL Echo 09/14/20 >> EF 65 to 70%, grade 1 DD, mild LA dilation, mild MR, mild AS CT angio chest 11/22/20 >>  prominent LN, diffuse patchy b/l GGO air space disease, mild esophageal dilation  Micro:  COVID/Flu 9/13 >> negative  Lines:     Antibiotics:  Rocephin 9/13 Zithromax 9/14 >>  Rocephin 9/16 >>   Consults:      Interim history:  Needing increased O2.  C/o sinus congestion.  Has coughing spells with swallowing.  Breathing okay at rest, but gets winded with minimal activity.  Vital signs:  BP 134/75 (BP Location: Left Arm)   Pulse (!) 59   Temp (!) 97.4 F (36.3 C) (Oral)   Resp 16   Ht _0  (1.549 m)   Wt 85.2 kg   SpO2 91%   BMI 35.49 kg/m   Intake/output:  I/O last 3 completed shifts: In: 0737 [P.O.:720; I.V.:1000] Out: 600 [Urine:600]   Physical exam:   General - alert Eyes - pupils reactive ENT - no sinus tenderness, no stridor Cardiac - regular rate/rhythm, no murmur Chest - b/l crackles Abdomen - soft, non tender, + bowel sounds Extremities - no cyanosis, clubbing, or edema Skin - no rashes Neuro - normal strength, moves extremities, follows commands Psych - normal mood and behavior      Best practice:   DVT - eliquis SUP - protonix Nutrition - heart healthy Mobility - as tolerated   Assessment/plan:   Acute hypoxic respiratory failure with concern for nitrofurantoin pulmonary toxicity. - would not give her nitrofurantoin again - labs from 9/16 >> ESR 45, BNP 634 - continue solumedrol - f/u CXR on 11/26/20 - f/u  ANA, ANCA from 11/23/20 - adjust oxygen to keep SpO2 > 90% - if respiratory status gets worse, then might need to transfer to [facility] and consider bronchoscopy versus VATs biopsy - day 4 of ABx >> would give ABx for 7 days, but clinical suspicion for bacterial infection is lower - negative fluid balance as tolerated  Dysphagia. - will ask speech therapy to assess swallowing  Rhinitis. - will add azelastine, nasal irrigation  Hx of A fib, PE. - she is on eliquis  D/w Dr. Roger Shelter  Resolved hospital problems:    Goals of  care/Family discussions:  Code status: full code  Updated her daughter at bedside.  Labs:   CMP Latest Ref Rng & Units 11/23/2020 11/21/2020 11/20/2020  Glucose 70 - 99 mg/dL 121(H) 139(H) 132(H)  BUN 8 - 23 mg/dL 27(H) 17 20  Creatinine 0.44 - 1.00 mg/dL 0.60 0.59 0.79  Sodium 135 - 145 mmol/L 139 137 138  Potassium 3.5 - 5.1 mmol/L 4.3 4.0 3.7  Chloride 98 - 111 mmol/L 109 109 109  CO2 22 - 32 mmol/L 21(L) 20(L) 22  Calcium 8.9 - 10.3 mg/dL 8.4(L) 8.2(L) 8.4(L)  Total Protein 6.5 - 8.1 g/dL - 6.8 -  Total Bilirubin 0.3 - 1.2 mg/dL - 0.5 -  Alkaline Phos 38 - 126 U/L - 85 -  AST 15 - 41 U/L - 18 -  ALT 0 - 44 U/L - 9 -    CBC Latest Ref Rng & Units 11/23/2020 11/21/2020 11/20/2020  WBC 4.0 - 10.5 K/uL 15.0(H) 7.8 8.4  Hemoglobin 12.0 - 15.0 g/dL 8.6(L) 9.6(L) 9.7(L)  Hematocrit 36.0 - 46.0 % 28.8(L) 32.3(L) 32.2(L)  Platelets 150 - 400 K/uL 363 322 341    ABG    Component Value Date/Time   PHART 7.408 11/23/2020 0945   PCO2ART 33.4 11/23/2020 0945   PO2ART 53.5 (L) 11/23/2020 0945   HCO3 21.7 11/23/2020 0945   ACIDBASEDEF 3.2 (H) 11/23/2020 0945   O2SAT 86.7 11/23/2020 0945    Signature:  Chesley Mires, MD [place] Pager - [phone] 11/23/2020, 3:21 PM

## 2020-11-24 ENCOUNTER — Inpatient Hospital Stay (HOSPITAL_COMMUNITY): Payer: Medicare Other

## 2020-11-24 DIAGNOSIS — J9601 Acute respiratory failure with hypoxia: Secondary | ICD-10-CM | POA: Diagnosis not present

## 2020-11-24 LAB — BASIC METABOLIC PANEL
Anion gap: 7 (ref 5–15)
BUN: 39 mg/dL — ABNORMAL HIGH (ref 8–23)
CO2: 26 mmol/L (ref 22–32)
Calcium: 7.8 mg/dL — ABNORMAL LOW (ref 8.9–10.3)
Chloride: 103 mmol/L (ref 98–111)
Creatinine, Ser: 0.81 mg/dL (ref 0.44–1.00)
GFR, Estimated: >60 mL/min (ref >60)
Glucose, Bld: 178 mg/dL — ABNORMAL HIGH (ref 70–99)
Potassium: 3.8 mmol/L (ref 3.5–5.1)
Sodium: 136 mmol/L (ref 135–145)

## 2020-11-24 LAB — BLOOD GAS, ARTERIAL
Acid-Base Excess: 1.4 mmol/L (ref 0.0–2.0)
Bicarbonate: 25.7 mmol/L (ref 20.0–28.0)
FIO2: 100
O2 Saturation: 88.1 %
Patient temperature: 37
pCO2 arterial: 36.3 mmHg (ref 32.0–48.0)
pH, Arterial: 7.452 — ABNORMAL HIGH (ref 7.350–7.450)
pO2, Arterial: 57.6 mmHg — ABNORMAL LOW (ref 83.0–108.0)

## 2020-11-24 LAB — RHEUMATOID FACTOR: Rheumatoid fact SerPl-aCnc: <10 IU/mL (ref <14.0)

## 2020-11-24 LAB — CBC
HCT: 29.2 % — ABNORMAL LOW (ref 36.0–46.0)
Hemoglobin: 8.8 g/dL — ABNORMAL LOW (ref 12.0–15.0)
MCH: 27.7 pg (ref 26.0–34.0)
MCHC: 30.1 g/dL (ref 30.0–36.0)
MCV: 91.8 fL (ref 80.0–100.0)
Platelets: 365 10*3/uL (ref 150–400)
RBC: 3.18 MIL/uL — ABNORMAL LOW (ref 3.87–5.11)
RDW: 14.7 % (ref 11.5–15.5)
WBC: 11.2 10*3/uL — ABNORMAL HIGH (ref 4.0–10.5)
nRBC: 0.4 % — ABNORMAL HIGH (ref 0.0–0.2)

## 2020-11-24 LAB — MAGNESIUM: Magnesium: 2.3 mg/dL (ref 1.7–2.4)

## 2020-11-24 LAB — BRAIN NATRIURETIC PEPTIDE: B Natriuretic Peptide: 327 pg/mL — ABNORMAL HIGH (ref 0.0–100.0)

## 2020-11-24 LAB — ANA W/REFLEX IF POSITIVE: Anti Nuclear Antibody (ANA): NEGATIVE

## 2020-11-24 LAB — MRSA NEXT GEN BY PCR, NASAL: MRSA by PCR Next Gen: NOT DETECTED

## 2020-11-24 IMAGING — DX DG CHEST 1V PORT
1 series · 1 of 1 positions shown · non-contrast
Comparison: [DATE]

CLINICAL DATA: 72-year-old female with acute shortness of breath.

EXAM:
PORTABLE CHEST 1 VIEW

[chest ap]
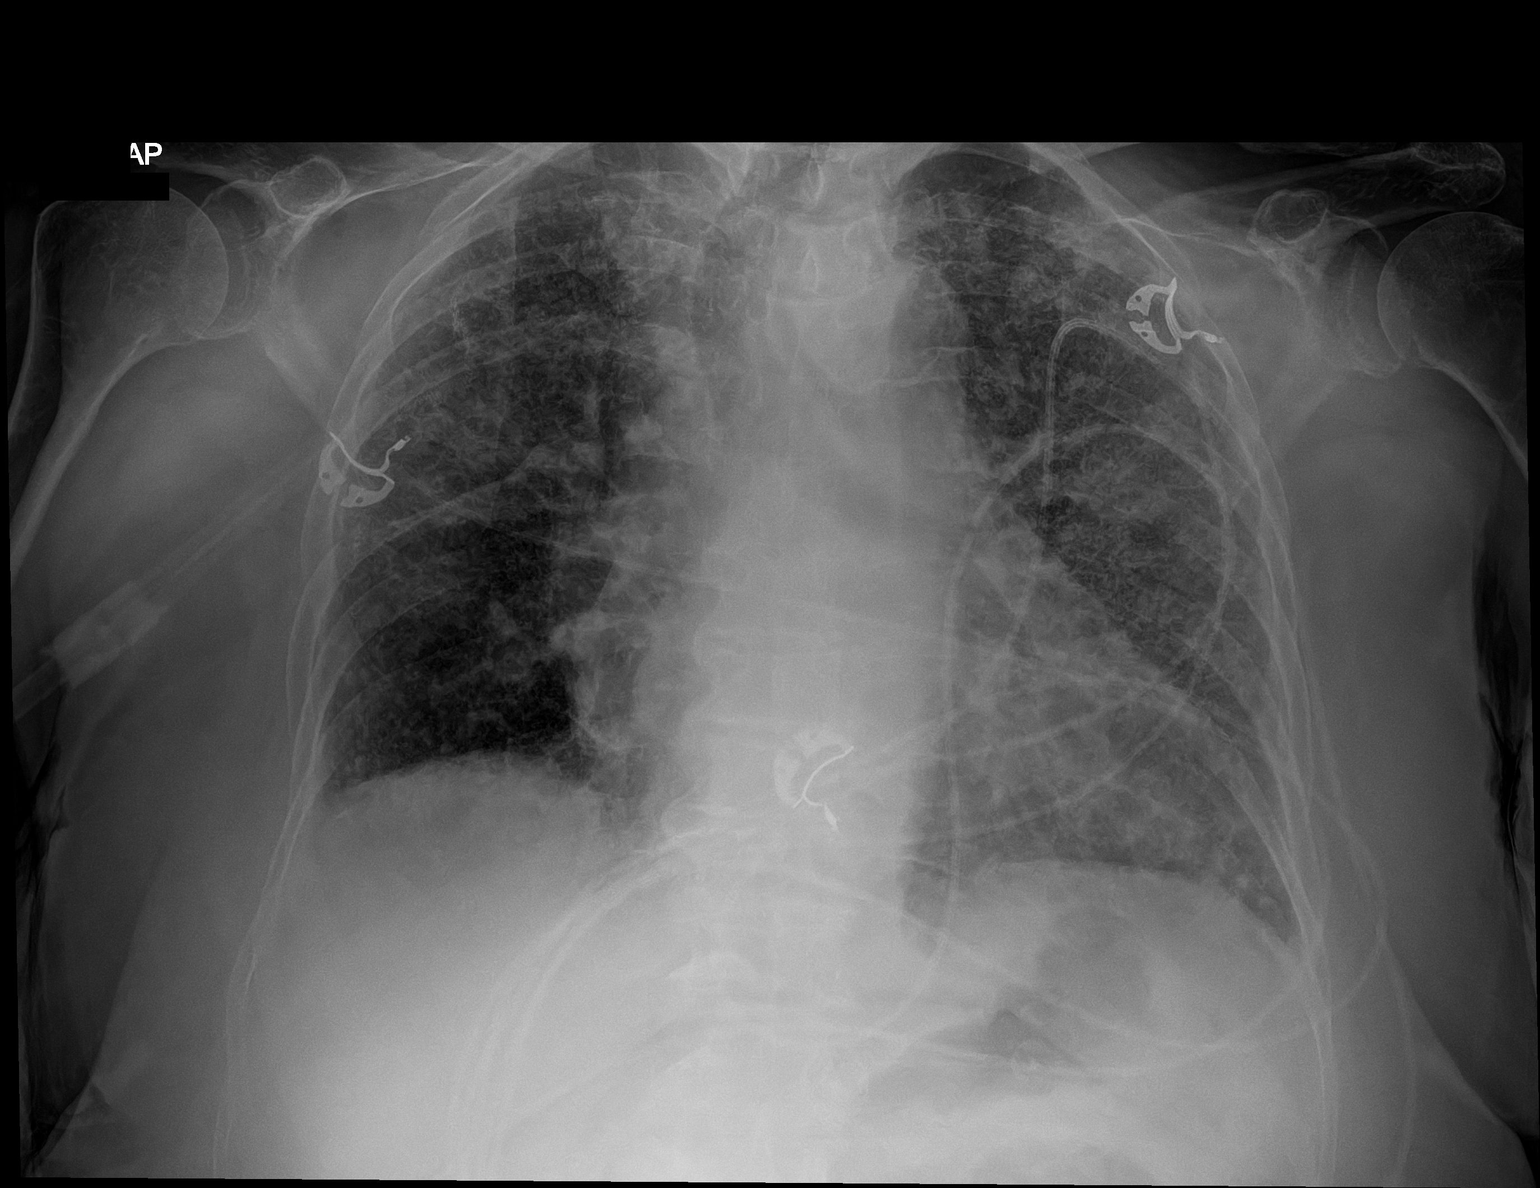

[1 of 1 positions shown; findings below may reference images not displayed]

FINDINGS: UPPER limits normal heart size again noted.

Increased interstitial opacities are identified without focal
airspace disease, pleural effusion, pneumothorax or pulmonary mass.

No acute bony abnormalities are noted.
IMPRESSION: Increased interstitial opacities, question interstitial edema or
infection.

## 2020-11-24 MED ORDER — LEVALBUTEROL HCL 1.25 MG/0.5ML IN NEBU
1.2500 mg | INHALATION_SOLUTION | Freq: Three times a day (TID) | RESPIRATORY_TRACT | Status: DC
  Administered 2020-11-24 – 2020-11-27 (×8): 1.25 mg via RESPIRATORY_TRACT
  Filled 2020-11-24 (×8): qty 0.5

## 2020-11-24 MED ORDER — METOPROLOL TARTRATE 5 MG/5ML IV SOLN
5.0000 mg | INTRAVENOUS | Status: DC | PRN
  Administered 2020-11-24: 5 mg via INTRAVENOUS
  Filled 2020-11-24: qty 5

## 2020-11-24 MED ORDER — METOPROLOL TARTRATE 5 MG/5ML IV SOLN
INTRAVENOUS | Status: AC
  Administered 2020-11-24: 5 mg via INTRAVENOUS
  Filled 2020-11-24: qty 5

## 2020-11-24 MED ORDER — FUROSEMIDE 10 MG/ML IJ SOLN
40.0000 mg | Freq: Once | INTRAMUSCULAR | Status: AC
  Administered 2020-11-24: 40 mg via INTRAVENOUS
  Filled 2020-11-24: qty 4

## 2020-11-24 MED ORDER — CHLORHEXIDINE GLUCONATE CLOTH 2 % EX PADS
6.0000 | MEDICATED_PAD | Freq: Every day | CUTANEOUS | Status: DC
  Administered 2020-11-24 – 2020-11-29 (×6): 6 via TOPICAL

## 2020-11-24 MED ORDER — DILTIAZEM HCL-DEXTROSE 125-5 MG/125ML-% IV SOLN (PREMIX)
5.0000 mg/h | INTRAVENOUS | Status: DC
  Administered 2020-11-24 – 2020-11-25 (×2): 5 mg/h via INTRAVENOUS
  Filled 2020-11-24 (×2): qty 125

## 2020-11-24 MED ORDER — IPRATROPIUM BROMIDE 0.02 % IN SOLN
0.5000 mg | Freq: Three times a day (TID) | RESPIRATORY_TRACT | Status: DC
  Administered 2020-11-24 – 2020-11-27 (×8): 0.5 mg via RESPIRATORY_TRACT
  Filled 2020-11-24 (×8): qty 2.5

## 2020-11-24 MED ORDER — IPRATROPIUM BROMIDE 0.02 % IN SOLN
0.5000 mg | Freq: Three times a day (TID) | RESPIRATORY_TRACT | Status: DC
  Administered 2020-11-24 (×2): 0.5 mg via RESPIRATORY_TRACT
  Filled 2020-11-24 (×2): qty 2.5

## 2020-11-24 NOTE — Progress Notes (Signed)
PROGRESS NOTE    Patient: Monique Robles                            PCP: Celene Squibb, MD                    DOB: May 22, 1940            DOA: 11/20/2020 [id number]             DOS: 11/24/2020, 12:01 PM   LOS: 3 days   Date of Service: The patient was seen and examined on 11/24/2020  Subjective:   The patient was seen and examined this morning, awake alert, cooperative, in mild-moderate respite distress, with Ventimask, was recorded satting 86-92%  Nursing staff reported on the floor that she was satting as low as 69%, then improved to 80% with adjustment.  She remains awake alert... Denies any chest pain   Brief Narrative:    Monique Robles is a 80 y.o. female with medical history significant for essential tremor, prior CVA, GERD, overactive bladder who presents to the emergency department by daughter due to low oxygen saturation at home today.  Shortness of breath over past several month continue getting worse.  Status post recent extensive cardiac work-up which has been negative, PCP has referred to pulmonologist for further evaluation.    Shortness of breath got worse over the weekend (9/9) whereby she appears to be in respiratory distress on minimal exertion and today, O2 sat was noted to be a 75% on room air with home pulse oximeter.  She complained of some productive cough but denies fever, chills, runny nose or nasal congestion.    Per patient had an extensive work-up 2 years ago in [place] including lung biopsy with no specific pathology.  In ED was satting 87% room air, CBC CMP within normal limits, influenza A, B, SARS-CoV-2 all negative chest x-ray revealing chronic interstitial lung disease   Assessment & Plan:   Principal Problem:   Acute respiratory failure with hypoxia (HCC) Active Problems:   Iron deficiency anemia due to chronic blood loss   Essential tremor   Cerebrovascular accident (CVA) (HCC)   Overactive bladder   GERD (gastroesophageal reflux disease)    Bronchitis   Obesity (BMI 30-39.9)   Insomnia   Acute respiratory failure (HCC)   Acute respiratory failure with hypoxia possibly secondary to acute bronchitis -Worsening respite distress, currently on Ventimask flow of 10, FiO2 of 55 -ABG 7.45, PCO2 36.3, PO2 57.6, bicarb of 25.7 - We will continue with IV Lasix, to dose 40 mg was given yesterday, another dose will be given today -Repeat chest x-ray this morning reporting increased opacity, -DuoNeb breathing treatment, Pulmicort, Xopenex scheduled treatment -Increased IV steroids  -CT of the chest was reviewed, chronic interstitial lung disease identified, negative for pulmonary embolism, possible multifocal pneumonia   -We will cont. mucolytic's, Mucinex, IV steroids, azithromycin, duo nebs -We will continue antibiotics Rocephin and azithromycin  -Continue Protonix to prevent steroid-induced ulcer -Continue incentive spirometry and flutter valve  - Pulmonology Dr. Halford Chessman has seen evaluated patient, reviewed all medical records including CT of the chest..  Patient seems to be developing acute on chronic interstitial lung disease Possible drug toxicity-nitrofurantoin  -We will transfer the patient to ICU, continue RT evaluation, evaluating for possible BiPAP if she does not maintain her O2 sat greater than 92%..    Essential tremor -continue propanolol -Stable  History of  CVA -Stable, continue Eliquis,  -Not on any statins   GERD Continue Protonix Stable   Overactive bladder Continue trospium and vibegron Stable   Insomnia Continue melatonin   Obesity (BMI 34.96) Patient will be counseled on diet and lifestyle modification when more stable   Chronic anemia -iron deficiency anemia -Iron studies revealed severe iron deficiency anemia -Holding iron supplements at this time due to infection Planning for supplement iron once patient stable from the infection standpoint -We will obtain iron studies,  Hemoccult. -Monitoring H&H, currently stable   DVT prophylaxis: On Eliquis   Code Status: Full code (confirmed by patient)   Family Communication: No family present this a.m. Daughter was at bedside on admission   Disposition Plan:  Patient is from:                        home Anticipated DC to:                   SNF or family members home with [facility]  Anticipated DC date:               2-3 days Anticipated DC barriers:          Patient requires inpatient management due to hypoxia secondary to possible acute bronchitis    Level of care: ICU   Procedures:   No admission procedures for hospital encounter.    Antimicrobials:  Anti-infectives (From admission, onward)    Start     Dose/Rate Route Frequency Ordered Stop   11/23/20 0900  cefTRIAXone (ROCEPHIN) 1 g in sodium chloride 0.9 % 100 mL IVPB        1 g 200 mL/hr over 30 Minutes Intravenous Every 24 hours 11/23/20 0752     11/22/20 1000  azithromycin (ZITHROMAX) tablet 250 mg       See Hyperspace for full Linked Orders Report.   250 mg Oral Daily 11/21/20 0227 11/26/20 0959   11/21/20 1000  azithromycin (ZITHROMAX) tablet 500 mg       See Hyperspace for full Linked Orders Report.   500 mg Oral Daily 11/21/20 0227 11/21/20 1116   11/20/20 2230  cefTRIAXone (ROCEPHIN) 1 g in sodium chloride 0.9 % 100 mL IVPB        1 g 200 mL/hr over 30 Minutes Intravenous  Once 11/20/20 2219 11/21/20 0107        Medication:   apixaban  5 mg Oral BID   azelastine  1 spray Each Nare BID   azithromycin  250 mg Oral Daily   budesonide (PULMICORT) nebulizer solution  0.25 mg Nebulization BID   Chlorhexidine Gluconate Cloth  6 each Topical Daily   dextromethorphan-guaiFENesin  1 tablet Oral BID   furosemide  40 mg Intravenous Once   influenza vaccine adjuvanted  0.5 mL Intramuscular Tomorrow-1000   ipratropium  0.5 mg Nebulization Q8H   lactobacillus  1 g Oral TID WC   levalbuterol  1.25 mg Nebulization Q8H   melatonin  6 mg Oral QHS    methylPREDNISolone (SOLU-MEDROL) injection  40 mg Intravenous Q6H   pantoprazole  40 mg Oral Daily   propranolol  40 mg Oral BID   sodium chloride  2 spray Each Nare BID   Vibegron  1 tablet Oral Daily    acetaminophen, albuterol, ondansetron, sodium chloride   Objective:   Vitals:   11/24/20 1000 11/24/20 1106 11/24/20 1117 11/24/20 1118  BP: (!) 114/41 (!) 142/68    Pulse:  70 70  68  Resp: (!) 28 (!) 25  19  Temp:   97.7 F (36.5 C)   TempSrc:   Axillary   SpO2: 100%   92%  Weight:      Height:        Intake/Output Summary (Last 24 hours) at 11/24/2020 1201 Last data filed at 11/23/2020 1800 Gross per 24 hour  Intake 101.17 ml  Output 1600 ml  Net -1498.83 ml   Filed Weights   11/20/20 2015 11/21/20 2122 11/24/20 0927  Weight: 83.9 kg 85.2 kg 84.6 kg     Examination:       Physical Exam:   General:  Alert, oriented, cooperative, in respite distress on Ventimask  HEENT:  Normocephalic, PERRL, otherwise with in Normal limits   Neuro:  CNII-XII intact. , normal motor and sensation, reflexes intact   Lungs:   Visible shortness of breath, labored breathing, on Ventimask diffuse Rales mild rhonchi's, mild crackles at lower lobes  Cardio:    S1/S2, RRR, No murmure, No Rubs or Gallops   Abdomen:   Soft, non-tender, bowel sounds active all four quadrants,  no guarding or peritoneal signs.  Muscular skeletal:  Limited exam - in bed, able to move all 4 extremities, Normal strength,  2+ pulses,  symmetric, No pitting edema  Skin:  Dry, warm to touch, negative for any Rashes,  Wounds: Please see nursing documentation              -------------------------------------------------------------------------------------------------------------------------    LABs:  CBC Latest Ref Rng & Units 11/24/2020 11/23/2020 11/21/2020  WBC 4.0 - 10.5 K/uL 11.2(H) 15.0(H) 7.8  Hemoglobin 12.0 - 15.0 g/dL 8.8(L) 8.6(L) 9.6(L)  Hematocrit 36.0 - 46.0 % 29.2(L) 28.8(L) 32.3(L)   Platelets 150 - 400 K/uL 365 363 322   CMP Latest Ref Rng & Units 11/23/2020 11/21/2020 11/20/2020  Glucose 70 - 99 mg/dL 121(H) 139(H) 132(H)  BUN 8 - 23 mg/dL 27(H) 17 20  Creatinine 0.44 - 1.00 mg/dL 0.60 0.59 0.79  Sodium 135 - 145 mmol/L 139 137 138  Potassium 3.5 - 5.1 mmol/L 4.3 4.0 3.7  Chloride 98 - 111 mmol/L 109 109 109  CO2 22 - 32 mmol/L 21(L) 20(L) 22  Calcium 8.9 - 10.3 mg/dL 8.4(L) 8.2(L) 8.4(L)  Total Protein 6.5 - 8.1 g/dL - 6.8 -  Total Bilirubin 0.3 - 1.2 mg/dL - 0.5 -  Alkaline Phos 38 - 126 U/L - 85 -  AST 15 - 41 U/L - 18 -  ALT 0 - 44 U/L - 9 -       Micro Results Recent Results (from the past 240 hour(s))  Resp Panel by RT-PCR (Flu A&B, Covid) Nasopharyngeal Swab     Status: None   Collection Time: 11/20/20  9:56 PM   Specimen: Nasopharyngeal Swab; Nasopharyngeal(NP) swabs in vial transport medium  Result Value Ref Range Status   SARS Coronavirus 2 by RT PCR NEGATIVE NEGATIVE Final    Comment: (NOTE) SARS-CoV-2 target nucleic acids are NOT DETECTED.  The SARS-CoV-2 RNA is generally detectable in upper respiratory specimens during the acute phase of infection. The lowest concentration of SARS-CoV-2 viral copies this assay can detect is 138 copies/mL. A negative result does not preclude SARS-Cov-2 infection and should not be used as the sole basis for treatment or other patient management decisions. A negative result may occur with  improper specimen collection/handling, submission of specimen other than nasopharyngeal swab, presence of viral mutation(s) within the areas targeted by this assay,  and inadequate number of viral copies(<138 copies/mL). A negative result must be combined with clinical observations, patient history, and epidemiological information. The expected result is Negative.  Fact Sheet for Patients:  EntrepreneurPulse.com.au  Fact Sheet for Healthcare Providers:  IncredibleEmployment.be  This  test is no t yet approved or cleared by the [place] FDA and  has been authorized for detection and/or diagnosis of SARS-CoV-2 by FDA under an Emergency Use Authorization (EUA). This EUA will remain  in effect (meaning this test can be used) for the duration of the COVID-19 declaration under Section 564(b)(1) of the Act, 21 U.S.C.section 360bbb-3(b)(1), unless the authorization is terminated  or revoked sooner.       Influenza A by PCR NEGATIVE NEGATIVE Final   Influenza B by PCR NEGATIVE NEGATIVE Final    Comment: (NOTE) The Xpert Xpress SARS-CoV-2/FLU/RSV plus assay is intended as an aid in the diagnosis of influenza from Nasopharyngeal swab specimens and should not be used as a sole basis for treatment. Nasal washings and aspirates are unacceptable for Xpert Xpress SARS-CoV-2/FLU/RSV testing.  Fact Sheet for Patients: EntrepreneurPulse.com.au  Fact Sheet for Healthcare Providers: IncredibleEmployment.be  This test is not yet approved or cleared by the [place] FDA and has been authorized for detection and/or diagnosis of SARS-CoV-2 by FDA under an Emergency Use Authorization (EUA). This EUA will remain in effect (meaning this test can be used) for the duration of the COVID-19 declaration under Section 564(b)(1) of the Act, 21 U.S.C. section 360bbb-3(b)(1), unless the authorization is terminated or revoked.  Performed at [facility], [address], [place], [place] [address]     Radiology Reports DG Chest 2 View  Result Date: 11/20/2020 CLINICAL DATA:  Worsening shortness of breath. EXAM: CHEST - 2 VIEW COMPARISON:  Jul 31, 2020 FINDINGS: Stable, mild to moderate severity diffuse chronic appearing increased interstitial lung markings are seen. There is no evidence of a pleural effusion or pneumothorax. The heart size and mediastinal contours are within normal limits. There is mild calcification of the aortic arch. A chronic sixth  right rib deformity is seen. Degenerative changes are noted throughout the thoracic spine. IMPRESSION: Chronic interstitial lung disease without evidence of acute or active cardiopulmonary disease. Electronically Signed   By: Virgina Norfolk M.D.   On: 11/20/2020 21:09   CT Angio Chest Pulmonary Embolism (PE) W or WO Contrast  Result Date: 11/22/2020 CLINICAL DATA:  Chest pain shortness of breath, pleurisy or effusion suspected. EXAM: CT ANGIOGRAPHY CHEST WITH CONTRAST TECHNIQUE: Multidetector CT imaging of the chest was performed using the standard protocol during bolus administration of intravenous contrast. Multiplanar CT image reconstructions and MIPs were obtained to evaluate the vascular anatomy. CONTRAST:  57m OMNIPAQUE IOHEXOL 350 MG/ML SOLN COMPARISON:  Chest radiograph 11/20/2020 FINDINGS: Cardiovascular: Satisfactory opacification of the pulmonary arteries without evidence of pulmonary embolism. Heart size is upper limits of normal. Left atrium is dilated measuring 5.1 cm in the AP dimension. No significant pericardial effusion. Evidence for coronary artery calcifications. Mediastinum/Nodes: Enlarged prevascular lymph node measuring 1.2 cm in the short axis on sequence 4, image 31. Prominent right hilar lymph node measuring 1.0 cm in the short axis on sequence 4, image 47. Prevascular lymph node measuring 1.2 cm in the short axis on sequence 4, image 27. Difficult to exclude wall thickening in the mid/distal esophagus. Proximal and mid esophagus is distended. Lungs/Pleura: Trachea and mainstem bronchi are patent. Diffuse patchy bilateral airspace disease in both lungs and involving all of the lobes. Calcified nodule  along the right minor fissure on sequence 6, image 64 that measures 5 mm. Trace bilateral pleural fluid versus pleural thickening. Upper Abdomen: No acute abnormality in the upper abdomen. Musculoskeletal: No acute bone abnormality. Review of the MIP images confirms the above findings.  IMPRESSION: 1. Multifocal pneumonia. Bilateral patchy airspace disease in both lungs. 2. Negative for a pulmonary embolism. 3. Mild mediastinal and right hilar lymphadenopathy. Findings are nonspecific but could be reactive. Consider surveillance. 4. Mild dilatation of the esophagus and questionable mild thickening in the mid/distal esophagus. Findings are nonspecific. 5. Coronary artery calcifications. Electronically Signed   By: Markus Daft M.D.   On: 11/22/2020 17:19   DG CHEST PORT 1 VIEW  Result Date: 11/24/2020 CLINICAL DATA:  80 year old female with acute shortness of breath. EXAM: PORTABLE CHEST 1 VIEW COMPARISON:  11/20/2020 FINDINGS: UPPER limits normal heart size again noted. Increased interstitial opacities are identified without focal airspace disease, pleural effusion, pneumothorax or pulmonary mass. No acute bony abnormalities are noted. IMPRESSION: Increased interstitial opacities, question interstitial edema or infection. Electronically Signed   By: Margarette Canada M.D.   On: 11/24/2020 10:26    SIGNED: Deatra James, MD, FHM. [facility],  Pager (please use [email or web address] to page/text) Please use Epic Secure Chat for non-urgent communication (7AM-7PM)  If 7PM-7AM, please contact night-coverage [email or web address], 11/24/2020, 12:01 PM

## 2020-11-24 NOTE — Progress Notes (Signed)
Patient found to be 69% this am first round. Notified Resp and placed patient back on o2. She was alert, speaking to me oriented and in no distress. Increased to 80s. Notified Dr. Roger Shelter who ordered chest xray and abg, resp in room. Called to speak to her daughter about transfer to ICU

## 2020-11-24 NOTE — Progress Notes (Addendum)
Patient converted from NSR with PACs to Afib. Rate maintaining in the 140s. BP 131/75 (95). Patient asymptomatic. Currently on bipap. EKG completed. Confirms Afib with RVR. Daughter at bedside. Dr. Roger Shelter notified.

## 2020-11-24 NOTE — Progress Notes (Signed)
Went in to check on patient.  Sat probe had been knocked off.  Placed back on patient and her sats were around mid 80s.  Had to wake patient up and get her to take some breaths in through her nose, sats came up to low 90s.  When patient would go back to sleep and not consciously breathing through nose, sats would drop again and patient was on Venturi 50% at 10L.  Tried raising to 55% 14L and still not much change.  Changed patient to NRB and now she is in upper 90s to 100%.  RN notified, will continue to monitor.

## 2020-11-25 DIAGNOSIS — D500 Iron deficiency anemia secondary to blood loss (chronic): Secondary | ICD-10-CM

## 2020-11-25 DIAGNOSIS — R1319 Other dysphagia: Secondary | ICD-10-CM

## 2020-11-25 DIAGNOSIS — J9601 Acute respiratory failure with hypoxia: Secondary | ICD-10-CM | POA: Diagnosis not present

## 2020-11-25 LAB — BASIC METABOLIC PANEL
Anion gap: 12 (ref 5–15)
BUN: 36 mg/dL — ABNORMAL HIGH (ref 8–23)
CO2: 22 mmol/L (ref 22–32)
Calcium: 8.1 mg/dL — ABNORMAL LOW (ref 8.9–10.3)
Chloride: 105 mmol/L (ref 98–111)
Creatinine, Ser: 0.68 mg/dL (ref 0.44–1.00)
GFR, Estimated: >60 mL/min (ref >60)
Glucose, Bld: 143 mg/dL — ABNORMAL HIGH (ref 70–99)
Potassium: 4.1 mmol/L (ref 3.5–5.1)
Sodium: 139 mmol/L (ref 135–145)

## 2020-11-25 LAB — CBC
HCT: 31.8 % — ABNORMAL LOW (ref 36.0–46.0)
Hemoglobin: 9.5 g/dL — ABNORMAL LOW (ref 12.0–15.0)
MCH: 27.4 pg (ref 26.0–34.0)
MCHC: 29.9 g/dL — ABNORMAL LOW (ref 30.0–36.0)
MCV: 91.6 fL (ref 80.0–100.0)
Platelets: 374 10*3/uL (ref 150–400)
RBC: 3.47 MIL/uL — ABNORMAL LOW (ref 3.87–5.11)
RDW: 14.5 % (ref 11.5–15.5)
WBC: 10.9 10*3/uL — ABNORMAL HIGH (ref 4.0–10.5)
nRBC: 0.4 % — ABNORMAL HIGH (ref 0.0–0.2)

## 2020-11-25 LAB — GLUCOSE, CAPILLARY: Glucose-Capillary: 152 mg/dL — ABNORMAL HIGH (ref 70–99)

## 2020-11-25 LAB — LACTIC ACID, PLASMA: Lactic Acid, Venous: 1.7 mmol/L (ref 0.5–1.9)

## 2020-11-25 LAB — PROCALCITONIN: Procalcitonin: <0.1 ng/mL

## 2020-11-25 LAB — C-REACTIVE PROTEIN: CRP: 1.6 mg/dL — ABNORMAL HIGH (ref <1.0)

## 2020-11-25 LAB — SEDIMENTATION RATE: Sed Rate: 35 mm/hr — ABNORMAL HIGH (ref 0–22)

## 2020-11-25 LAB — BRAIN NATRIURETIC PEPTIDE: B Natriuretic Peptide: 341 pg/mL — ABNORMAL HIGH (ref 0.0–100.0)

## 2020-11-25 MED ORDER — FUROSEMIDE 10 MG/ML IJ SOLN
40.0000 mg | Freq: Once | INTRAMUSCULAR | Status: AC
  Administered 2020-11-25: 40 mg via INTRAVENOUS
  Filled 2020-11-25: qty 4

## 2020-11-25 MED ORDER — PROPRANOLOL HCL 20 MG PO TABS
40.0000 mg | ORAL_TABLET | Freq: Two times a day (BID) | ORAL | Status: DC
  Administered 2020-11-26 – 2020-11-29 (×5): 40 mg via ORAL
  Filled 2020-11-25 (×7): qty 2

## 2020-11-25 MED ORDER — DILTIAZEM HCL 60 MG PO TABS
60.0000 mg | ORAL_TABLET | Freq: Three times a day (TID) | ORAL | Status: DC
  Administered 2020-11-25: 60 mg via ORAL
  Filled 2020-11-25: qty 1

## 2020-11-25 MED ORDER — DILTIAZEM HCL 30 MG PO TABS
30.0000 mg | ORAL_TABLET | Freq: Three times a day (TID) | ORAL | Status: DC
  Administered 2020-11-25 – 2020-11-29 (×11): 30 mg via ORAL
  Filled 2020-11-25 (×11): qty 1

## 2020-11-25 MED ORDER — GUAIFENESIN-DM 100-10 MG/5ML PO SYRP
10.0000 mL | ORAL_SOLUTION | Freq: Four times a day (QID) | ORAL | Status: DC
  Administered 2020-11-25 – 2020-11-29 (×13): 10 mL via ORAL
  Filled 2020-11-25 (×14): qty 10

## 2020-11-25 MED ORDER — PANTOPRAZOLE SODIUM 40 MG PO TBEC
40.0000 mg | DELAYED_RELEASE_TABLET | Freq: Two times a day (BID) | ORAL | Status: DC
  Administered 2020-11-26 – 2020-11-29 (×8): 40 mg via ORAL
  Filled 2020-11-25 (×9): qty 1

## 2020-11-25 MED ORDER — DIPHENHYDRAMINE HCL 25 MG PO CAPS
25.0000 mg | ORAL_CAPSULE | Freq: Every evening | ORAL | Status: DC | PRN
  Administered 2020-11-25 – 2020-11-28 (×4): 25 mg via ORAL
  Filled 2020-11-25 (×4): qty 1

## 2020-11-25 NOTE — Progress Notes (Signed)
Patient still doing well off BIPAP at this time. Will continue to monitor. 

## 2020-11-25 NOTE — Plan of Care (Signed)
  Problem: Acute Rehab PT Goals(only PT should resolve) Goal: Pt Will Go Supine/Side To Sit Outcome: Progressing Flowsheets (Taken 11/25/2020 1409) Pt will go Supine/Side to Sit:  with modified independence  with supervision Goal: Patient Will Transfer Sit To/From Stand Outcome: Progressing Flowsheets (Taken 11/25/2020 1409) Patient will transfer sit to/from stand:  with min guard assist  with minimal assist Goal: Pt Will Transfer Bed To Chair/Chair To Bed Outcome: Progressing Flowsheets (Taken 11/25/2020 1409) Pt will Transfer Bed to Chair/Chair to Bed:  min guard assist  with min assist Goal: Pt Will Ambulate Outcome: Progressing Flowsheets (Taken 11/25/2020 1409) Pt will Ambulate:  25 feet  with minimal assist  with rolling walker   2:09 PM, 11/25/20 Lonell Grandchild, MPT Physical Therapist with [facility] [phone] office [phone] mobile phone

## 2020-11-25 NOTE — Evaluation (Signed)
Physical Therapy Evaluation Patient Details Name: Monique Robles MRN: [id number] DOB: 06-02-1940 Today's Date: 11/25/2020  History of Present Illness  Monique Robles is a 80 y.o. female with medical history significant for essential tremor, prior CVA, GERD, overactive bladder who presents to the emergency department by daughter due to low oxygen saturation at home today.  Patient has been having shortness of breath worse on exertion for several months, she was referred to cardiologist who cleared her after negative cardiac work-up, she was asked to go back to her PCP, so she could be referred to a pulmonologist.  Shortness of breath got worse over the weekend (9/9) whereby she appears to be in respiratory distress on minimal exertion and today, O2 sat was noted to be a 75% on room air with home pulse oximeter.  She complained of some productive cough but denies fever, chills, runny nose or nasal congestion.  Per daughter at bedside, patient just moved to this area about 2 years ago, prior to this, she underwent an intensive work-up at [place] whereby lung biopsy was done but no pathology was noted at that time.  She was on oxygen for some time, but she has been oxygen free dependent for at least 5 to 10 years per daughter.   Clinical Impression  Patient demonstrates slow labored movement for sitting up at bedside, unsteady on feet during transfer to chair with AD having to lean on armrest of chair, limited to taking few steps forward/backwards at bedside before having to sit due fatigue and SOB with SpO2 dropping from 87% to 75%.  Patient tolerated sitting up in chair after therapy, but a few minutes later became slightly unresponsive with her daughter in room and put back to bed and found to have drop in BP - RN/RT present.  Patient will benefit from continued physical therapy in hospital and recommended venue below to increase strength, balance, endurance for safe ADLs and gait.          Recommendations for follow up therapy are one component of a multi-disciplinary discharge planning process, led by the attending physician.  Recommendations may be updated based on patient status, additional functional criteria and insurance authorization.  Follow Up Recommendations SNF    Equipment Recommendations  None recommended by PT    Recommendations for Other Services       Precautions / Restrictions Precautions Precautions: Fall Restrictions Weight Bearing Restrictions: No      Mobility  Bed Mobility Overal bed mobility: Needs Assistance Bed Mobility: Supine to Sit     Supine to sit: Min guard     General bed mobility comments: increased time, labored movement    Transfers Overall transfer level: Needs assistance Equipment used: Rolling walker (2 wheeled) Transfers: Sit to/from [facility] Sit to Stand: Min assist Stand pivot transfers: Min assist       General transfer comment: slow labored movement  Ambulation/Gait Ambulation/Gait assistance: Mod assist Gait Distance (Feet): 8 Feet Assistive device: Rolling walker (2 wheeled) Gait Pattern/deviations: Decreased step length - right;Decreased step length - left;Decreased stride length Gait velocity: decreased   General Gait Details: limited to a few steps forward/backwards and side stepping with slow labored movement and limited mostly due to SOB with SpO2 dropping from 92% to 75% while on 3 LPM  Stairs            Wheelchair Mobility    Modified Rankin (Stroke Patients Only)       Balance Overall balance assessment: Needs assistance  Sitting-balance support: Feet supported;No upper extremity supported Sitting balance-Leahy Scale: Fair Sitting balance - Comments: fair/good seated at EOB   Standing balance support: During functional activity;No upper extremity supported Standing balance-Leahy Scale: Poor Standing balance comment: fair/poor using RW                              Pertinent Vitals/Pain Pain Assessment: No/denies pain    Home Living Family/patient expects to be discharged to:: Private residence Living Arrangements: Children Available Help at Discharge: Family;Available PRN/intermittently Type of Home: House Home Access: Stairs to enter Entrance Stairs-Rails: Right;Left (to wide to reach both) Entrance Stairs-Number of Steps: 4-5 Home Layout: Two level Home Equipment: Walker - 2 wheels;Wheelchair - Brewing technologist      Prior Function Level of Independence: Needs assistance   Gait / Transfers Assistance Needed: household ambulator using RW PRN  ADL's / Homemaking Assistance Needed: assisted by family        Hand Dominance        Extremity/Trunk Assessment   Upper Extremity Assessment Upper Extremity Assessment: Generalized weakness    Lower Extremity Assessment Lower Extremity Assessment: Generalized weakness    Cervical / Trunk Assessment Cervical / Trunk Assessment: Kyphotic  Communication   Communication: No difficulties  Cognition Arousal/Alertness: Awake/alert Behavior During Therapy: WFL for tasks assessed/performed Overall Cognitive Status: Within Functional Limits for tasks assessed                                        General Comments      Exercises     Assessment/Plan    PT Assessment Patient needs continued PT services  PT Problem List Decreased strength;Decreased activity tolerance;Decreased balance;Decreased mobility;Cardiopulmonary status limiting activity       PT Treatment Interventions DME instruction;Gait training;Stair training;Functional mobility training;Therapeutic exercise;Therapeutic activities;Patient/family education;Balance training    PT Goals (Current goals can be found in the Care Plan section)  Acute Rehab PT Goals Patient Stated Goal: return home after rehab PT Goal Formulation: With patient/family Time For Goal Achievement:  12/09/20 Potential to Achieve Goals: Good    Frequency Min 3X/week   Barriers to discharge        Co-evaluation               AM-PAC PT "6 Clicks" Mobility  Outcome Measure Help needed turning from your back to your side while in a flat bed without using bedrails?: A Little Help needed moving from lying on your back to sitting on the side of a flat bed without using bedrails?: A Little Help needed moving to and from a bed to a chair (including a wheelchair)?: A Lot Help needed standing up from a chair using your arms (e.g., wheelchair or bedside chair)?: A Lot Help needed to walk in hospital room?: A Lot Help needed climbing 3-5 steps with a railing? : A Lot 6 Click Score: 14    End of Session Equipment Utilized During Treatment: Oxygen Activity Tolerance: Patient tolerated treatment well;Patient limited by fatigue Patient left: in chair;with call bell/phone within reach Nurse Communication: Mobility status PT Visit Diagnosis: Unsteadiness on feet (R26.81);Other abnormalities of gait and mobility (R26.89);Muscle weakness (generalized) (M62.81)    Time: [id number] PT Time Calculation (min) (ACUTE ONLY): 21 min   Charges:   PT Evaluation $PT Eval Moderate Complexity: 1 Mod PT Treatments $Therapeutic Activity: 8-22 mins  2:08 PM, 11/25/20 Lonell Grandchild, MPT Physical Therapist with [facility] [phone] office [phone] mobile phone

## 2020-11-25 NOTE — Progress Notes (Signed)
RN stated that patient came off BIPAP at 0200. She placed patient back on HFNC at 10 lpm. Patient currently doing well. O2 sat 94%

## 2020-11-25 NOTE — Evaluation (Signed)
Clinical/Bedside Swallow Evaluation Patient Details  Name: Monique Robles MRN: [id number] Date of Birth: 01-01-1941  Today's Date: 11/25/2020 Time: SLP Start Time (ACUTE ONLY): [id number] SLP Stop Time (ACUTE ONLY): 1020 SLP Time Calculation (min) (ACUTE ONLY): 26 min  Past Medical History:  Past Medical History:  Diagnosis Date   Aortic insufficiency    Atrial fibrillation (HCC)    Diastolic dysfunction    DOE (dyspnea on exertion)    GERD (gastroesophageal reflux disease)    History of pneumonia    History of pulmonary embolism    History of stroke    HTN (hypertension)    Hyperlipemia    Mitral valve regurgitation    Osteoporosis    Tremor    Vitamin D deficiency    Past Surgical History:  Past Surgical History:  Procedure Laterality Date   BLADDER SURGERY     x 3   CERVICAL SPINE SURGERY     CHOLECYSTECTOMY     COLONOSCOPY     COLONOSCOPY WITH ESOPHAGOGASTRODUODENOSCOPY (EGD)     LUMBAR SPINE SURGERY     REPLACEMENT TOTAL KNEE Left    RIGHT LUNG WEDGE REMOVAL     TONSILLECTOMY     HPI:  Monique Robles is a 80 y.o. female with medical history significant for essential tremor, prior CVA, GERD, overactive bladder who presents to the emergency department by daughter due to low oxygen saturation at home today (11/20/20).  Patient has been having shortness of breath worse on exertion for several months, she was referred to cardiologist who cleared her after negative cardiac work-up, she was asked to go back to her PCP, so she could be referred to a pulmonologist.  Shortness of breath got worse over the weekend  whereby she appears to be in respiratory distress on minimal exertion and today, O2 sat was noted to be a 75% on room air with home pulse oximeter.  She complained of some productive cough but denies fever, chills, runny nose or nasal congestion.  Per daughter at bedside, patient just moved to this area about 2 years ago, prior to this, she underwent an intensive work-up at  [place] whereby lung biopsy was done but no pathology was noted at that time.  She was on oxygen for some time, but she has been oxygen free dependent for at least 5 to 10 years per daughter. Pulmonology Dr. Halford Chessman has seen evaluated patient, reviewed all medical records including CT of the chest..  Patient seems to be developing acute on chronic interstitial lung disease  Possible drug toxicity-nitrofurantoin. She was transferred to the ICU and BSE requested.    Assessment / Plan / Recommendation  Clinical Impression  Clinical swallow evaluation completed at bedside with Pt's daughter present. Oral motor examination is WNL. Pt has a history of a stroke a few years ago with some residual left paresthesia. Pt consumed ice chips, water via cup/straw, puree, and graham crackers all self presented without overt signs or symptoms of aspiration or reduced airway protection. She reports globus sensation and "choking" sensation on breads and broccoli with rice at times and touches her chest as source of globus. Her daughter also confirms this and frequent throat clearing following meals. She denies difficulty swallowing liquids. She has never been seen by GI and has not had EGD or Barium Swallow per report. She describes heartburn and takes PPI once per day in the AM. 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   Pt does not appear to be aspirating during po intake and her symptoms are more consistent with esophageal dysphagia (? Hiatal hernia, dysmotility, possible laryngopharyngeal reflux). SLP reviewed aspiration and reflux precautions with Pt and daughter with a focus on benefit of gravity and oral care. SLP can complete MBSS if MD desires, however may benefit more from a barium pill esophagram and/or GI consult. Pt and daughter appreciative of visit and SLP will be back tomorrow to check on needs. Above to RN. Ok to continue D3/mech soft and thin liquids and  po medications whole with water.   IMPRESSION: 1. Multifocal pneumonia. Bilateral patchy airspace disease in both lungs. 2. Negative for a pulmonary embolism. 3. Mild mediastinal and right hilar lymphadenopathy. Findings are nonspecific but could be reactive. Consider surveillance. 4. Mild dilatation of the esophagus and questionable mild thickening in the mid/distal esophagus. Findings are nonspecific. 5. Coronary artery calcifications. SLP Visit Diagnosis: Dysphagia, unspecified (R13.10)    Aspiration Risk  Mild aspiration risk    Diet Recommendation Dysphagia 3 (Mech soft);Thin liquid   Liquid Administration via: Cup;Straw Medication Administration: Whole meds with liquid Supervision: Patient able to self feed Compensations: Slow rate;Small sips/bites Postural Changes: Seated upright at 90 degrees;Remain upright for at least 30 minutes after po intake    Other  Recommendations Recommended Consults: Consider esophageal assessment;Consider GI evaluation Oral Care Recommendations: Oral care BID Other Recommendations: Clarify dietary restrictions    Recommendations for follow up therapy are one component of a multi-disciplinary discharge planning process, led by the attending physician.  Recommendations may be updated based on patient status, additional functional criteria and insurance authorization.  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 female with medical history significant for essential tremor, prior CVA, GERD, overactive bladder who presents to the emergency department by daughter due to low  oxygen saturation at home today (11/20/20).  Patient has been having shortness of breath worse on exertion for several months, she was referred to cardiologist who cleared her after negative cardiac work-up, she was asked to go back to her PCP, so she could be referred to a pulmonologist.  Shortness of breath got worse over the weekend  whereby she appears to be in respiratory distress on minimal exertion and today, O2 sat was noted to be a 75% on room air with home pulse oximeter.  She complained of some productive cough but denies fever, chills, runny nose or nasal congestion.  Per daughter at bedside, patient just moved to this area about 2 years ago, prior to this, she underwent an intensive work-up at [place] whereby lung biopsy was done but no pathology was noted at that time.  She was on oxygen for some time, but she has been oxygen free dependent for at least 5 to 10 years per daughter. Pulmonology Dr. Halford Chessman has seen evaluated patient, reviewed all medical records including CT of the chest..  Patient seems to be developing acute on chronic interstitial lung disease  Possible drug toxicity-nitrofurantoin. She was transferred to the ICU and BSE requested. Type of Study: Bedside Swallow Evaluation Previous  Swallow Assessment: N/A Diet Prior to this Study: Dysphagia 3 (soft);Thin liquids Temperature Spikes Noted: No Respiratory Status: Nasal cannula History of Recent Intubation: No Behavior/Cognition: Alert;Cooperative;Pleasant mood Oral Cavity Assessment: Within Functional Limits Oral Care Completed by SLP: Recent completion by staff Oral Cavity - Dentition: Adequate natural dentition Vision: Functional for self-feeding Self-Feeding Abilities: Able to feed self Patient Positioning: Upright in bed Baseline Vocal Quality: Normal Volitional Cough: Strong Volitional Swallow: Able to elicit    Oral/Motor/Sensory Function Overall Oral Motor/Sensory Function: Within functional limits   Ice Chips Ice  chips: Within functional limits Presentation: Spoon   Thin Liquid Thin Liquid: Within functional limits Presentation: Cup;Self Fed;Straw    Nectar Thick Nectar Thick Liquid: Not tested   Honey Thick Honey Thick Liquid: Not tested   Puree Puree: Within functional limits Presentation: Self Fed;Spoon   Solid     Solid: Within functional limits Presentation: Self Fed     Thank you,  Monique Robles, Monique Robles  Monique Robles 11/25/2020,10:42 AM

## 2020-11-25 NOTE — Progress Notes (Signed)
Pt placed on Bipap at this time due to an episode after PT. Patient apparently became lethargic and decompensated after physical therapy. Patient placed back on Bipap at this time to help with O2. Patient responded well to bipap. SPO2 back up in the mid 90s. BP remains soft. RN still at bedside.

## 2020-11-25 NOTE — Consult Note (Addendum)
Consulting  Provider: Dr. Roger Shelter Primary Care Physician:  Celene Squibb, MD Primary Gastroenterologist: None  Reason for Consultation: Dysphagia, abnormal CT scan  HPI:  Monique Robles is a very pleasant 80 y.o. female with a past medical history of atrial fibrillation chronically on apixaban, GERD, chronic dysphagia, prior CVA, who was admitted to [facility] evening of 11/20/2020 for acute on chronic respiratory failure in the setting of acute on chronic interstitial lung disease, possible pneumonia, possible drug toxicity from chronic nitrofurantoin.  During her hospitalization, she has had difficulty with dysphagia per reports.  On further discussion with patient and her daughter Monique Robles who is bedside, patient has had chronic issues for some time now.  Notes feeling of food getting stuck in her substernal region.  Has frequent coughing, occasional regurgitation.  Chronically takes omeprazole once daily.  States she had an EGD many years ago.  She moved to our area a few years ago from [place], does not have an established gastroenterologist at present.  Believes her last colonoscopy was within the past 10 years reported normal.  No family history of colorectal malignancy.  Does not appear she is on any chronic NSAIDs.  She had a CTA chest 11/22/2020 which showed multifocal pneumonia, negative PE, but did make mention of mild dilatation of the esophagus and questionable mild thickening in the mid distal esophagus.  Patient currently receiving pantoprazole 40 mg daily while inpatient.  Patient also found to have iron deficiency anemia with hemoglobin around 9.  Hemoccult positive.  No overt melena or hematochezia per nursing.  Past Medical History:  Diagnosis Date   Aortic insufficiency    Atrial fibrillation (HCC)    Diastolic dysfunction    DOE (dyspnea on exertion)    GERD (gastroesophageal reflux disease)    History of pneumonia    History of pulmonary embolism    History of  stroke    HTN (hypertension)    Hyperlipemia    Mitral valve regurgitation    Osteoporosis    Tremor    Vitamin D deficiency     Past Surgical History:  Procedure Laterality Date   BLADDER SURGERY     x 3   CERVICAL SPINE SURGERY     CHOLECYSTECTOMY     COLONOSCOPY     COLONOSCOPY WITH ESOPHAGOGASTRODUODENOSCOPY (EGD)     LUMBAR SPINE SURGERY     REPLACEMENT TOTAL KNEE Left    RIGHT LUNG WEDGE REMOVAL     TONSILLECTOMY      Prior to Admission medications   Medication Sig Start Date End Date Taking? Authorizing Provider  Acetaminophen (TYLENOL ARTHRITIS PAIN PO) Take 650 mg by mouth daily as needed (pain).   Yes [provider]  albuterol (PROVENTIL) (2.5 MG/3ML) 0.083% nebulizer solution Take 2.5 mg by nebulization every 4 (four) hours as needed for wheezing or shortness of breath. 06/21/20  Yes [provider]  albuterol (VENTOLIN HFA) 108 (90 Base) MCG/ACT inhaler Inhale 1-2 puffs into the lungs every 4 (four) hours as needed for shortness of breath or wheezing. 06/21/20  Yes [provider]  apixaban (ELIQUIS) 5 MG TABS tablet Take 5 mg by mouth 2 (two) times daily.   Yes [provider]  calcium carbonate (TUMS - DOSED IN MG ELEMENTAL CALCIUM) 500 MG chewable tablet Chew 1 tablet by mouth daily as needed for indigestion or heartburn.   Yes [provider]  cyclobenzaprine (FLEXERIL) 5 MG tablet Take 5 mg by mouth daily as needed. 07/31/20  Yes [provider]  diphenhydrAMINE HCl (BENADRYL PO) Take 1 tablet by mouth as needed (seasonal allergies).   Yes [provider]  furosemide (LASIX) 20 MG tablet Take 20 mg by mouth daily. 07/31/20  Yes [provider]  gabapentin (NEURONTIN) 600 MG tablet Take 600 mg by mouth 3 (three) times daily.   Yes [provider]  HYDROcodone-acetaminophen (NORCO/VICODIN) 5-325 MG tablet Take 1 tablet by mouth 2 (two) times daily as needed for moderate pain. 07/31/20  Yes  [provider]  Melatonin 10 MG TABS Take 10 mg by mouth at bedtime.   Yes [provider]  Multiple Vitamin (MULTIVITAMIN) tablet Take 1 tablet by mouth daily.   Yes [provider]  nitrofurantoin (MACRODANTIN) 50 MG capsule Take 1 capsule (50 mg total) by mouth at bedtime. 09/28/20  Yes McKenzie, Candee Furbish, MD  omeprazole (PRILOSEC) 40 MG capsule Take 1 capsule by mouth daily. 06/21/20  Yes [provider]  Potassium Chloride ER 20 MEQ TBCR Take 1 tablet by mouth daily. 07/31/20  Yes [provider]  propranolol (INDERAL) 40 MG tablet Take 1 tablet (40 mg total) by mouth 2 (two) times daily. 08/15/19  Yes Marcial Pacas, MD  Vibegron (GEMTESA) 75 MG TABS Take 1 capsule by mouth daily. 09/20/20  Yes McKenzie, Candee Furbish, MD  VITAMIN D PO Take 1,000 Units by mouth daily.   Yes [provider]  nitrofurantoin, macrocrystal-monohydrate, (MACROBID) 100 MG capsule Take 1 capsule (100 mg total) by mouth every 12 (twelve) hours. Patient not taking: No sig reported 08/24/20   Cleon Gustin, MD  nitrofurantoin, macrocrystal-monohydrate, (MACROBID) 100 MG capsule Take 1 capsule (100 mg total) by mouth 2 (two) times daily. Stop taking low dose Macrodantin until this course is completed then restart. Patient not taking: No sig reported 10/02/20   Cleon Gustin, MD  Trospium Chloride 60 MG CP24 Take 1 capsule (60 mg total) by mouth daily. Patient not taking: No sig reported 09/28/20   Cleon Gustin, MD    Current Facility-Administered Medications  Medication Dose Route Frequency Provider Last Rate Last Admin   acetaminophen (TYLENOL) tablet 650 mg  650 mg Oral Q6H PRN Skipper Cliche A, MD   650 mg at 11/23/20 0734   apixaban (ELIQUIS) tablet 5 mg  5 mg Oral BID Shahmehdi, Erling Conte A, MD   5 mg at 11/25/20 0817   azelastine (ASTELIN) 0.1 % nasal spray 1 spray  1 spray Each Nare BID Shahmehdi, Erling Conte A, MD   1 spray at 11/25/20 0815   budesonide  (PULMICORT) nebulizer solution 0.25 mg  0.25 mg Nebulization BID Shahmehdi, Seyed A, MD   0.25 mg at 11/25/20 0809   cefTRIAXone (ROCEPHIN) 1 g in sodium chloride 0.9 % 100 mL IVPB  1 g Intravenous Q24H Shahmehdi, Seyed A, MD 200 mL/hr at 11/25/20 0821 1 g at 11/25/20 [id number]   Chlorhexidine Gluconate Cloth 2 % PADS 6 each  6 each Topical Daily Deatra James, MD   6 each at 11/25/20 0819   diltiazem (CARDIZEM) tablet 60 mg  60 mg Oral Q8H Shahmehdi, Seyed A, MD   60 mg at 11/25/20 0816   guaiFENesin-dextromethorphan (ROBITUSSIN DM) 100-10 MG/5ML syrup 10 mL  10 mL Oral Q6H Shahmehdi, Seyed A, MD   10 mL at 11/25/20 0816   influenza vaccine adjuvanted (FLUAD) injection 0.5 mL  0.5 mL Intramuscular Tomorrow-1000 Shahmehdi, Seyed A, MD       ipratropium (ATROVENT) nebulizer solution 0.5  mg  0.5 mg Nebulization TID Skipper Cliche A, MD   0.5 mg at 11/25/20 0753   lactobacillus (FLORANEX/LACTINEX) granules 1 g  1 g Oral TID WC Shahmehdi, Seyed A, MD   1 g at 11/25/20 0816   levalbuterol (XOPENEX) nebulizer solution 1.25 mg  1.25 mg Nebulization TID Skipper Cliche A, MD   1.25 mg at 11/25/20 0753   melatonin tablet 6 mg  6 mg Oral QHS Shahmehdi, Seyed A, MD   6 mg at 11/24/20 2148   methylPREDNISolone sodium succinate (SOLU-MEDROL) 40 mg/mL injection 40 mg  40 mg Intravenous Q6H Shahmehdi, Seyed A, MD   40 mg at 11/25/20 0746   metoprolol tartrate (LOPRESSOR) injection 5 mg  5 mg Intravenous Q5 min PRN Shahmehdi, Seyed A, MD   5 mg at 11/24/20 1520   ondansetron (ZOFRAN) tablet 4 mg  4 mg Oral Q8H PRN Shahmehdi, Seyed A, MD   4 mg at 11/22/20 1332   pantoprazole (PROTONIX) EC tablet 40 mg  40 mg Oral Daily Shahmehdi, Seyed A, MD   40 mg at 11/25/20 0817   propranolol (INDERAL) tablet 40 mg  40 mg Oral BID Shahmehdi, Seyed A, MD   40 mg at 11/25/20 0816   sodium chloride (OCEAN) 0.65 % nasal spray 1 spray  1 spray Each Nare PRN Deatra James, MD   1 spray at 11/23/20 1602   sodium chloride  (OCEAN) 0.65 % nasal spray 2 spray  2 spray Each Nare BID Deatra James, MD   2 spray at 11/25/20 0815   Vibegron TABS 1 tablet  1 tablet Oral Daily Skipper Cliche A, MD   1 tablet at 11/25/20 0819    Allergies as of 11/20/2020 - Review Complete 11/20/2020  Allergen Reaction Noted   Coumadin [warfarin] Hives 08/15/2019   Atorvastatin Other (See Comments) 08/15/2019   Ciprofloxacin  08/24/2020   Sulfa antibiotics Itching and Rash 08/11/2019    Family History  Problem Relation Age of Onset   Alzheimer's disease Mother        died at 69   Cancer Mother    Tremor Mother    Pneumonia Father    Heart disease Father    Diabetes Father    Heart attack Father        died at 66    Social History   Socioeconomic History   Marital status: Widowed    Spouse name: Not on file   Number of children: 3   Years of education: 12   Highest education level: High school graduate  Occupational History   Occupation: Retired  Tobacco Use   Smoking status: Never   Smokeless tobacco: Never  Vaping Use   Vaping Use: Never used  Substance and Sexual Activity   Alcohol use: Not Currently   Drug use: Never   Sexual activity: Not on file  Other Topics Concern   Not on file  Social History Narrative   Lives with her daughter.   Right-handed.   Rare caffeine use.   Social Determinants of Health   Financial Resource Strain: Not on file  Food Insecurity: Not on file  Transportation Needs: Not on file  Physical Activity: Not on file  Stress: Not on file  Social Connections: Not on file  Intimate Partner Violence: Not on file    Review of Systems: General: Negative for anorexia, weight loss, fever, chills, fatigue, weakness. Eyes: Negative for vision changes.  ENT: Negative for hoarseness, difficulty swallowing , nasal congestion.  CV: Negative for chest pain, angina, palpitations, peripheral edema.  Respiratory: Positive for dyspnea at rest and exertion, positive for cough GI:  See history of present illness. GU:  Negative for dysuria, hematuria, urinary incontinence, urinary frequency, nocturnal urination.  MS: Negative for joint pain, low back pain.  Derm: Negative for rash or itching.  Neuro: Negative for weakness, abnormal sensation, seizure, frequent headaches, memory loss, confusion.  Psych: Negative for anxiety, depression Endo: Negative for unusual weight change.  Heme: Negative for bruising or bleeding. Allergy: Negative for rash or hives.  Physical Exam: Vital signs in last 24 hours: Temp:  [97.7 F (36.5 C)-98.6 F (37 C)] 97.9 F (36.6 C) (09/18 0730) Pulse Rate:  [50-132] 85 (09/18 0830) Resp:  [15-33] 20 (09/18 0830) BP: (87-143)/(47-83) 102/56 (09/18 0830) SpO2:  [87 %-100 %] 92 % (09/18 0830) FiO2 (%):  [60 %] 60 % (09/17 1320)   General:   Alert,  Well-developed, well-nourished, pleasant and cooperative in NAD Head:  Normocephalic and atraumatic. Eyes:  Sclera clear, no icterus.   Conjunctiva pink. Ears:  Normal auditory acuity. Nose:  No deformity, discharge,  or lesions. Mouth:  No deformity or lesions, dentition normal. Lungs: Shallow breaths, few crackles Heart:  Regular rate, abnormal rhythm, no murmurs, clicks, rubs,  or gallops. Abdomen:  Soft, nontender and nondistended. No masses, hepatosplenomegaly or hernias noted. Normal bowel sounds, without guarding, and without rebound.   Msk:  Symmetrical without gross deformities. Normal posture. Pulses:  Normal pulses noted. Extremities:  Without clubbing or edema. Neurologic:  Alert and  oriented x4;  grossly normal neurologically. Skin:  Intact without significant lesions or rashes. Psych:  Alert and cooperative. Normal mood and affect.  Intake/Output from previous day: 09/17 0701 - 09/18 0700 In: 274.9 [P.O.:180; I.V.:94.9] Out: 1200 [Urine:1200] Intake/Output this shift: No intake/output data recorded.  Lab Results: Recent Labs    11/23/20 0615 11/24/20 0525  11/25/20 0328  WBC 15.0* 11.2* 10.9*  HGB 8.6* 8.8* 9.5*  HCT 28.8* 29.2* 31.8*  PLT 363 365 374   BMET Recent Labs    11/23/20 0615 11/24/20 1529 11/25/20 0328  NA 139 136 139  K 4.3 3.8 4.1  CL 109 103 105  CO2 21* 26 22  GLUCOSE 121* 178* 143*  BUN 27* 39* 36*  CREATININE 0.60 0.81 0.68  CALCIUM 8.4* 7.8* 8.1*   LFT No results for input(s): PROT, ALBUMIN, AST, ALT, ALKPHOS, BILITOT, BILIDIR, IBILI in the last 72 hours. PT/INR No results for input(s): LABPROT, INR in the last 72 hours. Hepatitis Panel No results for input(s): HEPBSAG, HCVAB, HEPAIGM, HEPBIGM in the last 72 hours. C-Diff No results for input(s): CDIFFTOX in the last 72 hours.  Studies/Results: DG CHEST PORT 1 VIEW  Result Date: 11/24/2020 CLINICAL DATA:  79 year old female with acute shortness of breath. EXAM: PORTABLE CHEST 1 VIEW COMPARISON:  11/20/2020 FINDINGS: UPPER limits normal heart size again noted. Increased interstitial opacities are identified without focal airspace disease, pleural effusion, pneumothorax or pulmonary mass. No acute bony abnormalities are noted. IMPRESSION: Increased interstitial opacities, question interstitial edema or infection. Electronically Signed   By: Margarette Canada M.D.   On: 11/24/2020 10:26    Impression: *Dysphagia-chronic, worsening *Abnormal CT scan-esophageal thickening *Chronic GERD *Iron deficiency anemia  Plan: Given patient's abnormal CT imaging as well as chronic dysphagia and possible aspiration pneumonia, would recommend further evaluation with EGD with possible esophageal dilation.  Given her tenuous respiratory status, would hold off for now.  Hopefully she continues to  improve and we can have this done while inpatient.  Patient chronically on apixaban for atrial fibrillation with history of stroke in 2018.  She will need to be off this medication for at least 48 hours prior to EGD in case she requires dilation.  Will discuss with hospitalist, If we can  start holding today, will tentatively plan on EGD 11/27/20 pending her clinical course.  Agree with speech evaluation.  She may benefit from barium swallow study.  I will increase her pantoprazole to 40 mg twice daily.  Given her iron deficiency anemia with Hemoccult positive stool, I would recommend colonoscopy as well.  This can likely be handled in the outpatient setting as patient has not demonstrated any overt GI bleeding while inpatient.  Thank you for the consultation, GI to continue to follow.   Elon Alas. Abbey Chatters, D.O. Gastroenterology and Hepatology [facility]    LOS: 4 days     11/25/2020, 10:56 AM

## 2020-11-25 NOTE — Progress Notes (Signed)
PROGRESS NOTE    Patient: Monique Robles                            PCP: Celene Squibb, MD                    DOB: Aug 17, 1940            DOA: 11/20/2020 [id number]             DOS: 11/25/2020, 10:46 AM   LOS: 4 days   Date of Service: The patient was seen and examined on 11/25/2020  Subjective:   The patient was seen and examined this morning, off BiPAP, on 10 L high flow oxygen, satting 92%... Tolerated few hours of BiPAP yesterday. Awake, alert, moderate shortness of breath, gets worse with any exertion,  11/24/2020 evening patient went to A. fib with RVR, was started on Cardizem drip  This morning was denies any chest pain...   Brief Narrative:    Elsi Budd is a 80 y.o. female with medical history significant for essential tremor, prior CVA, GERD, overactive bladder who presents to the emergency department by daughter due to low oxygen saturation at home today.  Shortness of breath over past several month continue getting worse.  Status post recent extensive cardiac work-up which has been negative, PCP has referred to pulmonologist for further evaluation.    Shortness of breath got worse over the weekend (9/9) whereby she appears to be in respiratory distress on minimal exertion and today, O2 sat was noted to be a 75% on room air with home pulse oximeter.  She complained of some productive cough but denies fever, chills, runny nose or nasal congestion.    Per patient had an extensive work-up 2 years ago in [place] including lung biopsy with no specific pathology.  In ED was satting 87% room air, CBC CMP within normal limits, influenza A, B, SARS-CoV-2 all negative chest x-ray revealing chronic interstitial lung disease   Assessment & Plan:   Principal Problem:   Acute respiratory failure with hypoxia (HCC) Active Problems:   Iron deficiency anemia due to chronic blood loss   Essential tremor   Cerebrovascular accident (CVA) (HCC)   Overactive bladder   GERD  (gastroesophageal reflux disease)   Bronchitis   Obesity (BMI 30-39.9)   Insomnia   Acute respiratory failure (HCC)   Acute respiratory failure with hypoxia possibly secondary to acute bronchitis -Continue worsening respiratory distress, was briefly on BiPAP on 11/24/2020, Currently satting 92% on 10 L high flow oxygen by nasal cannula, Patient is agreeable to retry BiPAP for few hours today  -Last: ABG 7.45, PCO2 36.3, PO2 57.6, bicarb of 25.7 - continue with IV Lasix, to dose 40 mg was given yesterday, another dose will be given today -Chest x-ray increased opacity, will repeat in a.m. -DuoNeb breathing treatment, Pulmicort, Xopenex scheduled treatment   -CT of the chest was reviewed, chronic interstitial lung disease identified, negative for pulmonary embolism, possible multifocal pneumonia   -We will cont. mucolytic's, Mucinex, IV steroids, azithromycin, duo nebs, spirometer, flutter valve -We will continue antibiotics Rocephin and azithromycin   - Pulmonology Dr. Halford Chessman has seen evaluated patient, reviewed all medical records including CT of the chest..  Patient seems to be developing acute on chronic interstitial lung disease Possible drug toxicity-nitrofurantoin  -We will continue to manage patient in ICU setting   A. fib with RVR -history of A. fib -On  propanolol, Eliquis, -Likely exacerbated by albuterol DuoNeb breathing treatment, will switch to Xopenex, Pulmicort nebs -Was started on Cardizem drip, rate has improved, will switch to p.o. Cardizem today 11/25/2020  Essential tremor -continue propanolol -Stable  History of CVA -Stable, continue Eliquis,  -Not on any statins   GERD Continue Protonix Stable   Overactive bladder Continue trospium and vibegron Stable   Insomnia Continue melatonin   Obesity (BMI 34.96) Patient will be counseled on diet and lifestyle modification when more stable   Chronic anemia -iron deficiency anemia -Iron studies revealed  severe iron deficiency anemia -Holding iron supplements at this time due to infection Planning for supplement iron once patient stable from the infection standpoint -We will obtain iron studies, Hemoccult. -Monitoring H&H, currently stable   DVT prophylaxis: On Eliquis   Code Status: Full code (confirmed by patient)   Family Communication: No family present this a.m. Daughter was at bedside on admission   Disposition Plan:  Patient is from:                        home Anticipated DC to:                   SNF or family members home with [facility]  Anticipated DC date:               2-3 days Anticipated DC barriers:          Patient requires inpatient management due to hypoxia secondary to possible acute bronchitis    Level of care: ICU   Procedures:   No admission procedures for hospital encounter.    Antimicrobials:  Anti-infectives (From admission, onward)    Start     Dose/Rate Route Frequency Ordered Stop   11/23/20 0900  cefTRIAXone (ROCEPHIN) 1 g in sodium chloride 0.9 % 100 mL IVPB        1 g 200 mL/hr over 30 Minutes Intravenous Every 24 hours 11/23/20 0752     11/22/20 1000  azithromycin (ZITHROMAX) tablet 250 mg       See Hyperspace for full Linked Orders Report.   250 mg Oral Daily 11/21/20 0227 11/25/20 0817   11/21/20 1000  azithromycin (ZITHROMAX) tablet 500 mg       See Hyperspace for full Linked Orders Report.   500 mg Oral Daily 11/21/20 0227 11/21/20 1116   11/20/20 2230  cefTRIAXone (ROCEPHIN) 1 g in sodium chloride 0.9 % 100 mL IVPB        1 g 200 mL/hr over 30 Minutes Intravenous  Once 11/20/20 2219 11/21/20 0107        Medication:   apixaban  5 mg Oral BID   azelastine  1 spray Each Nare BID   budesonide (PULMICORT) nebulizer solution  0.25 mg Nebulization BID   Chlorhexidine Gluconate Cloth  6 each Topical Daily   diltiazem  60 mg Oral Q8H   guaiFENesin-dextromethorphan  10 mL Oral Q6H   influenza vaccine adjuvanted  0.5 mL Intramuscular  Tomorrow-1000   ipratropium  0.5 mg Nebulization TID   lactobacillus  1 g Oral TID WC   levalbuterol  1.25 mg Nebulization TID   melatonin  6 mg Oral QHS   methylPREDNISolone (SOLU-MEDROL) injection  40 mg Intravenous Q6H   pantoprazole  40 mg Oral Daily   propranolol  40 mg Oral BID   sodium chloride  2 spray Each Nare BID   Vibegron  1 tablet Oral Daily  acetaminophen, metoprolol tartrate, ondansetron, sodium chloride   Objective:   Vitals:   11/25/20 0730 11/25/20 0745 11/25/20 0746 11/25/20 0830  BP: 110/60   (!) 102/56  Pulse: 70 78  85  Resp: (!) 25 (!) 21 (!) 24 20  Temp: 97.9 F (36.6 C)     TempSrc: Axillary     SpO2: 99% 98% 99% 92%  Weight:      Height:        Intake/Output Summary (Last 24 hours) at 11/25/2020 1046 Last data filed at 11/25/2020 0700 Gross per 24 hour  Intake 274.92 ml  Output 1200 ml  Net -925.08 ml   Filed Weights   11/20/20 2015 11/21/20 2122 11/24/20 0927  Weight: 83.9 kg 85.2 kg 84.6 kg     Examination:      Physical Exam:   General:  Alert, oriented, cooperative, no distress; on high flow oxygen 10 L,  HEENT:  Normocephalic, PERRL, otherwise with in Normal limits   Neuro:  CNII-XII intact. , normal motor and sensation, reflexes intact   Lungs:   Shortness of breath, mildly labored breathing, diffuse rhonchi, negative any wheezing, mild crackles in lower lobes  Cardio:    Irregularly irregular, tachycardic, in A. fib no murmure, No Rubs or Gallops   Abdomen:   Soft, non-tender, bowel sounds active all four quadrants,  no guarding or peritoneal signs.  Muscular skeletal:  Global generalized weaknesses, Limited exam - in bed, able to move all 4 extremities, 2+ pulses,  symmetric, No pitting edema  Skin:  Dry, warm to touch, negative for any Rashes,  Wounds: Please see nursing documentation                 -------------------------------------------------------------------------------------------------------------------------    LABs:  CBC Latest Ref Rng & Units 11/25/2020 11/24/2020 11/23/2020  WBC 4.0 - 10.5 K/uL 10.9(H) 11.2(H) 15.0(H)  Hemoglobin 12.0 - 15.0 g/dL 9.5(L) 8.8(L) 8.6(L)  Hematocrit 36.0 - 46.0 % 31.8(L) 29.2(L) 28.8(L)  Platelets 150 - 400 K/uL 374 365 363   CMP Latest Ref Rng & Units 11/25/2020 11/24/2020 11/23/2020  Glucose 70 - 99 mg/dL 143(H) 178(H) 121(H)  BUN 8 - 23 mg/dL 36(H) 39(H) 27(H)  Creatinine 0.44 - 1.00 mg/dL 0.68 0.81 0.60  Sodium 135 - 145 mmol/L 139 136 139  Potassium 3.5 - 5.1 mmol/L 4.1 3.8 4.3  Chloride 98 - 111 mmol/L 105 103 109  CO2 22 - 32 mmol/L 22 26 21(L)  Calcium 8.9 - 10.3 mg/dL 8.1(L) 7.8(L) 8.4(L)  Total Protein 6.5 - 8.1 g/dL - - -  Total Bilirubin 0.3 - 1.2 mg/dL - - -  Alkaline Phos 38 - 126 U/L - - -  AST 15 - 41 U/L - - -  ALT 0 - 44 U/L - - -       Micro Results Recent Results (from the past 240 hour(s))  Resp Panel by RT-PCR (Flu A&B, Covid) Nasopharyngeal Swab     Status: None   Collection Time: 11/20/20  9:56 PM   Specimen: Nasopharyngeal Swab; Nasopharyngeal(NP) swabs in vial transport medium  Result Value Ref Range Status   SARS Coronavirus 2 by RT PCR NEGATIVE NEGATIVE Final    Comment: (NOTE) SARS-CoV-2 target nucleic acids are NOT DETECTED.  The SARS-CoV-2 RNA is generally detectable in upper respiratory specimens during the acute phase of infection. The lowest concentration of SARS-CoV-2 viral copies this assay can detect is 138 copies/mL. A negative result does not preclude SARS-Cov-2 infection and should not be used  as the sole basis for treatment or other patient management decisions. A negative result may occur with  improper specimen collection/handling, submission of specimen other than nasopharyngeal swab, presence of viral mutation(s) within the areas targeted by this assay, and inadequate number of  viral copies(<138 copies/mL). A negative result must be combined with clinical observations, patient history, and epidemiological information. The expected result is Negative.  Fact Sheet for Patients:  EntrepreneurPulse.com.au  Fact Sheet for Healthcare Providers:  IncredibleEmployment.be  This test is no t yet approved or cleared by the [place] FDA and  has been authorized for detection and/or diagnosis of SARS-CoV-2 by FDA under an Emergency Use Authorization (EUA). This EUA will remain  in effect (meaning this test can be used) for the duration of the COVID-19 declaration under Section 564(b)(1) of the Act, 21 U.S.C.section 360bbb-3(b)(1), unless the authorization is terminated  or revoked sooner.       Influenza A by PCR NEGATIVE NEGATIVE Final   Influenza B by PCR NEGATIVE NEGATIVE Final    Comment: (NOTE) The Xpert Xpress SARS-CoV-2/FLU/RSV plus assay is intended as an aid in the diagnosis of influenza from Nasopharyngeal swab specimens and should not be used as a sole basis for treatment. Nasal washings and aspirates are unacceptable for Xpert Xpress SARS-CoV-2/FLU/RSV testing.  Fact Sheet for Patients: EntrepreneurPulse.com.au  Fact Sheet for Healthcare Providers: IncredibleEmployment.be  This test is not yet approved or cleared by the [place] FDA and has been authorized for detection and/or diagnosis of SARS-CoV-2 by FDA under an Emergency Use Authorization (EUA). This EUA will remain in effect (meaning this test can be used) for the duration of the COVID-19 declaration under Section 564(b)(1) of the Act, 21 U.S.C. section 360bbb-3(b)(1), unless the authorization is terminated or revoked.  Performed at [facility], [address], [place], [place] [address]   MRSA Next Gen by PCR, Nasal     Status: None   Collection Time: 11/24/20  9:25 AM   Specimen: Nasal Mucosa; Nasal Swab   Result Value Ref Range Status   MRSA by PCR Next Gen NOT DETECTED NOT DETECTED Final    Comment: (NOTE) The GeneXpert MRSA Assay (FDA approved for NASAL specimens only), is one component of a comprehensive MRSA colonization surveillance program. It is not intended to diagnose MRSA infection nor to guide or monitor treatment for MRSA infections. Test performance is not FDA approved in patients less than 67 years old. Performed at [facility] [address] [place], [place] [address]     Radiology Reports DG Chest 2 View  Result Date: 11/20/2020 CLINICAL DATA:  Worsening shortness of breath. EXAM: CHEST - 2 VIEW COMPARISON:  Jul 31, 2020 FINDINGS: Stable, mild to moderate severity diffuse chronic appearing increased interstitial lung markings are seen. There is no evidence of a pleural effusion or pneumothorax. The heart size and mediastinal contours are within normal limits. There is mild calcification of the aortic arch. A chronic sixth right rib deformity is seen. Degenerative changes are noted throughout the thoracic spine. IMPRESSION: Chronic interstitial lung disease without evidence of acute or active cardiopulmonary disease. Electronically Signed   By: Virgina Norfolk M.D.   On: 11/20/2020 21:09   CT Angio Chest Pulmonary Embolism (PE) W or WO Contrast  Result Date: 11/22/2020 CLINICAL DATA:  Chest pain shortness of breath, pleurisy or effusion suspected. EXAM: CT ANGIOGRAPHY CHEST WITH CONTRAST TECHNIQUE: Multidetector CT imaging of the chest was performed using the standard protocol during bolus administration of intravenous contrast. Multiplanar  CT image reconstructions and MIPs were obtained to evaluate the vascular anatomy. CONTRAST:  68m OMNIPAQUE IOHEXOL 350 MG/ML SOLN COMPARISON:  Chest radiograph 11/20/2020 FINDINGS: Cardiovascular: Satisfactory opacification of the pulmonary arteries without evidence of pulmonary embolism. Heart size is upper limits of normal. Left atrium is  dilated measuring 5.1 cm in the AP dimension. No significant pericardial effusion. Evidence for coronary artery calcifications. Mediastinum/Nodes: Enlarged prevascular lymph node measuring 1.2 cm in the short axis on sequence 4, image 31. Prominent right hilar lymph node measuring 1.0 cm in the short axis on sequence 4, image 47. Prevascular lymph node measuring 1.2 cm in the short axis on sequence 4, image 27. Difficult to exclude wall thickening in the mid/distal esophagus. Proximal and mid esophagus is distended. Lungs/Pleura: Trachea and mainstem bronchi are patent. Diffuse patchy bilateral airspace disease in both lungs and involving all of the lobes. Calcified nodule along the right minor fissure on sequence 6, image 64 that measures 5 mm. Trace bilateral pleural fluid versus pleural thickening. Upper Abdomen: No acute abnormality in the upper abdomen. Musculoskeletal: No acute bone abnormality. Review of the MIP images confirms the above findings. IMPRESSION: 1. Multifocal pneumonia. Bilateral patchy airspace disease in both lungs. 2. Negative for a pulmonary embolism. 3. Mild mediastinal and right hilar lymphadenopathy. Findings are nonspecific but could be reactive. Consider surveillance. 4. Mild dilatation of the esophagus and questionable mild thickening in the mid/distal esophagus. Findings are nonspecific. 5. Coronary artery calcifications. Electronically Signed   By: AMarkus DaftM.D.   On: 11/22/2020 17:19   DG CHEST PORT 1 VIEW  Result Date: 11/24/2020 CLINICAL DATA:  80year old female with acute shortness of breath. EXAM: PORTABLE CHEST 1 VIEW COMPARISON:  11/20/2020 FINDINGS: UPPER limits normal heart size again noted. Increased interstitial opacities are identified without focal airspace disease, pleural effusion, pneumothorax or pulmonary mass. No acute bony abnormalities are noted. IMPRESSION: Increased interstitial opacities, question interstitial edema or infection. Electronically Signed    By: JMargarette CanadaM.D.   On: 11/24/2020 10:26    SIGNED: SDeatra James MD, FHM. [facility],  Pager (please use [email or web address] to page/text) Please use Epic Secure Chat for non-urgent communication (7AM-7PM)  If 7PM-7AM, please contact night-coverage [email or web address], 11/25/2020, 10:46 AM > 66 minutes of critical care time was spent in evaluating the patient, reviewing all labs, medication adjustment, treatment plan, discussing plan of care with patient and ICU staff.

## 2020-11-26 ENCOUNTER — Inpatient Hospital Stay (HOSPITAL_COMMUNITY): Payer: Medicare Other

## 2020-11-26 DIAGNOSIS — J9601 Acute respiratory failure with hypoxia: Secondary | ICD-10-CM | POA: Diagnosis not present

## 2020-11-26 DIAGNOSIS — R1319 Other dysphagia: Secondary | ICD-10-CM

## 2020-11-26 DIAGNOSIS — J189 Pneumonia, unspecified organism: Secondary | ICD-10-CM

## 2020-11-26 DIAGNOSIS — J984 Other disorders of lung: Secondary | ICD-10-CM | POA: Diagnosis not present

## 2020-11-26 DIAGNOSIS — T50905A Adverse effect of unspecified drugs, medicaments and biological substances, initial encounter: Secondary | ICD-10-CM

## 2020-11-26 LAB — BASIC METABOLIC PANEL
Anion gap: 6 (ref 5–15)
BUN: 44 mg/dL — ABNORMAL HIGH (ref 8–23)
CO2: 27 mmol/L (ref 22–32)
Calcium: 8.1 mg/dL — ABNORMAL LOW (ref 8.9–10.3)
Chloride: 103 mmol/L (ref 98–111)
Creatinine, Ser: 0.74 mg/dL (ref 0.44–1.00)
GFR, Estimated: >60 mL/min (ref >60)
Glucose, Bld: 136 mg/dL — ABNORMAL HIGH (ref 70–99)
Potassium: 4 mmol/L (ref 3.5–5.1)
Sodium: 136 mmol/L (ref 135–145)

## 2020-11-26 LAB — CBC
HCT: 31.7 % — ABNORMAL LOW (ref 36.0–46.0)
Hemoglobin: 9.7 g/dL — ABNORMAL LOW (ref 12.0–15.0)
MCH: 27.4 pg (ref 26.0–34.0)
MCHC: 30.6 g/dL (ref 30.0–36.0)
MCV: 89.5 fL (ref 80.0–100.0)
Platelets: 429 10*3/uL — ABNORMAL HIGH (ref 150–400)
RBC: 3.54 MIL/uL — ABNORMAL LOW (ref 3.87–5.11)
RDW: 14.4 % (ref 11.5–15.5)
WBC: 10.8 10*3/uL — ABNORMAL HIGH (ref 4.0–10.5)
nRBC: 0.5 % — ABNORMAL HIGH (ref 0.0–0.2)

## 2020-11-26 IMAGING — DX DG CHEST 1V PORT
1 series · 1 of 1 positions shown · non-contrast
Comparison: [DATE].

CLINICAL DATA: Shortness of breath.

EXAM:
PORTABLE CHEST 1 VIEW

[chest ap]
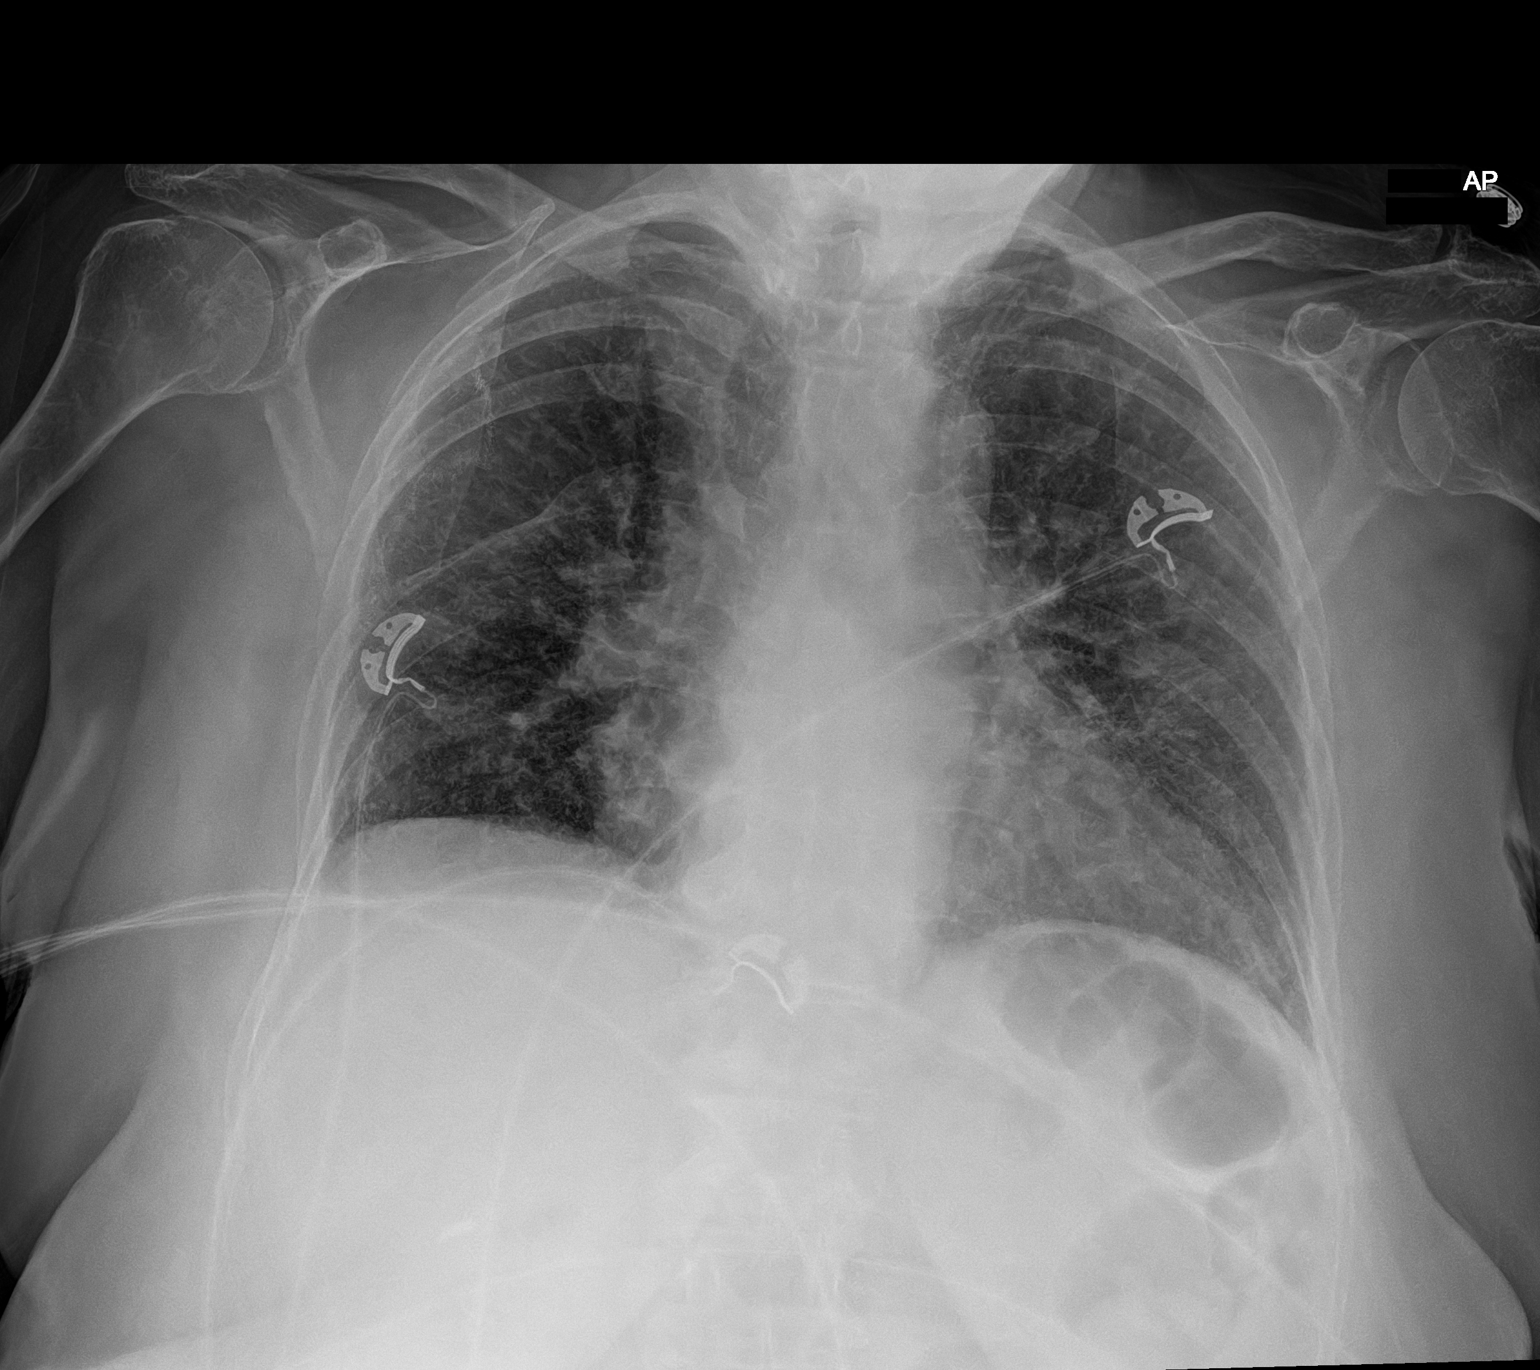

[1 of 1 positions shown; findings below may reference images not displayed]

FINDINGS: The heart size and mediastinal contours are within normal limits.
Stable interstitial densities are noted throughout both lungs which
may represent scarring, atypical inflammation or edema. The
visualized skeletal structures are unremarkable.
IMPRESSION: Stable interstitial densities are noted as described above.

## 2020-11-26 MED ORDER — HYDRALAZINE HCL 20 MG/ML IJ SOLN: 10.0000 mg | INTRAMUSCULAR | Status: DC | PRN

## 2020-11-26 MED ORDER — METHYLPREDNISOLONE SODIUM SUCC 40 MG IJ SOLR
40.0000 mg | Freq: Two times a day (BID) | INTRAMUSCULAR | Status: DC
  Administered 2020-11-27 – 2020-11-29 (×5): 40 mg via INTRAVENOUS
  Filled 2020-11-26 (×6): qty 1

## 2020-11-26 MED ORDER — METOPROLOL TARTRATE 5 MG/5ML IV SOLN: 5.0000 mg | INTRAVENOUS | Status: DC | PRN

## 2020-11-26 MED ORDER — SENNOSIDES-DOCUSATE SODIUM 8.6-50 MG PO TABS: 1.0000 | ORAL_TABLET | Freq: Every evening | ORAL | Status: DC | PRN

## 2020-11-26 MED ORDER — ENOXAPARIN SODIUM 80 MG/0.8ML IJ SOSY
80.0000 mg | PREFILLED_SYRINGE | Freq: Two times a day (BID) | INTRAMUSCULAR | Status: DC
  Administered 2020-11-26 – 2020-11-27 (×3): 80 mg via SUBCUTANEOUS
  Filled 2020-11-26 (×3): qty 0.8

## 2020-11-26 NOTE — Progress Notes (Addendum)
PROGRESS NOTE    Monique Robles  [id number] DOB: 12-24-1940 DOA: 11/20/2020 PCP: Celene Squibb, MD   Brief Narrative:  80 year old with history of essential tremor, prior CVA, GERD, overactive bladder came to the hospital for evaluation of hypoxia and progressive shortness of breath.  Patient was diagnosed with likely bronchitis with intermittent fluid overload requiring Lasix.  There is a possibility patient may have drug-related toxicity from nitrofurantoin.  Seen by pulmonary.Hospital stay also complicated by A. fib with RVR started on Cardizem drip.   Assessment & Plan:   Principal Problem:   Acute respiratory failure with hypoxia (HCC) Active Problems:   Essential tremor   Cerebrovascular accident (CVA) ([place])   Overactive bladder   GERD (gastroesophageal reflux disease)   Bronchitis   Obesity (BMI 30-39.9)   Insomnia   Acute respiratory failure (HCC)   Iron deficiency anemia due to chronic blood loss   Acute respiratory failure with hypoxia likely from nitrofurantoin pulmonary toxicity -She still off-and-on requiring BiPAP.  Monitor volume status.  Aim for negative fluid balance -CT of the chest was reviewed, chronic interstitial lung disease identified, negative for pulmonary embolism, possible multifocal pneumonia -Reduce IV Solu-Medrol 40 mg every 12 hours, taper off over 2 weeks and follow-up outpatient pulmonary. -Bronchodilators/I-S/flutter    A. fib with RVR -history of A. fib -Rate is better controlled now.  Continue propranolol, IV as needed Lopressor.  Hold Eliquis in anticipation for possible GI intervention but in the meantime we will use Lovenox 1 mg/kg every 12 hours  Dysphagia - Barium esophagram shows esophageal motility disorder.  GI team is following.   Essential tremor -continue propanolol -Stable  History of CVA -Stable, continue Eliquis,  -Not on any statins    GERD Continue Protonix Stable   Overactive bladder Continue trospium and  vibegron Stable    Insomnia Continue melatonin   Obesity (BMI 34.96) Patient will be counseled on diet and lifestyle modification when more stable   Chronic anemia -iron deficiency anemia -Eventually will need outpatient colonoscopy    DVT prophylaxis: On Eliquis Code Status: Full code Family Communication: Daughter present at bedside  Status is: Inpatient  Remains inpatient appropriate because:Inpatient level of care appropriate due to severity of illness  Dispo: The patient is from: Home              Anticipated d/c is to: Home              Patient currently is not medically stable to d/c.   Difficult to place patient No       Subjective: Patient seen and examined at bedside this morning, she had just come off her BiPAP.  Still requiring about 6-7 L of nasal cannula.  Has exertional dyspnea  Review of Systems Otherwise negative except as per HPI, including: General: Denies fever, chills, night sweats or unintended weight loss. Resp: Denies cough, wheezing, shortness of breath. Cardiac: Denies chest pain, palpitations, orthopnea, paroxysmal nocturnal dyspnea. GI: Denies abdominal pain, nausea, vomiting, diarrhea or constipation GU: Denies dysuria, frequency, hesitancy or incontinence MS: Denies muscle aches, joint pain or swelling Neuro: Denies headache, neurologic deficits (focal weakness, numbness, tingling), abnormal gait Psych: Denies anxiety, depression, SI/HI/AVH Skin: Denies new rashes or lesions ID: Denies sick contacts, exotic exposures, travel  Examination:  General exam: Appears calm and comfortable, on 6 L nasal cannula Respiratory system: Diminished breath sounds bilaterally especially at the bases Cardiovascular system: S1 & S2 heard, RRR. No JVD, murmurs, rubs, gallops or clicks. No  pedal edema. Gastrointestinal system: Abdomen is nondistended, soft and nontender. No organomegaly or masses felt. Normal bowel sounds heard. Central nervous system:  Alert and oriented. No focal neurological deficits. Extremities: Symmetric 5 x 5 power. Skin: No rashes, lesions or ulcers Psychiatry: Judgement and insight appear normal. Mood & affect appropriate.     Objective: Vitals:   11/26/20 0600 11/26/20 0700 11/26/20 0721 11/26/20 0727  BP: 122/76 97/62    Pulse: 89 79 76   Resp: (!) 23 (!) 26 (!) 22   Temp:  97.6 F (36.4 C) 97.6 F (36.4 C)   TempSrc:  Oral Oral   SpO2: 95% 94% 94% 93%  Weight:      Height:        Intake/Output Summary (Last 24 hours) at 11/26/2020 0813 Last data filed at 11/26/2020 0600 Gross per 24 hour  Intake 304.23 ml  Output 1151 ml  Net -846.77 ml   Filed Weights   11/21/20 2122 11/24/20 0927 11/26/20 0456  Weight: 85.2 kg 84.6 kg 83.2 kg     Data Reviewed:   CBC: Recent Labs  Lab 11/20/20 2251 11/21/20 0414 11/23/20 0615 11/24/20 0525 11/25/20 0328 11/26/20 0420  WBC 8.4 7.8 15.0* 11.2* 10.9* 10.8*  NEUTROABS 4.9  --  13.5*  --   --   --   HGB 9.7* 9.6* 8.6* 8.8* 9.5* 9.7*  HCT 32.2* 32.3* 28.8* 29.2* 31.8* 31.7*  MCV 92.8 92.6 94.7 91.8 91.6 89.5  PLT 341 322 363 365 374 [id number]*   Basic Metabolic Panel: Recent Labs  Lab 11/21/20 0414 11/23/20 0615 11/24/20 1529 11/25/20 0328 11/26/20 0420  NA 137 139 136 139 136  K 4.0 4.3 3.8 4.1 4.0  CL 109 109 103 105 103  CO2 20* 21* '26 22 27  '$ GLUCOSE 139* 121* 178* 143* 136*  BUN 17 27* 39* 36* 44*  CREATININE 0.59 0.60 0.81 0.68 0.74  CALCIUM 8.2* 8.4* 7.8* 8.1* 8.1*  MG 2.1  --  2.3  --   --   PHOS 3.2  --   --   --   --    GFR: Estimated Creatinine Clearance: 56.7 mL/min (by C-G formula based on SCr of 0.74 mg/dL). Liver Function Tests: Recent Labs  Lab 11/21/20 0414  AST 18  ALT 9  ALKPHOS 85  BILITOT 0.5  PROT 6.8  ALBUMIN 3.3*   No results for input(s): LIPASE, AMYLASE in the last 168 hours. No results for input(s): AMMONIA in the last 168 hours. Coagulation Profile: Recent Labs  Lab 11/21/20 0414  INR 1.2    Cardiac Enzymes: No results for input(s): CKTOTAL, CKMB, CKMBINDEX, TROPONINI in the last 168 hours. BNP (last 3 results) No results for input(s): PROBNP in the last 8760 hours. HbA1C: No results for input(s): HGBA1C in the last 72 hours. CBG: Recent Labs  Lab 11/25/20 1116  GLUCAP 152*   Lipid Profile: No results for input(s): CHOL, HDL, LDLCALC, TRIG, CHOLHDL, LDLDIRECT in the last 72 hours. Thyroid Function Tests: No results for input(s): TSH, T4TOTAL, FREET4, T3FREE, THYROIDAB in the last 72 hours. Anemia Panel: No results for input(s): VITAMINB12, FOLATE, FERRITIN, TIBC, IRON, RETICCTPCT in the last 72 hours. Sepsis Labs: Recent Labs  Lab 11/25/20 0739  PROCALCITON <0.10  LATICACIDVEN 1.7    Recent Results (from the past 240 hour(s))  Resp Panel by RT-PCR (Flu A&B, Covid) Nasopharyngeal Swab     Status: None   Collection Time: 11/20/20  9:56 PM   Specimen: Nasopharyngeal Swab; Nasopharyngeal(NP)  swabs in vial transport medium  Result Value Ref Range Status   SARS Coronavirus 2 by RT PCR NEGATIVE NEGATIVE Final    Comment: (NOTE) SARS-CoV-2 target nucleic acids are NOT DETECTED.  The SARS-CoV-2 RNA is generally detectable in upper respiratory specimens during the acute phase of infection. The lowest concentration of SARS-CoV-2 viral copies this assay can detect is 138 copies/mL. A negative result does not preclude SARS-Cov-2 infection and should not be used as the sole basis for treatment or other patient management decisions. A negative result may occur with  improper specimen collection/handling, submission of specimen other than nasopharyngeal swab, presence of viral mutation(s) within the areas targeted by this assay, and inadequate number of viral copies(<138 copies/mL). A negative result must be combined with clinical observations, patient history, and epidemiological information. The expected result is Negative.  Fact Sheet for Patients:   EntrepreneurPulse.com.au  Fact Sheet for Healthcare Providers:  IncredibleEmployment.be  This test is no t yet approved or cleared by the [place] FDA and  has been authorized for detection and/or diagnosis of SARS-CoV-2 by FDA under an Emergency Use Authorization (EUA). This EUA will remain  in effect (meaning this test can be used) for the duration of the COVID-19 declaration under Section 564(b)(1) of the Act, 21 U.S.C.section 360bbb-3(b)(1), unless the authorization is terminated  or revoked sooner.       Influenza A by PCR NEGATIVE NEGATIVE Final   Influenza B by PCR NEGATIVE NEGATIVE Final    Comment: (NOTE) The Xpert Xpress SARS-CoV-2/FLU/RSV plus assay is intended as an aid in the diagnosis of influenza from Nasopharyngeal swab specimens and should not be used as a sole basis for treatment. Nasal washings and aspirates are unacceptable for Xpert Xpress SARS-CoV-2/FLU/RSV testing.  Fact Sheet for Patients: EntrepreneurPulse.com.au  Fact Sheet for Healthcare Providers: IncredibleEmployment.be  This test is not yet approved or cleared by the [place] FDA and has been authorized for detection and/or diagnosis of SARS-CoV-2 by FDA under an Emergency Use Authorization (EUA). This EUA will remain in effect (meaning this test can be used) for the duration of the COVID-19 declaration under Section 564(b)(1) of the Act, 21 U.S.C. section 360bbb-3(b)(1), unless the authorization is terminated or revoked.  Performed at [facility], [address], [place], [place] [address]   MRSA Next Gen by PCR, Nasal     Status: None   Collection Time: 11/24/20  9:25 AM   Specimen: Nasal Mucosa; Nasal Swab  Result Value Ref Range Status   MRSA by PCR Next Gen NOT DETECTED NOT DETECTED Final    Comment: (NOTE) The GeneXpert MRSA Assay (FDA approved for NASAL specimens only), is one component of a  comprehensive MRSA colonization surveillance program. It is not intended to diagnose MRSA infection nor to guide or monitor treatment for MRSA infections. Test performance is not FDA approved in patients less than 74 years old. Performed at [facility], [address] [place], [place] [address]          Radiology Studies: DG CHEST PORT 1 VIEW  Result Date: 11/24/2020 CLINICAL DATA:  80 year old female with acute shortness of breath. EXAM: PORTABLE CHEST 1 VIEW COMPARISON:  11/20/2020 FINDINGS: UPPER limits normal heart size again noted. Increased interstitial opacities are identified without focal airspace disease, pleural effusion, pneumothorax or pulmonary mass. No acute bony abnormalities are noted. IMPRESSION: Increased interstitial opacities, question interstitial edema or infection. Electronically Signed   By: Margarette Canada M.D.   On: 11/24/2020 10:26  Scheduled Meds:  apixaban  5 mg Oral BID   azelastine  1 spray Each Nare BID   budesonide (PULMICORT) nebulizer solution  0.25 mg Nebulization BID   Chlorhexidine Gluconate Cloth  6 each Topical Daily   diltiazem  30 mg Oral Q8H   guaiFENesin-dextromethorphan  10 mL Oral Q6H   influenza vaccine adjuvanted  0.5 mL Intramuscular Tomorrow-1000   ipratropium  0.5 mg Nebulization TID   lactobacillus  1 g Oral TID WC   levalbuterol  1.25 mg Nebulization TID   melatonin  6 mg Oral QHS   methylPREDNISolone (SOLU-MEDROL) injection  40 mg Intravenous Q6H   pantoprazole  40 mg Oral BID AC   propranolol  40 mg Oral BID   sodium chloride  2 spray Each Nare BID   Vibegron  1 tablet Oral Daily   Continuous Infusions:  cefTRIAXone (ROCEPHIN)  IV Stopped (11/25/20 0900)     LOS: 5 days   Time spent= 35 mins    Karl Erway Arsenio Loader, MD [facility]  If 7PM-7AM, please contact night-coverage  11/26/2020, 8:13 AM

## 2020-11-26 NOTE — Progress Notes (Signed)
5 beat run of Vtach. VSS -uploaded in chart. Pt denies cp, sob or any other changes. Attending notified. No further interventions or orders at this time. Will continue to monitor.

## 2020-11-26 NOTE — Progress Notes (Signed)
Subjective: Feeling better today. Denies shortness of breath. Currently on 4L Monique Robles stating 98-99%. Nursing staff planning to decrease to 2L. Reports transitioning to recliner today went much better today. She had significant decline in O2 saturation yesterday with physical therapy and seemed to briefly pass out. No pre-syncope/syncope with transfer today. BP remains soft. No trouble with D3 diet. Tolerating this well. Denies nausea, vomiting, reflux symptoms, abdominal pain, brbpr, or melena. Last BM was yesterday.   Objective: Vital signs in last 24 hours: Temp:  [97.6 F (36.4 C)-98.4 F (36.9 C)] 97.6 F (36.4 C) (09/19 0721) Pulse Rate:  [55-93] 76 (09/19 0721) Resp:  [17-39] 22 (09/19 0721) BP: (83-129)/(49-83) 97/62 (09/19 0700) SpO2:  [88 %-100 %] 93 % (09/19 0727) Weight:  [83.2 kg] 83.2 kg (09/19 0456) Last BM Date: 11/25/20 General:   Alert and oriented, pleasant, NAD. Nasal canula in place on 4L.  Head:  Normocephalic and atraumatic. Eyes:  No icterus, sclera clear. Conjuctiva pink.  Abdomen:  Bowel sounds present, soft, non-tender, non-distended. No HSM or hernias noted. No rebound or guarding. No masses appreciated  Msk:  Symmetrical without gross deformities. Normal posture. Extremities:  Without edema. Neurologic:  Alert and  oriented x4;  grossly normal neurologically. Skin:  Warm and dry, intact without significant lesions.  Psych: Normal mood and affect.  Intake/Output from previous day: 09/18 0701 - 09/19 0700 In: 304.2 [P.O.:300; I.V.:4.2] Out: 1151 [Urine:1150; Stool:1] Intake/Output this shift: No intake/output data recorded.  Lab Results: Recent Labs    11/24/20 0525 11/25/20 0328 11/26/20 0420  WBC 11.2* 10.9* 10.8*  HGB 8.8* 9.5* 9.7*  HCT 29.2* 31.8* 31.7*  PLT 365 374 429*   BMET Recent Labs    11/24/20 1529 11/25/20 0328 11/26/20 0420  NA 136 139 136  K 3.8 4.1 4.0  CL 103 105 103  CO2 26 22 27   GLUCOSE 178* 143* 136*  BUN 39* 36*  44*  CREATININE 0.81 0.68 0.74  CALCIUM 7.8* 8.1* 8.1*    Studies/Results: DG CHEST PORT 1 VIEW  Result Date: 11/24/2020 CLINICAL DATA:  80 year old female with acute shortness of breath. EXAM: PORTABLE CHEST 1 VIEW COMPARISON:  11/20/2020 FINDINGS: UPPER limits normal heart size again noted. Increased interstitial opacities are identified without focal airspace disease, pleural effusion, pneumothorax or pulmonary mass. No acute bony abnormalities are noted. IMPRESSION: Increased interstitial opacities, question interstitial edema or infection. Electronically Signed   By: Margarette Canada M.D.   On: 11/24/2020 10:26    Assessment: 80 y.o. female who previously lived in [place] and moved to our region a few years ago with medical history significant for essential tremor, prior CVA, atrial fibrillation on Eliquis, HTN, prior PE, GERD, chronic dysphagia who was admitted evening of 9/13 for acute on chronic respiratory failure in the setting of acute on chronic interstitial lung disease, pneumonia, possible drug toxicity from chronic nitrofurantoin.  Due to reports of dysphagia and abnormal esophagus on CTA chest this admission, GI was consulted for further evaluation.  Regarding acute on chronic respiratory failure, she is being treated with antibiotics, IV steroids, mucolytic's, and diuretics.  She has required BiPAP this admission.  She had a episode of decompensation yesterday after physical therapy, O2 saturations dropped to 75%.  She also went into A. fib with RVR on 9/17, improved with Cardizem drip and switch to p.o. Cardizem on 9/18. Her white count is improving, remains afebrile, BP remains soft today. Clinically improving. Reports SOB improved today. Currently on 4L [place],  but planning to decrease to 2L today.   Dysphagia: Patient reported chronic issues of dysphagia with sensation of food getting stuck in her substernal region, frequent coughing, and occasional regurgitation.  Chronically on omeprazole  once daily which controls GERD well, currently on PPI BID. CTA chest 9/15 with mild dilation of the proximal and mid esophagus and questionable mild thickening in the mid/distal esophagus. Speech evaluation yesterday with no evidence of aspiration. She is tolerating dysphagia 3 diet well without symptoms. Hospitalist recommended barium swallow today which is reasonable. Ultimately, she will need EGD +/- dilation for further evaluation of her symptoms and CT abnormalities. Eliquis was placed on hold today, transitioned to Lovenox. We can tentatively plan on EGD 11/28/20.    IDA: Hemoglobin 9.7 on admission, down from 12.3 in June 2022.  Iron panel consistent with IDA and patient was heme positive.  BUN also elevated this admission.  Encouragingly, hemoglobin has remained stable, 9.7 today, and no overt GI bleeding this admission per nursing.  Patient reports EGD many years ago and believes her last colonoscopy within the last 10 years in [place], reported to be normal.  She had an EGD this admission for dysphagia and esophageal abnormalities on CT as discussed above.  Ultimately, would recommend repeating a colonoscopy as well, likely on an outpatient basis as long as hemoglobin remains stable.  Plan: Agree with barium swallow today per hospitalist. Recommend double contrast to include barium pill as well.  Tentatively plan for EGD with possible esophageal dilation on 9/21. Eliquis placed on hold and transitioned to Lovenox today per hospitalist in preparation for possible EGD this admission. Continue PPI twice daily. Continue to monitor H&H and for overt GI bleeding. Patient will need likely need colonoscopy for further evaluation of IDA, likely outpatient as long as hemoglobin remains stable and she continues without overt GI bleeding. She will need iron supplementation as outpatient.     LOS: 5 days    11/26/2020, 7:43 AM   Aliene Altes, PA-C [facility]

## 2020-11-26 NOTE — Progress Notes (Signed)
SLP Cancellation Note  Patient Details Name: Monique Robles MRN: [id number] DOB: 03-13-1940   Cancelled treatment:       Reason Eval/Treat Not Completed: Other (comment); Pt had BPE today and report was reviewed. No concerns for oropharyngeal dysphagia at bedside. GI is following. SLP will sign off. Reconsult if indicated.  Thank you,  Genene Churn, [place]    [place] 11/26/2020, 3:57 PM

## 2020-11-26 NOTE — Progress Notes (Signed)
[facility]   Patient name: Monique Robles Admit date: 11/20/2020  DOB: [birth year] LOS: 5  MRN: [id number] Consult date: 11/22/2020  Referring provider: Dr. Roger Shelter, [facility]: Short of breath    History:  80 yo female never smoker  with history of recurrent UTIs on chronic nitrofurantoin therapy since January 2022 started developed dyspnea from April 2022.  She had chest xray in May 2022 that showed interstitial thickening.    She presented to [facility] ER with dyspnea and intermittent headache for several day, and this was associated with leg swelling.  SpO2 85% on room air.  Was using albuterol nebulizer more frequently.  Having productive cough.  She moved from [place] to [place] about 2 years ago to live with her daughter after she had a stroke and her husband passed away.  She had pulmonary assessment there which included lung biopsy in the early 2000's for pneumonia.  She doesn't recall what the diagnosis was or what therapy she had.  She had pneumonia again a few years ago and was told she should use nebulizer medication.  She does not have history of smoking and was never told she has asthma.  She was not using supplemental oxygen prior to this admission.  She had recent cardiology assessment that was unrevealing.  Her CT chest report from 2018 was unremarkable.  Past medical history:  A fib, Diastolic CHF, GERD, PE, Pneumonia, CVA, HTN, MVR, Osteoporosis, Tremor, Vit D deficiency, Overactive bladder  Significant events:  9/13 Admit 9/14 start solumedrol 9/16 needing to use ventimask  Studies:  CT chest 02/23/17 >> post operative changes Rt lung, 5 mm nodule RUL Echo 09/14/20 >> EF 65 to 70%, grade 1 DD, mild LA dilation, mild MR, mild AS CT angio chest 11/22/20 >> prominent LN, diffuse patchy b/l GGO air space disease, mild esophageal dilation Dg ES  9/19 >>>  Micro:  COVID/Flu 9/13 >> negative  Lines:      Antibiotics:  Rocephin 9/13 x1  Zithromax 9/14 >>  Rocephin 9/16 >>   Consults:       Scheduled Meds:  apixaban  5 mg Oral BID   azelastine  1 spray Each Nare BID   budesonide (PULMICORT) nebulizer solution  0.25 mg Nebulization BID   Chlorhexidine Gluconate Cloth  6 each Topical Daily   diltiazem  30 mg Oral Q8H   guaiFENesin-dextromethorphan  10 mL Oral Q6H   influenza vaccine adjuvanted  0.5 mL Intramuscular Tomorrow-1000   ipratropium  0.5 mg Nebulization TID   lactobacillus  1 g Oral TID WC   levalbuterol  1.25 mg Nebulization TID   melatonin  6 mg Oral QHS   methylPREDNISolone (SOLU-MEDROL) injection  40 mg Intravenous Q6H   pantoprazole  40 mg Oral BID AC   propranolol  40 mg Oral BID   sodium chloride  2 spray Each Nare BID   Vibegron  1 tablet Oral Daily   Continuous Infusions:  cefTRIAXone (ROCEPHIN)  IV Stopped (11/25/20 0900)   PRN Meds:.acetaminophen, diphenhydrAMINE, hydrALAZINE, metoprolol tartrate, metoprolol tartrate, ondansetron, senna-docusate, sodium chloride  Interim history:   Did not require any bipap overnight  Vital signs:  BP 97/62 (BP Location: Right Arm)   Pulse 76   Temp 97.6 F (36.4 C) (Oral)   Resp (!) 22   Ht '5\' 1"'$  (1.549 m)   Wt 83.2 kg   SpO2 93%   BMI 34.66 kg/m   Intake/output:  I/O last 3 completed shifts: In:  579.2 [P.O.:480; I.V.:99.2] Out: 1651 [Urine:1650; Stool:1]   Physical exam:   T max = 98.4   General appearance:    elderly wf sitting in chair nad   At Rest 02 sats  94% on 4lpm   No jvd Oropharynx clear,  mucosa nl Neck supple Lungs with a few scattered exp > insp rhonchi bilaterally IRIR at 95 no s3 or or sign murmur Abd soft with nl excursion  Extr warm with no edema or clubbing noted Neuro  Sensorium appears intact,  no apparent motor deficits     I personally reviewed images and agree with radiology impression as follows:  CXR:  portable 9/19 No change ild     Best practice:   DVT -  eliquis SUP - protonix Nutrition - heart healthy Mobility - as tolerated   Assessment/plan:   Acute hypoxic respiratory failure with concern for nitrofurantoin pulmonary toxicity.    Lab Results  Component Value Date   ESRSEDRATE 35 (H) 11/25/2020   ESRSEDRATE 45 (H) 11/23/2020  ANA/ RA neg 9/16    - Obviously  would not give her nitrofurantoin again - wean solumedrol off and change to slow pred taper x 2 weeks then f/u with Halford Chessman or Lucinda Spells in office  - f/u ANA, ANCA from 11/23/20 =  - adjust oxygen to keep SpO2 > 90%  - negative fluid balance as tolerated -  no indication for abx from my perspective nor for bronchodilators as this is not Asthma or copd  - if wheezing at all the first step would be to change beta blocker to bisoprolol, much more selective and less likely to give spillover B2 blockade  Dysphagia. - Dg es pending   Rhinitis. - continue azelastine, nasal irrigation  Hx of A fib, PE. - she is on eliquis      Labs:   CMP Latest Ref Rng & Units 11/26/2020 11/25/2020 11/24/2020  Glucose 70 - 99 mg/dL 136(H) 143(H) 178(H)  BUN 8 - 23 mg/dL 44(H) 36(H) 39(H)  Creatinine 0.44 - 1.00 mg/dL 0.74 0.68 0.81  Sodium 135 - 145 mmol/L 136 139 136  Potassium 3.5 - 5.1 mmol/L 4.0 4.1 3.8  Chloride 98 - 111 mmol/L 103 105 103  CO2 22 - 32 mmol/L '27 22 26  '$ Calcium 8.9 - 10.3 mg/dL 8.1(L) 8.1(L) 7.8(L)  Total Protein 6.5 - 8.1 g/dL - - -  Total Bilirubin 0.3 - 1.2 mg/dL - - -  Alkaline Phos 38 - 126 U/L - - -  AST 15 - 41 U/L - - -  ALT 0 - 44 U/L - - -    CBC Latest Ref Rng & Units 11/26/2020 11/25/2020 11/24/2020  WBC 4.0 - 10.5 K/uL 10.8(H) 10.9(H) 11.2(H)  Hemoglobin 12.0 - 15.0 g/dL 9.7(L) 9.5(L) 8.8(L)  Hematocrit 36.0 - 46.0 % 31.7(L) 31.8(L) 29.2(L)  Platelets 150 - 400 K/uL 429(H) 374 365    ABG    Component Value Date/Time   PHART 7.452 (H) 11/24/2020 0905   PCO2ART 36.3 11/24/2020 0905   PO2ART 57.6 (L) 11/24/2020 0905   HCO3 25.7 11/24/2020 0905    ACIDBASEDEF 3.2 (H) 11/23/2020 0945   O2SAT 88.1 11/24/2020 0905      Pulmonary/ccm f/u is prn    Christinia Gully, MD [facility] [address] [phone]   After 7:00 pm call Elink  [phone]

## 2020-11-26 NOTE — Progress Notes (Signed)
Patient is currently on 2L [place] HFNC with an O2 sat of 96%.  Bipap is at bedside if needed, but no distress noted at this time.

## 2020-11-26 NOTE — Progress Notes (Signed)
Physical Therapy Treatment Patient Details Name: Monique Robles MRN: [id number] DOB: June 14, 1940 Today's Date: 11/26/2020   History of Present Illness Monique Robles is a 80 y.o. female with medical history significant for essential tremor, prior CVA, GERD, overactive bladder who presents to the emergency department by daughter due to low oxygen saturation at home today.  Patient has been having shortness of breath worse on exertion for several months, she was referred to cardiologist who cleared her after negative cardiac work-up, she was asked to go back to her PCP, so she could be referred to a pulmonologist.  Shortness of breath got worse over the weekend (9/9) whereby she appears to be in respiratory distress on minimal exertion and today, O2 sat was noted to be a 75% on room air with home pulse oximeter.  She complained of some productive cough but denies fever, chills, runny nose or nasal congestion.  Per daughter at bedside, patient just moved to this area about 2 years ago, prior to this, she underwent an intensive work-up at [place] whereby lung biopsy was done but no pathology was noted at that time.  She was on oxygen for some time, but she has been oxygen free dependent for at least 5 to 10 years per daughter.    PT Comments    Patient presents up in chair (assisted by nursing staff) and agreeable for therapy.  Patient demonstrates fair/good return for completing BLE ROM/strengthening exercises with verbal cues while seated in chair, increased time and labored movement for completing sit to stands and limited to a few steps at bedside due to fatigue and c/o mild lightheadedness.  Patient BP at 112/82 after activity - nursing staff notified.  Patient will benefit from continued physical therapy in hospital and recommended venue below to increase strength, balance, endurance for safe ADLs and gait.     Recommendations for follow up therapy are one component of a multi-disciplinary discharge  planning process, led by the attending physician.  Recommendations may be updated based on patient status, additional functional criteria and insurance authorization.  Follow Up Recommendations  SNF     Equipment Recommendations  None recommended by PT    Recommendations for Other Services       Precautions / Restrictions Precautions Precautions: Fall Restrictions Weight Bearing Restrictions: No     Mobility  Bed Mobility               General bed mobility comments: Patient presents in chair (assisted by nursing staff)    Transfers Overall transfer level: Needs assistance Equipment used: Rolling walker (2 wheeled) Transfers: Sit to/from Stand Sit to Stand: Min assist         General transfer comment: slightly labored movement  Ambulation/Gait Ambulation/Gait assistance: Mod assist Gait Distance (Feet): 6 Feet Assistive device: Rolling walker (2 wheeled) Gait Pattern/deviations: Decreased step length - right;Decreased step length - left;Decreased stride length Gait velocity: decreased   General Gait Details: limited to a few steps forward/backwards before having to sit due to fatigue and mild lightheadeness   Stairs             Wheelchair Mobility    Modified Rankin (Stroke Patients Only)       Balance Overall balance assessment: Needs assistance Sitting-balance support: Feet supported;No upper extremity supported Sitting balance-Leahy Scale: Fair Sitting balance - Comments: fair/good seated in chair   Standing balance support: During functional activity;No upper extremity supported Standing balance-Leahy Scale: Fair Standing balance comment: using RW  Cognition Arousal/Alertness: Awake/alert Behavior During Therapy: WFL for tasks assessed/performed Overall Cognitive Status: Within Functional Limits for tasks assessed                                        Exercises General  Exercises - Lower Extremity Ankle Circles/Pumps: Seated;AROM;Strengthening;Both;10 reps Long Arc Quad: Seated;AROM;Strengthening;Both;10 reps Hip Flexion/Marching: Seated;AROM;Strengthening;Both;10 reps    General Comments        Pertinent Vitals/Pain Pain Assessment: No/denies pain    Home Living                      Prior Function            PT Goals (current goals can now be found in the care plan section) Acute Rehab PT Goals Patient Stated Goal: return home after rehab PT Goal Formulation: With patient/family Time For Goal Achievement: 12/09/20 Potential to Achieve Goals: Good Progress towards PT goals: Progressing toward goals    Frequency    Min 3X/week      PT Plan Current plan remains appropriate    Co-evaluation              AM-PAC PT "6 Clicks" Mobility   Outcome Measure  Help needed turning from your back to your side while in a flat bed without using bedrails?: A Little Help needed moving from lying on your back to sitting on the side of a flat bed without using bedrails?: A Little Help needed moving to and from a bed to a chair (including a wheelchair)?: A Lot Help needed standing up from a chair using your arms (e.g., wheelchair or bedside chair)?: A Lot Help needed to walk in hospital room?: A Lot Help needed climbing 3-5 steps with a railing? : A Lot 6 Click Score: 14    End of Session Equipment Utilized During Treatment: Oxygen Activity Tolerance: Patient tolerated treatment well;Patient limited by fatigue Patient left: in chair;with call bell/phone within reach;with family/visitor present Nurse Communication: Mobility status PT Visit Diagnosis: Unsteadiness on feet (R26.81);Other abnormalities of gait and mobility (R26.89);Muscle weakness (generalized) (M62.81)     Time: [id number] PT Time Calculation (min) (ACUTE ONLY): 23 min  Charges:  $Therapeutic Exercise: 8-22 mins $Therapeutic Activity: 8-22 mins                      11:34 AM, 11/26/20 Lonell Grandchild, MPT Physical Therapist with [facility] [phone] office [phone] mobile phone

## 2020-11-27 LAB — BASIC METABOLIC PANEL
Anion gap: 6 (ref 5–15)
BUN: 39 mg/dL — ABNORMAL HIGH (ref 8–23)
CO2: 27 mmol/L (ref 22–32)
Calcium: 8.1 mg/dL — ABNORMAL LOW (ref 8.9–10.3)
Chloride: 103 mmol/L (ref 98–111)
Creatinine, Ser: 0.74 mg/dL (ref 0.44–1.00)
GFR, Estimated: >60 mL/min (ref >60)
Glucose, Bld: 128 mg/dL — ABNORMAL HIGH (ref 70–99)
Potassium: 4.3 mmol/L (ref 3.5–5.1)
Sodium: 136 mmol/L (ref 135–145)

## 2020-11-27 LAB — CBC
HCT: 32.9 % — ABNORMAL LOW (ref 36.0–46.0)
Hemoglobin: 10 g/dL — ABNORMAL LOW (ref 12.0–15.0)
MCH: 27.6 pg (ref 26.0–34.0)
MCHC: 30.4 g/dL (ref 30.0–36.0)
MCV: 90.9 fL (ref 80.0–100.0)
Platelets: 485 10*3/uL — ABNORMAL HIGH (ref 150–400)
RBC: 3.62 MIL/uL — ABNORMAL LOW (ref 3.87–5.11)
RDW: 14.6 % (ref 11.5–15.5)
WBC: 11.1 10*3/uL — ABNORMAL HIGH (ref 4.0–10.5)
nRBC: 0.9 % — ABNORMAL HIGH (ref 0.0–0.2)

## 2020-11-27 LAB — ANCA PROFILE
Anti-MPO Antibodies: <0.2 units (ref 0.0–0.9)
Anti-PR3 Antibodies: <0.2 units (ref 0.0–0.9)
Atypical P-ANCA titer: <1:20 {titer}
C-ANCA: <1:20 {titer}
P-ANCA: <1:20 {titer}

## 2020-11-27 LAB — MAGNESIUM: Magnesium: 2.5 mg/dL — ABNORMAL HIGH (ref 1.7–2.4)

## 2020-11-27 MED ORDER — IPRATROPIUM BROMIDE 0.02 % IN SOLN
0.5000 mg | Freq: Two times a day (BID) | RESPIRATORY_TRACT | Status: DC
  Administered 2020-11-28 – 2020-11-29 (×3): 0.5 mg via RESPIRATORY_TRACT
  Filled 2020-11-27 (×3): qty 2.5

## 2020-11-27 MED ORDER — LEVALBUTEROL HCL 1.25 MG/0.5ML IN NEBU
1.2500 mg | INHALATION_SOLUTION | Freq: Two times a day (BID) | RESPIRATORY_TRACT | Status: DC
  Administered 2020-11-28 – 2020-11-29 (×3): 1.25 mg via RESPIRATORY_TRACT
  Filled 2020-11-27 (×3): qty 0.5

## 2020-11-27 NOTE — Progress Notes (Addendum)
Subjective: No complaints for me today. Breathing is improving.  Currently on 2 L nasal cannula with O2 saturations 93%.  Denies cough.  Continues to tolerate D3 diet well.  Denies nausea, vomiting, abdominal pain.  No BM yesterday, but does not feel she needs anything for this.   Objective: Vital signs in last 24 hours: Temp:  [97.6 F (36.4 C)-98.1 F (36.7 C)] 98.1 F (36.7 C) (09/20 0721) Pulse Rate:  [43-108] 43 (09/20 0830) Resp:  [19-31] 19 (09/20 0830) BP: (89-130)/(54-87) 99/66 (09/20 0800) SpO2:  [94 %-100 %] 97 % (09/20 0830) Weight:  [84.4 kg] 84.4 kg (09/20 0456) Last BM Date: 11/25/20 General:   Alert and oriented, pleasant, no acute distress, nasal canula in place on 2L. Head:  Normocephalic and atraumatic. Eyes:  No icterus, sclera clear. Conjuctiva pink.  Abdomen:  Bowel sounds present, soft, non-tender, non-distended. No HSM or hernias noted. No rebound or guarding. No masses appreciated  Msk:  Symmetrical without gross deformities. Normal posture. Extremities:  Without edema. Neurologic:  Alert and  oriented x4;  grossly normal neurologically. Psych:  Normal mood and affect.  Intake/Output from previous day: 09/19 0701 - 09/20 0700 In: 350 [P.O.:350] Out: 850 [Urine:850] Intake/Output this shift: No intake/output data recorded.  Lab Results: Recent Labs    11/25/20 0328 11/26/20 0420 11/27/20 0438  WBC 10.9* 10.8* 11.1*  HGB 9.5* 9.7* 10.0*  HCT 31.8* 31.7* 32.9*  PLT 374 429* 485*   BMET Recent Labs    11/25/20 0328 11/26/20 0420 11/27/20 0438  NA 139 136 136  K 4.1 4.0 4.3  CL 105 103 103  CO2 22 27 27   GLUCOSE 143* 136* 128*  BUN 36* 44* 39*  CREATININE 0.68 0.74 0.74  CALCIUM 8.1* 8.1* 8.1*    Studies/Results: DG Chest Port 1 View  Result Date: 11/26/2020 CLINICAL DATA:  Shortness of breath. EXAM: PORTABLE CHEST 1 VIEW COMPARISON:  November 24, 2020. FINDINGS: The heart size and mediastinal contours are within normal limits.  Stable interstitial densities are noted throughout both lungs which may represent scarring, atypical inflammation or edema. The visualized skeletal structures are unremarkable. IMPRESSION: Stable interstitial densities are noted as described above. Electronically Signed   By: Marijo Conception M.D.   On: 11/26/2020 11:31   DG ESOPHAGUS W SINGLE CM (SOL OR THIN BA)  Result Date: 11/26/2020 CLINICAL DATA:  Abnormal dilated esophagus on CT angio chest exam EXAM: ESOPHOGRAM/BARIUM SWALLOW TECHNIQUE: Single contrast examination was performed using thin barium. Patient also swallowed a 12.5 mm diameter barium tablet. FLUOROSCOPY TIME:  Fluoroscopy Time:  1 minute 36 seconds Radiation Exposure Index (if provided by the fluoroscopic device): 38.7 mGy Number of Acquired Spot Images: multiple fluoroscopic screen captures COMPARISON:  CT angio chest 11/22/2020 FINDINGS: Somewhat dilated esophagus throughout its length. Small hiatal hernia. Diffusely impaired esophageal motility with incomplete clearance of barium by primary peristaltic waves and note of scattered secondary and tertiary waves. No evidence of esophageal mass, stricture, or wall irregularity. 12.5 mm diameter barium tablet passed from oral cavity to stomach without obstruction. Atherosclerotic calcifications at aortic arch. Bones appear demineralized. No laryngeal penetration or aspiration of contrast were grossly noted on nondedicated exam. IMPRESSION: Diffuse esophageal dysmotility. No evidence of esophageal mass or obstruction. Hiatal hernia. Electronically Signed   By: Lavonia Dana M.D.   On: 11/26/2020 14:31    Assessment: 80 y.o. female who previously lived in [place] and moved to our region a few years ago with medical history  significant for essential tremor, prior CVA, atrial fibrillation on Eliquis,  prior PE, HTN, GERD, chronic dysphagia who was admitted evening of 9/13 for acute respiratory failure in the setting of acute on chronic interstitial lung  disease, pneumonia, possible drug toxicity from chronic nitrofurantoin.  Due to reports of dysphagia and abnormal esophagus on CTA chest this admission, GI was consulted for further evaluation.   Regarding acute on chronic respiratory failure, she is being treated with antibiotics, IV steroids, mucolytic's, and diuretics.  She is clinically improving.  Currently on 2 L nasal cannula with O2 saturations of 93%, down from 4 L nasal cannula yesterday.  WBC count improving, remains afebrile, BP remains soft.  Notably, she also developed A. fib with RVR on 9/17, improved with Cardizem drip and switch to p.o. Cardizem on 9/18.  Heart rate remains controlled.  Dysphagia: Patient reported chronic issues of dysphagia with sensation of food getting stuck in her substernal region, frequent coughing, and occasional regurgitation.  Chronically on omeprazole once daily which controls GERD well, currently on PPI BID. CTA chest 9/15 with mild dilation of the proximal and mid esophagus and questionable mild thickening in the mid/distal esophagus.  Speech evaluation 9/18 with no evidence of aspiration.  Barium swallow yesterday with diffuse esophageal dysmotility, no esophageal mass, stricture, or wall irregularity, barium tablet passed without obstruction, small hiatal hernia.  Currently, she is tolerating dysphagia 3 diet well without symptoms.  Discussed proceeding with EGD tomorrow as previously planned to further evaluate questionable esophageal wall thickening on CTA chest and provide empiric dilation as this may provide some benefit despite underlying dysmotility.  Patient and her daughter would like to proceed with EGD.  If she were to continue with any significant dysphagia symptoms, would need to consider esophageal manometry.  IDA: Hemoglobin 9.7 on admission, down from 12.3 in June 2022.  Iron panel consistent with IDA and patient was heme positive.  BUN also elevated this admission.  Encouragingly, hemoglobin has  remained stable, 10.0 today, and no overt GI bleeding this admission. Patient reports EGD many years ago and believes her last colonoscopy within the last 10 years in [place], reported to be normal.  Plans for EGD this admission for dysphagia and esophageal abnormalities on CT as discussed above.  Ultimately, would recommend repeating a colonoscopy as well, likely on an outpatient basis as long as hemoglobin remains stable.  Plan: Tentatively plan for EGD with propofol with possible esophageal dilation and possible esophageal biopsies with Dr. Laural Golden on 9/21 as long as respiratory status remains stable overnight.  Last dose of Eliquis evening of 9/18. Started Lovenox 9/19.  We will start holding Lovenox this evening in preparation for EGD tomorrow. Continue PPI twice daily. Continue to monitor H&H and for overt GI bleeding. Patient will likely need colonoscopy for further evaluation of IDA as outpatient as long as hemoglobin remains stable and she continues without overt GI bleeding. She will need to start iron supplementation at discharge.   LOS: 6 days    11/27/2020, 9:28 AM   Aliene Altes, PA-C [facility]

## 2020-11-27 NOTE — NC FL2 (Signed)
[place] MEDICAID FL2 LEVEL OF CARE SCREENING TOOL     IDENTIFICATION  Patient Name: Monique Robles Birthdate: 1940/09/17 Sex: female Admission Date (Current Location): 11/20/2020  [facility] and [place] Number:  [employer] and Address:  [place] [address], [place]      Provider Number: [id number]  Attending Physician Name and Address:  Damita Lack, MD  Relative Name and Phone Number:  Chapmon,[name] (Daughter)   [phone]    Current Level of Care: Hospital Recommended Level of Care: [place] Prior Approval Number:    Date Approved/Denied: 11/27/20 PASRR Number: [id number]  Discharge Plan: SNF    Current Diagnoses: Patient Active Problem List   Diagnosis Date Noted   Esophageal dysphagia    Chronic lung disease    Drug-induced pneumonitis    Iron deficiency anemia due to chronic blood loss 11/23/2020   Acute respiratory failure with hypoxia ([place]) 11/21/2020   GERD (gastroesophageal reflux disease) 11/21/2020   Bronchitis 11/21/2020   Obesity (BMI 30-39.9) 11/21/2020   Insomnia 11/21/2020   Acute respiratory failure ([place]) 11/21/2020   Overactive bladder 08/24/2020   Acute cystitis without hematuria 08/24/2020   Chronic cystitis with hematuria 08/24/2020   Essential tremor 08/15/2019   Cerebrovascular accident (CVA) ([place]) 08/15/2019   Gait abnormality 08/15/2019    Orientation RESPIRATION BLADDER Height & Weight     Self, Time, Situation, Place  O2 (2L) Continent Weight: 186 lb 1.1 oz (84.4 kg) Height:  5\' 1"  (154.9 cm)  BEHAVIORAL SYMPTOMS/MOOD NEUROLOGICAL BOWEL NUTRITION STATUS      Continent Diet  AMBULATORY STATUS COMMUNICATION OF NEEDS Skin   Extensive Assist Verbally Other (Comment) (Rash Left Breast)                       Personal Care Assistance Level of Assistance  Bathing, Feeding, Dressing Bathing Assistance: Maximum assistance Feeding assistance:  Independent Dressing Assistance: Maximum assistance     Functional Limitations Info  Sight, Hearing, Speech Sight Info: Adequate Hearing Info: Adequate Speech Info: Adequate    SPECIAL CARE FACTORS FREQUENCY  PT (By licensed PT), OT (By licensed OT)     PT Frequency: 5x OT Frequency: 5x            Contractures Contractures Info: Not present    Additional Factors Info  Code Status, Allergies Code Status Info: Full Allergies Info: Coumadin (warfarin)  Atorvastatin  Ciprofloxacin  Sulfa Antibiotics           Current Medications (11/27/2020):  This is the current hospital active medication list Current Facility-Administered Medications  Medication Dose Route Frequency Provider Last Rate Last Admin   acetaminophen (TYLENOL) tablet 650 mg  650 mg Oral Q6H PRN Shahmehdi, Seyed A, MD   650 mg at 11/26/20 2142   azelastine (ASTELIN) 0.1 % nasal spray 1 spray  1 spray Each Nare BID Skipper Cliche A, MD   1 spray at 11/26/20 0949   budesonide (PULMICORT) nebulizer solution 0.25 mg  0.25 mg Nebulization BID Shahmehdi, Seyed A, MD   0.25 mg at 11/27/20 0744   Chlorhexidine Gluconate Cloth 2 % PADS 6 each  6 each Topical Daily Skipper Cliche A, MD   6 each at 11/27/20 0838   diltiazem (CARDIZEM) tablet 30 mg  30 mg Oral Q8H Shahmehdi, Seyed A, MD   30 mg at 11/27/20 3825   diphenhydrAMINE (BENADRYL) capsule 25 mg  25 mg Oral QHS PRN Zierle-Ghosh, Asia B, DO  25 mg at 11/26/20 2142   enoxaparin (LOVENOX) injection 80 mg  80 mg Subcutaneous Q12H Amin, Ankit Chirag, MD   80 mg at 11/27/20 1049   guaiFENesin-dextromethorphan (ROBITUSSIN DM) 100-10 MG/5ML syrup 10 mL  10 mL Oral Q6H Shahmehdi, Seyed A, MD   10 mL at 11/27/20 5631   hydrALAZINE (APRESOLINE) injection 10 mg  10 mg Intravenous Q4H PRN Amin, Ankit Chirag, MD       influenza vaccine adjuvanted (FLUAD) injection 0.5 mL  0.5 mL Intramuscular Tomorrow-1000 Shahmehdi, Seyed A, MD       ipratropium (ATROVENT) nebulizer solution  0.5 mg  0.5 mg Nebulization TID Shahmehdi, Seyed A, MD   0.5 mg at 11/27/20 0744   lactobacillus (FLORANEX/LACTINEX) granules 1 g  1 g Oral TID WC Shahmehdi, Seyed A, MD   1 g at 11/27/20 0837   levalbuterol (XOPENEX) nebulizer solution 1.25 mg  1.25 mg Nebulization TID Skipper Cliche A, MD   1.25 mg at 11/27/20 0744   melatonin tablet 6 mg  6 mg Oral QHS Shahmehdi, Seyed A, MD   6 mg at 11/26/20 2142   methylPREDNISolone sodium succinate (SOLU-MEDROL) 40 mg/mL injection 40 mg  40 mg Intravenous Q12H Amin, Ankit Chirag, MD   40 mg at 11/27/20 0124   metoprolol tartrate (LOPRESSOR) injection 5 mg  5 mg Intravenous Q5 min PRN Shahmehdi, Seyed A, MD   5 mg at 11/24/20 1520   metoprolol tartrate (LOPRESSOR) injection 5 mg  5 mg Intravenous Q4H PRN Amin, Ankit Chirag, MD       ondansetron (ZOFRAN) tablet 4 mg  4 mg Oral Q8H PRN Shahmehdi, Seyed A, MD   4 mg at 11/26/20 1124   pantoprazole (PROTONIX) EC tablet 40 mg  40 mg Oral BID AC Carver, Charles K, DO   40 mg at 11/27/20 4970   propranolol (INDERAL) tablet 40 mg  40 mg Oral BID Shahmehdi, Seyed A, MD   40 mg at 11/27/20 2637   senna-docusate (Senokot-S) tablet 1 tablet  1 tablet Oral QHS PRN Amin, Ankit Chirag, MD       sodium chloride (OCEAN) 0.65 % nasal spray 1 spray  1 spray Each Nare PRN Shahmehdi, Seyed A, MD   1 spray at 11/23/20 1602   sodium chloride (OCEAN) 0.65 % nasal spray 2 spray  2 spray Each Nare BID Deatra James, MD   2 spray at 11/26/20 0941   Vibegron TABS 1 tablet  1 tablet Oral Daily Deatra James, MD   1 tablet at 11/27/20 8588     Discharge Medications: Please see discharge summary for a list of discharge medications.  Relevant Imaging Results:  Relevant Lab Results:   Additional Information PT SSN [id number]  Natasha Bence, LCSW

## 2020-11-27 NOTE — Progress Notes (Signed)
PROGRESS NOTE    Monique Robles  BZJ:[id number] DOB: 16-Nov-1940 DOA: 11/20/2020 PCP: Celene Squibb, MD   Brief Narrative:  80 year old with history of essential tremor, prior CVA, GERD, overactive bladder came to the hospital for evaluation of hypoxia and progressive shortness of breath.  Patient was diagnosed with likely bronchitis with intermittent fluid overload requiring Lasix.  There is a possibility patient may have drug-related toxicity from nitrofurantoin.  Seen by pulmonary.Hospital stay also complicated by A. fib with RVR started on Cardizem drip. Now she is on Propranolol.    Assessment & Plan:   Principal Problem:   Acute respiratory failure with hypoxia (HCC) Active Problems:   Essential tremor   Cerebrovascular accident (CVA) ([place])   Overactive bladder   GERD (gastroesophageal reflux disease)   Bronchitis   Obesity (BMI 30-39.9)   Insomnia   Acute respiratory failure (HCC)   Iron deficiency anemia due to chronic blood loss   Esophageal dysphagia   Chronic lung disease   Drug-induced pneumonitis   Acute respiratory failure with hypoxia likely from nitrofurantoin pulmonary toxicity -Improving, on 2L [place] at this morning. Cont Neg fluid balance.  -CT of the chest was reviewed, chronic interstitial lung disease identified, negative for pulmonary embolism, possible multifocal pneumonia -Cont IV Solu-Medrol 40 mg every 12 hours, taper off over 2 weeks and follow-up outpatient pulmonary. -Bronchodilators/I-S/flutter. Out of bed to chair.   A. fib with RVR -history of A. fib -Resolved for now. Cont propranolol. Lovenox 1mg /kg 12 hrs in anticipation of EGD, hold it tonight. Holding Eliquis for now.   Dysphagia - Barium esophagram shows esophageal motility disorder.  GI team is following, plans for EGD tomorrow. PPI BID.    Essential tremor -continue propanolol -Stable  History of CVA -Stable, continue Eliquis,  -Not on any statins    GERD Continue  Protonix Stable   Overactive bladder Continue trospium and vibegron Stable    Insomnia Continue melatonin   Obesity (BMI 34.96) Patient will be counseled on diet and lifestyle modification when more stable   Chronic anemia -iron deficiency anemia -Eventually will need outpatient colonoscopy  Transfer ptn to Tele. PT/OT ordered. Out of bed to chair.   DVT prophylaxis: On Eliquis Code Status: Full code Family Communication: Daughter at bedside   Status is: Inpatient  Remains inpatient appropriate because:Inpatient level of care appropriate due to severity of illness. Still having issues with dysphagia. Plans for EGD   Dispo: The patient is from: Home              Anticipated d/c is to: Home              Patient currently is not medically stable to d/c.   Difficult to place patient No       Subjective: No acute events overnight. Breathing is better and on 2L [place]. Still quite weak and frail.  Daughter is at the bedside.   Review of Systems Otherwise negative except as per HPI, including: General = no fevers, chills, dizziness,  fatigue HEENT/EYES = negative for loss of vision, double vision, blurred vision,  sore throa Cardiovascular= negative for chest pain, palpitation Respiratory/lungs= negative for shortness of breath, cough, wheezing; hemoptysis,  Gastrointestinal= negative for nausea, vomiting, abdominal pain Genitourinary= negative for Dysuria MSK = Negative for arthralgia, myalgias Neurology= Negative for headache, numbness, tingling  Psychiatry= Negative for suicidal and homocidal ideation Skin= Negative for Rash   Examination: Constitutional: Not in acute distress, elderly frail. 2L [place] Respiratory: mild b/l rhonchi.  Especially at the bases.  Cardiovascular: Normal sinus rhythm, no rubs Abdomen: Nontender nondistended good bowel sounds Musculoskeletal: No edema noted Skin: No rashes seen Neurologic: CN 2-12 grossly intact.  And nonfocal Psychiatric:  Normal judgment and insight. Alert and oriented x 3. Normal mood.      Objective: Vitals:   11/27/20 0830 11/27/20 0900 11/27/20 1000 11/27/20 1100  BP:  118/86 102/72 (!) 105/59  Pulse: (!) 43 86 79 73  Resp: 19 (!) 25 (!) 25 (!) 26  Temp:      TempSrc:      SpO2: 97% 94% 96% 99%  Weight:      Height:        Intake/Output Summary (Last 24 hours) at 11/27/2020 1149 Last data filed at 11/27/2020 0457 Gross per 24 hour  Intake 350 ml  Output 850 ml  Net -500 ml   Filed Weights   11/24/20 0927 11/26/20 0456 11/27/20 0456  Weight: 84.6 kg 83.2 kg 84.4 kg     Data Reviewed:   CBC: Recent Labs  Lab 11/20/20 2251 11/21/20 0414 11/23/20 0615 11/24/20 0525 11/25/20 0328 11/26/20 0420 11/27/20 0438  WBC 8.4   < > 15.0* 11.2* 10.9* 10.8* 11.1*  NEUTROABS 4.9  --  13.5*  --   --   --   --   HGB 9.7*   < > 8.6* 8.8* 9.5* 9.7* 10.0*  HCT 32.2*   < > 28.8* 29.2* 31.8* 31.7* 32.9*  MCV 92.8   < > 94.7 91.8 91.6 89.5 90.9  PLT 341   < > 363 365 374 429* 485*   < > = values in this interval not displayed.   Basic Metabolic Panel: Recent Labs  Lab 11/21/20 0414 11/23/20 0615 11/24/20 1529 11/25/20 0328 11/26/20 0420 11/27/20 0438  NA 137 139 136 139 136 136  K 4.0 4.3 3.8 4.1 4.0 4.3  CL 109 109 103 105 103 103  CO2 20* 21* 26 22 27 27   GLUCOSE 139* 121* 178* 143* 136* 128*  BUN 17 27* 39* 36* 44* 39*  CREATININE 0.59 0.60 0.81 0.68 0.74 0.74  CALCIUM 8.2* 8.4* 7.8* 8.1* 8.1* 8.1*  MG 2.1  --  2.3  --   --  2.5*  PHOS 3.2  --   --   --   --   --    GFR: Estimated Creatinine Clearance: 57.1 mL/min (by C-G formula based on SCr of 0.74 mg/dL). Liver Function Tests: Recent Labs  Lab 11/21/20 0414  AST 18  ALT 9  ALKPHOS 85  BILITOT 0.5  PROT 6.8  ALBUMIN 3.3*   No results for input(s): LIPASE, AMYLASE in the last 168 hours. No results for input(s): AMMONIA in the last 168 hours. Coagulation Profile: Recent Labs  Lab 11/21/20 0414  INR 1.2   Cardiac  Enzymes: No results for input(s): CKTOTAL, CKMB, CKMBINDEX, TROPONINI in the last 168 hours. BNP (last 3 results) No results for input(s): PROBNP in the last 8760 hours. HbA1C: No results for input(s): HGBA1C in the last 72 hours. CBG: Recent Labs  Lab 11/25/20 1116  GLUCAP 152*   Lipid Profile: No results for input(s): CHOL, HDL, LDLCALC, TRIG, CHOLHDL, LDLDIRECT in the last 72 hours. Thyroid Function Tests: No results for input(s): TSH, T4TOTAL, FREET4, T3FREE, THYROIDAB in the last 72 hours. Anemia Panel: No results for input(s): VITAMINB12, FOLATE, FERRITIN, TIBC, IRON, RETICCTPCT in the last 72 hours. Sepsis Labs: Recent Labs  Lab 11/25/20 [place] <0.10  LATICACIDVEN  1.7    Recent Results (from the past 240 hour(s))  Resp Panel by RT-PCR (Flu A&B, Covid) Nasopharyngeal Swab     Status: None   Collection Time: 11/20/20  9:56 PM   Specimen: Nasopharyngeal Swab; Nasopharyngeal(NP) swabs in vial transport medium  Result Value Ref Range Status   SARS Coronavirus 2 by RT PCR NEGATIVE NEGATIVE Final    Comment: (NOTE) SARS-CoV-2 target nucleic acids are NOT DETECTED.  The SARS-CoV-2 RNA is generally detectable in upper respiratory specimens during the acute phase of infection. The lowest concentration of SARS-CoV-2 viral copies this assay can detect is 138 copies/mL. A negative result does not preclude SARS-Cov-2 infection and should not be used as the sole basis for treatment or other patient management decisions. A negative result may occur with  improper specimen collection/handling, submission of specimen other than nasopharyngeal swab, presence of viral mutation(s) within the areas targeted by this assay, and inadequate number of viral copies(<138 copies/mL). A negative result must be combined with clinical observations, patient history, and epidemiological information. The expected result is Negative.  Fact Sheet for Patients:   EntrepreneurPulse.com.au  Fact Sheet for Healthcare Providers:  IncredibleEmployment.be  This test is no t yet approved or cleared by the [place] FDA and  has been authorized for detection and/or diagnosis of SARS-CoV-2 by FDA under an Emergency Use Authorization (EUA). This EUA will remain  in effect (meaning this test can be used) for the duration of the COVID-19 declaration under Section 564(b)(1) of the Act, 21 U.S.C.section 360bbb-3(b)(1), unless the authorization is terminated  or revoked sooner.       Influenza A by PCR NEGATIVE NEGATIVE Final   Influenza B by PCR NEGATIVE NEGATIVE Final    Comment: (NOTE) The Xpert Xpress SARS-CoV-2/FLU/RSV plus assay is intended as an aid in the diagnosis of influenza from Nasopharyngeal swab specimens and should not be used as a sole basis for treatment. Nasal washings and aspirates are unacceptable for Xpert Xpress SARS-CoV-2/FLU/RSV testing.  Fact Sheet for Patients: EntrepreneurPulse.com.au  Fact Sheet for Healthcare Providers: IncredibleEmployment.be  This test is not yet approved or cleared by the [place] FDA and has been authorized for detection and/or diagnosis of SARS-CoV-2 by FDA under an Emergency Use Authorization (EUA). This EUA will remain in effect (meaning this test can be used) for the duration of the COVID-19 declaration under Section 564(b)(1) of the Act, 21 U.S.C. section 360bbb-3(b)(1), unless the authorization is terminated or revoked.  Performed at [facility], [address], [place], [place] [address]   MRSA Next Gen by PCR, Nasal     Status: None   Collection Time: 11/24/20  9:25 AM   Specimen: Nasal Mucosa; Nasal Swab  Result Value Ref Range Status   MRSA by PCR Next Gen NOT DETECTED NOT DETECTED Final    Comment: (NOTE) The GeneXpert MRSA Assay (FDA approved for NASAL specimens only), is one component of a  comprehensive MRSA colonization surveillance program. It is not intended to diagnose MRSA infection nor to guide or monitor treatment for MRSA infections. Test performance is not FDA approved in patients less than 13 years old. Performed at [facility], [address], [place], [place] [address]          Radiology Studies: [facility] Chest [facility] 1 View  Result Date: 11/26/2020 CLINICAL DATA:  Shortness of breath. EXAM: PORTABLE CHEST 1 VIEW COMPARISON:  November 24, 2020. FINDINGS: The heart size and mediastinal contours are within normal limits. Stable interstitial densities are noted  throughout both lungs which may represent scarring, atypical inflammation or edema. The visualized skeletal structures are unremarkable. IMPRESSION: Stable interstitial densities are noted as described above. Electronically Signed   By: Marijo Conception M.D.   On: 11/26/2020 11:31   DG ESOPHAGUS W SINGLE CM (SOL OR THIN BA)  Result Date: 11/26/2020 CLINICAL DATA:  Abnormal dilated esophagus on CT angio chest exam EXAM: ESOPHOGRAM/BARIUM SWALLOW TECHNIQUE: Single contrast examination was performed using thin barium. Patient also swallowed a 12.5 mm diameter barium tablet. FLUOROSCOPY TIME:  Fluoroscopy Time:  1 minute 36 seconds Radiation Exposure Index (if provided by the fluoroscopic device): 38.7 mGy Number of Acquired Spot Images: multiple fluoroscopic screen captures COMPARISON:  CT angio chest 11/22/2020 FINDINGS: Somewhat dilated esophagus throughout its length. Small hiatal hernia. Diffusely impaired esophageal motility with incomplete clearance of barium by primary peristaltic waves and note of scattered secondary and tertiary waves. No evidence of esophageal mass, stricture, or wall irregularity. 12.5 mm diameter barium tablet passed from oral cavity to stomach without obstruction. Atherosclerotic calcifications at aortic arch. Bones appear demineralized. No laryngeal penetration or aspiration of contrast were  grossly noted on nondedicated exam. IMPRESSION: Diffuse esophageal dysmotility. No evidence of esophageal mass or obstruction. Hiatal hernia. Electronically Signed   By: Lavonia Dana M.D.   On: 11/26/2020 14:31        Scheduled Meds:  azelastine  1 spray Each Nare BID   budesonide (PULMICORT) nebulizer solution  0.25 mg Nebulization BID   Chlorhexidine Gluconate Cloth  6 each Topical Daily   diltiazem  30 mg Oral Q8H   enoxaparin (LOVENOX) injection  80 mg Subcutaneous Q12H   guaiFENesin-dextromethorphan  10 mL Oral Q6H   influenza vaccine adjuvanted  0.5 mL Intramuscular Tomorrow-1000   ipratropium  0.5 mg Nebulization TID   lactobacillus  1 g Oral TID WC   levalbuterol  1.25 mg Nebulization TID   melatonin  6 mg Oral QHS   methylPREDNISolone (SOLU-MEDROL) injection  40 mg Intravenous Q12H   pantoprazole  40 mg Oral BID AC   propranolol  40 mg Oral BID   sodium chloride  2 spray Each Nare BID   Vibegron  1 tablet Oral Daily   Continuous Infusions:     LOS: 6 days   Time spent= 35 mins    Tariah Transue Arsenio Loader, MD [facility]  If 7PM-7AM, please contact night-coverage  11/27/2020, 11:49 AM

## 2020-11-27 NOTE — TOC Initial Note (Addendum)
Transition of Care [facility]) - Initial/Assessment Note    Patient Details  Name: Monique Robles MRN: [id number] Date of Birth: [birth year]  [facility] CM/SW Contact:    Natasha Bence, LCSW Phone Number: 11/27/2020, 11:40 AM  Clinical Narrative:                 Patient is a 80 year old female admitted for  Acute Respiratory failure with hypoxia. CSW conducted initial assessment. Patient's daughter reported that patient utilizes shower chair and walker. Patient can complete most ADL's with minimal assistance at baseline. Patient does require assistance with meals being prepared. Patient's family is agreeable to SNF referral to [facility]. Patient's reference is [employer]. Patient is fully vaccinated for Covid 19. CSW faxed out patient. Patient's family not agreeable to [facility]. TOC to follow.  Expected Discharge Plan: Skilled Nursing Facility Barriers to Discharge: Continued Medical Work up   Patient Goals and CMS Choice Patient states their goals for this hospitalization and ongoing recovery are:: Rehab with SNF CMS Medicare.gov Compare Post Acute Care list provided to:: Patient Choice offered to / list presented to : Patient  Expected Discharge Plan and Services Expected Discharge Plan: [place]       Living arrangements for the past 2 months: Single Family Home                                      Prior Living Arrangements/Services Living arrangements for the past 2 months: Single Family Home Lives with:: Self, Adult Children Patient language and need for interpreter reviewed:: Yes Do you feel safe going back to the place where you live?: Yes      Need for Family Participation in Patient Care: Yes (Comment) Care giver support system in place?: Yes (comment)   Criminal Activity/Legal Involvement Pertinent to Current Situation/Hospitalization: No - Comment as needed  Activities of Daily Living Home Assistive Devices/Equipment: Wheelchair,  Environmental consultant (specify type) ADL Screening (condition at time of admission) Patient's cognitive ability adequate to safely complete daily activities?: Yes Is the patient deaf or have difficulty hearing?: No Does the patient have difficulty seeing, even when wearing glasses/contacts?: No Does the patient have difficulty concentrating, remembering, or making decisions?: No Patient able to express need for assistance with ADLs?: Yes Does the patient have difficulty dressing or bathing?: Yes Independently performs ADLs?: No Communication: Independent Dressing (OT): Needs assistance Is this a change from baseline?: Pre-admission baseline Grooming: Independent Feeding: Independent Bathing: Needs assistance Is this a change from baseline?: Pre-admission baseline Toileting: Needs assistance Is this a change from baseline?: Pre-admission baseline In/Out Bed: Needs assistance Is this a change from baseline?: Pre-admission baseline Walks in Home: Needs assistance Is this a change from baseline?: Pre-admission baseline Does the patient have difficulty walking or climbing stairs?: Yes Weakness of Legs: Left Weakness of Arms/Hands: Left  Permission Sought/Granted Permission sought to share information with : Family Supports Permission granted to share information with : Yes, Verbal Permission Granted  Share Information with NAME: Chapmon,[name]  Permission granted to share info w AGENCY: Local SNF  Permission granted to share info w Relationship: (Daughter)  Permission granted to share info w Contact Information: [phone]  Emotional Assessment     Affect (typically observed): Accepting, Adaptable Orientation: : Oriented to Self, Oriented to Situation, Oriented to Place, Oriented to  Time Alcohol / Substance Use: Not Applicable Psych Involvement: No (comment)  Admission diagnosis:  Acute respiratory failure ([place]) [J96.00] Hypoxia [R09.02] Chronic lung disease [J98.4] Acute respiratory  failure with hypoxia (HCC) [J96.01] Acute bronchitis, unspecified organism [J20.9] Patient Active Problem List   Diagnosis Date Noted   Esophageal dysphagia    Chronic lung disease    Drug-induced pneumonitis    Iron deficiency anemia due to chronic blood loss 11/23/2020    Class: Chronic   Acute respiratory failure with hypoxia ([place]) 11/21/2020   GERD (gastroesophageal reflux disease) 11/21/2020   Bronchitis 11/21/2020   Obesity (BMI 30-39.9) 11/21/2020   Insomnia 11/21/2020   Acute respiratory failure ([place]) 11/21/2020   Overactive bladder 08/24/2020   Acute cystitis without hematuria 08/24/2020   Chronic cystitis with hematuria 08/24/2020   Essential tremor 08/15/2019   Cerebrovascular accident (CVA) ([place]) 08/15/2019   Gait abnormality 08/15/2019   PCP:  Celene Squibb, MD Pharmacy:   [facility], [place] - [address] [place] [address] Phone: [phone] Fax: [phone]     Social Determinants of Health (SDOH) Interventions    Readmission Risk Interventions No flowsheet data found.

## 2020-11-28 ENCOUNTER — Inpatient Hospital Stay (HOSPITAL_COMMUNITY): Payer: Medicare Other | Admitting: Anesthesiology

## 2020-11-28 ENCOUNTER — Encounter (HOSPITAL_COMMUNITY): Payer: Self-pay | Admitting: Family Medicine

## 2020-11-28 ENCOUNTER — Encounter (HOSPITAL_COMMUNITY)
Admission: EM | Disposition: A | Discharge status: Skilled Nursing Facility | Payer: Self-pay | Source: Home / Self Care | Attending: Family Medicine

## 2020-11-28 DIAGNOSIS — K222 Esophageal obstruction: Secondary | ICD-10-CM

## 2020-11-28 DIAGNOSIS — R933 Abnormal findings on diagnostic imaging of other parts of digestive tract: Secondary | ICD-10-CM

## 2020-11-28 DIAGNOSIS — K449 Diaphragmatic hernia without obstruction or gangrene: Secondary | ICD-10-CM

## 2020-11-28 DIAGNOSIS — K2289 Other specified disease of esophagus: Secondary | ICD-10-CM

## 2020-11-28 DIAGNOSIS — R1314 Dysphagia, pharyngoesophageal phase: Secondary | ICD-10-CM

## 2020-11-28 HISTORY — PX: ESOPHAGOGASTRODUODENOSCOPY (EGD) WITH PROPOFOL: SHX5813

## 2020-11-28 HISTORY — PX: BALLOON DILATION: SHX5330

## 2020-11-28 LAB — CBC
HCT: 33 % — ABNORMAL LOW (ref 36.0–46.0)
Hemoglobin: 9.7 g/dL — ABNORMAL LOW (ref 12.0–15.0)
MCH: 26.6 pg (ref 26.0–34.0)
MCHC: 29.4 g/dL — ABNORMAL LOW (ref 30.0–36.0)
MCV: 90.7 fL (ref 80.0–100.0)
Platelets: 475 10*3/uL — ABNORMAL HIGH (ref 150–400)
RBC: 3.64 MIL/uL — ABNORMAL LOW (ref 3.87–5.11)
RDW: 14.5 % (ref 11.5–15.5)
WBC: 10.7 10*3/uL — ABNORMAL HIGH (ref 4.0–10.5)
nRBC: 0.6 % — ABNORMAL HIGH (ref 0.0–0.2)

## 2020-11-28 LAB — BASIC METABOLIC PANEL
Anion gap: 7 (ref 5–15)
BUN: 43 mg/dL — ABNORMAL HIGH (ref 8–23)
CO2: 27 mmol/L (ref 22–32)
Calcium: 8 mg/dL — ABNORMAL LOW (ref 8.9–10.3)
Chloride: 101 mmol/L (ref 98–111)
Creatinine, Ser: 0.84 mg/dL (ref 0.44–1.00)
GFR, Estimated: >60 mL/min (ref >60)
Glucose, Bld: 127 mg/dL — ABNORMAL HIGH (ref 70–99)
Potassium: 4.3 mmol/L (ref 3.5–5.1)
Sodium: 135 mmol/L (ref 135–145)

## 2020-11-28 LAB — MAGNESIUM: Magnesium: 2.5 mg/dL — ABNORMAL HIGH (ref 1.7–2.4)

## 2020-11-28 SURGERY — ESOPHAGOGASTRODUODENOSCOPY (EGD) WITH PROPOFOL
Anesthesia: General

## 2020-11-28 MED ORDER — SODIUM CHLORIDE 0.9 % IV SOLN: INTRAVENOUS | Status: DC

## 2020-11-28 MED ORDER — LACTATED RINGERS IV SOLN
INTRAVENOUS | Status: DC
  Administered 2020-11-28: 1000 mL via INTRAVENOUS

## 2020-11-28 MED ORDER — STERILE WATER FOR IRRIGATION IR SOLN
Status: DC | PRN
  Administered 2020-11-28: 100 mL

## 2020-11-28 MED ORDER — PROPOFOL 10 MG/ML IV BOLUS
INTRAVENOUS | Status: DC | PRN
  Administered 2020-11-28: 50 mg via INTRAVENOUS
  Administered 2020-11-28: 40 mg via INTRAVENOUS
  Administered 2020-11-28: 20 mg via INTRAVENOUS

## 2020-11-28 NOTE — Progress Notes (Signed)
PROGRESS NOTE    Monique Robles  ZSW:[id number] DOB: 1940/06/01 DOA: 11/20/2020 PCP: Celene Squibb, MD   Brief Narrative:  80 year old with history of essential tremor, prior CVA, GERD, overactive bladder came to the hospital for evaluation of hypoxia and progressive shortness of breath.  Patient was diagnosed with likely bronchitis with intermittent fluid overload requiring Lasix.  There is a possibility patient may have drug-related toxicity from nitrofurantoin.  Seen by pulmonary.Hospital stay also complicated by A. fib with RVR started on Cardizem drip. Now she is on Propranolol.  Due to dysphagia she had barium swallow which showed esophageal motility disorder therefore GI plans for endoscopy 11/29/2020.   Assessment & Plan:   Principal Problem:   Acute respiratory failure with hypoxia (HCC) Active Problems:   Essential tremor   Cerebrovascular accident (CVA) ([place])   Overactive bladder   GERD (gastroesophageal reflux disease)   Bronchitis   Obesity (BMI 30-39.9)   Insomnia   Acute respiratory failure (HCC)   Iron deficiency anemia due to chronic blood loss   Esophageal dysphagia   Chronic lung disease   Drug-induced pneumonitis   Acute respiratory failure with hypoxia likely from nitrofurantoin pulmonary toxicity -Improving, will attempt to wean her off 2 L nasal cannula today. -CT of the chest was reviewed, chronic interstitial lung disease identified, negative for pulmonary embolism, possible multifocal pneumonia -Continue Solu-Medrol 40 mg IV every 12, taper over 2 weeks. -Bronchodilators/I-S/flutter. Out of bed to chair.   A. fib with RVR -history of A. fib -This is resolved.  Currently on propranolol.  Lovenox on hold, resume Eliquis once cleared by GI  Dysphagia - Barium esophagram shows esophageal motility disorder.  Plans for endoscopy by GI today.  Continue PPI twice daily   Essential tremor -continue propanolol -Stable  History of CVA -Stable, continue  Eliquis,  -Not on any statins    GERD Continue Protonix Stable   Overactive bladder Continue trospium and vibegron Stable    Insomnia Continue melatonin   Obesity (BMI 34.96) Patient will be counseled on diet and lifestyle modification when more stable   Chronic anemia -iron deficiency anemia -Eventually will need outpatient colonoscopy  PT/OT recommended SNF.  TOC consulted.  DVT prophylaxis: On Eliquis Code Status: Full code Family Communication: Daughter at bedside   Status is: Inpatient  Remains inpatient appropriate because:Inpatient level of care appropriate due to severity of illness. Still having issues with dysphagia. Plans for EGD today  Dispo: The patient is from: Home              Anticipated d/c is to: Home              Patient currently is not medically stable to d/c.   Difficult to place patient No       Subjective: No acute events overnight.  She is quite deconditioned anytime she moves around or gets up.  Overall very weak but very motivated  Review of Systems Otherwise negative except as per HPI, including: General: Denies fever, chills, night sweats or unintended weight loss. Resp: Denies cough, wheezing, shortness of breath. Cardiac: Denies chest pain, palpitations, orthopnea, paroxysmal nocturnal dyspnea. GI: Denies abdominal pain, nausea, vomiting, diarrhea or constipation GU: Denies dysuria, frequency, hesitancy or incontinence MS: Denies muscle aches, joint pain or swelling Neuro: Denies headache, neurologic deficits (focal weakness, numbness, tingling), abnormal gait Psych: Denies anxiety, depression, SI/HI/AVH Skin: Denies new rashes or lesions ID: Denies sick contacts, exotic exposures, travel  Examination: Constitutional: Not in acute distress.  Elderly frail.  Generally weak.  2L nasal cannula Respiratory: Some bibasilar rhonchi Cardiovascular: Normal sinus rhythm, no rubs Abdomen: Nontender nondistended good bowel  sounds Musculoskeletal: No edema noted Skin: No rashes seen Neurologic: CN 2-12 grossly intact.  And nonfocal Psychiatric: Normal judgment and insight. Alert and oriented x 3. Normal mood.   Objective: Vitals:   11/28/20 0700 11/28/20 0745 11/28/20 0800 11/28/20 1015  BP: 112/62  112/86 127/84  Pulse: 86 78 78 85  Resp: 19 (!) 24 (!) 21   Temp:    97.8 F (36.6 C)  TempSrc:    Oral  SpO2: 97% 95% 96% 99%  Weight:      Height:        Intake/Output Summary (Last 24 hours) at 11/28/2020 1150 Last data filed at 11/28/2020 0300 Gross per 24 hour  Intake --  Output 500 ml  Net -500 ml   Filed Weights   11/26/20 0456 11/27/20 0456 11/28/20 0406  Weight: 83.2 kg 84.4 kg 84.8 kg     Data Reviewed:   CBC: Recent Labs  Lab 11/23/20 0615 11/24/20 0525 11/25/20 0328 11/26/20 0420 11/27/20 0438 11/28/20 0437  WBC 15.0* 11.2* 10.9* 10.8* 11.1* 10.7*  NEUTROABS 13.5*  --   --   --   --   --   HGB 8.6* 8.8* 9.5* 9.7* 10.0* 9.7*  HCT 28.8* 29.2* 31.8* 31.7* 32.9* 33.0*  MCV 94.7 91.8 91.6 89.5 90.9 90.7  PLT 363 365 374 429* 485* 094*   Basic Metabolic Panel: Recent Labs  Lab 11/24/20 1529 11/25/20 0328 11/26/20 0420 11/27/20 0438 11/28/20 0437  NA 136 139 136 136 135  K 3.8 4.1 4.0 4.3 4.3  CL 103 105 103 103 101  CO2 26 22 27 27 27   GLUCOSE 178* 143* 136* 128* 127*  BUN 39* 36* 44* 39* 43*  CREATININE 0.81 0.68 0.74 0.74 0.84  CALCIUM 7.8* 8.1* 8.1* 8.1* 8.0*  MG 2.3  --   --  2.5* 2.5*   GFR: Estimated Creatinine Clearance: 54.5 mL/min (by C-G formula based on SCr of 0.84 mg/dL). Liver Function Tests: No results for input(s): AST, ALT, ALKPHOS, BILITOT, PROT, ALBUMIN in the last 168 hours.  No results for input(s): LIPASE, AMYLASE in the last 168 hours. No results for input(s): AMMONIA in the last 168 hours. Coagulation Profile: No results for input(s): INR, PROTIME in the last 168 hours.  Cardiac Enzymes: No results for input(s): CKTOTAL, CKMB,  CKMBINDEX, TROPONINI in the last 168 hours. BNP (last 3 results) No results for input(s): PROBNP in the last 8760 hours. HbA1C: No results for input(s): HGBA1C in the last 72 hours. CBG: Recent Labs  Lab 11/25/20 1116  GLUCAP 152*   Lipid Profile: No results for input(s): CHOL, HDL, LDLCALC, TRIG, CHOLHDL, LDLDIRECT in the last 72 hours. Thyroid Function Tests: No results for input(s): TSH, T4TOTAL, FREET4, T3FREE, THYROIDAB in the last 72 hours. Anemia Panel: No results for input(s): VITAMINB12, FOLATE, FERRITIN, TIBC, IRON, RETICCTPCT in the last 72 hours. Sepsis Labs: Recent Labs  Lab 11/25/20 0739  PROCALCITON <0.10  LATICACIDVEN 1.7    Recent Results (from the past 240 hour(s))  Resp Panel by RT-PCR (Flu A&B, Covid) Nasopharyngeal Swab     Status: None   Collection Time: 11/20/20  9:56 PM   Specimen: Nasopharyngeal Swab; Nasopharyngeal(NP) swabs in vial transport medium  Result Value Ref Range Status   SARS Coronavirus 2 by RT PCR NEGATIVE NEGATIVE Final    Comment: (NOTE) SARS-CoV-2 target  nucleic acids are NOT DETECTED.  The SARS-CoV-2 RNA is generally detectable in upper respiratory specimens during the acute phase of infection. The lowest concentration of SARS-CoV-2 viral copies this assay can detect is 138 copies/mL. A negative result does not preclude SARS-Cov-2 infection and should not be used as the sole basis for treatment or other patient management decisions. A negative result may occur with  improper specimen collection/handling, submission of specimen other than nasopharyngeal swab, presence of viral mutation(s) within the areas targeted by this assay, and inadequate number of viral copies(<138 copies/mL). A negative result must be combined with clinical observations, patient history, and epidemiological information. The expected result is Negative.  Fact Sheet for Patients:  EntrepreneurPulse.com.au  Fact Sheet for Healthcare  Providers:  IncredibleEmployment.be  This test is no t yet approved or cleared by the [place] FDA and  has been authorized for detection and/or diagnosis of SARS-CoV-2 by FDA under an Emergency Use Authorization (EUA). This EUA will remain  in effect (meaning this test can be used) for the duration of the COVID-19 declaration under Section 564(b)(1) of the Act, 21 U.S.C.section 360bbb-3(b)(1), unless the authorization is terminated  or revoked sooner.       Influenza A by PCR NEGATIVE NEGATIVE Final   Influenza B by PCR NEGATIVE NEGATIVE Final    Comment: (NOTE) The Xpert Xpress SARS-CoV-2/FLU/RSV plus assay is intended as an aid in the diagnosis of influenza from Nasopharyngeal swab specimens and should not be used as a sole basis for treatment. Nasal washings and aspirates are unacceptable for Xpert Xpress SARS-CoV-2/FLU/RSV testing.  Fact Sheet for Patients: EntrepreneurPulse.com.au  Fact Sheet for Healthcare Providers: IncredibleEmployment.be  This test is not yet approved or cleared by the [place] FDA and has been authorized for detection and/or diagnosis of SARS-CoV-2 by FDA under an Emergency Use Authorization (EUA). This EUA will remain in effect (meaning this test can be used) for the duration of the COVID-19 declaration under Section 564(b)(1) of the Act, 21 U.S.C. section 360bbb-3(b)(1), unless the authorization is terminated or revoked.  Performed at [facility], [address] [place], [place] [address]   MRSA Next Gen by PCR, Nasal     Status: None   Collection Time: 11/24/20  9:25 AM   Specimen: Nasal Mucosa; Nasal Swab  Result Value Ref Range Status   MRSA by PCR Next Gen NOT DETECTED NOT DETECTED Final    Comment: (NOTE) The GeneXpert MRSA Assay (FDA approved for NASAL specimens only), is one component of a comprehensive MRSA colonization surveillance program. It is not intended to  diagnose MRSA infection nor to guide or monitor treatment for MRSA infections. Test performance is not FDA approved in patients less than 30 years old. Performed at [facility], [address] [place], [place] [address]          Radiology Studies: DG ESOPHAGUS W SINGLE CM (SOL OR THIN BA)  Result Date: 11/26/2020 CLINICAL DATA:  Abnormal dilated esophagus on CT angio chest exam EXAM: ESOPHOGRAM/BARIUM SWALLOW TECHNIQUE: Single contrast examination was performed using thin barium. Patient also swallowed a 12.5 mm diameter barium tablet. FLUOROSCOPY TIME:  Fluoroscopy Time:  1 minute 36 seconds Radiation Exposure Index (if provided by the fluoroscopic device): 38.7 mGy Number of Acquired Spot Images: multiple fluoroscopic screen captures COMPARISON:  CT angio chest 11/22/2020 FINDINGS: Somewhat dilated esophagus throughout its length. Small hiatal hernia. Diffusely impaired esophageal motility with incomplete clearance of barium by primary peristaltic waves and note of scattered secondary and tertiary  waves. No evidence of esophageal mass, stricture, or wall irregularity. 12.5 mm diameter barium tablet passed from oral cavity to stomach without obstruction. Atherosclerotic calcifications at aortic arch. Bones appear demineralized. No laryngeal penetration or aspiration of contrast were grossly noted on nondedicated exam. IMPRESSION: Diffuse esophageal dysmotility. No evidence of esophageal mass or obstruction. Hiatal hernia. Electronically Signed   By: Lavonia Dana M.D.   On: 11/26/2020 14:31        Scheduled Meds:  azelastine  1 spray Each Nare BID   budesonide (PULMICORT) nebulizer solution  0.25 mg Nebulization BID   Chlorhexidine Gluconate Cloth  6 each Topical Daily   diltiazem  30 mg Oral Q8H   guaiFENesin-dextromethorphan  10 mL Oral Q6H   influenza vaccine adjuvanted  0.5 mL Intramuscular Tomorrow-1000   ipratropium  0.5 mg Nebulization BID   lactobacillus  1 g Oral TID WC    levalbuterol  1.25 mg Nebulization BID   melatonin  6 mg Oral QHS   methylPREDNISolone (SOLU-MEDROL) injection  40 mg Intravenous Q12H   pantoprazole  40 mg Oral BID AC   propranolol  40 mg Oral BID   sodium chloride  2 spray Each Nare BID   Vibegron  1 tablet Oral Daily   Continuous Infusions:     LOS: 7 days   Time spent= 35 mins    Ivan Maskell Arsenio Loader, MD [facility]  If 7PM-7AM, please contact night-coverage  11/28/2020, 11:50 AM

## 2020-11-28 NOTE — TOC Progression Note (Signed)
Transition of Care [facility]) - Progression Note    Patient Details  Name: Monique Robles MRN: [id number] Date of Birth: 04-Apr-1940  Transition of Care [facility]) CM/SW Contact  Boneta Lucks, RN Phone Number: 11/28/2020, 10:54 AM  Clinical Narrative:   Reviewed bed offers with Daughter. She is accepting [facility]. TOC to start [facility] AUTH. DC planning for tomorrow.  Expected Discharge Plan: Skilled Nursing Facility Barriers to Discharge: Continued Medical Work up  Expected Discharge Plan and Services Expected Discharge Plan: [place] arrangements for the past 2 months: Single Family Home                   Readmission Risk Interventions Readmission Risk Prevention Plan 11/28/2020  Transportation Screening Complete  Home Care Screening Complete  Medication Review (RN CM) Complete

## 2020-11-28 NOTE — Anesthesia Postprocedure Evaluation (Signed)
Anesthesia Post Note  Patient: Monique Robles  Procedure(s) Performed: ESOPHAGOGASTRODUODENOSCOPY (EGD) WITH PROPOFOL BALLOON DILATION  Patient location during evaluation: PACU Anesthesia Type: General Level of consciousness: awake and alert Pain management: pain level controlled Vital Signs Assessment: post-procedure vital signs reviewed and stable Respiratory status: spontaneous breathing Cardiovascular status: stable Postop Assessment: no apparent nausea or vomiting Anesthetic complications: no   No notable events documented.   Last Vitals:  Vitals:   11/28/20 1311 11/28/20 1455  BP: 120/89 (!) 89/61  Pulse: 95 80  Resp:  (!) 26  Temp: 36.4 C 36.6 C  SpO2: 95% 97%    Last Pain:  Vitals:   11/28/20 1455  TempSrc:   PainSc: Asleep                 Torrance Frech

## 2020-11-28 NOTE — Progress Notes (Signed)
Physical Therapy Treatment Patient Details Name: Monique Robles MRN: [id number] DOB: March 12, 1940 Today's Date: 11/28/2020   History of Present Illness Monique Robles is a 80 y.o. female with medical history significant for essential tremor, prior CVA, GERD, overactive bladder who presents to the emergency department by daughter due to low oxygen saturation at home today.  Patient has been having shortness of breath worse on exertion for several months, she was referred to cardiologist who cleared her after negative cardiac work-up, she was asked to go back to her PCP, so she could be referred to a pulmonologist.  Shortness of breath got worse over the weekend (9/9) whereby she appears to be in respiratory distress on minimal exertion and today, O2 sat was noted to be a 75% on room air with home pulse oximeter.  She complained of some productive cough but denies fever, chills, runny nose or nasal congestion.  Per daughter at bedside, patient just moved to this area about 2 years ago, prior to this, she underwent an intensive work-up at [place] whereby lung biopsy was done but no pathology was noted at that time.  She was on oxygen for some time, but she has been oxygen free dependent for at least 5 to 10 years per daughter.    PT Comments    Patient has to bed rail to pull self to sitting use BUE with bed flat and fair/good return for completing BLE ROM/strengthening exercises while seated at bedside.  Patient demonstrates increased endurance/distance for taking steps in room while on 2 LPM O2 with SpO2 dropping from 95% to 88%, recovered to 96% after sitting in chair, HR increased above 140 bpm when walking - RN aware.  Patient tolerated sitting up in chair after therapy.  Patient will benefit from continued physical therapy in hospital and recommended venue below to increase strength, balance, endurance for safe ADLs and gait.      Recommendations for follow up therapy are one component of a  multi-disciplinary discharge planning process, led by the attending physician.  Recommendations may be updated based on patient status, additional functional criteria and insurance authorization.  Follow Up Recommendations  SNF     Equipment Recommendations  None recommended by PT    Recommendations for Other Services       Precautions / Restrictions Precautions Precautions: Fall Restrictions Weight Bearing Restrictions: No     Mobility  Bed Mobility Overal bed mobility: Needs Assistance Bed Mobility: Supine to Sit     Supine to sit: Supervision     General bed mobility comments: labored movement for sitting up at bedside having to use both hands on bed rail with bed flat    Transfers Overall transfer level: Needs assistance Equipment used: Rolling walker (2 wheeled) Transfers: Sit to/from [facility] Sit to Stand: Min guard Stand pivot transfers: Min guard       General transfer comment: slightly labored movement  Ambulation/Gait Ambulation/Gait assistance: Min assist;Mod assist Gait Distance (Feet): 20 Feet Assistive device: Rolling walker (2 wheeled) Gait Pattern/deviations: Decreased step length - right;Decreased step length - left;Decreased stride length Gait velocity: decreased   General Gait Details: increased endurance/distance for taking steps with slow labored movement, on 2 LPM with SpO2 dropping from 95% to 88%, limited mostly due to fatigue and HR increasing above 140 bpm   Stairs             Wheelchair Mobility    Modified Rankin (Stroke Patients Only)  Balance Overall balance assessment: Needs assistance Sitting-balance support: Feet supported;No upper extremity supported Sitting balance-Leahy Scale: Good Sitting balance - Comments: seated at EOB   Standing balance support: During functional activity;Bilateral upper extremity supported Standing balance-Leahy Scale: Fair Standing balance comment: using RW                             Cognition Arousal/Alertness: Awake/alert Behavior During Therapy: WFL for tasks assessed/performed Overall Cognitive Status: Within Functional Limits for tasks assessed                                        Exercises General Exercises - Lower Extremity Long Arc Quad: Seated;AROM;Strengthening;Both;10 reps Hip Flexion/Marching: Seated;AROM;Strengthening;Both;10 reps Toe Raises: Seated;AROM;Strengthening;Both;10 reps Heel Raises: Seated;AROM;Strengthening;Both;10 reps    General Comments        Pertinent Vitals/Pain Pain Assessment: No/denies pain    Home Living                      Prior Function            PT Goals (current goals can now be found in the care plan section) Acute Rehab PT Goals Patient Stated Goal: return home after rehab PT Goal Formulation: With patient/family Time For Goal Achievement: 12/09/20 Potential to Achieve Goals: Good Progress towards PT goals: Progressing toward goals    Frequency    Min 3X/week      PT Plan Current plan remains appropriate    Co-evaluation              AM-PAC PT "6 Clicks" Mobility   Outcome Measure  Help needed turning from your back to your side while in a flat bed without using bedrails?: None Help needed moving from lying on your back to sitting on the side of a flat bed without using bedrails?: A Little Help needed moving to and from a bed to a chair (including a wheelchair)?: A Little Help needed standing up from a chair using your arms (e.g., wheelchair or bedside chair)?: A Little Help needed to walk in hospital room?: A Lot Help needed climbing 3-5 steps with a railing? : A Lot 6 Click Score: 17    End of Session Equipment Utilized During Treatment: Oxygen Activity Tolerance: Patient tolerated treatment well;Patient limited by fatigue Patient left: in chair;with call bell/phone within reach;with chair alarm set Nurse Communication:  Mobility status PT Visit Diagnosis: Unsteadiness on feet (R26.81);Other abnormalities of gait and mobility (R26.89);Muscle weakness (generalized) (M62.81)     Time: 1025-8527 PT Time Calculation (min) (ACUTE ONLY): 24 min  Charges:  $Therapeutic Exercise: 8-22 mins $Therapeutic Activity: 8-22 mins                     12:31 PM, 11/28/20 Lonell Grandchild, MPT Physical Therapist with [facility] [phone] office [phone] mobile phone

## 2020-11-28 NOTE — Progress Notes (Signed)
Subjective:  Patient has no complaints.  She is not having any difficulty swallowing saliva.  She denies chest pain or shortness of breath.  Current Medications:  Current Facility-Administered Medications:    0.9 %  sodium chloride infusion, , Intravenous, Continuous, Jodi Mourning, Kristen S, PA-C   [MAR Hold] acetaminophen (TYLENOL) tablet 650 mg, 650 mg, Oral, Q6H PRN, Shahmehdi, Seyed A, MD, 650 mg at 11/27/20 2118   [facility] Hold] azelastine (ASTELIN) 0.1 % nasal spray 1 spray, 1 spray, Each Nare, BID, Shahmehdi, Seyed A, MD, 1 spray at 11/27/20 2117   [facility] Hold] budesonide (PULMICORT) nebulizer solution 0.25 mg, 0.25 mg, Nebulization, BID, Shahmehdi, Seyed A, MD, 0.25 mg at 11/28/20 0801   [MAR Hold] Chlorhexidine Gluconate Cloth 2 % PADS 6 each, 6 each, Topical, Daily, Shahmehdi, Seyed A, MD, 6 each at 11/28/20 1029   [MAR Hold] diltiazem (CARDIZEM) tablet 30 mg, 30 mg, Oral, Q8H, Shahmehdi, Seyed A, MD, 30 mg at 11/27/20 2118   [facility] Hold] diphenhydrAMINE (BENADRYL) capsule 25 mg, 25 mg, Oral, QHS PRN, Zierle-Ghosh, Asia B, DO, 25 mg at 11/27/20 2118   [MAR Hold] guaiFENesin-dextromethorphan (ROBITUSSIN DM) 100-10 MG/5ML syrup 10 mL, 10 mL, Oral, Q6H, Shahmehdi, Seyed A, MD, 10 mL at 11/27/20 2117   [facility] Hold] hydrALAZINE (APRESOLINE) injection 10 mg, 10 mg, Intravenous, Q4H PRN, Amin, Ankit Chirag, MD   influenza vaccine adjuvanted (FLUAD) injection 0.5 mL, 0.5 mL, Intramuscular, Tomorrow-1000, Shahmehdi, Seyed A, MD   [MAR Hold] ipratropium (ATROVENT) nebulizer solution 0.5 mg, 0.5 mg, Nebulization, BID, Amin, Ankit Chirag, MD, 0.5 mg at 11/28/20 0800   lactated ringers infusion, , Intravenous, Continuous, Kiel, Coralie Keens, MD, Last Rate: 10 mL/hr at 11/28/20 1317, 1,000 mL at 11/28/20 1317   [MAR Hold] lactobacillus (FLORANEX/LACTINEX) granules 1 g, 1 g, Oral, TID WC, Shahmehdi, Seyed A, MD, 1 g at 11/27/20 1639   [MAR Hold] levalbuterol (XOPENEX) nebulizer solution 1.25 mg, 1.25 mg,  Nebulization, BID, Amin, Ankit Chirag, MD, 1.25 mg at 11/28/20 0800   [MAR Hold] melatonin tablet 6 mg, 6 mg, Oral, QHS, Shahmehdi, Seyed A, MD, 6 mg at 11/27/20 2118   [facility] Hold] methylPREDNISolone sodium succinate (SOLU-MEDROL) 40 mg/mL injection 40 mg, 40 mg, Intravenous, Q12H, Amin, Ankit Chirag, MD, 40 mg at 11/28/20 0201   [MAR Hold] metoprolol tartrate (LOPRESSOR) injection 5 mg, 5 mg, Intravenous, Q5 min PRN, Shahmehdi, Seyed A, MD, 5 mg at 11/24/20 1520   [MAR Hold] metoprolol tartrate (LOPRESSOR) injection 5 mg, 5 mg, Intravenous, Q4H PRN, Amin, Ankit Chirag, MD   [MAR Hold] ondansetron (ZOFRAN) tablet 4 mg, 4 mg, Oral, Q8H PRN, Shahmehdi, Seyed A, MD, 4 mg at 11/26/20 1124   [MAR Hold] pantoprazole (PROTONIX) EC tablet 40 mg, 40 mg, Oral, BID AC, Carver, Charles K, DO, 40 mg at 11/28/20 0831   [MAR Hold] propranolol (INDERAL) tablet 40 mg, 40 mg, Oral, BID, Shahmehdi, Seyed A, MD, 40 mg at 11/28/20 1028   [MAR Hold] senna-docusate (Senokot-S) tablet 1 tablet, 1 tablet, Oral, QHS PRN, Amin, Ankit Chirag, MD   [MAR Hold] sodium chloride (OCEAN) 0.65 % nasal spray 1 spray, 1 spray, Each Nare, PRN, Shahmehdi, Seyed A, MD, 1 spray at 11/23/20 1602   [MAR Hold] sodium chloride (OCEAN) 0.65 % nasal spray 2 spray, 2 spray, Each Nare, BID, Shahmehdi, Seyed A, MD, 2 spray at 11/27/20 2117   [MAR Hold] Vibegron TABS 1 tablet, 1 tablet, Oral, Daily, Shahmehdi, Seyed A, MD, 1 tablet at 11/28/20 1029   Objective: Blood  pressure 120/89, pulse 95, temperature 97.6 F (36.4 C), resp. rate (!) 21, height _0  (1.549 m), weight 84.8 kg, SpO2 95 %. Patient is alert and in no acute distress. Conjunctiva is pink. Sclera is nonicteric Oropharyngeal mucosa is normal. No neck masses or thyromegaly noted. Cardiac exam with irregular rhythm normal S1 and S2.  Faint systolic murmur noted at aortic area. Lungs are clear to auscultation. Abdomen is soft with mild hypogastric tenderness.  No organomegaly or  masses. She has nonpitting pretibial edema.  Labs/studies Results:   CBC Latest Ref Rng & Units 11/28/2020 11/27/2020 11/26/2020  WBC 4.0 - 10.5 K/uL 10.7(H) 11.1(H) 10.8(H)  Hemoglobin 12.0 - 15.0 g/dL 9.7(L) 10.0(L) 9.7(L)  Hematocrit 36.0 - 46.0 % 33.0(L) 32.9(L) 31.7(L)  Platelets 150 - 400 K/uL 475(H) 485(H) 429(H)    CMP Latest Ref Rng & Units 11/28/2020 11/27/2020 11/26/2020  Glucose 70 - 99 mg/dL 127(H) 128(H) 136(H)  BUN 8 - 23 mg/dL 43(H) 39(H) 44(H)  Creatinine 0.44 - 1.00 mg/dL 0.84 0.74 0.74  Sodium 135 - 145 mmol/L 135 136 136  Potassium 3.5 - 5.1 mmol/L 4.3 4.3 4.0  Chloride 98 - 111 mmol/L 101 103 103  CO2 22 - 32 mmol/L _1 Calcium 8.9 - 10.3 mg/dL 8.0(L) 8.1(L) 8.1(L)  Total Protein 6.5 - 8.1 g/dL - - -  Total Bilirubin 0.3 - 1.2 mg/dL - - -  Alkaline Phos 38 - 126 U/L - - -  AST 15 - 41 U/L - - -  ALT 0 - 44 U/L - - -    Hepatic Function Latest Ref Rng & Units 11/21/2020 08/31/2020  Total Protein 6.5 - 8.1 g/dL 6.8 7.3  Albumin 3.5 - 5.0 g/dL 3.3(L) 4.1  AST 15 - 41 U/L 18 20  ALT 0 - 44 U/L 9 9  Alk Phosphatase 38 - 126 U/L 85 86  Total Bilirubin 0.3 - 1.2 mg/dL 0.5 0.7      Assessment:  #1.  Esophageal dysphagia.  Chest CT revealed obstruction but barium study revealed markedly dilated esophagus.  She is undergoing diagnostic EGD with biopsy under dilation if indicated.  #2.  Chronic atrial fibrillation with controlled ventricular rate.  Anticoagulant on hold for EGD.  #3.  Acute respiratory failure with hypoxia.  She has responded therapy.  #4.  Iron deficiency anemia.  We will be looking for any source of GI blood loss on EGD.   Plan:  Proceed with esophagogastroduodenoscopy with possible dilation.  Procedure explained with the patient and she is agreeable.

## 2020-11-28 NOTE — Op Note (Signed)
[facility] Patient Name: Monique Robles Procedure Date: 11/28/2020 2:03 PM MRN: [id number] Date of Birth: [birth year] Attending MD: Hildred Laser , MD CSN: [id number] Age: 80 Admit Type: Outpatient Procedure:                Upper GI endoscopy Indications:              Esophageal dysphagia, Abnormal cine-esophagram,                            Abnormal CT of the GI tract Providers:                Hildred Laser, MD, Rosina Lowenstein, RN, Nelma Rothman,                            Technician Referring MD:              Medicines:                Propofol per Anesthesia Complications:            No immediate complications. Estimated Blood Loss:     Estimated blood loss was minimal. Procedure:                Pre-Anesthesia Assessment:                           - Prior to the procedure, a History and Physical                            was performed, and patient medications and                            allergies were reviewed. The patient's tolerance of                            previous anesthesia was also reviewed. The risks                            and benefits of the procedure and the sedation                            options and risks were discussed with the patient.                            All questions were answered, and informed consent                            was obtained. Prior Anticoagulants: The patient                            last took Eliquis (apixaban) 3 days prior to the                            procedure. ASA Grade Assessment: III - A patient  with severe systemic disease. After reviewing the                            risks and benefits, the patient was deemed in                            satisfactory condition to undergo the procedure.                           After obtaining informed consent, the endoscope was                            passed under direct vision. Throughout the                            procedure, the patient's  blood pressure, pulse, and                            oxygen saturations were monitored continuously. The                            GIF-H190 ([id number]) scope was introduced through the                            mouth, and advanced to the second part of duodenum.                            The upper GI endoscopy was accomplished without                            difficulty. The patient tolerated the procedure                            well. Scope In: 2:35:59 PM Scope Out: 2:47:05 PM Total Procedure Duration: 0 hours 11 minutes 6 seconds  Findings:      The hypopharynx was normal.      The lumen of the mid esophagus and distal esophagus was mildly dilated.      There were esophageal mucosal changes suspicious for short-segment       Barrett's esophagus present in the distal esophagus. The maximum       longitudinal extent of these mucosal changes was 2 cm in length. This       was biopsied with a cold forceps for histology. The pathology specimen       was placed into Bottle Number 1.      One benign-appearing, intrinsic mild stenosis was found 31 cm from the       incisors. The stenosis was traversed. A TTS dilator was passed through       the scope. Dilation with a 15-16.5-18 mm balloon dilator was performed       to 18 mm. The dilation site was examined and showed no change and no       bleeding, mucosal tear or perforation.      A 6 cm hiatal hernia was present.      The entire examined stomach was normal.  The duodenal bulb and second portion of the duodenum were normal. Impression:               - Normal hypopharynx.                           - Dilation in the mid esophagus and in the distal                            esophagus.                           - Esophageal mucosal changes suspicious for                            short-segment Barrett's esophagus. Biopsied post                            dilation.                           - Benign-appearing esophageal stenosis.  Dilated.                           - 6 cm hiatal hernia.                           - Normal stomach.                           - Normal duodenal bulb and second portion of the                            duodenum. Moderate Sedation:      Per Anesthesia Care Recommendation:           - Return patient to ICU for ongoing care.                           - Mechanical soft diet today.                           - Continue present medications.                           - Resume Eliquis (apixaban) at prior dose tomorrow.                           - Await pathology results. Procedure Code(s):        --- Professional ---                           [phone], Esophagogastroduodenoscopy, flexible,                            transoral; with transendoscopic balloon dilation of                            esophagus (less than 30 mm  diameter) Diagnosis Code(s):        --- Professional ---                           K22.8, Other specified diseases of esophagus                           K22.2, Esophageal obstruction                           K44.9, Diaphragmatic hernia without obstruction or                            gangrene                           R13.14, Dysphagia, pharyngoesophageal phase                           R93.3, Abnormal findings on diagnostic imaging of                            other parts of digestive tract CPT copyright 2019 [employer]. All rights reserved. The codes documented in this report are preliminary and upon coder review may  be revised to meet current compliance requirements. Hildred Laser, MD Hildred Laser, MD 11/28/2020 3:02:34 PM This report has been signed electronically. Number of Addenda: 0

## 2020-11-28 NOTE — Anesthesia Preprocedure Evaluation (Signed)
Anesthesia Evaluation  Patient identified by MRN, date of birth, ID band Patient awake    Reviewed: Allergy & Precautions, H&P , NPO status , Patient's Chart, lab work & pertinent test results, reviewed documented beta blocker date and time   Airway Mallampati: II  TM Distance: >3 FB Neck ROM: full    Dental no notable dental hx.    Pulmonary neg pulmonary ROS,    Pulmonary exam normal breath sounds clear to auscultation       Cardiovascular Exercise Tolerance: Good hypertension, negative cardio ROS   Rhythm:regular Rate:Normal     Neuro/Psych negative neurological ROS  negative psych ROS   GI/Hepatic Neg liver ROS, GERD  Medicated,  Endo/Other  negative endocrine ROS  Renal/GU negative Renal ROS  negative genitourinary   Musculoskeletal   Abdominal   Peds  Hematology  (+) Blood dyscrasia, anemia ,   Anesthesia Other Findings 1. Left ventricular ejection fraction, by estimation, is 65 to 70%. The  left ventricle has normal function. The left ventricle has no regional  wall motion abnormalities. Left ventricular diastolic parameters are  consistent with Grade I diastolic  dysfunction (impaired relaxation).  2. Right ventricular systolic function is normal. The right ventricular  size is normal. There is normal pulmonary artery systolic pressure.  3. Left atrial size was mildly dilated.  4. The mitral valve is normal in structure. Mild mitral valve  regurgitation.  5. AV is thickened, calcified with very mildly restricted motion. Peak  and mean gradients through the valve are 20 and 10 mm Hg respectively  consistent with mild AS.Marland Kitchen The aortic valve is tricuspid. Aortic valve  regurgitation is not visualized.  6. The inferior vena cava is normal in size with greater than 50%  respiratory variability, suggesting right atrial pressure of 3 mmHg.   Reproductive/Obstetrics negative OB ROS                              Anesthesia Physical Anesthesia Plan  ASA: 3  Anesthesia Plan: General   Post-op Pain Management:    Induction:   PONV Risk Score and Plan: Propofol infusion  Airway Management Planned:   Additional Equipment:   Intra-op Plan:   Post-operative Plan:   Informed Consent: I have reviewed the patients History and Physical, chart, labs and discussed the procedure including the risks, benefits and alternatives for the proposed anesthesia with the patient or authorized representative who has indicated his/her understanding and acceptance.     Dental Advisory Given  Plan Discussed with: CRNA  Anesthesia Plan Comments:         Anesthesia Quick Evaluation

## 2020-11-28 NOTE — Progress Notes (Signed)
Brief EGD note.  Normal hypopharynx. Mildly dilated esophagus. Noncircumferential salmon-colored mucosa suspicious for Barrett's esophagus.  1 patch was 20 mm long.  Biopsy taken after GE junction was dilated. Noncritical narrowing at GE junction.  He was dilated with balloon to 18 mm without causing mucosal disruption. Moderate to large sliding hiatal hernia measuring 6 cm in length. Normal examined the stomach first and second part of duodenum. Patient tolerated the procedure well.

## 2020-11-28 NOTE — Progress Notes (Signed)
Pt off unit for procedure this shift and returned with no distress noted. Vital signs wnl. Family at bedside. Pt requesting something to drink, MD notified with new diet orders rendered. Pt able to swallow with no difficulty. Denies pain or distress. Call bell in reach.

## 2020-11-28 NOTE — Transfer of Care (Signed)
Immediate Anesthesia Transfer of Care Note  Patient: Monique Robles  Procedure(s) Performed: ESOPHAGOGASTRODUODENOSCOPY (EGD) WITH PROPOFOL BALLOON DILATION  Patient Location: PACU  Anesthesia Type:General  Level of Consciousness: awake  Airway & Oxygen Therapy: Patient Spontanous Breathing  Post-op Assessment: Report given to RN  Post vital signs: Reviewed and stable  Last Vitals:  Vitals Value Taken Time  BP 89/61 11/28/20 1454  Temp    Pulse 88 11/28/20 1456  Resp 26 11/28/20 1456  SpO2 97 % 11/28/20 1456  Vitals shown include unvalidated device data.  Last Pain:  Vitals:   11/28/20 1431  TempSrc:   PainSc: 0-No pain      Patients Stated Pain Goal: 8 (00/34/91 7915)  Complications: No notable events documented.

## 2020-11-29 ENCOUNTER — Encounter: Payer: Self-pay | Admitting: Internal Medicine

## 2020-11-29 ENCOUNTER — Telehealth: Payer: Self-pay | Admitting: Gastroenterology

## 2020-11-29 DIAGNOSIS — J984 Other disorders of lung: Secondary | ICD-10-CM | POA: Diagnosis not present

## 2020-11-29 DIAGNOSIS — I4819 Other persistent atrial fibrillation: Secondary | ICD-10-CM | POA: Diagnosis not present

## 2020-11-29 DIAGNOSIS — Z820 Family history of epilepsy and other diseases of the nervous system: Secondary | ICD-10-CM | POA: Diagnosis not present

## 2020-11-29 DIAGNOSIS — R0609 Other forms of dyspnea: Secondary | ICD-10-CM | POA: Diagnosis not present

## 2020-11-29 DIAGNOSIS — Z7901 Long term (current) use of anticoagulants: Secondary | ICD-10-CM | POA: Diagnosis not present

## 2020-11-29 DIAGNOSIS — E876 Hypokalemia: Secondary | ICD-10-CM | POA: Diagnosis not present

## 2020-11-29 DIAGNOSIS — I080 Rheumatic disorders of both mitral and aortic valves: Secondary | ICD-10-CM | POA: Diagnosis not present

## 2020-11-29 DIAGNOSIS — T50901D Poisoning by unspecified drugs, medicaments and biological substances, accidental (unintentional), subsequent encounter: Secondary | ICD-10-CM | POA: Diagnosis not present

## 2020-11-29 DIAGNOSIS — K219 Gastro-esophageal reflux disease without esophagitis: Secondary | ICD-10-CM | POA: Diagnosis not present

## 2020-11-29 DIAGNOSIS — J40 Bronchitis, not specified as acute or chronic: Secondary | ICD-10-CM | POA: Diagnosis not present

## 2020-11-29 DIAGNOSIS — G250 Essential tremor: Secondary | ICD-10-CM | POA: Diagnosis not present

## 2020-11-29 DIAGNOSIS — T50905D Adverse effect of unspecified drugs, medicaments and biological substances, subsequent encounter: Secondary | ICD-10-CM | POA: Diagnosis not present

## 2020-11-29 DIAGNOSIS — R0602 Shortness of breath: Secondary | ICD-10-CM | POA: Diagnosis not present

## 2020-11-29 DIAGNOSIS — R2243 Localized swelling, mass and lump, lower limb, bilateral: Secondary | ICD-10-CM | POA: Diagnosis not present

## 2020-11-29 DIAGNOSIS — R079 Chest pain, unspecified: Secondary | ICD-10-CM | POA: Diagnosis not present

## 2020-11-29 DIAGNOSIS — I4891 Unspecified atrial fibrillation: Secondary | ICD-10-CM | POA: Diagnosis not present

## 2020-11-29 DIAGNOSIS — Z20822 Contact with and (suspected) exposure to covid-19: Secondary | ICD-10-CM | POA: Diagnosis not present

## 2020-11-29 DIAGNOSIS — R6889 Other general symptoms and signs: Secondary | ICD-10-CM | POA: Diagnosis not present

## 2020-11-29 DIAGNOSIS — R6520 Severe sepsis without septic shock: Secondary | ICD-10-CM | POA: Diagnosis not present

## 2020-11-29 DIAGNOSIS — Z743 Need for continuous supervision: Secondary | ICD-10-CM | POA: Diagnosis not present

## 2020-11-29 DIAGNOSIS — T50905A Adverse effect of unspecified drugs, medicaments and biological substances, initial encounter: Secondary | ICD-10-CM | POA: Diagnosis not present

## 2020-11-29 DIAGNOSIS — Z96652 Presence of left artificial knee joint: Secondary | ICD-10-CM | POA: Diagnosis not present

## 2020-11-29 DIAGNOSIS — A419 Sepsis, unspecified organism: Secondary | ICD-10-CM | POA: Diagnosis not present

## 2020-11-29 DIAGNOSIS — D500 Iron deficiency anemia secondary to blood loss (chronic): Secondary | ICD-10-CM | POA: Diagnosis not present

## 2020-11-29 DIAGNOSIS — J8410 Pulmonary fibrosis, unspecified: Secondary | ICD-10-CM | POA: Diagnosis not present

## 2020-11-29 DIAGNOSIS — D649 Anemia, unspecified: Secondary | ICD-10-CM | POA: Diagnosis not present

## 2020-11-29 DIAGNOSIS — J9611 Chronic respiratory failure with hypoxia: Secondary | ICD-10-CM | POA: Diagnosis not present

## 2020-11-29 DIAGNOSIS — I5030 Unspecified diastolic (congestive) heart failure: Secondary | ICD-10-CM | POA: Diagnosis not present

## 2020-11-29 DIAGNOSIS — R000 Tachycardia, unspecified: Secondary | ICD-10-CM | POA: Diagnosis not present

## 2020-11-29 DIAGNOSIS — I509 Heart failure, unspecified: Secondary | ICD-10-CM | POA: Diagnosis not present

## 2020-11-29 DIAGNOSIS — E785 Hyperlipidemia, unspecified: Secondary | ICD-10-CM | POA: Diagnosis not present

## 2020-11-29 DIAGNOSIS — I2510 Atherosclerotic heart disease of native coronary artery without angina pectoris: Secondary | ICD-10-CM | POA: Diagnosis not present

## 2020-11-29 DIAGNOSIS — Z66 Do not resuscitate: Secondary | ICD-10-CM | POA: Diagnosis not present

## 2020-11-29 DIAGNOSIS — J9811 Atelectasis: Secondary | ICD-10-CM | POA: Diagnosis not present

## 2020-11-29 DIAGNOSIS — J9601 Acute respiratory failure with hypoxia: Secondary | ICD-10-CM | POA: Diagnosis not present

## 2020-11-29 DIAGNOSIS — N179 Acute kidney failure, unspecified: Secondary | ICD-10-CM | POA: Diagnosis not present

## 2020-11-29 DIAGNOSIS — I959 Hypotension, unspecified: Secondary | ICD-10-CM | POA: Diagnosis not present

## 2020-11-29 DIAGNOSIS — T378X5S Adverse effect of other specified systemic anti-infectives and antiparasitics, sequela: Secondary | ICD-10-CM | POA: Diagnosis not present

## 2020-11-29 DIAGNOSIS — R279 Unspecified lack of coordination: Secondary | ICD-10-CM | POA: Diagnosis not present

## 2020-11-29 DIAGNOSIS — J90 Pleural effusion, not elsewhere classified: Secondary | ICD-10-CM | POA: Diagnosis not present

## 2020-11-29 DIAGNOSIS — M810 Age-related osteoporosis without current pathological fracture: Secondary | ICD-10-CM | POA: Diagnosis not present

## 2020-11-29 DIAGNOSIS — I480 Paroxysmal atrial fibrillation: Secondary | ICD-10-CM | POA: Diagnosis not present

## 2020-11-29 DIAGNOSIS — R1310 Dysphagia, unspecified: Secondary | ICD-10-CM | POA: Diagnosis not present

## 2020-11-29 DIAGNOSIS — Z809 Family history of malignant neoplasm, unspecified: Secondary | ICD-10-CM | POA: Diagnosis not present

## 2020-11-29 DIAGNOSIS — N3281 Overactive bladder: Secondary | ICD-10-CM | POA: Diagnosis not present

## 2020-11-29 DIAGNOSIS — I10 Essential (primary) hypertension: Secondary | ICD-10-CM | POA: Diagnosis not present

## 2020-11-29 DIAGNOSIS — I110 Hypertensive heart disease with heart failure: Secondary | ICD-10-CM | POA: Diagnosis not present

## 2020-11-29 DIAGNOSIS — Z8673 Personal history of transient ischemic attack (TIA), and cerebral infarction without residual deficits: Secondary | ICD-10-CM | POA: Diagnosis not present

## 2020-11-29 DIAGNOSIS — I5033 Acute on chronic diastolic (congestive) heart failure: Secondary | ICD-10-CM | POA: Diagnosis not present

## 2020-11-29 DIAGNOSIS — M6281 Muscle weakness (generalized): Secondary | ICD-10-CM | POA: Diagnosis not present

## 2020-11-29 DIAGNOSIS — R2689 Other abnormalities of gait and mobility: Secondary | ICD-10-CM | POA: Diagnosis not present

## 2020-11-29 DIAGNOSIS — R0689 Other abnormalities of breathing: Secondary | ICD-10-CM | POA: Diagnosis not present

## 2020-11-29 DIAGNOSIS — I951 Orthostatic hypotension: Secondary | ICD-10-CM | POA: Diagnosis not present

## 2020-11-29 DIAGNOSIS — G4700 Insomnia, unspecified: Secondary | ICD-10-CM | POA: Diagnosis not present

## 2020-11-29 DIAGNOSIS — J189 Pneumonia, unspecified organism: Secondary | ICD-10-CM | POA: Diagnosis not present

## 2020-11-29 DIAGNOSIS — R0600 Dyspnea, unspecified: Secondary | ICD-10-CM | POA: Diagnosis not present

## 2020-11-29 DIAGNOSIS — Z79899 Other long term (current) drug therapy: Secondary | ICD-10-CM | POA: Diagnosis not present

## 2020-11-29 DIAGNOSIS — I499 Cardiac arrhythmia, unspecified: Secondary | ICD-10-CM | POA: Diagnosis not present

## 2020-11-29 DIAGNOSIS — R42 Dizziness and giddiness: Secondary | ICD-10-CM | POA: Diagnosis not present

## 2020-11-29 DIAGNOSIS — R0902 Hypoxemia: Secondary | ICD-10-CM | POA: Diagnosis not present

## 2020-11-29 DIAGNOSIS — R1314 Dysphagia, pharyngoesophageal phase: Secondary | ICD-10-CM | POA: Diagnosis not present

## 2020-11-29 DIAGNOSIS — R5381 Other malaise: Secondary | ICD-10-CM | POA: Diagnosis not present

## 2020-11-29 DIAGNOSIS — J9621 Acute and chronic respiratory failure with hypoxia: Secondary | ICD-10-CM | POA: Diagnosis not present

## 2020-11-29 DIAGNOSIS — Z7401 Bed confinement status: Secondary | ICD-10-CM | POA: Diagnosis not present

## 2020-11-29 LAB — BASIC METABOLIC PANEL
Anion gap: 6 (ref 5–15)
BUN: 36 mg/dL — ABNORMAL HIGH (ref 8–23)
CO2: 28 mmol/L (ref 22–32)
Calcium: 7.9 mg/dL — ABNORMAL LOW (ref 8.9–10.3)
Chloride: 101 mmol/L (ref 98–111)
Creatinine, Ser: 0.69 mg/dL (ref 0.44–1.00)
GFR, Estimated: >60 mL/min (ref >60)
Glucose, Bld: 121 mg/dL — ABNORMAL HIGH (ref 70–99)
Potassium: 4.8 mmol/L (ref 3.5–5.1)
Sodium: 135 mmol/L (ref 135–145)

## 2020-11-29 LAB — CBC
HCT: 31.7 % — ABNORMAL LOW (ref 36.0–46.0)
Hemoglobin: 9.7 g/dL — ABNORMAL LOW (ref 12.0–15.0)
MCH: 28 pg (ref 26.0–34.0)
MCHC: 30.6 g/dL (ref 30.0–36.0)
MCV: 91.4 fL (ref 80.0–100.0)
Platelets: 499 10*3/uL — ABNORMAL HIGH (ref 150–400)
RBC: 3.47 MIL/uL — ABNORMAL LOW (ref 3.87–5.11)
RDW: 14.6 % (ref 11.5–15.5)
WBC: 11.3 10*3/uL — ABNORMAL HIGH (ref 4.0–10.5)
nRBC: 0.2 % (ref 0.0–0.2)

## 2020-11-29 LAB — RESP PANEL BY RT-PCR (FLU A&B, COVID) ARPGX2
Influenza A by PCR: NEGATIVE
Influenza B by PCR: NEGATIVE
SARS Coronavirus 2 by RT PCR: NEGATIVE

## 2020-11-29 LAB — MAGNESIUM: Magnesium: 2.5 mg/dL — ABNORMAL HIGH (ref 1.7–2.4)

## 2020-11-29 MED ORDER — PREDNISONE 10 MG PO TABS: ORAL_TABLET | ORAL | 0 refills | Status: AC

## 2020-11-29 MED ORDER — HYDROCODONE-ACETAMINOPHEN 5-325 MG PO TABS: 1.0000 | ORAL_TABLET | Freq: Two times a day (BID) | ORAL | 0 refills | Status: DC | PRN

## 2020-11-29 MED ORDER — LEVALBUTEROL HCL 1.25 MG/0.5ML IN NEBU: 1.2500 mg | INHALATION_SOLUTION | Freq: Four times a day (QID) | RESPIRATORY_TRACT | 12 refills | Status: DC | PRN

## 2020-11-29 MED ORDER — FLORANEX PO PACK: 1.0000 g | PACK | Freq: Three times a day (TID) | ORAL | Status: DC

## 2020-11-29 MED ORDER — IPRATROPIUM BROMIDE 0.02 % IN SOLN
0.5000 mg | Freq: Four times a day (QID) | RESPIRATORY_TRACT | 12 refills | Status: DC | PRN
Start: 2020-11-29 — End: 2020-12-06

## 2020-11-29 MED ORDER — DILTIAZEM HCL 30 MG PO TABS: 30.0000 mg | ORAL_TABLET | Freq: Three times a day (TID) | ORAL | Status: DC

## 2020-11-29 MED ORDER — APIXABAN 5 MG PO TABS
5.0000 mg | ORAL_TABLET | Freq: Two times a day (BID) | ORAL | Status: DC
  Administered 2020-11-29: 5 mg via ORAL
  Filled 2020-11-29: qty 1

## 2020-11-29 MED ORDER — SENNOSIDES-DOCUSATE SODIUM 8.6-50 MG PO TABS: 2.0000 | ORAL_TABLET | Freq: Every evening | ORAL | Status: DC | PRN

## 2020-11-29 NOTE — TOC Transition Note (Signed)
Transition of Care [facility]) - CM/SW Discharge Note   Patient Details  Name: Monique Robles MRN: [id number] Date of Birth: 09-Sep-1940  [facility] CM/SW Contact:  Boneta Lucks, RN Phone Number: 11/29/2020, 1:40 PM   Clinical Narrative:   Patient is medically ready to [facility] Mardene Celeste provided number for report. Clinicals sent in the hub. COVID test negative. RN to call report and TOC will call EMS when RN is ready.  TOC call Daughter to update her with discharge plan.   Final next level of care: Skilled Nursing Facility Barriers to Discharge: Barriers Resolved  Patient Goals and CMS Choice Patient states their goals for this hospitalization and ongoing recovery are:: Rehab with SNF CMS Medicare.gov Compare Post Acute Care list provided to:: Patient Choice offered to / list presented to : Patient  Discharge Placement             Patient chooses bed at:  [facility]) Patient to be transferred to facility by: EMS Name of family member notified: [name] Patient and family notified of of transfer: 11/29/20  Discharge Plan and Services               Readmission Risk Interventions Readmission Risk Prevention Plan 11/29/2020 11/28/2020  Transportation Screening - Complete  PCP or Specialist Appt within 5-7 Days Complete -  Home Care Screening - Complete  Medication Review (RN CM) - Complete

## 2020-11-29 NOTE — Care Management Important Message (Signed)
Important Message  Patient Details  Name: Monique Robles MRN: [id number] Date of Birth: 22-Aug-1940   Medicare Important Message Given:  Yes     Tommy Medal 11/29/2020, 11:51 AM

## 2020-11-29 NOTE — Telephone Encounter (Signed)
Patient needs hospital follow up with Dr. Abbey Chatters or Cyril Mourning in 4-6 weeks.

## 2020-11-29 NOTE — Final Progress Note (Signed)
Report called to [place], Therapist, sports at [facility] . EMS has been called , patients home meds are with her in her room in pink belongings bag

## 2020-11-29 NOTE — Discharge Summary (Signed)
[place] Discharge Summary  Monique Robles ZHY:[id number] DOB: 1940-06-27 DOA: 11/20/2020  PCP: Celene Squibb, MD  Admit date: 11/20/2020 Discharge date: 11/29/2020  Admitted From: Home Disposition:  SNF  Recommendations for Outpatient Follow-up:  Follow up with PCP in 1-2 weeks Follow up with Pulm, Dr Melvyn Novas or Dr Halford Chessman in 10-14 days Please obtain BMP/CBC and Mg in 3-5 days Bowel regimen prn to have atleast 1 soft BM daily  Supplemental O2 to keep O2 >90%. Transiently may drop with Physical therapy, ok to increase it.  Avoid further use of Nitrofurantoin Eliquis 5mg  po bid Encourage Use of Incentive spirometry and flutter valve Prednisone taper ordered over 2 weeks    Discharge Condition: Stable CODE STATUS: Full Code  Diet recommendation: Heart healthy  Brief/Interim Summary: 80 year old with history of essential tremor, prior CVA, GERD, overactive bladder came to the hospital for evaluation of hypoxia and progressive shortness of breath.  Patient was diagnosed with likely bronchitis with intermittent fluid overload requiring Lasix.  There is a possibility patient may have drug-related toxicity from nitrofurantoin.  Seen by pulmonary.Hospital stay also complicated by A. fib with RVR started on Cardizem drip. Now she is on Propranolol.  Due to dysphagia she had barium swallow which showed esophageal motility disorder therefore GI plans for endoscopy 11/29/2020.  EGD showed benign stenosis requiring dilation.  Eliquis was resumed on 11/29/2020.  PT recommended SNF therefore arrangements were made.  Rest the recommendations as stated above.     Assessment & Plan:   Principal Problem:   Acute respiratory failure with hypoxia (HCC) Active Problems:   Essential tremor   Cerebrovascular accident (CVA) ([place])   Overactive bladder   GERD (gastroesophageal reflux disease)   Bronchitis   Obesity (BMI 30-39.9)   Insomnia   Acute respiratory failure (HCC)   Iron deficiency anemia due to  chronic blood loss   Esophageal dysphagia   Chronic lung disease   Drug-induced pneumonitis     Acute respiratory failure with hypoxia likely from nitrofurantoin pulmonary toxicity - Improving, she is on 2 L nasal cannula.  Due to poor lung compliance, expect transiently become hypoxic while working with physical therapy but should recover.  Okay to increase supplemental oxygen if necessary in the meantime. -CT of the chest was reviewed, chronic interstitial lung disease identified, negative for pulmonary embolism, possible multifocal pneumonia - Pulmonary recommends prednisone taper over 2 weeks and follow-up with their service in 10-14 days.  Continue bronchodilators as necessary to be used post discharge.  A. fib with RVR -history of A. fib - Continue propranolol and Eliquis.  Cardizem PO   Dysphagia - Barium swallow showed esophageal motility disorder.  Endoscopy performed 9/21 showed benign esophageal stenosis and possible short segment of Barrett's esophagus.  Biopsies were performed along with dilution.   Essential tremor -continue propanolol -Stable  History of CVA -Stable, continue Eliquis,  -Not on any statins    GERD Continue Protonix Stable   Overactive bladder Continue trospium and vibegron Stable    Insomnia Continue melatonin.  Would avoid Benadryl or any other medications on beers list   Obesity (BMI 34.96) Patient will be counseled on diet and lifestyle modification when more stable   Chronic anemia -iron deficiency anemia - Will require outpatient colonoscopy once she is recovered from her acute illness    Body mass index is 35.32 kg/m.         Discharge Diagnoses:  Principal Problem:   Acute respiratory failure with hypoxia (HCC) Active Problems:  Essential tremor   Cerebrovascular accident (CVA) ([place])   Overactive bladder   GERD (gastroesophageal reflux disease)   Bronchitis   Obesity (BMI 30-39.9)   Insomnia   Acute respiratory  failure (HCC)   Iron deficiency anemia due to chronic blood loss   Esophageal dysphagia   Chronic lung disease   Drug-induced pneumonitis      Consultations: GI  Pulm  Subjective: Feels ok, very deconditioned.  Daughter is present at bedside and I explained to her that patient really needs aggressive physical therapy.  Discharge Exam: Vitals:   11/29/20 0843 11/29/20 0848  BP:    Pulse:    Resp:    Temp:    SpO2: 97% 100%   Vitals:   11/29/20 0030 11/29/20 0401 11/29/20 0843 11/29/20 0848  BP: 106/66 109/67    Pulse: 72 98    Resp: 19 19    Temp: 98.1 F (36.7 C) 97.6 F (36.4 C)    TempSrc: Oral Oral    SpO2: 98% 98% 97% 100%  Weight:      Height:        General: Pt is alert, awake, not in acute distress Cardiovascular: RRR, S1/S2 +, no rubs, no gallops Respiratory: Minimal bibasilar rhonchi Abdominal: Soft, NT, ND, bowel sounds + Extremities: no edema, no cyanosis  Discharge Instructions  Discharge Instructions     Ambulatory referral to Pulmonology   Complete by: As directed    Follow up Dr Melvyn Novas or Dr Halford Chessman in 10-14 days. Seen by them in the hospital   Reason for referral: Interstitial Lung Disease(ILD)      Allergies as of 11/29/2020       Reactions   Coumadin [warfarin] Hives   Atorvastatin Other (See Comments)   Muscle aches   Ciprofloxacin    Sulfa Antibiotics Itching, Rash        Medication List     STOP taking these medications    nitrofurantoin (macrocrystal-monohydrate) 100 MG capsule Commonly known as: MACROBID   nitrofurantoin 50 MG capsule Commonly known as: MACRODANTIN   Potassium Chloride ER 20 MEQ Tbcr       TAKE these medications    albuterol (2.5 MG/3ML) 0.083% nebulizer solution Commonly known as: PROVENTIL Take 2.5 mg by nebulization every 4 (four) hours as needed for wheezing or shortness of breath.   albuterol 108 (90 Base) MCG/ACT inhaler Commonly known as: VENTOLIN HFA Inhale 1-2 puffs into the lungs  every 4 (four) hours as needed for shortness of breath or wheezing.   apixaban 5 MG Tabs tablet Commonly known as: ELIQUIS Take 5 mg by mouth 2 (two) times daily.   BENADRYL PO Take 1 tablet by mouth as needed (seasonal allergies).   calcium carbonate 500 MG chewable tablet Commonly known as: TUMS - dosed in mg elemental calcium Chew 1 tablet by mouth daily as needed for indigestion or heartburn.   cyclobenzaprine 5 MG tablet Commonly known as: FLEXERIL Take 5 mg by mouth daily as needed.   diltiazem 30 MG tablet Commonly known as: CARDIZEM Take 1 tablet (30 mg total) by mouth every 8 (eight) hours.   furosemide 20 MG tablet Commonly known as: LASIX Take 20 mg by mouth daily.   gabapentin 600 MG tablet Commonly known as: NEURONTIN Take 600 mg by mouth 3 (three) times daily.   Gemtesa 75 MG Tabs Generic drug: Vibegron Take 1 capsule by mouth daily.   HYDROcodone-acetaminophen 5-325 MG tablet Commonly known as: NORCO/VICODIN Take 1 tablet by mouth 2 (  two) times daily as needed for moderate pain.   ipratropium 0.02 % nebulizer solution Commonly known as: ATROVENT Take 2.5 mLs (0.5 mg total) by nebulization every 6 (six) hours as needed for wheezing or shortness of breath.   lactobacillus Pack Take 1 packet (1 g total) by mouth 3 (three) times daily with meals.   levalbuterol 1.25 MG/0.5ML nebulizer solution Commonly known as: XOPENEX Take 1.25 mg by nebulization every 6 (six) hours as needed for wheezing or shortness of breath.   Melatonin 10 MG Tabs Take 10 mg by mouth at bedtime.   multivitamin tablet Take 1 tablet by mouth daily.   omeprazole 40 MG capsule Commonly known as: PRILOSEC Take 1 capsule by mouth daily.   predniSONE 10 MG tablet Commonly known as: DELTASONE Take 4 tablets (40 mg total) by mouth daily for 2 days, THEN 4 tablets (40 mg total) daily with breakfast for 3 days, THEN 3 tablets (30 mg total) daily with breakfast for 3 days, THEN 2  tablets (20 mg total) daily with breakfast for 3 days, THEN 1 tablet (10 mg total) daily with breakfast for 3 days. Start taking on: November 29, 2020   propranolol 40 MG tablet Commonly known as: INDERAL Take 1 tablet (40 mg total) by mouth 2 (two) times daily.   senna-docusate 8.6-50 MG tablet Commonly known as: Senokot-S Take 2 tablets by mouth at bedtime as needed for moderate constipation.   Trospium Chloride 60 MG Cp24 Take 1 capsule (60 mg total) by mouth daily.   TYLENOL ARTHRITIS PAIN PO Take 650 mg by mouth daily as needed (pain).   VITAMIN D PO Take 1,000 Units by mouth daily.        Contact information for follow-up providers     Celene Squibb, MD Follow up in 1 week(s).   Specialty: Internal Medicine Contact information: [place] [address] [phone]         Tanda Rockers, MD Follow up in 2 week(s).   Specialty: Pulmonary Disease Contact information: [place] [address] [phone]              Contact information for after-discharge care     Destination     [facility] .   Service: Skilled Nursing Contact information: [address] [address] [phone]                    Allergies  Allergen Reactions   Coumadin [Warfarin] Hives   Atorvastatin Other (See Comments)    Muscle aches   Ciprofloxacin    Sulfa Antibiotics Itching and Rash    You were cared for by a hospitalist during your hospital stay. If you have any questions about your discharge medications or the care you received while you were in the hospital after you are discharged, you can call the unit and asked to speak with the hospitalist on call if the hospitalist that took care of you is not available. Once you are discharged, your primary care physician will handle any further medical issues. Please note that no refills for any  discharge medications will be authorized once you are discharged, as it is imperative that you return to your primary care physician (or establish a relationship with a primary care physician if you do not have one) for your aftercare needs so that they can reassess your need for medications and  monitor your lab values.   Procedures/Studies: DG Chest 2 View  Result Date: 11/20/2020 CLINICAL DATA:  Worsening shortness of breath. EXAM: CHEST - 2 VIEW COMPARISON:  Jul 31, 2020 FINDINGS: Stable, mild to moderate severity diffuse chronic appearing increased interstitial lung markings are seen. There is no evidence of a pleural effusion or pneumothorax. The heart size and mediastinal contours are within normal limits. There is mild calcification of the aortic arch. A chronic sixth right rib deformity is seen. Degenerative changes are noted throughout the thoracic spine. IMPRESSION: Chronic interstitial lung disease without evidence of acute or active cardiopulmonary disease. Electronically Signed   By: Virgina Norfolk M.D.   On: 11/20/2020 21:09   CT Angio Chest Pulmonary Embolism (PE) W or WO Contrast  Result Date: 11/22/2020 CLINICAL DATA:  Chest pain shortness of breath, pleurisy or effusion suspected. EXAM: CT ANGIOGRAPHY CHEST WITH CONTRAST TECHNIQUE: Multidetector CT imaging of the chest was performed using the standard protocol during bolus administration of intravenous contrast. Multiplanar CT image reconstructions and MIPs were obtained to evaluate the vascular anatomy. CONTRAST:  51mL OMNIPAQUE IOHEXOL 350 MG/ML SOLN COMPARISON:  Chest radiograph 11/20/2020 FINDINGS: Cardiovascular: Satisfactory opacification of the pulmonary arteries without evidence of pulmonary embolism. Heart size is upper limits of normal. Left atrium is dilated measuring 5.1 cm in the AP dimension. No significant pericardial effusion. Evidence for coronary artery calcifications. Mediastinum/Nodes: Enlarged prevascular lymph  node measuring 1.2 cm in the short axis on sequence 4, image 31. Prominent right hilar lymph node measuring 1.0 cm in the short axis on sequence 4, image 47. Prevascular lymph node measuring 1.2 cm in the short axis on sequence 4, image 27. Difficult to exclude wall thickening in the mid/distal esophagus. Proximal and mid esophagus is distended. Lungs/Pleura: Trachea and mainstem bronchi are patent. Diffuse patchy bilateral airspace disease in both lungs and involving all of the lobes. Calcified nodule along the right minor fissure on sequence 6, image 64 that measures 5 mm. Trace bilateral pleural fluid versus pleural thickening. Upper Abdomen: No acute abnormality in the upper abdomen. Musculoskeletal: No acute bone abnormality. Review of the MIP images confirms the above findings. IMPRESSION: 1. Multifocal pneumonia. Bilateral patchy airspace disease in both lungs. 2. Negative for a pulmonary embolism. 3. Mild mediastinal and right hilar lymphadenopathy. Findings are nonspecific but could be reactive. Consider surveillance. 4. Mild dilatation of the esophagus and questionable mild thickening in the mid/distal esophagus. Findings are nonspecific. 5. Coronary artery calcifications. Electronically Signed   By: Markus Daft M.D.   On: 11/22/2020 17:19   DG Chest Port 1 View  Result Date: 11/26/2020 CLINICAL DATA:  Shortness of breath. EXAM: PORTABLE CHEST 1 VIEW COMPARISON:  November 24, 2020. FINDINGS: The heart size and mediastinal contours are within normal limits. Stable interstitial densities are noted throughout both lungs which may represent scarring, atypical inflammation or edema. The visualized skeletal structures are unremarkable. IMPRESSION: Stable interstitial densities are noted as described above. Electronically Signed   By: Marijo Conception M.D.   On: 11/26/2020 11:31   DG CHEST PORT 1 VIEW  Result Date: 11/24/2020 CLINICAL DATA:  80 year old female with acute shortness of breath. EXAM: PORTABLE  CHEST 1 VIEW COMPARISON:  11/20/2020 FINDINGS: UPPER limits normal heart size again noted. Increased interstitial opacities are identified without focal airspace disease, pleural effusion, pneumothorax or pulmonary mass. No acute bony abnormalities are noted. IMPRESSION: Increased interstitial opacities, question interstitial edema or infection. Electronically Signed   By: Cleatis Polka.D.  On: 11/24/2020 10:26   DG ESOPHAGUS W SINGLE CM (SOL OR THIN BA)  Result Date: 11/26/2020 CLINICAL DATA:  Abnormal dilated esophagus on CT angio chest exam EXAM: ESOPHOGRAM/BARIUM SWALLOW TECHNIQUE: Single contrast examination was performed using thin barium. Patient also swallowed a 12.5 mm diameter barium tablet. FLUOROSCOPY TIME:  Fluoroscopy Time:  1 minute 36 seconds Radiation Exposure Index (if provided by the fluoroscopic device): 38.7 mGy Number of Acquired Spot Images: multiple fluoroscopic screen captures COMPARISON:  CT angio chest 11/22/2020 FINDINGS: Somewhat dilated esophagus throughout its length. Small hiatal hernia. Diffusely impaired esophageal motility with incomplete clearance of barium by primary peristaltic waves and note of scattered secondary and tertiary waves. No evidence of esophageal mass, stricture, or wall irregularity. 12.5 mm diameter barium tablet passed from oral cavity to stomach without obstruction. Atherosclerotic calcifications at aortic arch. Bones appear demineralized. No laryngeal penetration or aspiration of contrast were grossly noted on nondedicated exam. IMPRESSION: Diffuse esophageal dysmotility. No evidence of esophageal mass or obstruction. Hiatal hernia. Electronically Signed   By: Lavonia Dana M.D.   On: 11/26/2020 14:31     The results of significant diagnostics from this hospitalization (including imaging, microbiology, ancillary and laboratory) are listed below for reference.     Microbiology: Recent Results (from the past 240 hour(s))  Resp Panel by RT-PCR (Flu  A&B, Covid) Nasopharyngeal Swab     Status: None   Collection Time: 11/20/20  9:56 PM   Specimen: Nasopharyngeal Swab; Nasopharyngeal(NP) swabs in vial transport medium  Result Value Ref Range Status   SARS Coronavirus 2 by RT PCR NEGATIVE NEGATIVE Final    Comment: (NOTE) SARS-CoV-2 target nucleic acids are NOT DETECTED.  The SARS-CoV-2 RNA is generally detectable in upper respiratory specimens during the acute phase of infection. The lowest concentration of SARS-CoV-2 viral copies this assay can detect is 138 copies/mL. A negative result does not preclude SARS-Cov-2 infection and should not be used as the sole basis for treatment or other patient management decisions. A negative result may occur with  improper specimen collection/handling, submission of specimen other than nasopharyngeal swab, presence of viral mutation(s) within the areas targeted by this assay, and inadequate number of viral copies(<138 copies/mL). A negative result must be combined with clinical observations, patient history, and epidemiological information. The expected result is Negative.  Fact Sheet for Patients:  EntrepreneurPulse.com.au  Fact Sheet for Healthcare Providers:  IncredibleEmployment.be  This test is no t yet approved or cleared by the [place] FDA and  has been authorized for detection and/or diagnosis of SARS-CoV-2 by FDA under an Emergency Use Authorization (EUA). This EUA will remain  in effect (meaning this test can be used) for the duration of the COVID-19 declaration under Section 564(b)(1) of the Act, 21 U.S.C.section 360bbb-3(b)(1), unless the authorization is terminated  or revoked sooner.       Influenza A by PCR NEGATIVE NEGATIVE Final   Influenza B by PCR NEGATIVE NEGATIVE Final    Comment: (NOTE) The Xpert Xpress SARS-CoV-2/FLU/RSV plus assay is intended as an aid in the diagnosis of influenza from Nasopharyngeal swab specimens  and should not be used as a sole basis for treatment. Nasal washings and aspirates are unacceptable for Xpert Xpress SARS-CoV-2/FLU/RSV testing.  Fact Sheet for Patients: EntrepreneurPulse.com.au  Fact Sheet for Healthcare Providers: IncredibleEmployment.be  This test is not yet approved or cleared by the [place] FDA and has been authorized for detection and/or diagnosis of SARS-CoV-2 by FDA under an Emergency Use Authorization (EUA). This EUA  will remain in effect (meaning this test can be used) for the duration of the COVID-19 declaration under Section 564(b)(1) of the Act, 21 U.S.C. section 360bbb-3(b)(1), unless the authorization is terminated or revoked.  Performed at [facility], [address], [place], [place] [address]   MRSA Next Gen by PCR, Nasal     Status: None   Collection Time: 11/24/20  9:25 AM   Specimen: Nasal Mucosa; Nasal Swab  Result Value Ref Range Status   MRSA by PCR Next Gen NOT DETECTED NOT DETECTED Final    Comment: (NOTE) The GeneXpert MRSA Assay (FDA approved for NASAL specimens only), is one component of a comprehensive MRSA colonization surveillance program. It is not intended to diagnose MRSA infection nor to guide or monitor treatment for MRSA infections. Test performance is not FDA approved in patients less than 11 years old. Performed at [facility], [address] [place], [place] [address]      Labs: BNP (last 3 results) Recent Labs    11/23/20 0615 11/24/20 0525 11/25/20 0328  BNP 634.0* 327.0* 128.7*   Basic Metabolic Panel: Recent Labs  Lab 11/24/20 1529 11/25/20 0328 11/26/20 0420 11/27/20 0438 11/28/20 0437 11/29/20 0537  NA 136 139 136 136 135 135  K 3.8 4.1 4.0 4.3 4.3 4.8  CL 103 105 103 103 101 101  CO2 26 22 27 27 27 28   GLUCOSE 178* 143* 136* 128* 127* 121*  BUN 39* 36* 44* 39* 43* 36*  CREATININE 0.81 0.68 0.74 0.74 0.84 0.69  CALCIUM 7.8* 8.1* 8.1* 8.1* 8.0* 7.9*   MG 2.3  --   --  2.5* 2.5* 2.5*   Liver Function Tests: No results for input(s): AST, ALT, ALKPHOS, BILITOT, PROT, ALBUMIN in the last 168 hours. No results for input(s): LIPASE, AMYLASE in the last 168 hours. No results for input(s): AMMONIA in the last 168 hours. CBC: Recent Labs  Lab 11/23/20 0615 11/24/20 0525 11/25/20 0328 11/26/20 0420 11/27/20 0438 11/28/20 0437 11/29/20 0537  WBC 15.0*   < > 10.9* 10.8* 11.1* 10.7* 11.3*  NEUTROABS 13.5*  --   --   --   --   --   --   HGB 8.6*   < > 9.5* 9.7* 10.0* 9.7* 9.7*  HCT 28.8*   < > 31.8* 31.7* 32.9* 33.0* 31.7*  MCV 94.7   < > 91.6 89.5 90.9 90.7 91.4  PLT 363   < > 374 429* 485* 475* 499*   < > = values in this interval not displayed.   Cardiac Enzymes: No results for input(s): CKTOTAL, CKMB, CKMBINDEX, TROPONINI in the last 168 hours. BNP: Invalid input(s): POCBNP CBG: Recent Labs  Lab 11/25/20 1116  GLUCAP 152*   D-Dimer No results for input(s): DDIMER in the last 72 hours. Hgb A1c No results for input(s): HGBA1C in the last 72 hours. Lipid Profile No results for input(s): CHOL, HDL, LDLCALC, TRIG, CHOLHDL, LDLDIRECT in the last 72 hours. Thyroid function studies No results for input(s): TSH, T4TOTAL, T3FREE, THYROIDAB in the last 72 hours.  Invalid input(s): FREET3 Anemia work up No results for input(s): VITAMINB12, FOLATE, FERRITIN, TIBC, IRON, RETICCTPCT in the last 72 hours. Urinalysis    Component Value Date/Time   APPEARANCEUR Clear 09/28/2020 1121   GLUCOSEU Negative 09/28/2020 1121   BILIRUBINUR Negative 09/28/2020 1121   PROTEINUR Negative 09/28/2020 1121   NITRITE Negative 09/28/2020 1121   LEUKOCYTESUR 1+ (A) 09/28/2020 1121   Sepsis Labs Invalid input(s): PROCALCITONIN,  WBC,  [facility] Microbiology Recent Results (from the past 240 hour(s))  Resp Panel by RT-PCR (Flu A&B, Covid) Nasopharyngeal Swab     Status: None   Collection Time: 11/20/20  9:56 PM   Specimen: Nasopharyngeal Swab;  Nasopharyngeal(NP) swabs in vial transport medium  Result Value Ref Range Status   SARS Coronavirus 2 by RT PCR NEGATIVE NEGATIVE Final    Comment: (NOTE) SARS-CoV-2 target nucleic acids are NOT DETECTED.  The SARS-CoV-2 RNA is generally detectable in upper respiratory specimens during the acute phase of infection. The lowest concentration of SARS-CoV-2 viral copies this assay can detect is 138 copies/mL. A negative result does not preclude SARS-Cov-2 infection and should not be used as the sole basis for treatment or other patient management decisions. A negative result may occur with  improper specimen collection/handling, submission of specimen other than nasopharyngeal swab, presence of viral mutation(s) within the areas targeted by this assay, and inadequate number of viral copies(<138 copies/mL). A negative result must be combined with clinical observations, patient history, and epidemiological information. The expected result is Negative.  Fact Sheet for Patients:  EntrepreneurPulse.com.au  Fact Sheet for Healthcare Providers:  IncredibleEmployment.be  This test is no t yet approved or cleared by the [place] FDA and  has been authorized for detection and/or diagnosis of SARS-CoV-2 by FDA under an Emergency Use Authorization (EUA). This EUA will remain  in effect (meaning this test can be used) for the duration of the COVID-19 declaration under Section 564(b)(1) of the Act, 21 U.S.C.section 360bbb-3(b)(1), unless the authorization is terminated  or revoked sooner.       Influenza A by PCR NEGATIVE NEGATIVE Final   Influenza B by PCR NEGATIVE NEGATIVE Final    Comment: (NOTE) The Xpert Xpress SARS-CoV-2/FLU/RSV plus assay is intended as an aid in the diagnosis of influenza from Nasopharyngeal swab specimens and should not be used as a sole basis for treatment. Nasal washings and aspirates are unacceptable for Xpert Xpress  SARS-CoV-2/FLU/RSV testing.  Fact Sheet for Patients: EntrepreneurPulse.com.au  Fact Sheet for Healthcare Providers: IncredibleEmployment.be  This test is not yet approved or cleared by the [place] FDA and has been authorized for detection and/or diagnosis of SARS-CoV-2 by FDA under an Emergency Use Authorization (EUA). This EUA will remain in effect (meaning this test can be used) for the duration of the COVID-19 declaration under Section 564(b)(1) of the Act, 21 U.S.C. section 360bbb-3(b)(1), unless the authorization is terminated or revoked.  Performed at [facility], [address], [place], [place] [address]   MRSA Next Gen by PCR, Nasal     Status: None   Collection Time: 11/24/20  9:25 AM   Specimen: Nasal Mucosa; Nasal Swab  Result Value Ref Range Status   MRSA by PCR Next Gen NOT DETECTED NOT DETECTED Final    Comment: (NOTE) The GeneXpert MRSA Assay (FDA approved for NASAL specimens only), is one component of a comprehensive MRSA colonization surveillance program. It is not intended to diagnose MRSA infection nor to guide or monitor treatment for MRSA infections. Test performance is not FDA approved in patients less than 17 years old. Performed at [facility], [address] [place], [place] [address]      Time coordinating discharge:  I have spent 35 minutes face to face with the patient and on the ward discussing the patients care, assessment, plan and disposition with other care givers. >50% of the time was devoted counseling the patient about the risks and benefits of treatment/Discharge disposition and  coordinating care.   SIGNED:   Damita Lack, MD  [facility] 11/29/2020, 11:23 AM   If 7PM-7AM, please contact night-coverage

## 2020-11-30 ENCOUNTER — Telehealth: Payer: Self-pay | Admitting: Internal Medicine

## 2020-11-30 ENCOUNTER — Encounter (HOSPITAL_COMMUNITY): Payer: Self-pay | Admitting: Internal Medicine

## 2020-11-30 NOTE — Telephone Encounter (Signed)
Okay to double book visit with me in [place].

## 2020-11-30 NOTE — Telephone Encounter (Signed)
Spoke with [name] (daughter per [facility]) Pt was discharged to a rehab facility in [place] and daughter is concerned about hospital f/u being in [place] and daughter would not be able to attend. Pt's discharge paperwork does say f/u with Dr. Melvyn Novas or Dr. Halford Chessman in 10 - 14 days. Neither doctor has a opening until the first week in November. Dr. Melvyn Novas and Dr. Halford Chessman could you please advise. Pt is scheduled in [place] with Dr. Melvyn Novas on 12/05/20.

## 2020-12-03 LAB — SURGICAL PATHOLOGY

## 2020-12-03 NOTE — Telephone Encounter (Signed)
ATC, left VM. 

## 2020-12-03 NOTE — Telephone Encounter (Signed)
I called the facility and per Rushie Goltz the patient has been scheduled for a follow up in the office and the daughter did not wan to wait until Dr. Halford Chessman was back in the office. Appointment has been made for 12/05/2020. No other concerns.

## 2020-12-04 ENCOUNTER — Telehealth: Payer: Self-pay

## 2020-12-04 NOTE — Telephone Encounter (Signed)
Nurse from [facility]) called to clarify if patient was suppose to be on both Trospium and Gemtesa. Nurse states patient never started the Trospium and [name] has been working for patient. Per last ov note ok for patient to continue Gemtsa. Order given to d/c Trospium.

## 2020-12-05 ENCOUNTER — Ambulatory Visit (INDEPENDENT_AMBULATORY_CARE_PROVIDER_SITE_OTHER): Payer: Medicare Other

## 2020-12-05 ENCOUNTER — Other Ambulatory Visit: Payer: Self-pay

## 2020-12-05 ENCOUNTER — Encounter: Payer: Self-pay | Admitting: Internal Medicine

## 2020-12-05 ENCOUNTER — Ambulatory Visit (INDEPENDENT_AMBULATORY_CARE_PROVIDER_SITE_OTHER): Payer: Medicare Other | Admitting: Internal Medicine

## 2020-12-05 DIAGNOSIS — J189 Pneumonia, unspecified organism: Secondary | ICD-10-CM | POA: Diagnosis not present

## 2020-12-05 DIAGNOSIS — R0609 Other forms of dyspnea: Secondary | ICD-10-CM

## 2020-12-05 DIAGNOSIS — R0600 Dyspnea, unspecified: Secondary | ICD-10-CM

## 2020-12-05 DIAGNOSIS — J9611 Chronic respiratory failure with hypoxia: Secondary | ICD-10-CM

## 2020-12-05 DIAGNOSIS — R0602 Shortness of breath: Secondary | ICD-10-CM | POA: Diagnosis not present

## 2020-12-05 DIAGNOSIS — T50905A Adverse effect of unspecified drugs, medicaments and biological substances, initial encounter: Secondary | ICD-10-CM | POA: Diagnosis not present

## 2020-12-05 LAB — CBC WITH DIFFERENTIAL/PLATELET
Basophils Absolute: 0.1 10*3/uL (ref 0.0–0.1)
Basophils Relative: 0.6 % (ref 0.0–3.0)
Eosinophils Absolute: 0 10*3/uL (ref 0.0–0.7)
Eosinophils Relative: 0 % (ref 0.0–5.0)
HCT: 35.8 % — ABNORMAL LOW (ref 36.0–46.0)
Hemoglobin: 10.9 g/dL — ABNORMAL LOW (ref 12.0–15.0)
Lymphocytes Relative: 12.1 % (ref 12.0–46.0)
Lymphs Abs: 1.9 10*3/uL (ref 0.7–4.0)
MCHC: 30.5 g/dL (ref 30.0–36.0)
MCV: 88.5 fl (ref 78.0–100.0)
Monocytes Absolute: 1.1 10*3/uL — ABNORMAL HIGH (ref 0.1–1.0)
Monocytes Relative: 6.7 % (ref 3.0–12.0)
Neutro Abs: 12.9 10*3/uL — ABNORMAL HIGH (ref 1.4–7.7)
Neutrophils Relative %: 80.6 % — ABNORMAL HIGH (ref 43.0–77.0)
Platelets: 590 10*3/uL — ABNORMAL HIGH (ref 150.0–400.0)
RBC: 4.04 Mil/uL (ref 3.87–5.11)
RDW: 16.6 % — ABNORMAL HIGH (ref 11.5–15.5)
WBC: 16 10*3/uL — ABNORMAL HIGH (ref 4.0–10.5)

## 2020-12-05 LAB — BASIC METABOLIC PANEL
BUN: 29 mg/dL — ABNORMAL HIGH (ref 6–23)
CO2: 27 mEq/L (ref 19–32)
Calcium: 8.6 mg/dL (ref 8.4–10.5)
Chloride: 102 mEq/L (ref 96–112)
Creatinine, Ser: 0.92 mg/dL (ref 0.40–1.20)
GFR: 59.82 mL/min — ABNORMAL LOW (ref >60.00)
Glucose, Bld: 100 mg/dL — ABNORMAL HIGH (ref 70–99)
Potassium: 4.7 mEq/L (ref 3.5–5.1)
Sodium: 136 mEq/L (ref 135–145)

## 2020-12-05 LAB — BRAIN NATRIURETIC PEPTIDE: Pro B Natriuretic peptide (BNP): 277 pg/mL — ABNORMAL HIGH (ref 0.0–100.0)

## 2020-12-05 LAB — SEDIMENTATION RATE: Sed Rate: 19 mm/hr (ref 0–30)

## 2020-12-05 IMAGING — DX DG CHEST 2V
2 series · 2 of 2 positions shown · non-contrast
Comparison: [DATE]

CLINICAL DATA: Shortness of breath, history atrial fibrillation,
diastolic dysfunction, hypertension

EXAM:
CHEST - 2 VIEW

[chest ap]
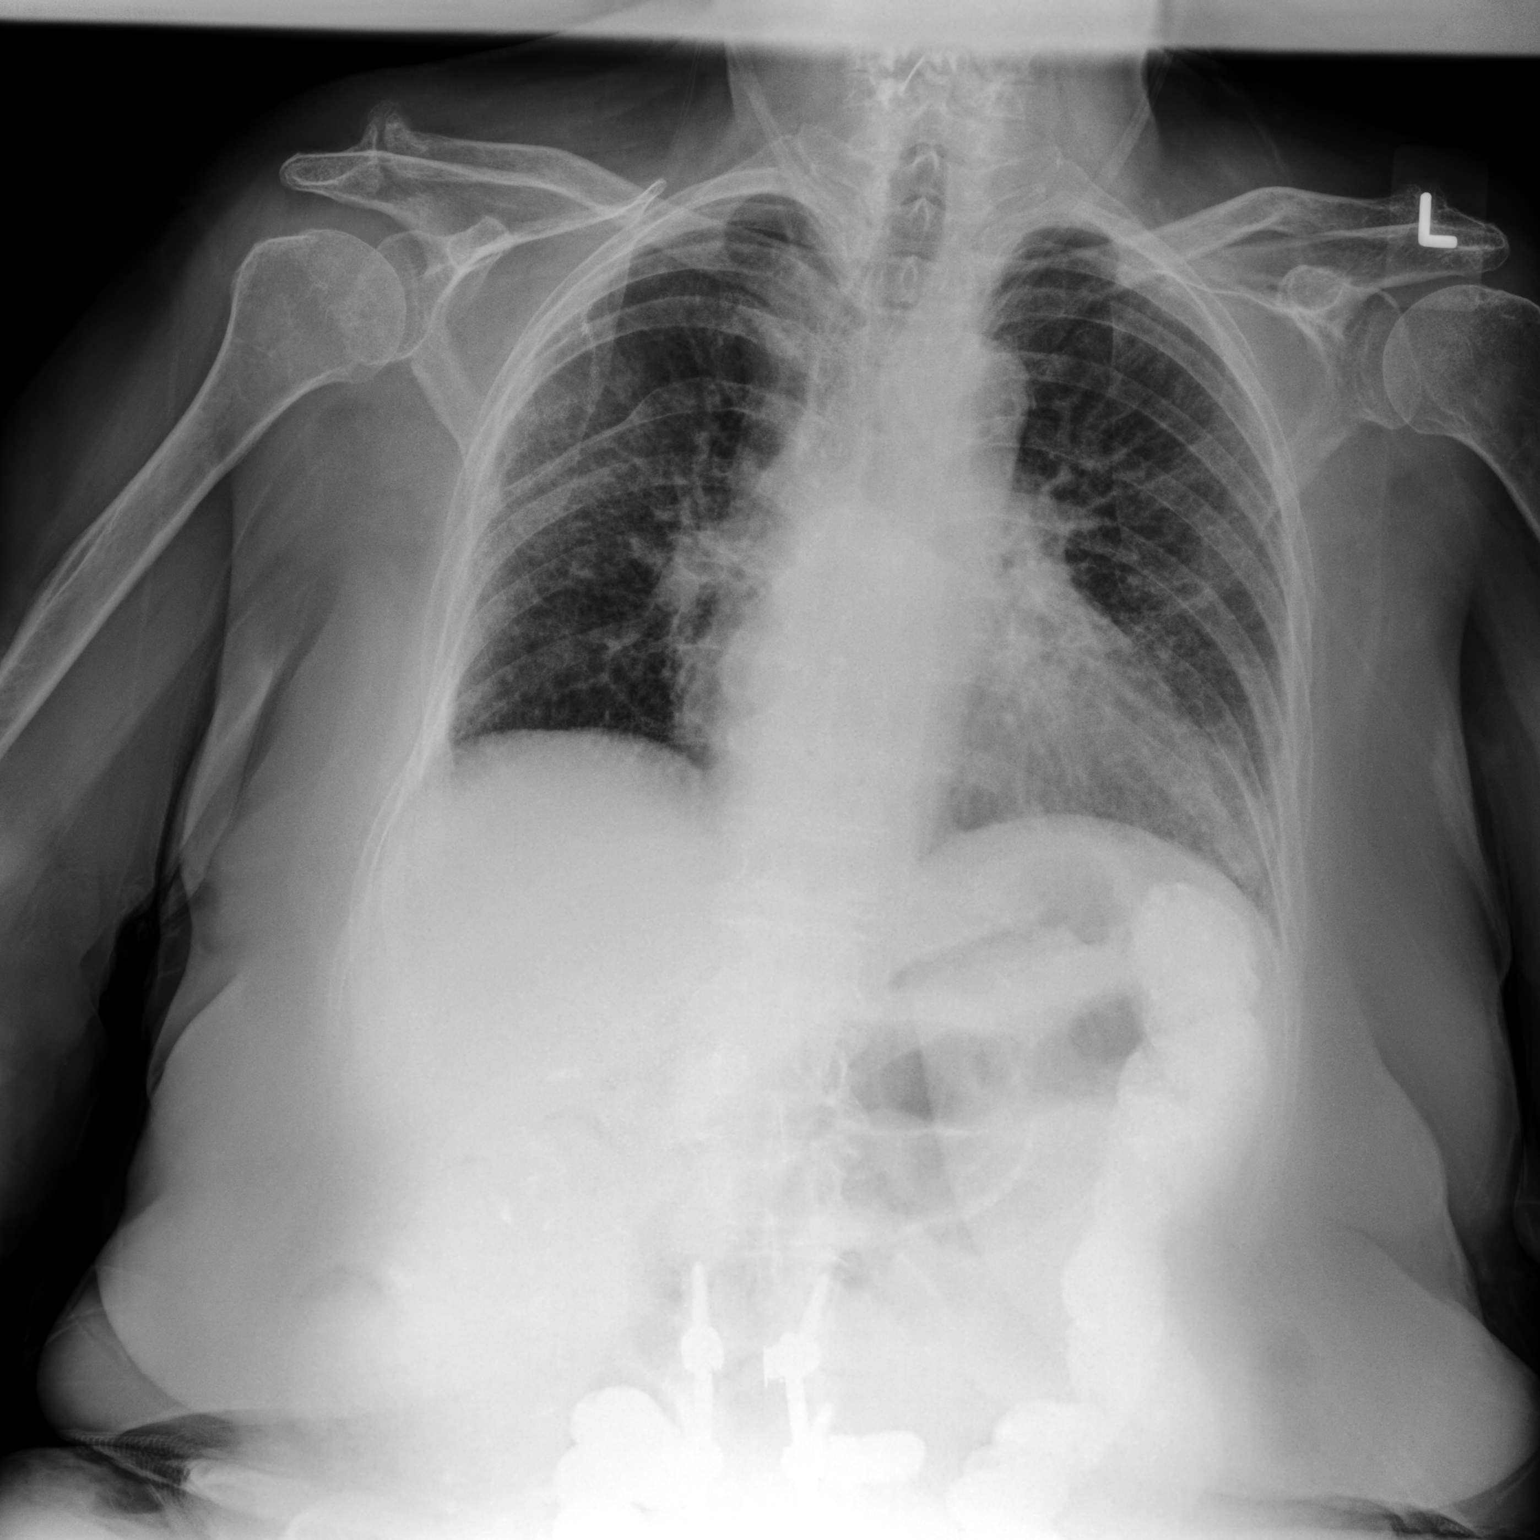

[chest lat]
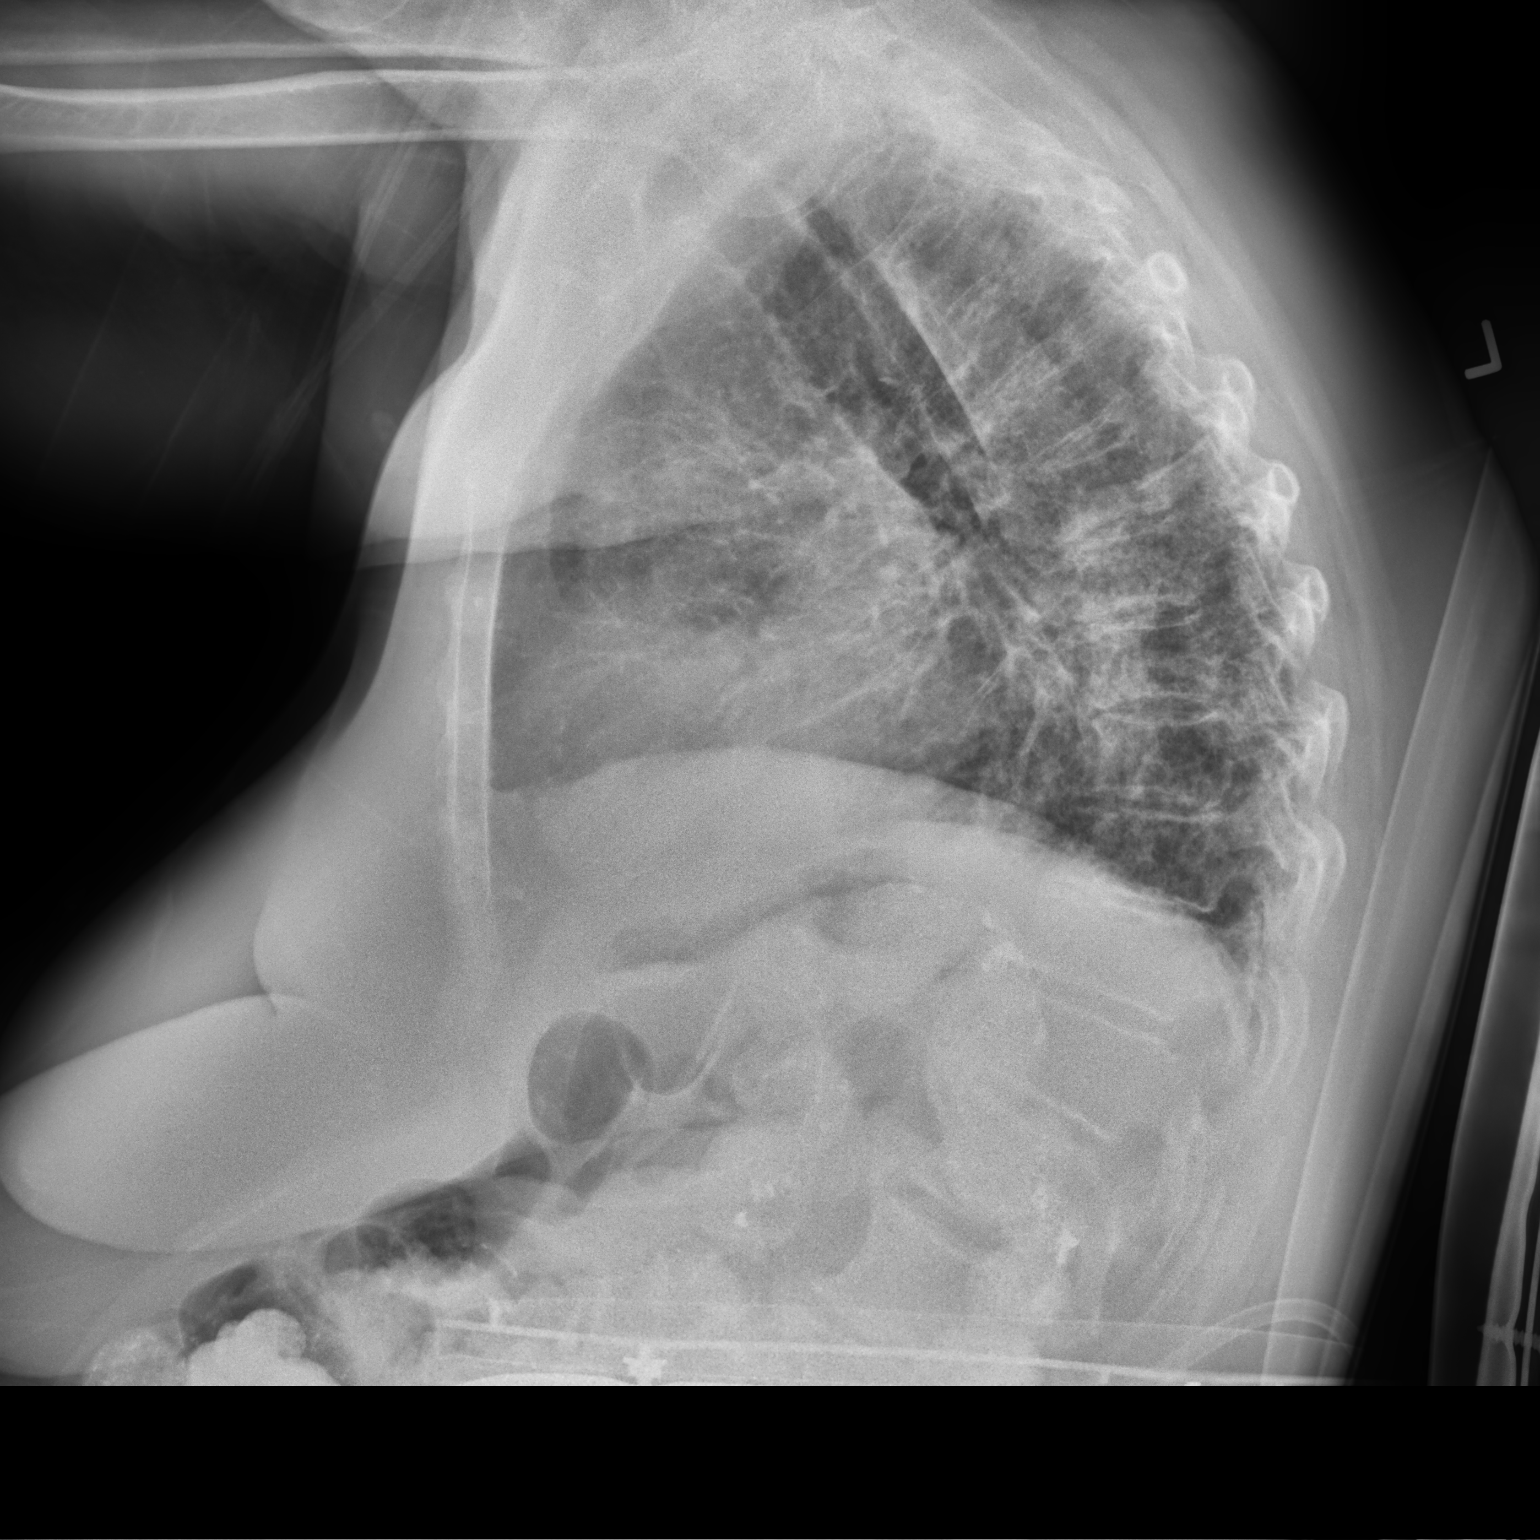

[2 of 2 positions shown; findings below may reference images not displayed]

FINDINGS: Enlargement of cardiac silhouette.

Mediastinal contours and pulmonary vascularity normal.

Atherosclerotic calcification aorta.

Chronic interstitial changes throughout both lungs.

No pulmonary infiltrate, pleural effusion, or pneumothorax.

Retained contrast in colon.

Prior lumbar fusion and chronic deformity of the posterior RIGHT
sixth rib question postsurgical.

No acute osseous findings.
IMPRESSION: Chronic interstitial lung disease changes.

Aortic Atherosclerosis ([SP]-[SP]).

## 2020-12-05 NOTE — Patient Instructions (Addendum)
Stop the routine breathing treatments  =  albtuerol neb and inhalers  Change the  levoalbuterol 1.25 mg   vial up to every 4 hours as needed for wheeze/ coughing short of  breath  Adjust 02 to keep sats  > 90%  Please schedule a follow up office visit in 4 weeks, sooner if needed

## 2020-12-05 NOTE — Progress Notes (Signed)
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 Transiently may drop with Physical therapy, ok to increase it.  Avoid further use of Nitrofurantoin Eliquis 5mg  po bid Encourage Use of Incentive spirometry and flutter valve Prednisone taper ordered over 2 weeks      Discharge Condition: Stable CODE STATUS: Full Code  Diet recommendation: Heart healthy   Brief/Interim Summary: 80 year old with history of essential tremor, prior CVA, GERD, overactive bladder came to the hospital for evaluation of hypoxia and progressive shortness of breath.  Patient was diagnosed with likely bronchitis with intermittent fluid overload requiring Lasix.  There is a possibility patient may have drug-related toxicity from nitrofurantoin.  Seen by pulmonary.Hospital stay also complicated by A. fib with RVR started on Cardizem drip. Now she is on Propranolol.  Due to dysphagia she had barium swallow which showed esophageal motility disorder therefore GI plans for endoscopy 11/29/2020.  EGD showed benign stenosis requiring dilation.  Eliquis was resumed on 11/29/2020.  PT recommended SNF therefore arrangements were made.  Rest the recommendations as stated above.     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  Due to poor lung compliance, expect transiently become hypoxic while working with physical therapy but should recover.  Okay to increase supplemental oxygen if necessary in the meantime. -CT of the chest was reviewed, chronic interstitial lung disease identified, negative for pulmonary embolism, possible multifocal pneumonia - Pulmonary recommends prednisone taper over 2 weeks and follow-up with their service in 10-14 days.  Continue bronchodilators as necessary to be used post discharge.  A. fib with RVR -history of A. fib - Continue propranolol and Eliquis.  Cardizem PO   Dysphagia - Barium swallow showed esophageal motility disorder.  Endoscopy performed 9/21 showed benign esophageal stenosis and possible short segment of Barrett's esophagus.  Biopsies were performed along with dilation.   Essential tremor -continue propanolol -Stable  History of CVA -Stable, continue Eliquis,  -Not on any statins    GERD Continue Protonix Stable   Overactive bladder Continue trospium and vibegron Stable    Insomnia Continue melatonin.  Would avoid Benadryl or any other medications on beers list   Obesity (BMI 34.96) Patient will be counseled on diet and lifestyle modification when more stable   Chronic anemia -iron deficiency anemia - Will require outpatient colonoscopy once she is recovered from her acute illness      History of Present Illness  12/05/2020  Pulmonary/ 1st office eval/Monique Robles ? Macrodantin lung -was on it from Jan 2022 to 11/21/20  Chief Complaint  Patient presents with   Hospitalization Follow-up  Dyspnea:  baseline half way up steps, now SNF and 02 now 2lpm at rest 4-5  lpm with activity  Cough: minimal Sleep: 30 degrees electric bed  SABA use: multiple forms complicated by palpitations ? RAF   No obvious day to day or daytime variability or assoc excess/ purulent sputum or mucus plugs or hemoptysis or cp or chest tightness, subjective wheeze or overt sinus or hb symptoms.   Sleeping  without nocturnal  or early am exacerbation  of respiratory  c/o's or need for noct saba. Also denies any obvious fluctuation of symptoms with weather or environmental changes or other aggravating or alleviating factors except as outlined above   No unusual exposure hx or h/o childhood pna/ asthma or knowledge of premature birth.  Current Allergies, Complete Past Medical History, Past Surgical History, Family History, and Social History were reviewed in [employer] record.  ROS  The following are not active complaints unless bolded Hoarseness, sore throat, dysphagia, dental problems, itching, sneezing,  nasal congestion or discharge of excess mucus or purulent secretions, ear ache,   fever, chills, sweats, unintended wt loss or wt gain, classically pleuritic or exertional cp,  orthopnea pnd or arm/hand swelling  or leg swelling, presyncope, palpitations, abdominal pain, anorexia, nausea, vomiting, diarrhea  or change in bowel habits or change in bladder habits, change in stools or change in urine, dysuria, hematuria,  rash, arthralgias, visual complaints, headache, numbness, weakness or ataxia or problems with walking or coordination,  change in mood or  memory.           Past Medical History:  Diagnosis Date   Aortic insufficiency    Atrial fibrillation (HCC)    Diastolic dysfunction    DOE (dyspnea on exertion)    GERD (gastroesophageal reflux disease)    History of pneumonia    History of pulmonary embolism    History of stroke    HTN (hypertension)    Hyperlipemia    Mitral valve regurgitation    Osteoporosis    Tremor    Vitamin D  deficiency     Outpatient Medications Prior to Visit  Medication Sig Dispense Refill   Acetaminophen (TYLENOL ARTHRITIS PAIN PO) Take 650 mg by mouth daily as needed (pain).     albuterol (PROVENTIL) (2.5 MG/3ML) 0.083% nebulizer solution Take 2.5 mg by nebulization every 4 (four) hours as needed for wheezing or shortness of breath.     albuterol (VENTOLIN HFA) 108 (90 Base) MCG/ACT inhaler Inhale 1-2 puffs into the lungs every 4 (four) hours as needed for shortness of breath or wheezing.     apixaban (ELIQUIS) 5 MG TABS tablet Take 5 mg by mouth 2 (two) times daily.     calcium carbonate (TUMS - DOSED IN MG ELEMENTAL CALCIUM) 500 MG chewable tablet Chew 1 tablet by mouth daily as needed for indigestion or heartburn.     cyclobenzaprine (FLEXERIL) 5 MG tablet Take 5 mg by mouth daily as needed.     diltiazem (CARDIZEM) 30 MG tablet Take 1 tablet (30 mg total) by mouth every 8 (eight) hours.     diphenhydrAMINE HCl (BENADRYL PO) Take 1 tablet by mouth as needed (seasonal allergies).     furosemide (LASIX) 20 MG tablet Take 20 mg by mouth daily.     gabapentin (NEURONTIN) 600 MG tablet  Take 600 mg by mouth 3 (three) times daily.     HYDROcodone-acetaminophen (NORCO/VICODIN) 5-325 MG tablet Take 1 tablet by mouth 2 (two) times daily as needed for moderate pain. 15 tablet 0   ipratropium (ATROVENT) 0.02 % nebulizer solution Take 2.5 mLs (0.5 mg total) by nebulization every 6 (six) hours as needed for wheezing or shortness of breath. 75 mL 12   lactobacillus (FLORANEX/LACTINEX) PACK Take 1 packet (1 g total) by mouth 3 (three) times daily with meals.     levalbuterol (XOPENEX) 1.25 MG/0.5ML nebulizer solution Take 1.25 mg by nebulization every 6 (six) hours as needed for wheezing or shortness of breath. 1 each 12   Melatonin 10 MG TABS Take 10 mg by mouth at bedtime.     Multiple Vitamin (MULTIVITAMIN) tablet Take 1 tablet by mouth daily.     omeprazole (PRILOSEC) 40 MG capsule Take 1 capsule by  mouth daily.     predniSONE (DELTASONE) 10 MG tablet Take 4 tablets (40 mg total) by mouth daily for 2 days, THEN 4 tablets (40 mg total) daily with breakfast for 3 days, THEN 3 tablets (30 mg total) daily with breakfast for 3 days, THEN 2 tablets (20 mg total) daily with breakfast for 3 days, THEN 1 tablet (10 mg total) daily with breakfast for 3 days. 38 tablet 0   propranolol (INDERAL) 40 MG tablet Take 1 tablet (40 mg total) by mouth 2 (two) times daily. 180 tablet 4   senna-docusate (SENOKOT-S) 8.6-50 MG tablet Take 2 tablets by mouth at bedtime as needed for moderate constipation.     Trospium Chloride 60 MG CP24 Take 1 capsule (60 mg total) by mouth daily. (Patient not taking: No sig reported) 30 capsule 0   Vibegron (GEMTESA) 75 MG TABS Take 1 capsule by mouth daily. 30 tablet 11   VITAMIN D PO Take 1,000 Units by mouth daily.     No facility-administered medications prior to visit.     Objective:     BP 128/76 (BP Location: Left Arm, Cuff Size: Large)   Pulse 84   SpO2 95% Comment: 2lpm cont  SpO2: 95 % (2lpm cont) O2 Type: Continuous O2 O2 Flow Rate (L/min): 2 L/min  W/c bound elderly chronically ill appearing wf  nad at rest    HEENT : pt wearing mask not removed for exam due to covid -19 concerns.    NECK :  without JVD/Nodes/TM/ nl carotid upstrokes bilaterally   LUNGS: no acc muscle use,  Nl contour chest which is clear to A and P bilaterally without cough on insp or exp maneuvers   CV:  IRIR around 100   no s3 or murmur or increase in P2, and no edema   ABD:  soft and nontender with nl inspiratory excursion in the supine position. No bruits or organomegaly appreciated, bowel sounds nl  MS:   Ext warm without deformities, calf tenderness, cyanosis or clubbing No obvious joint restrictions   SKIN: warm and dry without lesions    NEURO:  alert, approp, nl sensorium with  no motor or cerebellar deficits apparent.    CT chest 02/23/17 >> post operative changes  Rt lung, 5 mm nodule RUL Echo 09/14/20 >> EF 65 to 70%, grade 1 DD, mild LA dilation, mild MR, mild AS CT angio chest 11/22/20 >> prominent LN, diffuse patchy b/l GGO air space disease, mild esophageal dilation ANA/ ANCA/RA 9/16 22 neg  Labs ordered/ reviewed:      Chemistry  Component Value Date/Time   NA 136 12/05/2020 1636   K 4.7 12/05/2020 1636   CL 102 12/05/2020 1636   CO2 27 12/05/2020 1636   BUN 29 (H) 12/05/2020 1636   CREATININE 0.92 12/05/2020 1636      Component Value Date/Time   CALCIUM 8.6 12/05/2020 1636   ALKPHOS 85 11/21/2020 0414   AST 18 11/21/2020 0414   ALT 9 11/21/2020 0414   BILITOT 0.5 11/21/2020 0414        Lab Results  Component Value Date   WBC 16.0 (H) 12/05/2020   HGB 10.9 (L) 12/05/2020   HCT 35.8 (L) 12/05/2020   MCV 88.5 12/05/2020   PLT 590.0 (H) 12/05/2020     No results found for: [facility]    Lab Results  Component Value Date   TSH 1.773 11/21/2020     Lab Results  Component Value Date   PROBNP 277.0 (H) 12/05/2020       Lab Results  Component Value Date   ESRSEDRATE 19 12/05/2020   ESRSEDRATE 35 (H) 11/25/2020   ESRSEDRATE 45 (H) 11/23/2020        CXR PA and Lateral:   12/05/2020 :    I personally reviewed images   impression as follows:    Decreased lung volumes, slt improved aeration vs admit portables      Assessment   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 -was on it from Jan 2022 to 11/21/20  Lab Results  Component Value Date   ESRSEDRATE 19 12/05/2020   ESRSEDRATE 35 (H) 11/25/2020   ESRSEDRATE 45 (H) 11/23/2020     Clearly the acute pneumonitis has improved though most likely has a component of fibrosis that won't improve with steroids so best bet is to wean slowly off and see at what point if any her symptoms flare  NB .The goal with a chronic steroid dependent illness is always arriving at the lowest effective dose that controls the disease/symptoms and not accepting a set "formula" which is  based on statistics or guidelines that don't always take into account patient  variability or the natural hx of the dz in every individual patient, which may well vary over time.  For now therefore I recommend the patient taper completely off pred over next 2 weeks.    DOE (dyspnea on exertion) She does not have demonstrable copd or asthma and if she did have airflow obst clinically the first thing I would do is rx with levoalbuterol to to offset the propanolol and not aggravate the AF but if persisted then d/c propranolol altogether and put up with more tremor.  She also does not have significant chf at this point  though RAF is a concern in pt with diastolic dysfunction MR and and AS so will need good control of rate and not push her too hard with rehab.    Chronic respiratory failure with hypoxia (HCC) 2lpm at rest 4-5 lpm with activity as of 12/05/2020   Advised on goal of keeping sats > 90% by either titrating up the 02 or decreasing the level of exertion for now       Each maintenance medication was reviewed in detail including emphasizing most importantly the difference between maintenance and prns and under what circumstances the prns are to be triggered using an action plan format where appropriate.  Total time for H and P, chart review, counseling, reviewing neb/02 device(s) and generating customized AVS unique to this complex post hosp office visit / same day charting  >  30 min       Outpatient Encounter Medications as of 12/05/2020  Medication Sig   Acetaminophen (TYLENOL ARTHRITIS PAIN PO) Take 650 mg by mouth daily as needed (pain).   apixaban (ELIQUIS) 5 MG TABS tablet Take 5 mg by mouth 2 (two) times daily.   calcium carbonate (TUMS - DOSED IN MG ELEMENTAL CALCIUM) 500 MG chewable tablet Chew 1 tablet by mouth daily as needed for indigestion or heartburn.   cyclobenzaprine (FLEXERIL) 5 MG tablet Take 5 mg by mouth daily as needed.   diltiazem (CARDIZEM) 30 MG tablet Take 1  tablet (30 mg total) by mouth every 8 (eight) hours.   diphenhydrAMINE HCl (BENADRYL PO) Take 1 tablet by mouth as needed (seasonal allergies).   furosemide (LASIX) 20 MG tablet Take 20 mg by mouth daily.   gabapentin (NEURONTIN) 600 MG tablet Take 600 mg by mouth 3 (three) times daily.   HYDROcodone-acetaminophen (NORCO/VICODIN) 5-325 MG tablet Take 1 tablet by mouth 2 (two) times daily as needed for moderate pain.   lactobacillus (FLORANEX/LACTINEX) PACK Take 1 packet (1 g total) by mouth 3 (three) times daily with meals.   levalbuterol (XOPENEX) 1.25 MG/0.5ML nebulizer solution Take 1.25 mg by nebulization every 6 (six) hours as needed for wheezing or shortness of breath.   Melatonin 10 MG TABS Take 10 mg by mouth at bedtime.   Multiple Vitamin (MULTIVITAMIN) tablet Take 1 tablet by mouth daily.   omeprazole (PRILOSEC) 40 MG capsule Take 1 capsule by mouth daily.   predniSONE (DELTASONE) 10 MG tablet Take 4 tablets (40 mg total) by mouth daily for 2 days, THEN 4 tablets (40 mg total) daily with breakfast for 3 days, THEN 3 tablets (30 mg total) daily with breakfast for 3 days, THEN 2 tablets (20 mg total) daily with breakfast for 3 days, THEN 1 tablet (10 mg total) daily with breakfast for 3 days.   propranolol (INDERAL) 40 MG tablet Take 1 tablet (40 mg total) by mouth 2 (two) times daily.   senna-docusate (SENOKOT-S) 8.6-50 MG tablet Take 2 tablets by mouth at bedtime as needed for moderate constipation.   Trospium Chloride 60 MG CP24 Take 1 capsule (60 mg total) by mouth daily. (Patient not taking: No sig reported)   Vibegron (GEMTESA) 75 MG TABS Take 1 capsule by mouth daily.   VITAMIN D PO Take 1,000 Units by mouth daily.   [DISCONTINUED] albuterol (PROVENTIL) (2.5 MG/3ML) 0.083% nebulizer solution Take 2.5 mg by nebulization every 4 (four) hours as needed for wheezing or shortness of breath.   [DISCONTINUED] albuterol (VENTOLIN HFA) 108 (90 Base) MCG/ACT inhaler Inhale 1-2 puffs into the  lungs every 4 (four) hours as needed for shortness of breath or wheezing.   [DISCONTINUED] ipratropium (ATROVENT) 0.02 % nebulizer solution Take 2.5 mLs (0.5 mg total) by nebulization every 6 (six) hours as needed for wheezing or shortness of breath.   No facility-administered encounter medications on file as of 12/05/2020.     Christinia Gully, MD 12/05/2020

## 2020-12-06 ENCOUNTER — Encounter: Payer: Self-pay | Admitting: Internal Medicine

## 2020-12-06 DIAGNOSIS — J9611 Chronic respiratory failure with hypoxia: Secondary | ICD-10-CM | POA: Insufficient documentation

## 2020-12-06 NOTE — Assessment & Plan Note (Addendum)
2lpm at rest 4-5 lpm with activity as of 12/05/2020   Advised on goal of keeping sats > 90% by either titrating up the 02 or decreasing the level of exertion for now          Each maintenance medication was reviewed in detail including emphasizing most importantly the difference between maintenance and prns and under what circumstances the prns are to be triggered using an action plan format where appropriate.  Total time for H and P, chart review, counseling, reviewing neb/02 device(s) and generating customized AVS unique to this complex post hosp office visit / same day charting  > 30 min

## 2020-12-06 NOTE — Assessment & Plan Note (Signed)
She does not have demonstrable copd or asthma and if she did have airflow obst clinically the first thing I would do is rx with levoalbuterol to to offset the propanolol and not aggravate the AF but if persisted then d/c propranolol altogether and put up with more tremor.  She also does not have significant chf at this point  though RAF is a concern in pt with diastolic dysfunction MR and and AS so will need good control of rate and not push her too hard with rehab.

## 2020-12-06 NOTE — Assessment & Plan Note (Addendum)
?   Macrodantin lung -was on it from Jan 2022 to 11/21/20  Lab Results  Component Value Date   ESRSEDRATE 19 12/05/2020   ESRSEDRATE 35 (H) 11/25/2020   ESRSEDRATE 45 (H) 11/23/2020     Clearly the acute pneumonitis has improved though most likely has a component of fibrosis that won't improve with steroids so best bet is to wean slowly off and see at what point if any her symptoms flare  NB .The goal with a chronic steroid dependent illness is always arriving at the lowest effective dose that controls the disease/symptoms and not accepting a set "formula" which is based on statistics or guidelines that don't always take into account patient  variability or the natural hx of the dz in every individual patient, which may well vary over time.  For now therefore I recommend the patient taper completely off pred over next 2 weeks.

## 2020-12-07 ENCOUNTER — Telehealth: Payer: Self-pay | Admitting: Cardiology

## 2020-12-07 NOTE — Telephone Encounter (Signed)
Spoke with daughter [name] [place]) - explained to her that since her mom is " in house " at [facility] that she should discuss care with the nurses taking care of her first as they are the ones evaluating her onsite.  I did offer her an earlier appointment with NP for 12/17/2020, but she states she is not sure what to do.  Advised her to discuss further with staff taking care of her mom & can call back on Monday if she wants to change the appointment.  Daughter verbalized understanding.

## 2020-12-07 NOTE — Telephone Encounter (Signed)
Patient c/o Palpitations:  High priority if patient c/o lightheadedness, shortness of breath, or chest pain  How long have you had palpitations/irregular HR/ Afib? Are you having the symptoms now? Since her D.C from [place] you currently experiencing lightheadedness, SOB or CP? no  Do you have a history of afib (atrial fibrillation) or irregular heart rhythm? yes  Have you checked your BP or HR? (document readings if available): patient is in reh at [facility].   Are you experiencing any other symptoms? Daughter called stating that patient was discharged from [facility]. Was transferred to [facility] and since the transfer patient's HR and BP are either too high or too low.   [phone] [name])   Appt made with Dr. Harl Bowie but daughter is very concerned if she needs to go back to the hospital.

## 2020-12-13 NOTE — Progress Notes (Signed)
Spoke with daughter and she states MW called her and already answered all of her questions. Nothing further needed.

## 2020-12-25 ENCOUNTER — Telehealth: Payer: Self-pay | Admitting: Internal Medicine

## 2020-12-25 NOTE — Telephone Encounter (Signed)
Patient c/o Palpitations:  High priority if patient c/o lightheadedness, shortness of breath, or chest pain  How long have you had palpitations/irregular HR/ Afib? Are you having the symptoms now? OFF AND ON   Are you currently experiencing lightheadedness, SOB or CP? NO  Do you have a history of afib (atrial fibrillation) or irregular heart rhythm? YES  Have you checked your BP or HR? (document readings if available): NO  Are you experiencing any other symptoms? Received call from [facility] stating that patient HR is all over the place.   [place]  [phone]

## 2020-12-25 NOTE — Telephone Encounter (Signed)
See my other note  Pt needs to get seen

## 2020-12-25 NOTE — Telephone Encounter (Signed)
Spoke with Jodie at [facility] who state that pt had been dizzy with activity. She also states that pt's blood pressure and heartrate has been " All over the place". Jodie will fax a copy of pt's blood pressure log to our office. Daughter [name] states that mother has had some fatigue. Debbie reports that pt was scheduled to see Dr. Harl Bowie who she saw in the hospital on tomorrow but appt was changed to next month. Pt placed on Midodrine 5 mg by nursing home doc because her blood pressure drops and HR elevates with activity. Please advise.

## 2020-12-26 ENCOUNTER — Ambulatory Visit: Payer: Medicare Other | Admitting: Cardiology

## 2020-12-26 NOTE — Progress Notes (Signed)
Cardiology Office Note  Date: 12/28/2020   ID: Monique, Robles 1941/03/10, MRN [id number]  PCP:  Celene Squibb, MD  Cardiologist:  Dorris Carnes, MD Electrophysiologist:  None   Chief Complaint: Hospital follow-up  History of Present Illness: Monique Robles is a 80 y.o. female with a history of CAD, acute respiratory failure with hypoxia, GERD, esophageal dysphagia, obesity, insomnia, IDA, DOE.  Recent hospital admission with progressive shortness of breath and hypoxia.  Admission diagnosis acute respiratory failure with hypoxia.  She was diagnosed with likely bronchitis with intermittent fluid overload requiring Lasix.  There was a possibility she may have drug-related toxicity from nitrofurantoin.  She was seen by pulmonary.  Hospital stay was complicated by atrial fibrillation RVR.  She was started on Cardizem drip then propranolol.  Due to dysphagia she had barium swallow study which showed esophageal motility disorder with plans for endoscopy.  EGD showed benign esophageal stenosis requiring dilation.  Eliquis was resumed on 11/29/2020.   Telephone encounter 12/25/2020 with complaint of palpitations on and off.  Denied any lightheadedness.  Received call from rehabilitation center stating patient's heart rate was all over the place.  Nursing staff spoke with Jeral Fruit at [facility] who stated patient had been dizzy with activity.  She was placed on midodrine 5 mg by nursing home doctor due to blood pressure dropping and heart rate elevating with activity.  Patient is here today due to issues [facility] rehab and health care with dizziness, fluctuating blood pressures and heart rates.  She is having some issues with hypotension and heart racing when doing more than usual activity.  She is unable to participate fully in her rehab process due to this issue.  She had been started on midodrine 2.5 mg p.o. twice daily then later midodrine 5 mg p.o. 3 times daily.  She was on Cardizem 30  mg p.o. 3 times daily.  Today she presents with continued symptoms.  EKG on arrival shows atrial fibrillation with RVR with a rate of 152.  I spoke to the daughter and patient and we decided given the fact that she will be discharged from the rehab facility tomorrow and will be sent home oral medications may not be the best option to control the heart rate.  We discussed transfer to [facility] for further evaluation and possible cardioversion.  We called EMS and spoke to the cardiologists at [facility] Drs. McDowell and Dr. Harrington Challenger.  Dr. Harrington Challenger had treated her previous to hospital admission with ischemic testing and echocardiogram as well as a cardiac monitor.  She had been previously admitted for acute respiratory failure and was found to be in atrial fibrillation with RVR at prior hospitalization.  She was treated with IV Cardizem.  We determined the best course of action would be for the patient to be sent to [facility] emergency room for treatment and possible cardioversion.  I spoke to Meyer Russel RN charge nurse at ED [facility] and gave report.  Patient is in route to the hospital at this time.     Past Medical History:  Diagnosis Date   Aortic insufficiency    Atrial fibrillation (HCC)    Diastolic dysfunction    DOE (dyspnea on exertion)    GERD (gastroesophageal reflux disease)    History of pneumonia    History of pulmonary embolism    History of stroke    HTN (hypertension)    Hyperlipemia    Mitral valve regurgitation  Osteoporosis    Tremor    Vitamin D deficiency     Past Surgical History:  Procedure Laterality Date   BALLOON DILATION N/A 11/28/2020   Procedure: BALLOON DILATION;  Surgeon: Rogene Houston, MD;  Location: [facility];  Service: Endoscopy;  Laterality: N/A;   BLADDER SURGERY     x 3   CERVICAL SPINE SURGERY     CHOLECYSTECTOMY     COLONOSCOPY     COLONOSCOPY WITH ESOPHAGOGASTRODUODENOSCOPY (EGD)     ESOPHAGOGASTRODUODENOSCOPY (EGD) WITH PROPOFOL  N/A 11/28/2020   Procedure: ESOPHAGOGASTRODUODENOSCOPY (EGD) WITH PROPOFOL;  Surgeon: Rogene Houston, MD;  Location: [facility];  Service: Endoscopy;  Laterality: N/A;   LUMBAR SPINE SURGERY     REPLACEMENT TOTAL KNEE Left    RIGHT LUNG WEDGE REMOVAL     TONSILLECTOMY      Current Outpatient Medications  Medication Sig Dispense Refill   Acetaminophen (TYLENOL ARTHRITIS PAIN PO) Take 650 mg by mouth in the morning, at noon, and at bedtime.     apixaban (ELIQUIS) 5 MG TABS tablet Take 5 mg by mouth 2 (two) times daily.     calcium carbonate (TUMS - DOSED IN MG ELEMENTAL CALCIUM) 500 MG chewable tablet Chew 1 tablet by mouth daily as needed for indigestion or heartburn.     cholecalciferol (VITAMIN D3) 25 MCG (1000 UNIT) tablet Take 1,000 Units by mouth daily.     cyclobenzaprine (FLEXERIL) 5 MG tablet Take 5 mg by mouth daily as needed.     diphenhydrAMINE (BENADRYL) 25 MG tablet Take 25 mg by mouth daily as needed for allergies.     furosemide (LASIX) 20 MG tablet Take 20 mg by mouth daily.     gabapentin (NEURONTIN) 100 MG capsule Take 200 mg by mouth 3 (three) times daily.     HYDROcodone-acetaminophen (NORCO/VICODIN) 5-325 MG tablet Take 1 tablet by mouth 2 (two) times daily as needed for moderate pain. (Patient not taking: No sig reported) 15 tablet 0   lactobacillus (FLORANEX/LACTINEX) PACK Take 1 packet (1 g total) by mouth 3 (three) times daily with meals. (Patient not taking: No sig reported)     levalbuterol (XOPENEX) 1.25 MG/0.5ML nebulizer solution Take 1.25 mg by nebulization every 6 (six) hours as needed for wheezing or shortness of breath. (Patient not taking: No sig reported) 1 each 12   Melatonin 10 MG TABS Take 10 mg by mouth at bedtime.     midodrine (PROAMATINE) 5 MG tablet Take 5 mg by mouth 3 (three) times daily with meals. (Patient not taking: No sig reported)     Multiple Vitamin (MULTIVITAMIN) tablet Take 1 tablet by mouth daily.     omeprazole (PRILOSEC) 40 MG  capsule Take 1 capsule by mouth daily.     propranolol (INDERAL) 40 MG tablet Take 1 tablet (40 mg total) by mouth 2 (two) times daily. 180 tablet 4   senna-docusate (SENOKOT-S) 8.6-50 MG tablet Take 2 tablets by mouth at bedtime as needed for moderate constipation.     sucralfate (CARAFATE) 1 g tablet Take 1 g by mouth at bedtime.     Vibegron (GEMTESA) 75 MG TABS Take 1 capsule by mouth daily. 30 tablet 11   VITAMIN D PO Take 1,000 Units by mouth daily. (Patient not taking: No sig reported)     Lactobacillus TABS Take 1 tablet by mouth with breakfast, with lunch, and with evening meal.     No current facility-administered medications for this visit.   Facility-Administered  Medications Ordered in Other Visits  Medication Dose Route Frequency Provider Last Rate Last Admin   diltiazem (CARDIZEM) 125 mg in dextrose 5% 125 mL (1 mg/mL) infusion  5-15 mg/hr Intravenous Continuous Noemi Chapel, MD   Stopped at 12/27/20 2014   Allergies:  Coumadin [warfarin], Atorvastatin, Ciprofloxacin, Macrobid [nitrofurantoin], and Sulfa antibiotics   Social History: The patient  reports that she has never smoked. She has never used smokeless tobacco. She reports that she does not currently use alcohol. She reports that she does not use drugs.   Family History: The patient's family history includes Alzheimer's disease in her mother; Cancer in her mother; Diabetes in her father; Heart attack in her father; Heart disease in her father; Pneumonia in her father; Tremor in her mother.   ROS:  Please see the history of present illness. Otherwise, complete review of systems is positive for none.  All other systems are reviewed and negative.   Physical Exam: VS:  BP 110/80 (BP Location: Left Arm, Cuff Size: Normal)   Ht 5\' 1"  (1.549 m)   Wt 184 lb 3.2 oz (83.6 kg)   SpO2 99%   BMI 34.80 kg/m , BMI Body mass index is 34.8 kg/m.  Wt Readings from Last 3 Encounters:  12/27/20 184 lb 3.2 oz (83.6 kg)  12/27/20 184  lb 3.2 oz (83.6 kg)  11/28/20 186 lb 15.2 oz (84.8 kg)    General: Patient appears comfortable at rest. Neck: Supple, no elevated JVP or carotid bruits, no thyromegaly. Lungs: Clear to auscultation, nonlabored breathing at rest. Cardiac: Tachycardic irregularly irregular rate and rhythm, no S3 or significant systolic murmur, no pericardial rub. Abdomen: Soft, nontender, no hepatomegaly, bowel sounds present, no guarding or rebound. Extremities: No pitting edema, distal pulses 2+. Skin: Warm and dry. Musculoskeletal: No kyphosis. Neuropsychiatric: Alert and oriented x3, affect grossly appropriate.  ECG: 12/27/2020 EKG atrial fibrillation with RVR, rate of 152.  Recent Labwork: 11/21/2020: TSH 1.773 11/25/2020: B Natriuretic Peptide 341.0 12/05/2020: Pro B Natriuretic peptide (BNP) 277.0 12/27/2020: ALT 11; AST 29; BUN 12; Creatinine, Ser 0.74; Hemoglobin 10.1; Magnesium 2.2; Platelets 453; Potassium 3.9; Sodium 136     Component Value Date/Time   CHOL 229 (H) 08/31/2020 0948   TRIG 92 08/31/2020 0948   HDL 64 08/31/2020 0948   CHOLHDL 3.6 08/31/2020 0948   VLDL 18 08/31/2020 0948   LDLCALC 147 (H) 08/31/2020 0948    Other Studies Reviewed Today:     Nuclear stress test 10/11/2020 Narrative & Impression  No diagnostic ST segment changes indicate ischemia. No significant myocardial perfusion defect indicate scar or ischemia. This is a low risk study. Nuclear stress EF: 68%.        Echocardiogram 09/14/2020 1. Left ventricular ejection fraction, by estimation, is 65 to 70%. The left ventricle has normal function. The left ventricle has no regional wall motion abnormalities. Left ventricular diastolic parameters are consistent with Grade I diastolic dysfunction (impaired relaxation). 2. Right ventricular systolic function is normal. The right ventricular size is normal. There is normal pulmonary artery systolic pressure. 3. Left atrial size was mildly dilated. 4. The mitral  valve is normal in structure. Mild mitral valve regurgitation. 5. AV is thickened, calcified with very mildly restricted motion. Peak and mean gradients through the valve are 20 and 10 mm Hg respectively consistent with mild AS.Marland Kitchen The aortic valve is tricuspid. Aortic valve regurgitation is not visualized. 6. The inferior vena cava is normal in size with greater than 50% respiratory variability, suggesting right  atrial pressure of 3 mmHg.   Cardiac monitor 09/11/2020 Patch Wear Time:  2 days and 3 hours (2022-06-24T09:18:08-398 to 2022-06-26T12:37:34-0400)   Sinus rhythm   Rates 49 to 179 bpm   Average HR 67 bpm   10 short bursts of SVT   Fastest 179 bpm for 4 beats  Longest 10 seconds    Rare PVCs   No pauses No symptoms/triggered events    Assessment and Plan:  1. Atrial fibrillation with RVR ([place])   2. Acute respiratory failure with hypoxia (HCC)   3. Dizziness   4. Hypotension, unspecified hypotension type    1. Atrial fibrillation with RVR (HCC) EKG today on arrival atrial fibrillation with RVR 152.Marland Kitchen  Patient has been in [facility] rehab facility the last 25 days since discharge from recent hospital stay.  She has been experiencing on and off episodes of tachycardia, shortness of breath, dizziness, fluctuating blood pressures, activity intolerance, shortness of breath.  Decision was made to transfer patient via EMS to [facility] ED.  To my knowledge she has been on Eliquis 5 mg p.o. twice daily for least 25 days during the time she has been at [facility] rehab.  At rehab facility she was taking Cardizem 30 mg p.o. 3 times daily  Addendum.  12/28/2020 she was seen in [facility] emergency room yesterday and eventually cardioverted back to normal sinus rhythm with a single 100 J shock..  Provider there mentioned she would need an extended stay in rehab due to her debilitated state   2. Acute respiratory failure with hypoxia [facility]) Recent hospital station for acute respiratory failure.  Currently at  [facility] rehab with plans to be discharged tomorrow.  She is having issues with fluctuating blood pressures, dizziness, shortness of breath, activity intolerance.  Heart rate on arrival today is 152 she is an atrial fibrillation with RVR on exam.  3. Dizziness Issues with dizziness intermittently so it [facility] rehab.  It appears she has been having paroxysmal symptoms of rapid atrial fibrillation with RVR.  She was prescribed Cardizem 30 mg p.o. 3 times daily which apparently was not effective at controlling her heart rate.  4. Hypotension, unspecified hypotension type Recently having issues with hypotension at [facility] rehab and health care.  The doctor there had placed her on midodrine 2.5 mg p.o. twice daily and subsequently increased it to 5 mg p.o. 3 times daily.  Given the fact that her heart rate is so fast today and atrial fibrillation with RVR hypotension was likely due to paroxysmal of rapid atrial fibrillation during her stay.  Blood pressure today is 110/80.  Medication Adjustments/Labs and Tests Ordered: Current medicines are reviewed at length with the patient today.  Concerns regarding medicines are outlined above.   Disposition: Follow-up with Dr. Harrington Challenger or APP  Signed, Levell July, NP 12/28/2020 8:01 AM    [facility], [place], [place] [address] Phone: [phone]; Fax: [phone]

## 2020-12-26 NOTE — Telephone Encounter (Signed)
Pt has appt with Lawanda Cousins, NP on 12/27/20.

## 2020-12-27 ENCOUNTER — Emergency Department (HOSPITAL_COMMUNITY)
Admission: EM | Admit: 2020-12-27 | Discharge: 2020-12-28 | Disposition: A | Discharge status: Home / Self Care | Payer: Medicare Other | Attending: Emergency Medicine | Admitting: Emergency Medicine

## 2020-12-27 ENCOUNTER — Encounter: Payer: Self-pay | Admitting: Family Medicine

## 2020-12-27 ENCOUNTER — Emergency Department (HOSPITAL_COMMUNITY): Payer: Medicare Other

## 2020-12-27 ENCOUNTER — Ambulatory Visit: Payer: Medicare Other | Admitting: Family Medicine

## 2020-12-27 ENCOUNTER — Encounter (HOSPITAL_COMMUNITY): Payer: Self-pay | Admitting: *Deleted

## 2020-12-27 ENCOUNTER — Other Ambulatory Visit: Payer: Self-pay

## 2020-12-27 VITALS — BP 110/80 | Ht 61.0 in | Wt 184.2 lb

## 2020-12-27 DIAGNOSIS — Z888 Allergy status to other drugs, medicaments and biological substances status: Secondary | ICD-10-CM

## 2020-12-27 DIAGNOSIS — D500 Iron deficiency anemia secondary to blood loss (chronic): Secondary | ICD-10-CM | POA: Diagnosis present

## 2020-12-27 DIAGNOSIS — Z87440 Personal history of urinary (tract) infections: Secondary | ICD-10-CM

## 2020-12-27 DIAGNOSIS — R000 Tachycardia, unspecified: Secondary | ICD-10-CM | POA: Insufficient documentation

## 2020-12-27 DIAGNOSIS — Z809 Family history of malignant neoplasm, unspecified: Secondary | ICD-10-CM

## 2020-12-27 DIAGNOSIS — E559 Vitamin D deficiency, unspecified: Secondary | ICD-10-CM | POA: Diagnosis present

## 2020-12-27 DIAGNOSIS — Z833 Family history of diabetes mellitus: Secondary | ICD-10-CM

## 2020-12-27 DIAGNOSIS — I110 Hypertensive heart disease with heart failure: Principal | ICD-10-CM | POA: Diagnosis present

## 2020-12-27 DIAGNOSIS — I951 Orthostatic hypotension: Secondary | ICD-10-CM | POA: Diagnosis present

## 2020-12-27 DIAGNOSIS — Z8673 Personal history of transient ischemic attack (TIA), and cerebral infarction without residual deficits: Secondary | ICD-10-CM

## 2020-12-27 DIAGNOSIS — J8410 Pulmonary fibrosis, unspecified: Secondary | ICD-10-CM | POA: Diagnosis present

## 2020-12-27 DIAGNOSIS — Z7901 Long term (current) use of anticoagulants: Secondary | ICD-10-CM | POA: Insufficient documentation

## 2020-12-27 DIAGNOSIS — Z96652 Presence of left artificial knee joint: Secondary | ICD-10-CM | POA: Insufficient documentation

## 2020-12-27 DIAGNOSIS — M810 Age-related osteoporosis without current pathological fracture: Secondary | ICD-10-CM | POA: Diagnosis present

## 2020-12-27 DIAGNOSIS — I080 Rheumatic disorders of both mitral and aortic valves: Secondary | ICD-10-CM | POA: Diagnosis present

## 2020-12-27 DIAGNOSIS — I5033 Acute on chronic diastolic (congestive) heart failure: Secondary | ICD-10-CM | POA: Diagnosis present

## 2020-12-27 DIAGNOSIS — Z743 Need for continuous supervision: Secondary | ICD-10-CM | POA: Diagnosis not present

## 2020-12-27 DIAGNOSIS — I499 Cardiac arrhythmia, unspecified: Secondary | ICD-10-CM | POA: Diagnosis not present

## 2020-12-27 DIAGNOSIS — G250 Essential tremor: Secondary | ICD-10-CM | POA: Diagnosis present

## 2020-12-27 DIAGNOSIS — I2510 Atherosclerotic heart disease of native coronary artery without angina pectoris: Secondary | ICD-10-CM | POA: Diagnosis present

## 2020-12-27 DIAGNOSIS — Z8249 Family history of ischemic heart disease and other diseases of the circulatory system: Secondary | ICD-10-CM

## 2020-12-27 DIAGNOSIS — G4700 Insomnia, unspecified: Secondary | ICD-10-CM | POA: Diagnosis present

## 2020-12-27 DIAGNOSIS — K219 Gastro-esophageal reflux disease without esophagitis: Secondary | ICD-10-CM | POA: Diagnosis present

## 2020-12-27 DIAGNOSIS — Z9981 Dependence on supplemental oxygen: Secondary | ICD-10-CM

## 2020-12-27 DIAGNOSIS — Z79899 Other long term (current) drug therapy: Secondary | ICD-10-CM | POA: Insufficient documentation

## 2020-12-27 DIAGNOSIS — Z20822 Contact with and (suspected) exposure to covid-19: Secondary | ICD-10-CM | POA: Insufficient documentation

## 2020-12-27 DIAGNOSIS — I959 Hypotension, unspecified: Secondary | ICD-10-CM

## 2020-12-27 DIAGNOSIS — Z881 Allergy status to other antibiotic agents status: Secondary | ICD-10-CM

## 2020-12-27 DIAGNOSIS — I5030 Unspecified diastolic (congestive) heart failure: Secondary | ICD-10-CM | POA: Insufficient documentation

## 2020-12-27 DIAGNOSIS — Z86711 Personal history of pulmonary embolism: Secondary | ICD-10-CM

## 2020-12-27 DIAGNOSIS — J9621 Acute and chronic respiratory failure with hypoxia: Secondary | ICD-10-CM | POA: Diagnosis present

## 2020-12-27 DIAGNOSIS — N179 Acute kidney failure, unspecified: Secondary | ICD-10-CM | POA: Diagnosis not present

## 2020-12-27 DIAGNOSIS — R0902 Hypoxemia: Secondary | ICD-10-CM | POA: Diagnosis not present

## 2020-12-27 DIAGNOSIS — J9601 Acute respiratory failure with hypoxia: Secondary | ICD-10-CM | POA: Diagnosis not present

## 2020-12-27 DIAGNOSIS — R42 Dizziness and giddiness: Secondary | ICD-10-CM | POA: Diagnosis not present

## 2020-12-27 DIAGNOSIS — I4891 Unspecified atrial fibrillation: Secondary | ICD-10-CM | POA: Insufficient documentation

## 2020-12-27 DIAGNOSIS — Z8701 Personal history of pneumonia (recurrent): Secondary | ICD-10-CM

## 2020-12-27 DIAGNOSIS — R0689 Other abnormalities of breathing: Secondary | ICD-10-CM | POA: Diagnosis not present

## 2020-12-27 DIAGNOSIS — R1314 Dysphagia, pharyngoesophageal phase: Secondary | ICD-10-CM | POA: Diagnosis present

## 2020-12-27 DIAGNOSIS — R2243 Localized swelling, mass and lump, lower limb, bilateral: Secondary | ICD-10-CM | POA: Insufficient documentation

## 2020-12-27 DIAGNOSIS — T502X5A Adverse effect of carbonic-anhydrase inhibitors, benzothiadiazides and other diuretics, initial encounter: Secondary | ICD-10-CM | POA: Diagnosis not present

## 2020-12-27 DIAGNOSIS — E876 Hypokalemia: Secondary | ICD-10-CM | POA: Diagnosis not present

## 2020-12-27 DIAGNOSIS — Z66 Do not resuscitate: Secondary | ICD-10-CM | POA: Diagnosis not present

## 2020-12-27 DIAGNOSIS — R079 Chest pain, unspecified: Secondary | ICD-10-CM | POA: Diagnosis not present

## 2020-12-27 DIAGNOSIS — I4819 Other persistent atrial fibrillation: Secondary | ICD-10-CM | POA: Diagnosis present

## 2020-12-27 DIAGNOSIS — T378X5A Adverse effect of other specified systemic anti-infectives and antiparasitics, initial encounter: Secondary | ICD-10-CM | POA: Diagnosis present

## 2020-12-27 DIAGNOSIS — N3281 Overactive bladder: Secondary | ICD-10-CM | POA: Diagnosis present

## 2020-12-27 DIAGNOSIS — R0602 Shortness of breath: Secondary | ICD-10-CM | POA: Diagnosis present

## 2020-12-27 DIAGNOSIS — R6889 Other general symptoms and signs: Secondary | ICD-10-CM | POA: Diagnosis not present

## 2020-12-27 DIAGNOSIS — Z882 Allergy status to sulfonamides status: Secondary | ICD-10-CM

## 2020-12-27 DIAGNOSIS — E785 Hyperlipidemia, unspecified: Secondary | ICD-10-CM | POA: Diagnosis present

## 2020-12-27 DIAGNOSIS — Z820 Family history of epilepsy and other diseases of the nervous system: Secondary | ICD-10-CM

## 2020-12-27 LAB — RESP PANEL BY RT-PCR (FLU A&B, COVID) ARPGX2
Influenza A by PCR: NEGATIVE
Influenza B by PCR: NEGATIVE
SARS Coronavirus 2 by RT PCR: NEGATIVE

## 2020-12-27 LAB — COMPREHENSIVE METABOLIC PANEL
ALT: 11 U/L (ref 0–44)
AST: 29 U/L (ref 15–41)
Albumin: 3.2 g/dL — ABNORMAL LOW (ref 3.5–5.0)
Alkaline Phosphatase: 69 U/L (ref 38–126)
Anion gap: 10 (ref 5–15)
BUN: 12 mg/dL (ref 8–23)
CO2: 22 mmol/L (ref 22–32)
Calcium: 7.9 mg/dL — ABNORMAL LOW (ref 8.9–10.3)
Chloride: 104 mmol/L (ref 98–111)
Creatinine, Ser: 0.74 mg/dL (ref 0.44–1.00)
GFR, Estimated: >60 mL/min (ref >60)
Glucose, Bld: 108 mg/dL — ABNORMAL HIGH (ref 70–99)
Potassium: 3.9 mmol/L (ref 3.5–5.1)
Sodium: 136 mmol/L (ref 135–145)
Total Bilirubin: 0.3 mg/dL (ref 0.3–1.2)
Total Protein: 6.3 g/dL — ABNORMAL LOW (ref 6.5–8.1)

## 2020-12-27 LAB — CBC WITH DIFFERENTIAL/PLATELET
Abs Immature Granulocytes: 0.04 10*3/uL (ref 0.00–0.07)
Basophils Absolute: 0.1 10*3/uL (ref 0.0–0.1)
Basophils Relative: 1 %
Eosinophils Absolute: 0.1 10*3/uL (ref 0.0–0.5)
Eosinophils Relative: 2 %
HCT: 34.9 % — ABNORMAL LOW (ref 36.0–46.0)
Hemoglobin: 10.1 g/dL — ABNORMAL LOW (ref 12.0–15.0)
Immature Granulocytes: 1 %
Lymphocytes Relative: 37 %
Lymphs Abs: 2.5 10*3/uL (ref 0.7–4.0)
MCH: 26.2 pg (ref 26.0–34.0)
MCHC: 28.9 g/dL — ABNORMAL LOW (ref 30.0–36.0)
MCV: 90.6 fL (ref 80.0–100.0)
Monocytes Absolute: 0.6 10*3/uL (ref 0.1–1.0)
Monocytes Relative: 9 %
Neutro Abs: 3.4 10*3/uL (ref 1.7–7.7)
Neutrophils Relative %: 50 %
Platelets: 453 10*3/uL — ABNORMAL HIGH (ref 150–400)
RBC: 3.85 MIL/uL — ABNORMAL LOW (ref 3.87–5.11)
RDW: 16.9 % — ABNORMAL HIGH (ref 11.5–15.5)
WBC: 6.7 10*3/uL (ref 4.0–10.5)
nRBC: 0 % (ref 0.0–0.2)

## 2020-12-27 LAB — PROTIME-INR
INR: 1.5 — ABNORMAL HIGH (ref 0.8–1.2)
Prothrombin Time: 17.9 seconds — ABNORMAL HIGH (ref 11.4–15.2)

## 2020-12-27 LAB — MAGNESIUM: Magnesium: 2.2 mg/dL (ref 1.7–2.4)

## 2020-12-27 IMAGING — DX DG CHEST 1V PORT
1 series · 1 of 1 positions shown · non-contrast
Comparison: [DATE].

CLINICAL DATA: Hypoxia.

EXAM:
PORTABLE CHEST 1 VIEW

[chest ap]
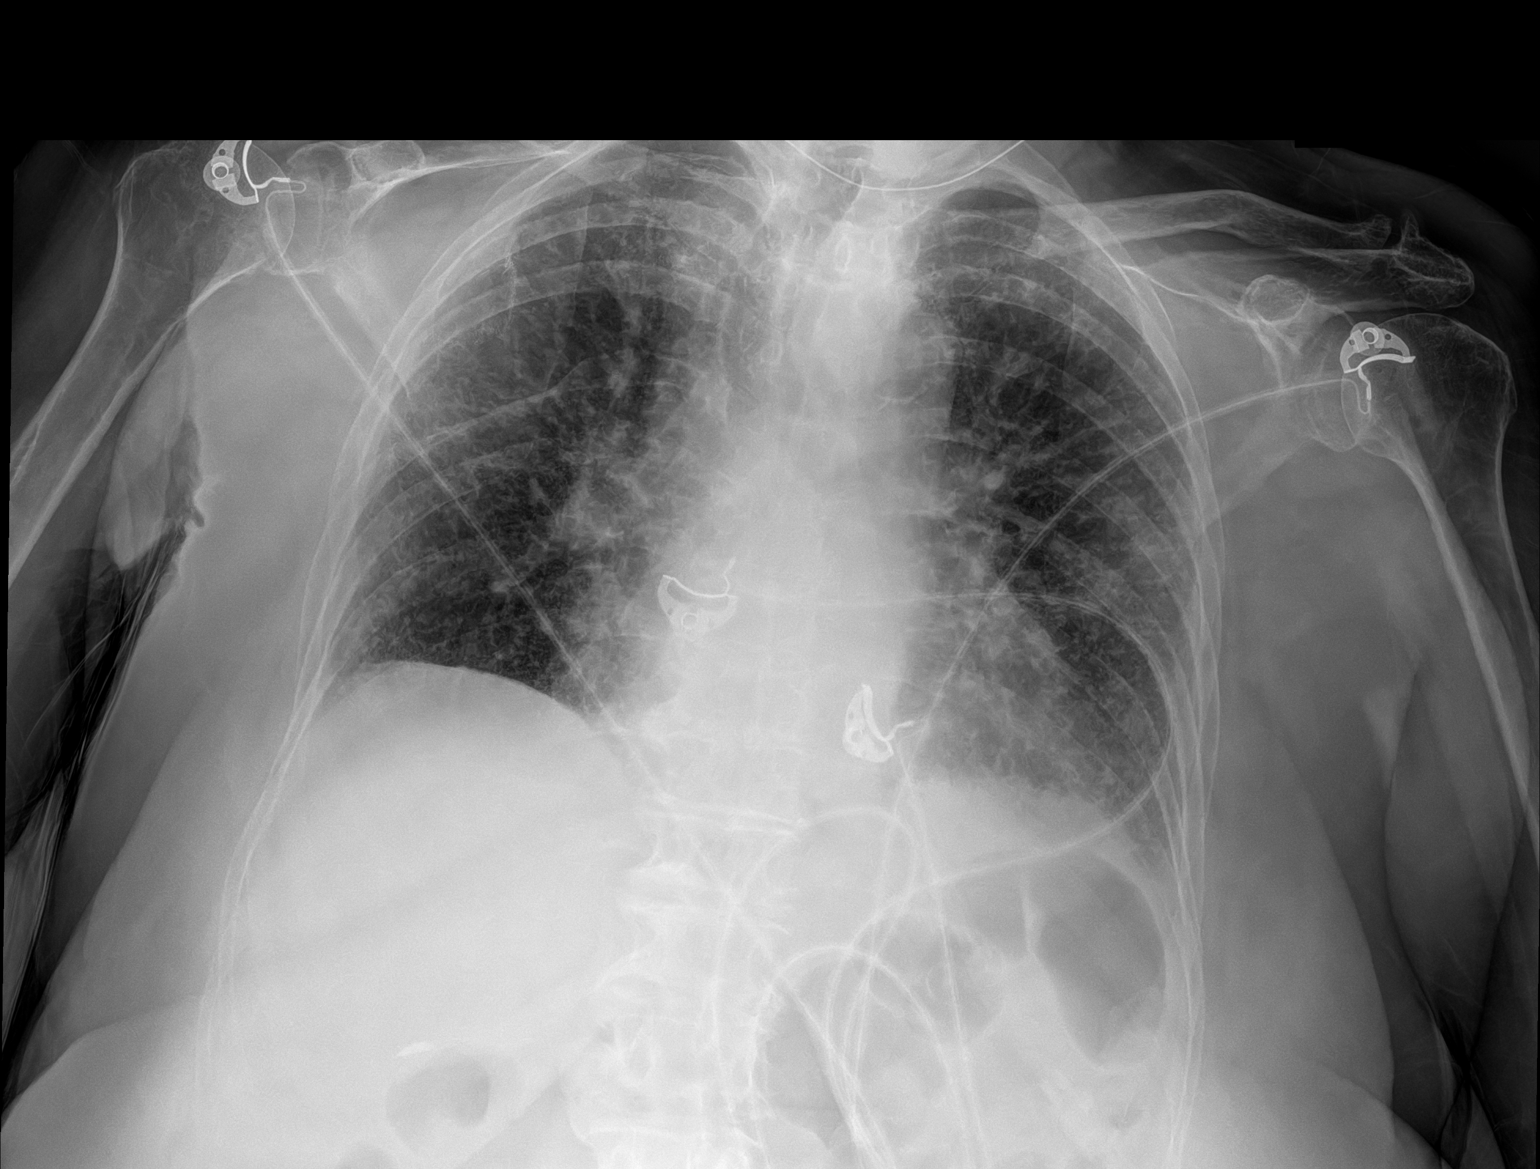

[1 of 1 positions shown; findings below may reference images not displayed]

FINDINGS: Stable cardiomediastinal silhouette. Stable bilateral lung opacities
are noted most consistent with chronic interstitial lung disease. No
definite acute abnormality is noted. Bony thorax is unremarkable.
IMPRESSION: Stable chronic findings as described above. No definite acute
abnormality is noted.

## 2020-12-27 MED ORDER — PROPOFOL 10 MG/ML IV BOLUS
1.0000 mg/kg | Freq: Once | INTRAVENOUS | Status: AC
  Administered 2020-12-27: 83.6 mg via INTRAVENOUS
  Filled 2020-12-27: qty 20

## 2020-12-27 MED ORDER — DILTIAZEM HCL-DEXTROSE 125-5 MG/125ML-% IV SOLN (PREMIX)
5.0000 mg/h | INTRAVENOUS | Status: DC
Start: 2020-12-27 — End: 2020-12-28
  Administered 2020-12-27: 5 mg/h via INTRAVENOUS
  Filled 2020-12-27: qty 125

## 2020-12-27 MED ORDER — FENTANYL CITRATE PF 50 MCG/ML IJ SOSY
50.0000 ug | PREFILLED_SYRINGE | Freq: Once | INTRAMUSCULAR | Status: AC
  Administered 2020-12-27: 50 ug via INTRAVENOUS
  Filled 2020-12-27: qty 1

## 2020-12-27 NOTE — ED Provider Notes (Signed)
[facility] EMERGENCY DEPARTMENT Provider Note   CSN: [id number] Arrival date & time: 12/27/20  1727     History Chief Complaint  Patient presents with   Shortness of Breath    Monique Robles is a 80 y.o. female.   Shortness of Breath  This patient is a 80 year old female who has a known history of atrial fibrillation, aortic insufficiency, history of pulmonary embolism and a prior history of a stroke, she is currently taking Eliquis and she cannot tell me how long she has been on this but suspects has been for several years.  She was admitted to the hospital with hypoxia on September 13 and discharged on the 22nd where she went to a skilled nursing facility.  Ultimately the patient was diagnosed with bronchitis with a fluid overloaded state, they thought this may have been drug related secondary to nitrofurantoin, she had been seen by pulmonary, her admission was complicated by atrial fibrillation with RVR and she was discharged on propanolol.  She had Eliquis that was resumed on September 22, she has been on the Eliquis for less than 30 days.  The patient tells me that she was at the office today, she was being seen by the nurse practitioner, Mr. Leonides Sake, he comments that the patient had been started on midodrine recently because of low blood pressure  Echocardiogram performed July 2022 showed grade 1 diastolic dysfunction but preserved ejection fraction  Unfortunately at the time the patient comes to the emergency department the office is closed and the note is incomplete from the nurse practitioner.  It is not clear whether this patient has chronic atrial fibrillation or paroxysmal though I suspect it is paroxysmal.  She has not been on her Eliquis for more than 30 days.  The patient has a heart rate of about 130 as documented by paramedics, they gave 20 mg of Cardizem prehospital, this caused hypotension and made very little improvement in the heart rate.  The patient denies fevers  chills nausea vomiting but has had some swelling and feels like her heart is been palpitating for quite some time, she is a poor historian, level 5 caveat applies due to some confusion regarding history  Past Medical History:  Diagnosis Date   Aortic insufficiency    Atrial fibrillation (HCC)    Diastolic dysfunction    DOE (dyspnea on exertion)    GERD (gastroesophageal reflux disease)    History of pneumonia    History of pulmonary embolism    History of stroke    HTN (hypertension)    Hyperlipemia    Mitral valve regurgitation    Osteoporosis    Tremor    Vitamin D deficiency     Patient Active Problem List   Diagnosis Date Noted   Chronic respiratory failure with hypoxia ([place]) 12/06/2020   DOE (dyspnea on exertion) 12/05/2020   Esophageal dysphagia    Chronic lung disease    Drug-induced pneumonitis    Iron deficiency anemia due to chronic blood loss 11/23/2020    Class: Chronic   Acute respiratory failure with hypoxia ([place]) 11/21/2020   GERD (gastroesophageal reflux disease) 11/21/2020   Bronchitis 11/21/2020   Obesity (BMI 30-39.9) 11/21/2020   Insomnia 11/21/2020   Acute respiratory failure ([place]) 11/21/2020   Overactive bladder 08/24/2020   Acute cystitis without hematuria 08/24/2020   Chronic cystitis with hematuria 08/24/2020   Essential tremor 08/15/2019   Cerebrovascular accident (CVA) ([place]) 08/15/2019   Gait abnormality 08/15/2019    Past Surgical History:  Procedure Laterality Date   BALLOON DILATION N/A 11/28/2020   Procedure: BALLOON DILATION;  Surgeon: Rogene Houston, MD;  Location: [facility] ENDO SUITE;  Service: Endoscopy;  Laterality: N/A;   BLADDER SURGERY     x 3   CERVICAL SPINE SURGERY     CHOLECYSTECTOMY     COLONOSCOPY     COLONOSCOPY WITH ESOPHAGOGASTRODUODENOSCOPY (EGD)     ESOPHAGOGASTRODUODENOSCOPY (EGD) WITH PROPOFOL N/A 11/28/2020   Procedure: ESOPHAGOGASTRODUODENOSCOPY (EGD) WITH PROPOFOL;  Surgeon: Rogene Houston, MD;  Location: [facility]  ENDO SUITE;  Service: Endoscopy;  Laterality: N/A;   LUMBAR SPINE SURGERY     REPLACEMENT TOTAL KNEE Left    RIGHT LUNG WEDGE REMOVAL     TONSILLECTOMY       OB History   No obstetric history on file.     Family History  Problem Relation Age of Onset   Alzheimer's disease Mother        died at 81   Cancer Mother    Tremor Mother    Pneumonia Father    Heart disease Father    Diabetes Father    Heart attack Father        died at 40    Social History   Tobacco Use   Smoking status: Never   Smokeless tobacco: Never  Vaping Use   Vaping Use: Never used  Substance Use Topics   Alcohol use: Not Currently   Drug use: Never    Home Medications Prior to Admission medications   Medication Sig Start Date End Date Taking? Authorizing Provider  Acetaminophen (TYLENOL ARTHRITIS PAIN PO) Take 650 mg by mouth in the morning, at noon, and at bedtime.   Yes [provider]  apixaban (ELIQUIS) 5 MG TABS tablet Take 5 mg by mouth 2 (two) times daily.   Yes [provider]  calcium carbonate (TUMS - DOSED IN MG ELEMENTAL CALCIUM) 500 MG chewable tablet Chew 1 tablet by mouth daily as needed for indigestion or heartburn.   Yes [provider]  cholecalciferol (VITAMIN D3) 25 MCG (1000 UNIT) tablet Take 1,000 Units by mouth daily.   Yes [provider]  cyclobenzaprine (FLEXERIL) 5 MG tablet Take 5 mg by mouth daily as needed. 07/31/20  Yes [provider]  diphenhydrAMINE (BENADRYL) 25 MG tablet Take 25 mg by mouth daily as needed for allergies.   Yes [provider]  furosemide (LASIX) 20 MG tablet Take 20 mg by mouth daily. 07/31/20  Yes [provider]  gabapentin (NEURONTIN) 100 MG capsule Take 200 mg by mouth 3 (three) times daily.   Yes [provider]  Lactobacillus TABS Take 1 tablet by mouth with breakfast, with lunch, and with evening meal.   Yes [provider]  Melatonin 10 MG TABS Take 10 mg by  mouth at bedtime.   Yes [provider]  Multiple Vitamin (MULTIVITAMIN) tablet Take 1 tablet by mouth daily.   Yes [provider]  omeprazole (PRILOSEC) 40 MG capsule Take 1 capsule by mouth daily. 06/21/20  Yes [provider]  propranolol (INDERAL) 40 MG tablet Take 1 tablet (40 mg total) by mouth 2 (two) times daily. 08/15/19  Yes Marcial Pacas, MD  senna-docusate (SENOKOT-S) 8.6-50 MG tablet Take 2 tablets by mouth at bedtime as needed for moderate constipation. 11/29/20  Yes Amin, Ankit Chirag, MD  sucralfate (CARAFATE) 1 g tablet Take 1 g by mouth at bedtime.   Yes [provider]  Vibegron (GEMTESA) 75 MG  TABS Take 1 capsule by mouth daily. 09/20/20  Yes McKenzie, Candee Furbish, MD  HYDROcodone-acetaminophen (NORCO/VICODIN) 5-325 MG tablet Take 1 tablet by mouth 2 (two) times daily as needed for moderate pain. Patient not taking: No sig reported 11/29/20   Damita Lack, MD  lactobacillus (FLORANEX/LACTINEX) PACK Take 1 packet (1 g total) by mouth 3 (three) times daily with meals. Patient not taking: No sig reported 11/29/20   Damita Lack, MD  levalbuterol (XOPENEX) 1.25 MG/0.5ML nebulizer solution Take 1.25 mg by nebulization every 6 (six) hours as needed for wheezing or shortness of breath. Patient not taking: No sig reported 11/29/20   Damita Lack, MD  midodrine (PROAMATINE) 5 MG tablet Take 5 mg by mouth 3 (three) times daily with meals. Patient not taking: No sig reported    [provider]  VITAMIN D PO Take 1,000 Units by mouth daily. Patient not taking: No sig reported    [provider]    Allergies    Coumadin [warfarin], Atorvastatin, Ciprofloxacin, Macrobid [nitrofurantoin], and Sulfa antibiotics  Review of Systems   Review of Systems  Unable to perform ROS: Other  Respiratory:  Positive for shortness of breath.    Physical Exam Updated Vital Signs BP 129/84   Pulse 81   Temp 97.8 F (36.6 C) (Oral)   Resp  10   Ht 1.549 m (5\' 1" )   Wt 83.6 kg   SpO2 100%   BMI 34.80 kg/m   Physical Exam Vitals and nursing note reviewed.  Constitutional:      General: She is not in acute distress.    Appearance: She is well-developed.  HENT:     Head: Normocephalic and atraumatic.     Mouth/Throat:     Pharynx: No oropharyngeal exudate.  Eyes:     General: No scleral icterus.       Right eye: No discharge.        Left eye: No discharge.     Conjunctiva/sclera: Conjunctivae normal.     Pupils: Pupils are equal, round, and reactive to light.  Neck:     Thyroid: No thyromegaly.     Vascular: No JVD.  Cardiovascular:     Rate and Rhythm: Tachycardia present. Rhythm irregular.     Heart sounds: Normal heart sounds. No murmur heard.   No friction rub. No gallop.  Pulmonary:     Effort: Pulmonary effort is normal. No respiratory distress.     Breath sounds: Normal breath sounds. No wheezing or rales.  Abdominal:     General: Bowel sounds are normal. There is no distension.     Palpations: Abdomen is soft. There is no mass.     Tenderness: There is no abdominal tenderness.  Musculoskeletal:        General: No tenderness. Normal range of motion.     Cervical back: Normal range of motion and neck supple.     Right lower leg: Edema present.     Left lower leg: Edema present.  Lymphadenopathy:     Cervical: No cervical adenopathy.  Skin:    General: Skin is warm and dry.     Findings: No erythema or rash.  Neurological:     Mental Status: She is alert.     Coordination: Coordination normal.  Psychiatric:        Behavior: Behavior normal.    ED Results / Procedures / Treatments   Labs (all labs ordered are listed, but only abnormal results are displayed) Labs  Reviewed  CBC WITH DIFFERENTIAL/PLATELET - Abnormal; Notable for the following components:      Result Value   RBC 3.85 (*)    Hemoglobin 10.1 (*)    HCT 34.9 (*)    MCHC 28.9 (*)    RDW 16.9 (*)    Platelets 453 (*)    All other  components within normal limits  COMPREHENSIVE METABOLIC PANEL - Abnormal; Notable for the following components:   Glucose, Bld 108 (*)    Calcium 7.9 (*)    Total Protein 6.3 (*)    Albumin 3.2 (*)    All other components within normal limits  PROTIME-INR - Abnormal; Notable for the following components:   Prothrombin Time 17.9 (*)    INR 1.5 (*)    All other components within normal limits  RESP PANEL BY RT-PCR (FLU A&B, COVID) ARPGX2  MAGNESIUM    EKG EKG Interpretation  Date/Time:  Thursday December 27 2020 17:46:37 EDT Ventricular Rate:  104 PR Interval:    QRS Duration: 110 QT Interval:  361 QTC Calculation: 432 R Axis:   53 Text Interpretation: Atrial fibrillation Minimal ST depression, lateral leads Artifact in lead(s) I II III aVR aVL aVF V1 V2 V3 V4 V5 V6 similar to 11/24/20 with afib but faster today Confirmed by Noemi Chapel [phone]) on 12/27/2020 5:56:54 PM  Radiology DG Chest Portable 1 View  Result Date: 12/27/2020 CLINICAL DATA:  Hypoxia. EXAM: PORTABLE CHEST 1 VIEW COMPARISON:  December 05, 2020. FINDINGS: Stable cardiomediastinal silhouette. Stable bilateral lung opacities are noted most consistent with chronic interstitial lung disease. No definite acute abnormality is noted. Bony thorax is unremarkable. IMPRESSION: Stable chronic findings as described above. No definite acute abnormality is noted. Electronically Signed   By: Marijo Conception M.D.   On: 12/27/2020 18:40    Procedures .Critical Care Performed by: Noemi Chapel, MD Authorized by: Noemi Chapel, MD   Critical care provider statement:    Critical care time (minutes):  45   Critical care time was exclusive of:  Separately billable procedures and treating other patients   Critical care was necessary to treat or prevent imminent or life-threatening deterioration of the following conditions:  Cardiac failure and circulatory failure   Critical care was time spent personally by me on the following  activities:  Development of treatment plan with patient or surrogate, discussions with consultants, evaluation of patient's response to treatment, examination of patient, obtaining history from patient or surrogate, review of old charts, re-evaluation of patient's condition, pulse oximetry, ordering and review of radiographic studies, ordering and review of laboratory studies and ordering and performing treatments and interventions   Care discussed with: admitting provider   Comments:       .Cardioversion  Date/Time: 12/27/2020 8:40 PM Performed by: Noemi Chapel, MD Authorized by: Noemi Chapel, MD   Consent:    Consent obtained:  Written and verbal   Consent given by:  Patient   Risks discussed:  Cutaneous burn, death, induced arrhythmia and pain   Alternatives discussed:  Rate-control medication and alternative treatment Pre-procedure details:    Cardioversion basis:  Elective   Rhythm:  Atrial fibrillation   Electrode placement:  Anterior-posterior Patient sedated: Yes. Refer to sedation procedure documentation for details of sedation.  Attempt one:    Cardioversion mode:  Synchronous   Waveform:  Biphasic   Shock (Joules):  100   Shock outcome:  Conversion to normal sinus rhythm Post-procedure details:    Patient status:  Awake  Patient tolerance of procedure:  Tolerated well, no immediate complications Comments:        .Sedation  Date/Time: 12/27/2020 8:40 PM Performed by: Noemi Chapel, MD Authorized by: Noemi Chapel, MD   Consent:    Consent obtained:  Verbal and written   Consent given by:  Patient and guardian   Risks discussed:  Allergic reaction, dysrhythmia, inadequate sedation, nausea, vomiting, respiratory compromise necessitating ventilatory assistance and intubation, prolonged sedation necessitating reversal and prolonged hypoxia resulting in organ damage Universal protocol:    Immediately prior to procedure, a time out was called: yes     Patient  identity confirmed:  Arm band and verbally with patient Indications:    Procedure performed:  Cardioversion   Procedure necessitating sedation performed by:  Physician performing sedation Pre-sedation assessment:    Time since last food or drink:  4 hours   ASA classification: class 3 - patient with severe systemic disease     Mouth opening:  3 or more finger widths   Thyromental distance:  4 finger widths   Mallampati score:  II - soft palate, uvula, fauces visible   Neck mobility: normal     Pre-sedation assessments completed and reviewed: airway patency, cardiovascular function, hydration status, mental status, nausea/vomiting, pain level, respiratory function and temperature     Pre-sedation assessment completed:  12/27/2020 8:00 PM Immediate pre-procedure details:    Reassessment: Patient reassessed immediately prior to procedure     Reviewed: vital signs, relevant labs/tests and NPO status     Verified: bag valve mask available, emergency equipment available, intubation equipment available, IV patency confirmed, oxygen available and reversal medications available   Procedure details (see MAR for exact dosages):    Preoxygenation:  Nasal cannula   Sedation:  Propofol   Intended level of sedation: deep   Analgesia:  Fentanyl   Intra-procedure monitoring:  Blood pressure monitoring, continuous capnometry, continuous pulse oximetry, cardiac monitor, frequent LOC assessments and frequent vital sign checks   Intra-procedure events: none     Total Provider sedation time (minutes):  20 Post-procedure details:    Post-sedation assessment completed:  12/27/2020 8:42 PM   Attendance: Constant attendance by certified staff until patient recovered     Recovery: Patient returned to pre-procedure baseline     Post-sedation assessments completed and reviewed: airway patency, cardiovascular function, hydration status, mental status, nausea/vomiting, pain level, respiratory function and temperature      Patient is stable for discharge or admission: yes     Procedure completion:  Tolerated well, no immediate complications Comments:         Medications Ordered in ED Medications  diltiazem (CARDIZEM) 125 mg in dextrose 5% 125 mL (1 mg/mL) infusion (0 mg/hr Intravenous Stopped 12/27/20 2014)  fentaNYL (SUBLIMAZE) injection 50 mcg (50 mcg Intravenous Given 12/27/20 2018)  propofol (DIPRIVAN) 10 mg/mL bolus/IV push 83.6 mg (83.6 mg Intravenous Given 12/27/20 2022)    ED Course  I have reviewed the triage vital signs and the nursing notes.  Pertinent labs & imaging results that were available during my care of the patient were reviewed by me and considered in my medical decision making (see chart for details).    MDM Rules/Calculators/A&P                           Atrial fibrillation with rapid ventricular rate, the patient is going to need some rate control if not rhythm control but I am hesitant to cardiovert given that  she has not been anticoagulated long enough.  Will discuss with cardiology, Cardizem will be started, current blood pressure 814 systolic, normal mentation.  Labs pending, chest x-ray pending.  This patient is critically ill with an acute rapid arrhythmia  She has also been hypoxic and is noted to be 89%, she is on 2 L while she has been at rehab for the last month  I had a long discussion with the patient and her family member about the risk benefits and alternatives of proceeding with cardioversion, they ultimately agreed that this would be the right option  The patient was successfully cardioverted at 8:30 PM  Cardizem discontinued  The patient has maintained a normal sinus rhythm for the last 2 hours and is continuing to do well.  She has normotensive, she has no fever, her labs and evaluation was otherwise unremarkable.  Due to the patient's debilitated state I think that she needs a longer timeframe in rehab, she is not ready to go home to be by herself  independently and take care of her activities of daily living as she is severely debilitated.  I made this clear to the patient's family member as well as in the discharge summary to the nursing facility and rehab where she is currently staying  Plan per cardiology discussion is to discontinue diltiazem and continue with propanolol and follow-up in the cardiology office  Final Clinical Impression(s) / ED Diagnoses Final diagnoses:  Atrial fibrillation with rapid ventricular response ([place])     Noemi Chapel, MD 12/27/20 2247

## 2020-12-27 NOTE — Sedation Documentation (Signed)
Sync 100J delivered at 20:26 Return to NSR

## 2020-12-27 NOTE — ED Triage Notes (Signed)
Pt brought in by [facility] from [facility] in [place] with c/o SOB. EKG done at Cardiologist office and pt was found to be in A. Fib with RVR with HR ranging from 130-180bpm. EMS gave Cardizem 20mg  with HR decreasing to 87bpm. Initial SBP 130 with drop to 88 after Cardizem.

## 2020-12-27 NOTE — Discharge Instructions (Signed)
I have reviewed your care with the cardiologist this evening, we have decided that it is better for you to stop taking diltiazem and focus on propanolol to treat both your tremor and your fast heart rate.  We have been able to use an electrical shock to correct your heart rhythm which is now back to a normal sinus rhythm.  Because you are so incredibly weak and debilitated from your recent lung injury and admission to the hospital I think it is imperative that you stay in rehab longer, you are not yet ready to go home and be functional and independent.  You will need more physical therapy time and now that your heart has been corrected back to a normal rhythm hopefully this will increase your healing time.  Please return to the emergency department for severe worsening symptoms  Please follow-up with your cardiologist in 1 week

## 2020-12-27 NOTE — Patient Instructions (Addendum)
Medication Instructions:  Your physician recommends that you continue on your current medications as directed. Please refer to the Current Medication list given to you today.  Labwork: none  Testing/Procedures: none  Follow-Up: Your physician recommends that you schedule a follow-up appointment in: after ED visit  Any Other Special Instructions Will Be Listed Below (If Applicable).  If you need a refill on your cardiac medications before your next appointment, please call your pharmacy. 

## 2020-12-27 NOTE — Sedation Documentation (Signed)
ED Provider at bedside. 

## 2020-12-27 NOTE — Sedation Documentation (Signed)
Family updated as to patient's status. Will call when finished with procedure.

## 2020-12-28 ENCOUNTER — Encounter: Payer: Self-pay | Admitting: Family Medicine

## 2020-12-31 ENCOUNTER — Inpatient Hospital Stay (HOSPITAL_COMMUNITY)
Admission: EM | Admit: 2020-12-31 | Discharge: 2021-01-07 | DRG: 291 | Disposition: A | Discharge status: Skilled Nursing Facility | Payer: Medicare Other | Source: Skilled Nursing Facility | Attending: Internal Medicine | Admitting: Internal Medicine

## 2020-12-31 ENCOUNTER — Emergency Department (HOSPITAL_COMMUNITY): Payer: Medicare Other

## 2020-12-31 ENCOUNTER — Other Ambulatory Visit: Payer: Self-pay

## 2020-12-31 ENCOUNTER — Encounter (HOSPITAL_COMMUNITY): Payer: Self-pay | Admitting: Emergency Medicine

## 2020-12-31 DIAGNOSIS — R2689 Other abnormalities of gait and mobility: Secondary | ICD-10-CM | POA: Diagnosis not present

## 2020-12-31 DIAGNOSIS — Z8673 Personal history of transient ischemic attack (TIA), and cerebral infarction without residual deficits: Secondary | ICD-10-CM

## 2020-12-31 DIAGNOSIS — I5033 Acute on chronic diastolic (congestive) heart failure: Secondary | ICD-10-CM | POA: Diagnosis present

## 2020-12-31 DIAGNOSIS — J8410 Pulmonary fibrosis, unspecified: Secondary | ICD-10-CM | POA: Diagnosis present

## 2020-12-31 DIAGNOSIS — I509 Heart failure, unspecified: Secondary | ICD-10-CM | POA: Diagnosis not present

## 2020-12-31 DIAGNOSIS — I110 Hypertensive heart disease with heart failure: Secondary | ICD-10-CM | POA: Diagnosis not present

## 2020-12-31 DIAGNOSIS — Z86711 Personal history of pulmonary embolism: Secondary | ICD-10-CM

## 2020-12-31 DIAGNOSIS — I959 Hypotension, unspecified: Secondary | ICD-10-CM | POA: Diagnosis not present

## 2020-12-31 DIAGNOSIS — R6520 Severe sepsis without septic shock: Secondary | ICD-10-CM

## 2020-12-31 DIAGNOSIS — Z833 Family history of diabetes mellitus: Secondary | ICD-10-CM

## 2020-12-31 DIAGNOSIS — K219 Gastro-esophageal reflux disease without esophagitis: Secondary | ICD-10-CM | POA: Diagnosis not present

## 2020-12-31 DIAGNOSIS — Z881 Allergy status to other antibiotic agents status: Secondary | ICD-10-CM

## 2020-12-31 DIAGNOSIS — Z79899 Other long term (current) drug therapy: Secondary | ICD-10-CM | POA: Diagnosis not present

## 2020-12-31 DIAGNOSIS — Y95 Nosocomial condition: Secondary | ICD-10-CM

## 2020-12-31 DIAGNOSIS — G250 Essential tremor: Secondary | ICD-10-CM | POA: Diagnosis not present

## 2020-12-31 DIAGNOSIS — N3281 Overactive bladder: Secondary | ICD-10-CM | POA: Diagnosis present

## 2020-12-31 DIAGNOSIS — I499 Cardiac arrhythmia, unspecified: Secondary | ICD-10-CM | POA: Diagnosis not present

## 2020-12-31 DIAGNOSIS — T502X5A Adverse effect of carbonic-anhydrase inhibitors, benzothiadiazides and other diuretics, initial encounter: Secondary | ICD-10-CM | POA: Diagnosis not present

## 2020-12-31 DIAGNOSIS — I4891 Unspecified atrial fibrillation: Secondary | ICD-10-CM | POA: Diagnosis not present

## 2020-12-31 DIAGNOSIS — Z9981 Dependence on supplemental oxygen: Secondary | ICD-10-CM | POA: Diagnosis not present

## 2020-12-31 DIAGNOSIS — N3021 Other chronic cystitis with hematuria: Secondary | ICD-10-CM

## 2020-12-31 DIAGNOSIS — N179 Acute kidney failure, unspecified: Secondary | ICD-10-CM | POA: Diagnosis not present

## 2020-12-31 DIAGNOSIS — R1314 Dysphagia, pharyngoesophageal phase: Secondary | ICD-10-CM | POA: Diagnosis present

## 2020-12-31 DIAGNOSIS — Z7901 Long term (current) use of anticoagulants: Secondary | ICD-10-CM

## 2020-12-31 DIAGNOSIS — M6281 Muscle weakness (generalized): Secondary | ICD-10-CM | POA: Diagnosis not present

## 2020-12-31 DIAGNOSIS — T50905A Adverse effect of unspecified drugs, medicaments and biological substances, initial encounter: Secondary | ICD-10-CM | POA: Diagnosis not present

## 2020-12-31 DIAGNOSIS — A419 Sepsis, unspecified organism: Secondary | ICD-10-CM

## 2020-12-31 DIAGNOSIS — I4819 Other persistent atrial fibrillation: Secondary | ICD-10-CM | POA: Diagnosis present

## 2020-12-31 DIAGNOSIS — J9621 Acute and chronic respiratory failure with hypoxia: Secondary | ICD-10-CM | POA: Diagnosis not present

## 2020-12-31 DIAGNOSIS — Z820 Family history of epilepsy and other diseases of the nervous system: Secondary | ICD-10-CM | POA: Diagnosis not present

## 2020-12-31 DIAGNOSIS — Z809 Family history of malignant neoplasm, unspecified: Secondary | ICD-10-CM

## 2020-12-31 DIAGNOSIS — Z87440 Personal history of urinary (tract) infections: Secondary | ICD-10-CM

## 2020-12-31 DIAGNOSIS — Z8249 Family history of ischemic heart disease and other diseases of the circulatory system: Secondary | ICD-10-CM

## 2020-12-31 DIAGNOSIS — G4700 Insomnia, unspecified: Secondary | ICD-10-CM | POA: Diagnosis not present

## 2020-12-31 DIAGNOSIS — E876 Hypokalemia: Secondary | ICD-10-CM | POA: Diagnosis not present

## 2020-12-31 DIAGNOSIS — Z743 Need for continuous supervision: Secondary | ICD-10-CM | POA: Diagnosis not present

## 2020-12-31 DIAGNOSIS — Z66 Do not resuscitate: Secondary | ICD-10-CM | POA: Diagnosis not present

## 2020-12-31 DIAGNOSIS — R0602 Shortness of breath: Secondary | ICD-10-CM | POA: Diagnosis not present

## 2020-12-31 DIAGNOSIS — J9611 Chronic respiratory failure with hypoxia: Secondary | ICD-10-CM | POA: Diagnosis not present

## 2020-12-31 DIAGNOSIS — J984 Other disorders of lung: Secondary | ICD-10-CM

## 2020-12-31 DIAGNOSIS — Z20822 Contact with and (suspected) exposure to covid-19: Secondary | ICD-10-CM | POA: Diagnosis present

## 2020-12-31 DIAGNOSIS — Z882 Allergy status to sulfonamides status: Secondary | ICD-10-CM

## 2020-12-31 DIAGNOSIS — I517 Cardiomegaly: Secondary | ICD-10-CM | POA: Diagnosis not present

## 2020-12-31 DIAGNOSIS — I2510 Atherosclerotic heart disease of native coronary artery without angina pectoris: Secondary | ICD-10-CM | POA: Diagnosis present

## 2020-12-31 DIAGNOSIS — J189 Pneumonia, unspecified organism: Secondary | ICD-10-CM

## 2020-12-31 DIAGNOSIS — I5032 Chronic diastolic (congestive) heart failure: Secondary | ICD-10-CM | POA: Diagnosis not present

## 2020-12-31 DIAGNOSIS — Z96652 Presence of left artificial knee joint: Secondary | ICD-10-CM | POA: Diagnosis present

## 2020-12-31 DIAGNOSIS — I080 Rheumatic disorders of both mitral and aortic valves: Secondary | ICD-10-CM | POA: Diagnosis present

## 2020-12-31 DIAGNOSIS — J9601 Acute respiratory failure with hypoxia: Secondary | ICD-10-CM | POA: Diagnosis not present

## 2020-12-31 DIAGNOSIS — Z888 Allergy status to other drugs, medicaments and biological substances status: Secondary | ICD-10-CM

## 2020-12-31 DIAGNOSIS — R0902 Hypoxemia: Secondary | ICD-10-CM | POA: Diagnosis not present

## 2020-12-31 DIAGNOSIS — E785 Hyperlipidemia, unspecified: Secondary | ICD-10-CM | POA: Diagnosis not present

## 2020-12-31 DIAGNOSIS — T378X5A Adverse effect of other specified systemic anti-infectives and antiparasitics, initial encounter: Secondary | ICD-10-CM | POA: Diagnosis present

## 2020-12-31 DIAGNOSIS — I951 Orthostatic hypotension: Secondary | ICD-10-CM | POA: Diagnosis present

## 2020-12-31 DIAGNOSIS — R000 Tachycardia, unspecified: Secondary | ICD-10-CM | POA: Diagnosis not present

## 2020-12-31 DIAGNOSIS — I5030 Unspecified diastolic (congestive) heart failure: Secondary | ICD-10-CM | POA: Diagnosis not present

## 2020-12-31 DIAGNOSIS — I480 Paroxysmal atrial fibrillation: Secondary | ICD-10-CM | POA: Diagnosis not present

## 2020-12-31 DIAGNOSIS — T50901D Poisoning by unspecified drugs, medicaments and biological substances, accidental (unintentional), subsequent encounter: Secondary | ICD-10-CM | POA: Diagnosis not present

## 2020-12-31 DIAGNOSIS — R0600 Dyspnea, unspecified: Secondary | ICD-10-CM

## 2020-12-31 DIAGNOSIS — D500 Iron deficiency anemia secondary to blood loss (chronic): Secondary | ICD-10-CM | POA: Diagnosis not present

## 2020-12-31 DIAGNOSIS — J9811 Atelectasis: Secondary | ICD-10-CM | POA: Diagnosis not present

## 2020-12-31 DIAGNOSIS — M810 Age-related osteoporosis without current pathological fracture: Secondary | ICD-10-CM | POA: Diagnosis not present

## 2020-12-31 DIAGNOSIS — E559 Vitamin D deficiency, unspecified: Secondary | ICD-10-CM | POA: Diagnosis present

## 2020-12-31 DIAGNOSIS — R0609 Other forms of dyspnea: Secondary | ICD-10-CM | POA: Diagnosis not present

## 2020-12-31 DIAGNOSIS — Z8701 Personal history of pneumonia (recurrent): Secondary | ICD-10-CM

## 2020-12-31 DIAGNOSIS — R0689 Other abnormalities of breathing: Secondary | ICD-10-CM | POA: Diagnosis not present

## 2020-12-31 DIAGNOSIS — T378X5S Adverse effect of other specified systemic anti-infectives and antiparasitics, sequela: Secondary | ICD-10-CM | POA: Diagnosis not present

## 2020-12-31 DIAGNOSIS — J90 Pleural effusion, not elsewhere classified: Secondary | ICD-10-CM | POA: Diagnosis not present

## 2020-12-31 LAB — CBC WITH DIFFERENTIAL/PLATELET
Abs Immature Granulocytes: 0.07 10*3/uL (ref 0.00–0.07)
Basophils Absolute: 0.1 10*3/uL (ref 0.0–0.1)
Basophils Relative: 1 %
Eosinophils Absolute: 0.2 10*3/uL (ref 0.0–0.5)
Eosinophils Relative: 2 %
HCT: 36.9 % (ref 36.0–46.0)
Hemoglobin: 10.9 g/dL — ABNORMAL LOW (ref 12.0–15.0)
Immature Granulocytes: 1 %
Lymphocytes Relative: 27 %
Lymphs Abs: 3.3 10*3/uL (ref 0.7–4.0)
MCH: 26.3 pg (ref 26.0–34.0)
MCHC: 29.5 g/dL — ABNORMAL LOW (ref 30.0–36.0)
MCV: 89.1 fL (ref 80.0–100.0)
Monocytes Absolute: 1.5 10*3/uL — ABNORMAL HIGH (ref 0.1–1.0)
Monocytes Relative: 12 %
Neutro Abs: 7.1 10*3/uL (ref 1.7–7.7)
Neutrophils Relative %: 57 %
Platelets: 602 10*3/uL — ABNORMAL HIGH (ref 150–400)
RBC: 4.14 MIL/uL (ref 3.87–5.11)
RDW: 16.8 % — ABNORMAL HIGH (ref 11.5–15.5)
WBC: 12.3 10*3/uL — ABNORMAL HIGH (ref 4.0–10.5)
nRBC: 0.2 % (ref 0.0–0.2)

## 2020-12-31 LAB — LACTIC ACID, PLASMA
Lactic Acid, Venous: 1.6 mmol/L (ref 0.5–1.9)
Lactic Acid, Venous: 1.7 mmol/L (ref 0.5–1.9)

## 2020-12-31 LAB — URINALYSIS, ROUTINE W REFLEX MICROSCOPIC
Bilirubin Urine: NEGATIVE
Glucose, UA: NEGATIVE mg/dL
Ketones, ur: NEGATIVE mg/dL
Nitrite: POSITIVE — AB
Protein, ur: 30 mg/dL — AB
Specific Gravity, Urine: 1.018 (ref 1.005–1.030)
WBC, UA: >50 WBC/hpf — ABNORMAL HIGH (ref 0–5)
pH: 5 (ref 5.0–8.0)

## 2020-12-31 LAB — RESP PANEL BY RT-PCR (FLU A&B, COVID) ARPGX2
Influenza A by PCR: NEGATIVE
Influenza B by PCR: NEGATIVE
SARS Coronavirus 2 by RT PCR: NEGATIVE

## 2020-12-31 LAB — COMPREHENSIVE METABOLIC PANEL
ALT: 12 U/L (ref 0–44)
AST: 28 U/L (ref 15–41)
Albumin: 3 g/dL — ABNORMAL LOW (ref 3.5–5.0)
Alkaline Phosphatase: 79 U/L (ref 38–126)
Anion gap: 6 (ref 5–15)
BUN: 16 mg/dL (ref 8–23)
CO2: 24 mmol/L (ref 22–32)
Calcium: 8.2 mg/dL — ABNORMAL LOW (ref 8.9–10.3)
Chloride: 107 mmol/L (ref 98–111)
Creatinine, Ser: 0.67 mg/dL (ref 0.44–1.00)
GFR, Estimated: >60 mL/min (ref >60)
Glucose, Bld: 99 mg/dL (ref 70–99)
Potassium: 3.7 mmol/L (ref 3.5–5.1)
Sodium: 137 mmol/L (ref 135–145)
Total Bilirubin: 0.8 mg/dL (ref 0.3–1.2)
Total Protein: 6.4 g/dL — ABNORMAL LOW (ref 6.5–8.1)

## 2020-12-31 LAB — MAGNESIUM: Magnesium: 2 mg/dL (ref 1.7–2.4)

## 2020-12-31 LAB — BLOOD GAS, VENOUS
Acid-Base Excess: 0.6 mmol/L (ref 0.0–2.0)
Bicarbonate: 24 mmol/L (ref 20.0–28.0)
FIO2: 40
O2 Saturation: 45.1 %
Patient temperature: 37.1
pCO2, Ven: 40.2 mmHg — ABNORMAL LOW (ref 44.0–60.0)
pH, Ven: 7.407 (ref 7.250–7.430)
pO2, Ven: 32.6 mmHg (ref 32.0–45.0)

## 2020-12-31 LAB — TROPONIN I (HIGH SENSITIVITY)
Troponin I (High Sensitivity): 9 ng/L (ref <18)
Troponin I (High Sensitivity): 9 ng/L (ref <18)

## 2020-12-31 LAB — BRAIN NATRIURETIC PEPTIDE: B Natriuretic Peptide: 556 pg/mL — ABNORMAL HIGH (ref 0.0–100.0)

## 2020-12-31 LAB — MRSA NEXT GEN BY PCR, NASAL: MRSA by PCR Next Gen: NOT DETECTED

## 2020-12-31 IMAGING — DX DG CHEST 1V PORT
1 series · 1 of 1 positions shown · non-contrast
Comparison: [DATE]

CLINICAL DATA: Shortness of breath

EXAM:
PORTABLE CHEST 1 VIEW

[chest ap]
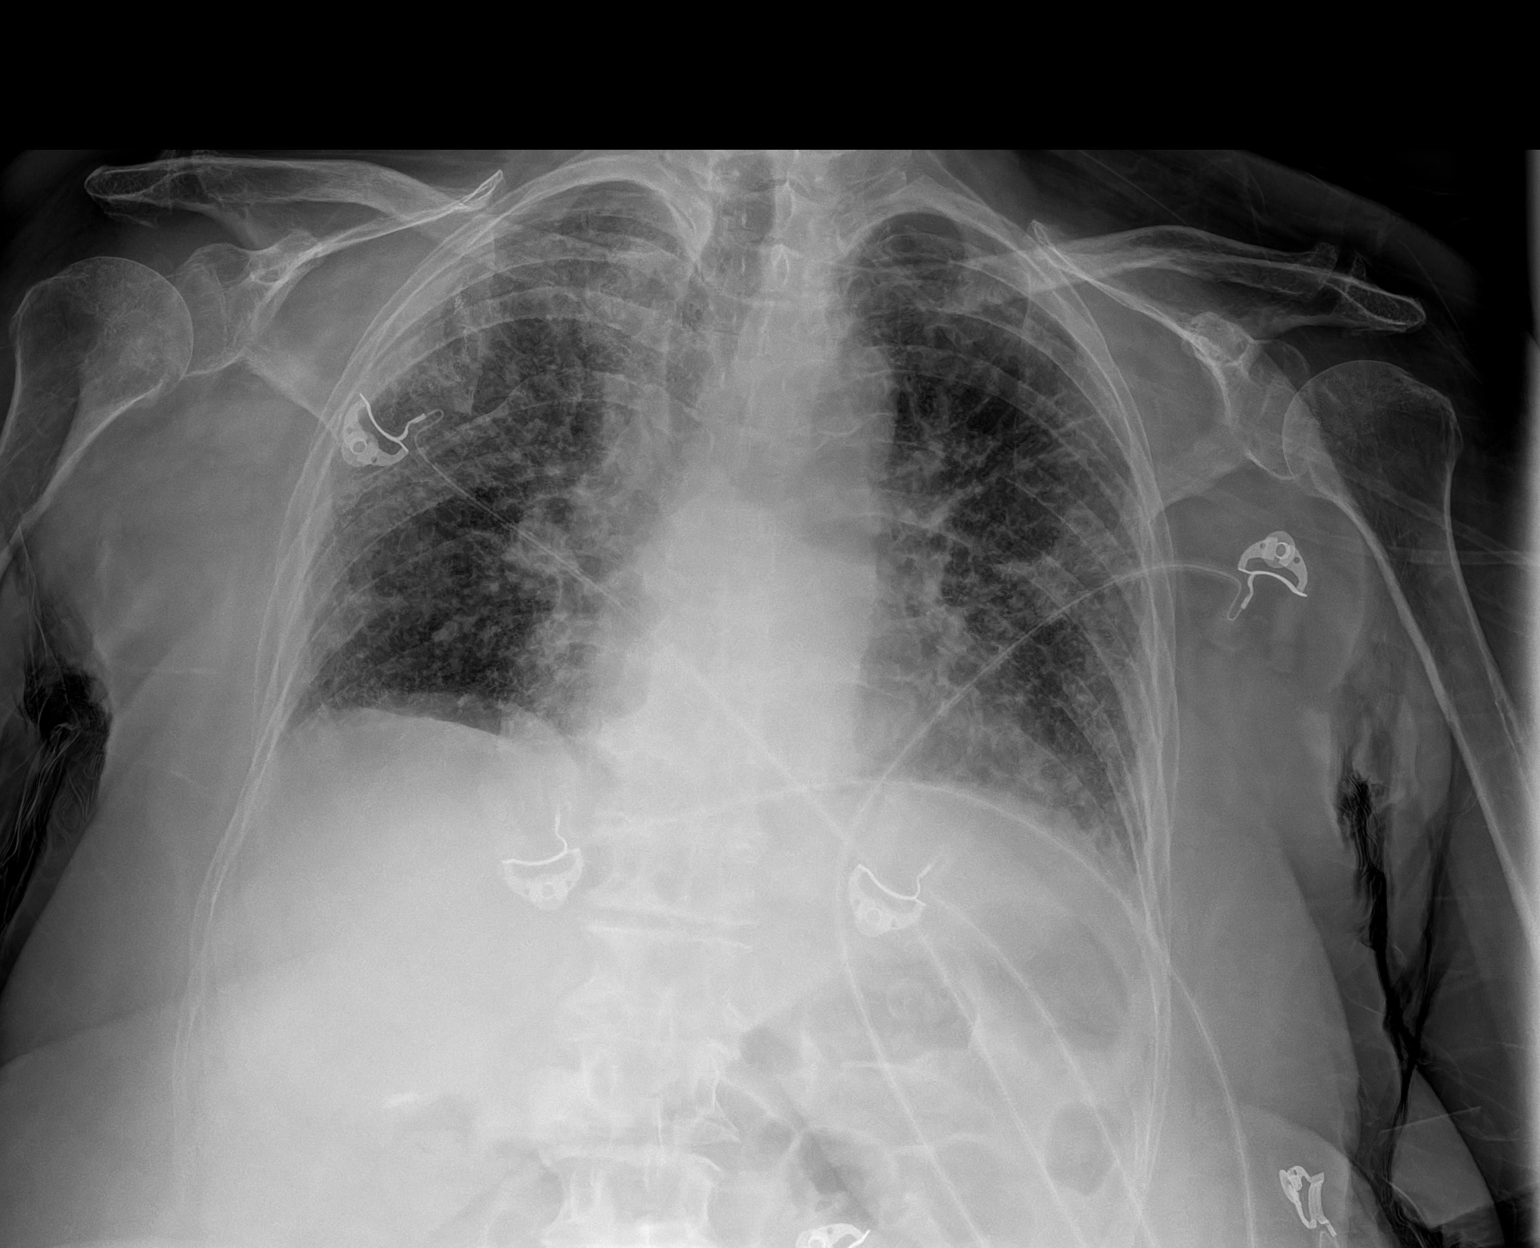

[1 of 1 positions shown; findings below may reference images not displayed]

FINDINGS: The heart size and mediastinal contours are within normal limits.
Mild, diffuse bilateral interstitial pulmonary opacity. The
visualized skeletal structures are unremarkable.
IMPRESSION: Mild, diffuse bilateral interstitial pulmonary opacity, which may
reflect edema or infection. No focal airspace opacity.

## 2020-12-31 IMAGING — CT CT ANGIO CHEST
2 of 6 series · 18 of 46 positions shown · IV contrast (Omnipaque or Isovue)
Comparison: One-view chest x-ray [DATE].  CTA chest [DATE].

CLINICAL DATA: Pulmonary embolus suspected. High probability.
Shortness of breath. Atrial fibrillation. Hypoxia.

EXAM:
CT ANGIOGRAPHY CHEST WITH CONTRAST
TECHNIQUE: Multidetector CT imaging of the chest was performed using the
standard protocol during bolus administration of intravenous
contrast. Multiplanar CT image reconstructions and MIPs were
obtained to evaluate the vascular anatomy.
CONTRAST:  75mL OMNIPAQUE IOHEXOL 350 MG/ML SOLN

[Series 5: pe axial thins · axial · 0.61mm/px · z∈[+596,+837]mm · 15 of 329 slices shown]
[im 14/329  lung]
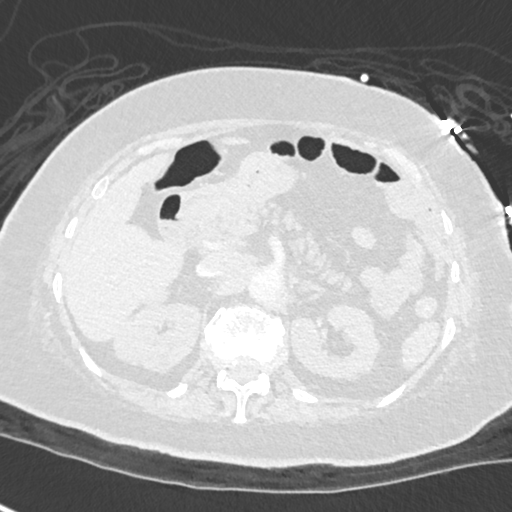
[im 42/329  soft-tissue]
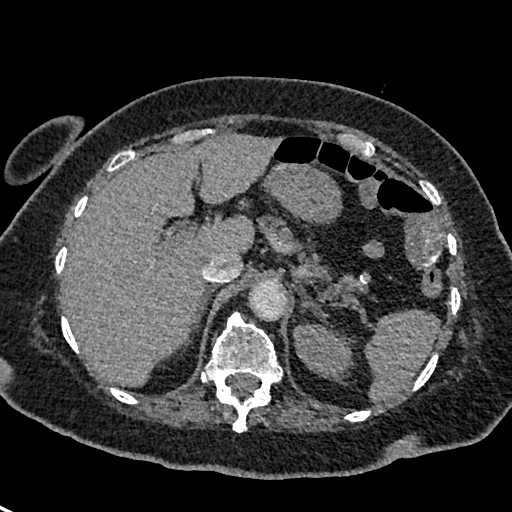
[im 55/329  lung]
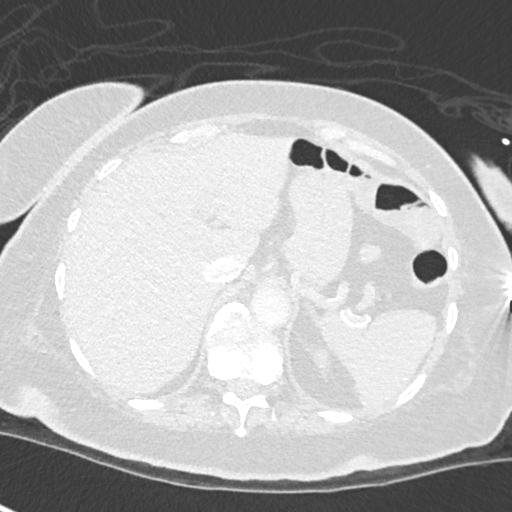
[im 83/329  soft-tissue]
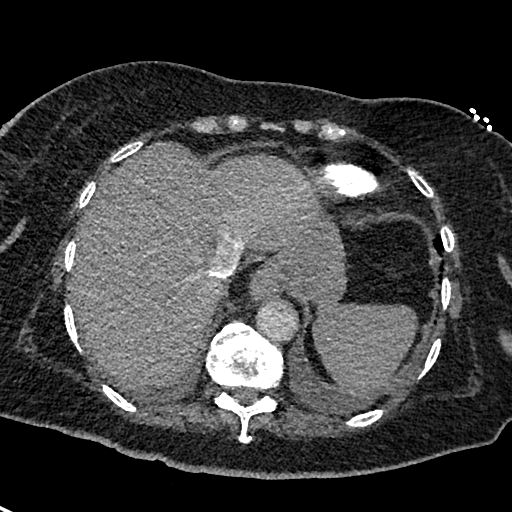
[im 96/329  lung]
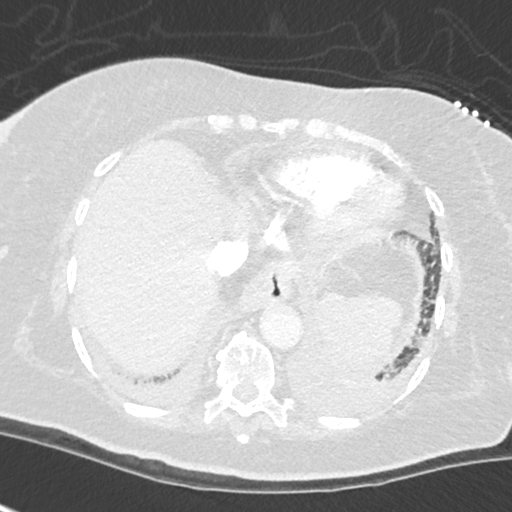
[im 124/329  soft-tissue]
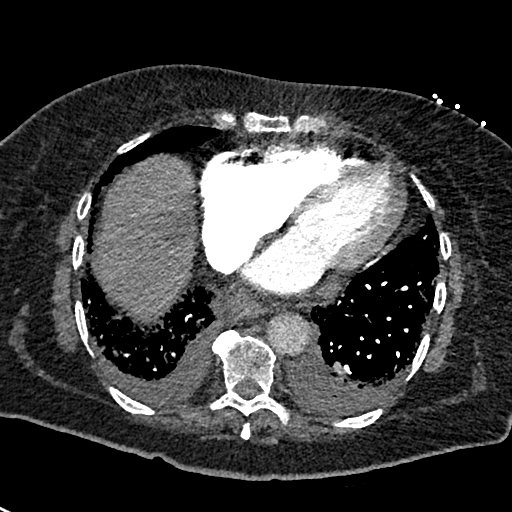
[im 137/329  lung]
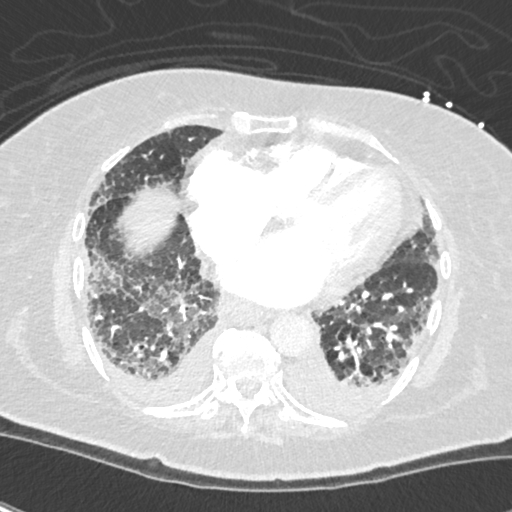
[im 165/329  soft-tissue]
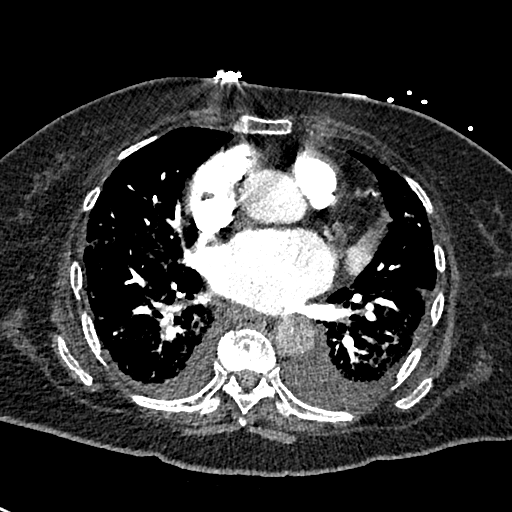
[im 192/329  lung]
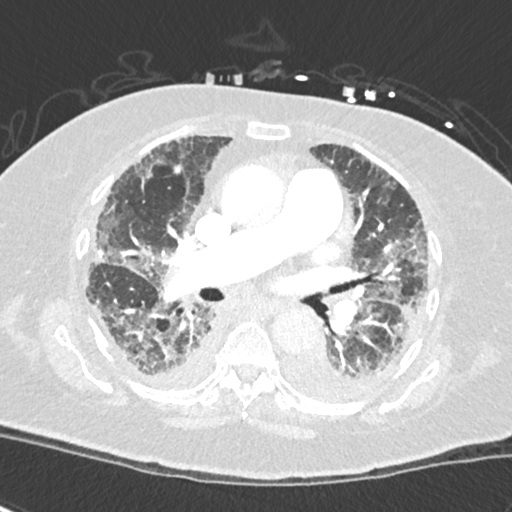
[im 206/329  soft-tissue]
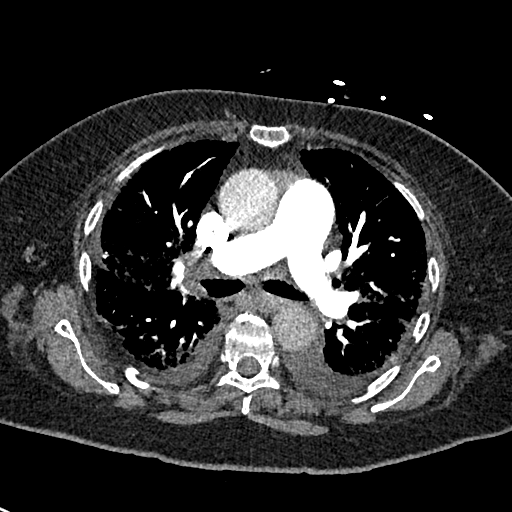
[im 233/329  lung]
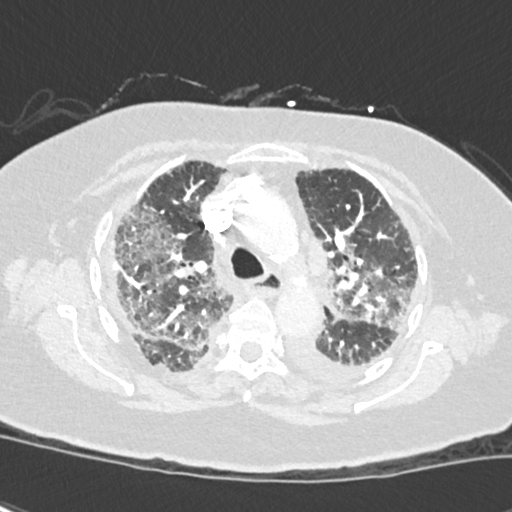
[im 247/329  soft-tissue]
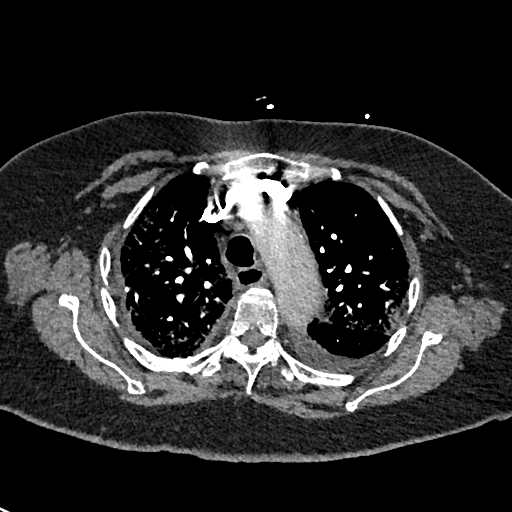
[im 274/329  lung]
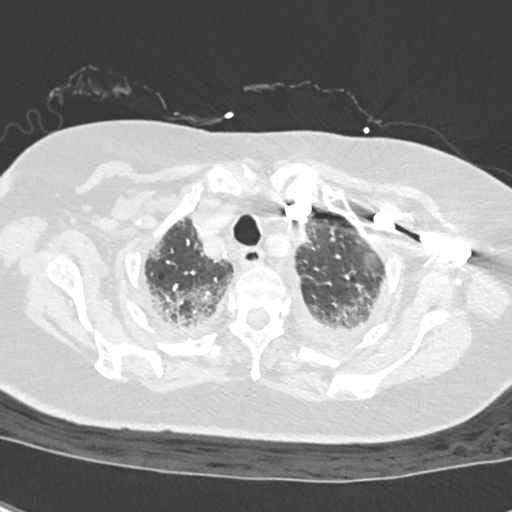
[im 288/329  soft-tissue]
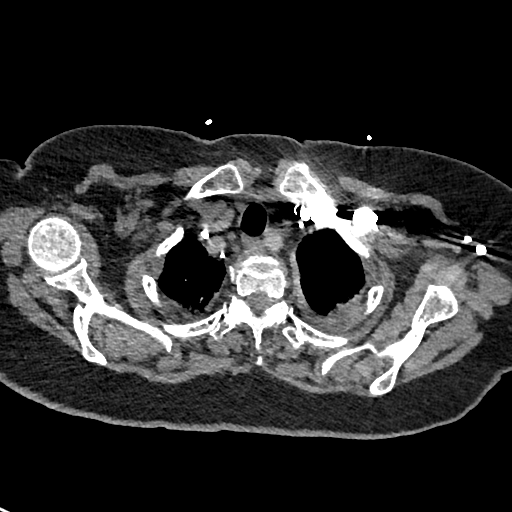
[im 315/329  lung]
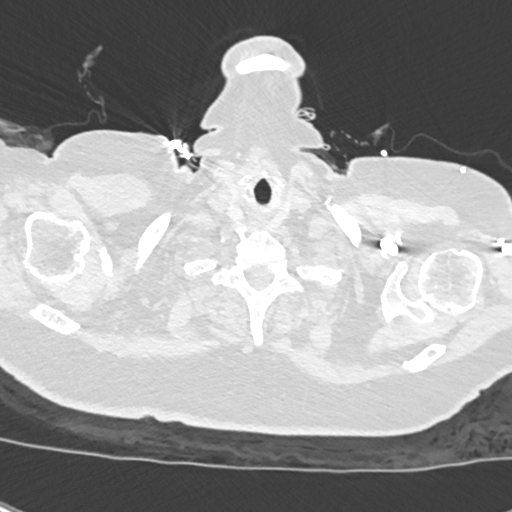

[Series 7: cor soft · coronal · 0.58mm/px · 3 of 135 slices shown]
[im 34/135  soft-tissue]
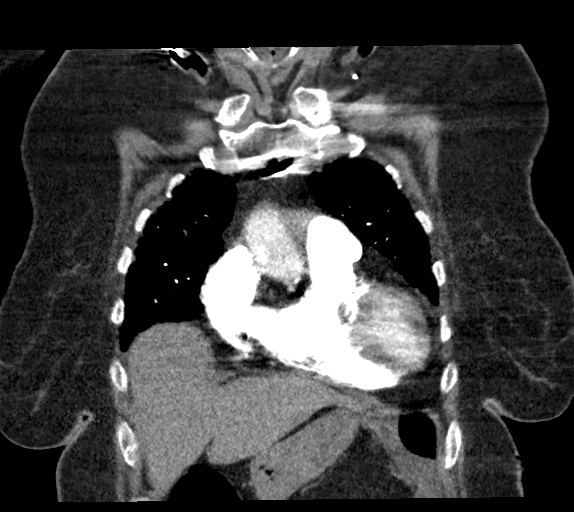
[im 68/135  soft-tissue]
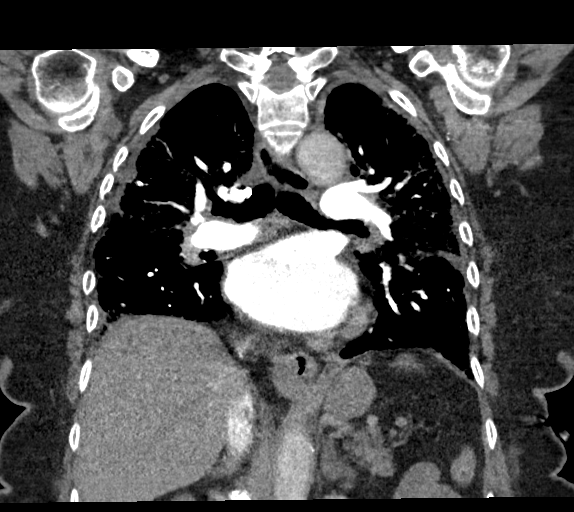
[im 101/135  soft-tissue]
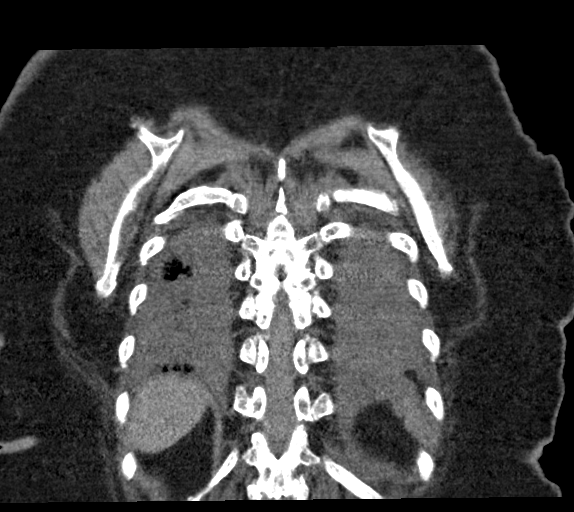

[18 of 46 positions shown; findings below may reference images not displayed]

FINDINGS: Cardiovascular: Heart is enlarged. There are calcifications are
present.

Pulmonary artery opacification is excellent. No focal filling
defects are present to suggest pulmonary embolus. Pulmonary artery
size is normal. Atherosclerotic calcifications are present in the
aortic arch.

Mediastinum/Nodes: Mild bilateral hilar adenopathy is again seen
from right greater than left. No significant mediastinal or axillary
adenopathy is present. Thyroid gland, trachea, and esophagus
demonstrate no significant findings.

Lungs/Pleura: Bilateral pleural effusions are present. Ground-glass
attenuation is again noted bilaterally. No airway obstruction is
present. Areas of dependent atelectasis are present bilaterally.
Calcification is again noted along the minor fissure. 5 mm right
middle lobe calcified nodule again seen.

Upper Abdomen: Cholecystectomy noted. Atherosclerotic changes are
present within the aorta and branch vessels. Upper abdomen is
otherwise unremarkable.

Musculoskeletal: Exaggerated thoracic kyphosis again noted. No focal
osseous lesions are present.

Review of the MIP images confirms the above findings.
IMPRESSION: 1. No evidence for pulmonary embolus.
2. Cardiomegaly with bilateral pleural effusions and ground-glass
attenuation compatible with congestive heart failure and probable
bilateral pneumonia.
3. Aortic Atherosclerosis ([PK]-[PK]).

## 2020-12-31 MED ORDER — ONDANSETRON HCL 4 MG PO TABS: 4.0000 mg | ORAL_TABLET | Freq: Four times a day (QID) | ORAL | Status: DC | PRN

## 2020-12-31 MED ORDER — MELATONIN 5 MG PO TABS
10.0000 mg | ORAL_TABLET | Freq: Every day | ORAL | Status: DC
  Administered 2021-01-02: 10 mg via ORAL
  Filled 2020-12-31 (×7): qty 2

## 2020-12-31 MED ORDER — SODIUM CHLORIDE 0.9 % IV SOLN: 1.0000 g | Freq: Once | INTRAVENOUS | Status: DC

## 2020-12-31 MED ORDER — VANCOMYCIN HCL 1750 MG/350ML IV SOLN
1750.0000 mg | Freq: Once | INTRAVENOUS | Status: AC
  Administered 2020-12-31: 1750 mg via INTRAVENOUS
  Filled 2020-12-31: qty 350

## 2020-12-31 MED ORDER — SODIUM CHLORIDE 0.9 % IV SOLN
2.0000 g | Freq: Once | INTRAVENOUS | Status: AC
  Administered 2020-12-31: 2 g via INTRAVENOUS
  Filled 2020-12-31: qty 2

## 2020-12-31 MED ORDER — LEVALBUTEROL HCL 1.25 MG/0.5ML IN NEBU: 1.2500 mg | INHALATION_SOLUTION | Freq: Four times a day (QID) | RESPIRATORY_TRACT | Status: DC | PRN

## 2020-12-31 MED ORDER — VANCOMYCIN HCL IN DEXTROSE 1-5 GM/200ML-% IV SOLN: 1000.0000 mg | INTRAVENOUS | Status: DC

## 2020-12-31 MED ORDER — MIDODRINE HCL 5 MG PO TABS
5.0000 mg | ORAL_TABLET | Freq: Three times a day (TID) | ORAL | Status: DC
  Administered 2021-01-01 – 2021-01-07 (×20): 5 mg via ORAL
  Filled 2020-12-31 (×20): qty 1

## 2020-12-31 MED ORDER — IOHEXOL 350 MG/ML SOLN
75.0000 mL | Freq: Once | INTRAVENOUS | Status: AC | PRN
  Administered 2020-12-31: 75 mL via INTRAVENOUS

## 2020-12-31 MED ORDER — LACTOBACILLUS PO TABS: 1.0000 | ORAL_TABLET | Freq: Three times a day (TID) | ORAL | Status: DC

## 2020-12-31 MED ORDER — GABAPENTIN 100 MG PO CAPS
200.0000 mg | ORAL_CAPSULE | Freq: Three times a day (TID) | ORAL | Status: DC
  Administered 2020-12-31 – 2021-01-07 (×22): 200 mg via ORAL
  Filled 2020-12-31 (×22): qty 2

## 2020-12-31 MED ORDER — VITAMIN D 25 MCG (1000 UNIT) PO TABS
1000.0000 [IU] | ORAL_TABLET | Freq: Every day | ORAL | Status: DC
  Administered 2021-01-01 – 2021-01-07 (×7): 1000 [IU] via ORAL
  Filled 2020-12-31 (×7): qty 1

## 2020-12-31 MED ORDER — SENNOSIDES-DOCUSATE SODIUM 8.6-50 MG PO TABS: 2.0000 | ORAL_TABLET | Freq: Every evening | ORAL | Status: DC | PRN

## 2020-12-31 MED ORDER — HYDROMORPHONE HCL 1 MG/ML IJ SOLN
0.2500 mg | Freq: Once | INTRAMUSCULAR | Status: AC
  Administered 2020-12-31: 0.25 mg via INTRAVENOUS
  Filled 2020-12-31: qty 1

## 2020-12-31 MED ORDER — ACETAMINOPHEN 325 MG PO TABS
650.0000 mg | ORAL_TABLET | Freq: Four times a day (QID) | ORAL | Status: DC | PRN
  Administered 2021-01-01 – 2021-01-07 (×6): 650 mg via ORAL
  Filled 2020-12-31 (×6): qty 2

## 2020-12-31 MED ORDER — DOCUSATE SODIUM 100 MG PO CAPS
100.0000 mg | ORAL_CAPSULE | Freq: Two times a day (BID) | ORAL | Status: DC
  Administered 2020-12-31 – 2021-01-07 (×13): 100 mg via ORAL
  Filled 2020-12-31 (×13): qty 1

## 2020-12-31 MED ORDER — RISAQUAD PO CAPS
1.0000 | ORAL_CAPSULE | Freq: Every day | ORAL | Status: DC
  Administered 2021-01-01 – 2021-01-07 (×7): 1 via ORAL
  Filled 2020-12-31 (×7): qty 1

## 2020-12-31 MED ORDER — SODIUM CHLORIDE 0.9 % IV SOLN
2.0000 g | Freq: Two times a day (BID) | INTRAVENOUS | Status: DC
  Administered 2020-12-31: 2 g via INTRAVENOUS
  Filled 2020-12-31: qty 2

## 2020-12-31 MED ORDER — ONDANSETRON HCL 4 MG/2ML IJ SOLN
4.0000 mg | Freq: Four times a day (QID) | INTRAMUSCULAR | Status: DC | PRN
  Administered 2021-01-03 – 2021-01-04 (×2): 4 mg via INTRAVENOUS
  Filled 2020-12-31 (×2): qty 2

## 2020-12-31 MED ORDER — PANTOPRAZOLE SODIUM 40 MG PO TBEC
40.0000 mg | DELAYED_RELEASE_TABLET | Freq: Every day | ORAL | Status: DC
  Administered 2020-12-31 – 2021-01-07 (×8): 40 mg via ORAL
  Filled 2020-12-31 (×8): qty 1

## 2020-12-31 MED ORDER — APIXABAN 5 MG PO TABS
5.0000 mg | ORAL_TABLET | Freq: Two times a day (BID) | ORAL | Status: DC
  Administered 2020-12-31 – 2021-01-07 (×14): 5 mg via ORAL
  Filled 2020-12-31 (×14): qty 1

## 2020-12-31 MED ORDER — AMIODARONE HCL IN DEXTROSE 360-4.14 MG/200ML-% IV SOLN
30.0000 mg/h | INTRAVENOUS | Status: DC
  Administered 2020-12-31 – 2021-01-02 (×7): 60 mg/h via INTRAVENOUS
  Administered 2021-01-02: 30 mg/h via INTRAVENOUS
  Filled 2020-12-31 (×8): qty 200

## 2020-12-31 MED ORDER — FUROSEMIDE 10 MG/ML IJ SOLN
40.0000 mg | Freq: Once | INTRAMUSCULAR | Status: AC
  Administered 2020-12-31: 40 mg via INTRAVENOUS
  Filled 2020-12-31: qty 4

## 2020-12-31 MED ORDER — SUCRALFATE 1 G PO TABS
1.0000 g | ORAL_TABLET | Freq: Every day | ORAL | Status: DC
  Administered 2020-12-31 – 2021-01-06 (×7): 1 g via ORAL
  Filled 2020-12-31 (×7): qty 1

## 2020-12-31 MED ORDER — ACETAMINOPHEN 650 MG RE SUPP: 650.0000 mg | Freq: Four times a day (QID) | RECTAL | Status: DC | PRN

## 2020-12-31 NOTE — Plan of Care (Signed)

## 2020-12-31 NOTE — ED Notes (Signed)
Patient transported to CT amiodarone drip to be started when patient returns.

## 2020-12-31 NOTE — Progress Notes (Signed)
Pharmacy Antibiotic Note  Monique Robles is a 80 y.o. female admitted on 12/31/2020 with pneumonia.  Pharmacy has been consulted for Vancomcyin dosing.  Plan: Vancomycin 1750 mg IV x 1 dose. Vancomycin 1000 mg IV every 24 hours. Cefepime 2000 mg IV x 1 dose. F/U continuing orders. Monitor labs, c/s, and vanco level as indicated.  Height: 5\' 1"  (154.9 cm) Weight: 83.5 kg (184 lb 1.4 oz) IBW/kg (Calculated) : 47.8  Temp (24hrs), Avg:98.9 F (37.2 C), Min:98.9 F (37.2 C), Max:98.9 F (37.2 C)  Recent Labs  Lab 12/27/20 1737 12/31/20 1333 12/31/20 1334  WBC 6.7  --   --   CREATININE 0.74 0.67  --   LATICACIDVEN  --   --  1.6    Estimated Creatinine Clearance: 56.8 mL/min (by C-G formula based on SCr of 0.67 mg/dL).    Allergies  Allergen Reactions   Coumadin [Warfarin] Hives   Atorvastatin Other (See Comments)    Muscle aches   Ciprofloxacin    Macrobid [Nitrofurantoin]     Cause respiratory flare   Sulfa Antibiotics Itching and Rash    Antimicrobials this admission: Vanco 10/24 >>  Cefepime 10/24   Microbiology results: 10/24 BCx:  pending 10/24 MRSA PCR: pending  Thank you for allowing pharmacy to be a part of this patient's care.  Margot Ables, PharmD Clinical Pharmacist 12/31/2020 3:07 PM

## 2020-12-31 NOTE — ED Provider Notes (Signed)
[facility] EMERGENCY DEPARTMENT Provider Note   CSN: [id number] Arrival date & time: 12/31/20  1236     History Chief Complaint  Patient presents with   Atrial Fibrillation   Respiratory Distress    Monique Robles is a 80 y.o. female.  HPI  Patient with significant medical history of A. fib currently on Eliquis, CVA, respiratory failure currently on 2 L via nasal cannula, hypertension presents to the emergency department with chief complaint of shortness of breath.  Patient is coming from [facility] rehab facility where they noted she became extremely tachypneic, and hypoxic,  requiring increased supplemental oxygen, despite 5 L via nasal cannula her O2 sats remained in the 70s, patient was presents to the emergency department on a nonrebreather with an O2 sats in the upper 90s.  She states that she feels more short of breath, states that she feels as if she cannot get enough oxygen in, she has no associated chest pain, she denies worsening peripheral edema, worsening orthopnea, she denies any nasal ingestion sore throat, she does state that she had a slight cough, she denies  stomach pain, nausea, vomiting, diarrhea, no recent falls.  She states that she has been taking all of her medications including her Eliquis and her propranolol.  She took her Lasix last night as she normally does.  She states over the last couple days she is will get these intermittent episodes of shortness of breath and heart palpitations, is not worsened with exertion,  last approximate 45 minutes and then resolve on their own.  She currently has no other complaints at this time.  Daughter was at bedside was able to validate the story.   After reviewing patient's chart patient seen here on the 20th for A. fib with RVR, she underwent cardiac conversion and converted back to normal sinus rhythm.  She is supposed to follow-up with her cardiologist for further evaluation.  Past Medical History:  Diagnosis Date    Aortic insufficiency    Atrial fibrillation (HCC)    Diastolic dysfunction    DOE (dyspnea on exertion)    GERD (gastroesophageal reflux disease)    History of pneumonia    History of pulmonary embolism    History of stroke    HTN (hypertension)    Hyperlipemia    Mitral valve regurgitation    Osteoporosis    Tremor    Vitamin D deficiency     Patient Active Problem List   Diagnosis Date Noted   Severe sepsis ([place]) 12/31/2020   Chronic respiratory failure with hypoxia ([place]) 12/06/2020   DOE (dyspnea on exertion) 12/05/2020   Esophageal dysphagia    Chronic lung disease    Drug-induced pneumonitis    Iron deficiency anemia due to chronic blood loss 11/23/2020    Class: Chronic   Acute respiratory failure with hypoxia ([place]) 11/21/2020   GERD (gastroesophageal reflux disease) 11/21/2020   Bronchitis 11/21/2020   Obesity (BMI 30-39.9) 11/21/2020   Insomnia 11/21/2020   Acute respiratory failure ([place]) 11/21/2020   Overactive bladder 08/24/2020   Acute cystitis without hematuria 08/24/2020   Chronic cystitis with hematuria 08/24/2020   Essential tremor 08/15/2019   Cerebrovascular accident (CVA) ([place]) 08/15/2019   Gait abnormality 08/15/2019    Past Surgical History:  Procedure Laterality Date   BALLOON DILATION N/A 11/28/2020   Procedure: BALLOON DILATION;  Surgeon: Rogene Houston, MD;  Location: [facility];  Service: Endoscopy;  Laterality: N/A;   BLADDER SURGERY     x  3   CERVICAL SPINE SURGERY     CHOLECYSTECTOMY     COLONOSCOPY     COLONOSCOPY WITH ESOPHAGOGASTRODUODENOSCOPY (EGD)     ESOPHAGOGASTRODUODENOSCOPY (EGD) WITH PROPOFOL N/A 11/28/2020   Procedure: ESOPHAGOGASTRODUODENOSCOPY (EGD) WITH PROPOFOL;  Surgeon: Rogene Houston, MD;  Location: [facility] END[facility] SU[facility];  Service: Endoscopy;  Laterality: N/A;   LUMBAR SPINE SURGERY     REPLACEMENT TOTAL KNEE Left    RIGHT LUNG WEDGE REMOVAL     TONSILLECTOMY       OB History   No obstetric history on file.      Family History  Problem Relation Age of Onset   Alzheimer's disease Mother        died at 52   Cancer Mother    Tremor Mother    Pneumonia Father    Heart disease Father    Diabetes Father    Heart attack Father        died at 25    Social History   Tobacco Use   Smoking status: Never   Smokeless tobacco: Never  Vaping Use   Vaping Use: Never used  Substance Use Topics   Alcohol use: Not Currently   Drug use: Never    Home Medications Prior to Admission medications   Medication Sig Start Date End Date Taking? Authorizing Provider  Acetaminophen (TYLENOL ARTHRITIS PAIN PO) Take 650 mg by mouth in the morning, at noon, and at bedtime.   Yes [provider]  apixaban (ELIQUIS) 5 MG TABS tablet Take 5 mg by mouth 2 (two) times daily.   Yes [provider]  calcium carbonate (TUMS - DOSED IN MG ELEMENTAL CALCIUM) 500 MG chewable tablet Chew 1 tablet by mouth daily as needed for indigestion or heartburn.   Yes [provider]  cholecalciferol (VITAMIN D3) 25 MCG (1000 UNIT) tablet Take 1,000 Units by mouth daily.   Yes [provider]  cyclobenzaprine (FLEXERIL) 5 MG tablet Take 5 mg by mouth daily as needed. 07/31/20  Yes [provider]  diphenhydrAMINE (BENADRYL) 25 MG tablet Take 25 mg by mouth daily as needed for allergies.   Yes [provider]  furosemide (LASIX) 20 MG tablet Take 20 mg by mouth daily. 07/31/20  Yes [provider]  gabapentin (NEURONTIN) 100 MG capsule Take 200 mg by mouth 3 (three) times daily.   Yes [provider]  Lactobacillus TABS Take 1 tablet by mouth with breakfast, with lunch, and with evening meal.   Yes [provider]  levalbuterol (XOPENEX) 1.25 MG/0.5ML nebulizer solution Take 1.25 mg by nebulization every 6 (six) hours as needed for wheezing or shortness of breath. 11/29/20  Yes Amin, Jeanella Flattery, MD  Melatonin 10 MG TABS Take 10 mg by mouth at bedtime.   Yes  [provider]  midodrine (PROAMATINE) 5 MG tablet Take 5 mg by mouth 3 (three) times daily with meals.   Yes [provider]  Multiple Vitamin (MULTIVITAMIN) tablet Take 1 tablet by mouth daily.   Yes [provider]  omeprazole (PRILOSEC) 40 MG capsule Take 1 capsule by mouth daily. 06/21/20  Yes [provider]  propranolol (INDERAL) 40 MG tablet Take 1 tablet (40 mg total) by mouth 2 (two) times daily. 08/15/19  Yes Marcial Pacas, MD  senna-docusate (SENOKOT-S) 8.6-50 MG tablet Take 2 tablets by mouth at bedtime as needed for moderate constipation. 11/29/20  Yes Amin, Ankit Chirag, MD  sucralfate (CARAFATE) 1 g tablet Take 1 g by  mouth at bedtime.   Yes [provider]  Vibegron (GEMTESA) 75 MG TABS Take 1 capsule by mouth daily. 09/20/20  Yes McKenzie, Candee Furbish, MD  HYDROcodone-acetaminophen (NORCO/VICODIN) 5-325 MG tablet Take 1 tablet by mouth 2 (two) times daily as needed for moderate pain. Patient not taking: No sig reported 11/29/20   Damita Lack, MD  lactobacillus (FLORANEX/LACTINEX) PACK Take 1 packet (1 g total) by mouth 3 (three) times daily with meals. Patient not taking: Reported on 12/31/2020 11/29/20   Damita Lack, MD    Allergies    Coumadin [warfarin], Atorvastatin, Ciprofloxacin, Macrobid [nitrofurantoin], and Sulfa antibiotics  Review of Systems   Review of Systems  Constitutional:  Positive for fatigue. Negative for chills and fever.  HENT:  Negative for congestion.   Respiratory:  Positive for cough and shortness of breath.   Cardiovascular:  Negative for chest pain.  Gastrointestinal:  Negative for abdominal pain.  Genitourinary:  Negative for enuresis.  Musculoskeletal:  Negative for back pain.  Skin:  Negative for rash.  Neurological:  Negative for dizziness.  Hematological:  Does not bruise/bleed easily.   Physical Exam Updated Vital Signs BP 110/67   Pulse (!) 134   Temp 98.9 F (37.2 C) (Axillary)    Resp (!) 26   Ht 5\' 1"  (1.549 m)   Wt 83.5 kg   SpO2 100%   BMI 34.78 kg/m   Physical Exam Vitals and nursing note reviewed.  Constitutional:      General: She is in acute distress.     Appearance: She is not ill-appearing.     Comments: Patient appears to be in acute distress, deconditioned state.  HENT:     Head: Normocephalic and atraumatic.     Nose: No congestion.     Mouth/Throat:     Mouth: Mucous membranes are moist.     Pharynx: Oropharynx is clear.  Eyes:     Conjunctiva/sclera: Conjunctivae normal.  Cardiovascular:     Rate and Rhythm: Tachycardia present. Rhythm irregular.     Pulses: Normal pulses.     Heart sounds: No murmur heard.   No friction rub. No gallop.  Pulmonary:     Effort: Respiratory distress present.     Breath sounds: Wheezing and rales present. No rhonchi.     Comments: Patient appears to be in respiratory distress, requiring 15 L via nonrebreather, she is tachypneic, unable to speak in full sentences, lung sounds are notable for rales with intermitting wheezing heard in the upper lobes. Abdominal:     Palpations: Abdomen is soft.     Tenderness: There is no abdominal tenderness. There is no right CVA tenderness or left CVA tenderness.  Musculoskeletal:        General: Normal range of motion.     Right lower leg: No edema.     Left lower leg: No edema.  Skin:    General: Skin is warm and dry.  Neurological:     Mental Status: She is alert.  Psychiatric:        Mood and Affect: Mood normal.    ED Results / Procedures / Treatments   Labs (all labs ordered are listed, but only abnormal results are displayed) Labs Reviewed  COMPREHENSIVE METABOLIC PANEL - Abnormal; Notable for the following components:      Result Value   Calcium 8.2 (*)    Total Protein 6.4 (*)    Albumin 3.0 (*)    All other components within normal limits  BRAIN NATRIURETIC PEPTIDE - Abnormal; Notable for the following components:   B Natriuretic Peptide 556.0 (*)     All other components within normal limits  URINALYSIS, ROUTINE W REFLEX MICROSCOPIC - Abnormal; Notable for the following components:   Color, Urine AMBER (*)    APPearance TURBID (*)    Hgb urine dipstick SMALL (*)    Protein, ur 30 (*)    Nitrite POSITIVE (*)    Leukocytes,Ua LARGE (*)    WBC, UA >50 (*)    Bacteria, UA MANY (*)    All other components within normal limits  BLOOD GAS, VENOUS - Abnormal; Notable for the following components:   pCO2, Ven 40.2 (*)    All other components within normal limits  CBC WITH DIFFERENTIAL/PLATELET - Abnormal; Notable for the following components:   WBC 12.3 (*)    Hemoglobin 10.9 (*)    MCHC 29.5 (*)    RDW 16.8 (*)    Platelets 602 (*)    Monocytes Absolute 1.5 (*)    All other components within normal limits  RESP PANEL BY RT-PCR (FLU A&B, COVID) ARPGX2  CULTURE, BLOOD (ROUTINE X 2)  CULTURE, BLOOD (ROUTINE X 2)  MRSA NEXT GEN BY PCR, NASAL  LACTIC ACID, PLASMA  LACTIC ACID, PLASMA  MAGNESIUM  PROTIME-INR  CORTISOL-AM, BLOOD  PROCALCITONIN  COMPREHENSIVE METABOLIC PANEL  CBC  TROPONIN I (HIGH SENSITIVITY)  TROPONIN I (HIGH SENSITIVITY)    EKG EKG Interpretation  Date/Time:  Monday December 31 2020 13:46:02 EDT Ventricular Rate:  111 PR Interval:    QRS Duration: 87 QT Interval:  357 QTC Calculation: 404 R Axis:   48 Text Interpretation: Atrial fibrillation Paired ventricular premature complexes Confirmed by [place] Gambler [phone]) on 12/31/2020 1:56:44 PM  Radiology CT Angio Chest PE W and/or Wo Contrast  Result Date: 12/31/2020 CLINICAL DATA:  Pulmonary embolus suspected. High probability. Shortness of breath. Atrial fibrillation. Hypoxia. EXAM: CT ANGIOGRAPHY CHEST WITH CONTRAST TECHNIQUE: Multidetector CT imaging of the chest was performed using the standard protocol during bolus administration of intravenous contrast. Multiplanar CT image reconstructions and MIPs were obtained to evaluate the vascular anatomy.  CONTRAST:  82mL OMNIPAQUE IOHEXOL 350 MG/ML SOLN COMPARISON:  One-view chest x-ray 12/31/2020.  CTA chest 11/22/2020. FINDINGS: Cardiovascular: Heart is enlarged. There are calcifications are present. Pulmonary artery opacification is excellent. No focal filling defects are present to suggest pulmonary embolus. Pulmonary artery size is normal. Atherosclerotic calcifications are present in the aortic arch. Mediastinum/Nodes: Mild bilateral hilar adenopathy is again seen from right greater than left. No significant mediastinal or axillary adenopathy is present. Thyroid gland, trachea, and esophagus demonstrate no significant findings. Lungs/Pleura: Bilateral pleural effusions are present. Ground-glass attenuation is again noted bilaterally. No airway obstruction is present. Areas of dependent atelectasis are present bilaterally. Calcification is again noted along the minor fissure. 5 mm right middle lobe calcified nodule again seen. Upper Abdomen: Cholecystectomy noted. Atherosclerotic changes are present within the aorta and branch vessels. Upper abdomen is otherwise unremarkable. Musculoskeletal: Exaggerated thoracic kyphosis again noted. No focal osseous lesions are present. Review of the MIP images confirms the above findings. IMPRESSION: 1. No evidence for pulmonary embolus. 2. Cardiomegaly with bilateral pleural effusions and ground-glass attenuation compatible with congestive heart failure and probable bilateral pneumonia. 3. Aortic Atherosclerosis (ICD10-I70.0). Electronically Signed   By: San Morelle M.D.   On: 12/31/2020 16:29   DG Chest Port 1 View  Result Date: 12/31/2020 CLINICAL DATA:  Shortness of breath EXAM: PORTABLE CHEST 1 VIEW  COMPARISON:  12/27/2020 FINDINGS: The heart size and mediastinal contours are within normal limits. Mild, diffuse bilateral interstitial pulmonary opacity. The visualized skeletal structures are unremarkable. IMPRESSION: Mild, diffuse bilateral interstitial  pulmonary opacity, which may reflect edema or infection. No focal airspace opacity. Electronically Signed   By: Delanna Ahmadi M.D.   On: 12/31/2020 14:37    Procedures .Critical Care Performed by: Marcello Fennel, PA-C Authorized by: Marcello Fennel, PA-C   Critical care provider statement:    Critical care time (minutes):  50   Critical care time was exclusive of:  Separately billable procedures and treating other patients   Critical care was necessary to treat or prevent imminent or life-threatening deterioration of the following conditions:  Respiratory failure and cardiac failure   Critical care was time spent personally by me on the following activities:  Ordering and performing treatments and interventions, ordering and review of laboratory studies, ordering and review of radiographic studies, pulse oximetry, re-evaluation of patient's condition, review of old charts, obtaining history from patient or surrogate, examination of patient, evaluation of patient's response to treatment and discussions with consultants   I assumed direction of critical care for this patient from another provider in my specialty: no     Care discussed with: admitting provider     Medications Ordered in ED Medications  vancomycin (VANCOCIN) IVPB 1000 mg/200 mL premix (has no administration in time range)  amiodarone (NEXTERONE PREMIX) 360-4.14 MG/200ML-% (1.8 mg/mL) IV infusion (60 mg/hr Intravenous New Bag/Given 12/31/20 1601)  midodrine (PROAMATINE) tablet 5 mg (has no administration in time range)  senna-docusate (Senokot-S) tablet 2 tablet (has no administration in time range)  pantoprazole (PROTONIX) EC tablet 40 mg (40 mg Oral Given 12/31/20 1748)  apixaban (ELIQUIS) tablet 5 mg (has no administration in time range)  gabapentin (NEURONTIN) capsule 200 mg (200 mg Oral Given 12/31/20 1747)  acetaminophen (TYLENOL) tablet 650 mg (has no administration in time range)    Or  acetaminophen (TYLENOL)  suppository 650 mg (has no administration in time range)  docusate sodium (COLACE) capsule 100 mg (has no administration in time range)  ondansetron (ZOFRAN) tablet 4 mg (has no administration in time range)    Or  ondansetron (ZOFRAN) injection 4 mg (has no administration in time range)  ceFEPIme (MAXIPIME) 2 g in sodium chloride 0.9 % 100 mL IVPB (has no administration in time range)  sucralfate (CARAFATE) tablet 1 g (has no administration in time range)  melatonin tablet 10 mg (has no administration in time range)  cholecalciferol (VITAMIN D3) tablet 1,000 Units (has no administration in time range)  levalbuterol (XOPENEX) nebulizer solution 1.25 mg (has no administration in time range)  acidophilus (RISAQUAD) capsule 1 capsule (has no administration in time range)  ceFEPIme (MAXIPIME) 2 g in sodium chloride 0.9 % 100 mL IVPB (0 g Intravenous Stopped 12/31/20 1538)  vancomycin (VANCOREADY) IVPB 1750 mg/350 mL (0 mg Intravenous Stopped 12/31/20 1749)  iohexol (OMNIPAQUE) 350 MG/ML injection 75 mL (75 mLs Intravenous Contrast Given 12/31/20 1559)  HYDROmorphone (DILAUDID) injection 0.25 mg (0.25 mg Intravenous Given 12/31/20 1702)  furosemide (LASIX) injection 40 mg (40 mg Intravenous Given 12/31/20 1758)    ED Course  I have reviewed the triage vital signs and the nursing notes.  Pertinent labs & imaging results that were available during my care of the patient were reviewed by me and considered in my medical decision making (see chart for details).    MDM Rules/Calculators/A&P  Initial impression-presents with shortness of breath.  She is alert, appears in acute distress, vital signs noted for tachycardia and tachypnea.  Concern for CHF exacerbation versus infection versus PE.  Will obtain sepsis-like work-up, and continue to monitor closely.  Work-up-CMP shows calcium 8.2, protein 6.4, albumin 3 0, blood gas PCO2 of 40, respiratory panel unremarkable, for  troponin is 9, BNP 556, EKG A. fib without signs of ischemia.  Chest x-ray reveals mild diffuse bilateral interstitial pulmonary opacities may be edema versus infectious.  Reassessment-patient is weaned off 15 L, she is currently satting upper 90s on 5 L via nasal cannula, she still slight tachypnea, respiratory is 28 times a minute.  BP has remained stabilized.  I am concerned for possible infection at this time we will start her on broad-spectrum antibiotics and continue to monitor.  We will also send down for CTA of chest to rule out PE as well as further investigation of the pulmonary opacities.  Patient's heart rate is now in the 130s to 150 range, will speak with cardiology for further recommendations.  Patient was reassessed after initiation of amiodarone, BP has remained stable, heart rate has improved, remains in the 120s.  We will continue to monitor.  CT of chest reveals bilateral ground glass of bases concern for bilateral pneumonia with evidence of congestive heart failure, will continue with her antibiotic treatments.  Due to her lower BP will withhold Lasix at this time as she does not appear to be severely volume overloaded.  will withhold BiPAP at this time as she is nonhypoxic, VBG is within normal limits, O2 sats are within the mid to upper 90s on 6 L via nasal cannula.  Will recommend admission.  Daughter and patient agreement this plan.  Consult-spoke with Dr. Harrington Challenger who reviewed patient's chart, she feels acutely we can control her heart rate with amiodarone, and continue to watch her blood pressure.  Her team will be available for consultation if needed.  Spoke with Dr. Dwyane Dee who will admit the patient.  Rule out-I have low suspicion for ACS as patient has chest pain, EKG without signs of ischemia, patient had negative delta troponin.  I have low suspicion for PE as she has a negative PE study.  I have low suspicion for dissection as presentation today to of etiology, patient has  low risk factors.  I have low suspicion for intra-abdominal abnormality as abdomen soft nontender to palpation.  Plan-acute respiratory failure likely secondary due to hospital-acquired pneumonia.  A. fib RVR with acute on chronic CHF exacerbation.  Final Clinical Impression(s) / ED Diagnoses Final diagnoses:  Acute respiratory failure with hypoxia ([place])  Hospital-acquired pneumonia  Diastolic congestive heart failure, unspecified HF chronicity [facility])    Rx / DC Orders ED Discharge Orders     None        Marcello Fennel, PA-C 12/31/20 [place], MD 01/01/21 [phone]

## 2020-12-31 NOTE — ED Notes (Signed)
ED Provider at bedside. 

## 2020-12-31 NOTE — Progress Notes (Signed)
Pt placed on 7lpm salter high flow due to questionable spo2, sat probe change to ear probe spo2 100%

## 2020-12-31 NOTE — H&P (Signed)
History and Physical    Monique Robles OHY:[id number] DOB: Apr 23, 1940 DOA: 12/31/2020  PCP: Celene Squibb, MD  Patient coming from:  SNF  I have personally briefly reviewed patient's old medical records in [place]  Chief Complaint: Increasing shortness of breath.  HPI: Monique Robles is a 80 y.o. female with PMH significant of paroxysmal A. fib on Eliquis, diastolic CHF with the last LVEF 65 to 70%, GERD, history of pulmonary embolism, history of stroke, hypertension, hyperlipidemia, vitamin D deficiency, essential tremor presented to the ED with worsening shortness of breath.  Patient was seen in the ED last week and found to have A. fib with RVR.  Patient underwent successful cardioversion into normal sinus rhythm and patient was discharged to rehab.  Patient was discharged on 2 L of supplemental oxygen and remained on 2 L until morning today.  She started having worsening shortness of breath requiring up to 5 L of supplemental oxygen.  She was also found to have A. fib with RVR, with heart rate 150s, after EMS arrived,  She was given 20 mg of IV Cardizem,  heart rate improved but it caused decrease in blood pressure.  She remained hypoxic despite 5 L of supplemental oxygen with  SPO2 70%, she was placed on 100% nonrebreather and brought in the ED. Patient denies any chest pain but remained hypoxic.  ED Course: She was hypoxic requiring up to 15 L of oxygen, tachycardic,  tachypneic and slightly hypotensive. Labs includes sodium 137, potassium 3.7, chloride 107, bicarb 24, glucose 99, BUN 16, creatinine 0.67, calcium 8.2, magnesium 2.0, alkaline phosphatase 79, albumin 3.0, AST 28, ALT 12, total protein 6.4, total bilirubin 0.8, BNP 556, troponin 9, 9, lactic acid 1.6, WBC 12.3, hemoglobin 10.9, hematocrit 36.9, MCV 89.1, platelet 602, UA leukocytes large nitrates positive bacteria many, blood cultures were sent. CTA chest:  No PE. Cardiomegaly with bilateral pleural effusions and  ground-glass attenuation compatible with congestive heart failure and probable bilateral pneumonia.  Review of Systems:  Review of Systems  Constitutional: Negative.   HENT: Negative.    Eyes: Negative.   Respiratory:  Positive for shortness of breath.   Cardiovascular:  Positive for palpitations, orthopnea and leg swelling.  Gastrointestinal: Negative.   Genitourinary: Negative.   Musculoskeletal: Negative.   Skin: Negative.   Neurological:  Positive for tremors.  Endo/Heme/Allergies: Negative.   Psychiatric/Behavioral: Negative.      Past Medical History:  Diagnosis Date   Aortic insufficiency    Atrial fibrillation (HCC)    Diastolic dysfunction    DOE (dyspnea on exertion)    GERD (gastroesophageal reflux disease)    History of pneumonia    History of pulmonary embolism    History of stroke    HTN (hypertension)    Hyperlipemia    Mitral valve regurgitation    Osteoporosis    Tremor    Vitamin D deficiency     Past Surgical History:  Procedure Laterality Date   BALLOON DILATION N/A 11/28/2020   Procedure: BALLOON DILATION;  Surgeon: Rogene Houston, MD;  Location: [facility];  Service: Endoscopy;  Laterality: N/A;   BLADDER SURGERY     x 3   CERVICAL SPINE SURGERY     CHOLECYSTECTOMY     COLONOSCOPY     COLONOSCOPY WITH ESOPHAGOGASTRODUODENOSCOPY (EGD)     ESOPHAGOGASTRODUODENOSCOPY (EGD) WITH PROPOFOL N/A 11/28/2020   Procedure: ESOPHAGOGASTRODUODENOSCOPY (EGD) WITH PROPOFOL;  Surgeon: Rogene Houston, MD;  Location: [facility];  Service: Endoscopy;  Laterality: N/A;   LUMBAR SPINE SURGERY     REPLACEMENT TOTAL KNEE Left    RIGHT LUNG WEDGE REMOVAL     TONSILLECTOMY       reports that she has never smoked. She has never used smokeless tobacco. She reports that she does not currently use alcohol. She reports that she does not use drugs.  Allergies  Allergen Reactions   Coumadin [Warfarin] Hives   Atorvastatin Other (See Comments)    Muscle aches    Ciprofloxacin    Macrobid [Nitrofurantoin]     Cause respiratory flare   Sulfa Antibiotics Itching and Rash    Family History  Problem Relation Age of Onset   Alzheimer's disease Mother        died at 41   Cancer Mother    Tremor Mother    Pneumonia Father    Heart disease Father    Diabetes Father    Heart attack Father        died at 69   Family history reviewed and not pertinent.  Prior to Admission medications   Medication Sig Start Date End Date Taking? Authorizing Provider  Acetaminophen (TYLENOL ARTHRITIS PAIN PO) Take 650 mg by mouth in the morning, at noon, and at bedtime.   Yes [provider]  apixaban (ELIQUIS) 5 MG TABS tablet Take 5 mg by mouth 2 (two) times daily.   Yes [provider]  calcium carbonate (TUMS - DOSED IN MG ELEMENTAL CALCIUM) 500 MG chewable tablet Chew 1 tablet by mouth daily as needed for indigestion or heartburn.   Yes [provider]  cholecalciferol (VITAMIN D3) 25 MCG (1000 UNIT) tablet Take 1,000 Units by mouth daily.   Yes [provider]  cyclobenzaprine (FLEXERIL) 5 MG tablet Take 5 mg by mouth daily as needed. 07/31/20  Yes [provider]  diphenhydrAMINE (BENADRYL) 25 MG tablet Take 25 mg by mouth daily as needed for allergies.   Yes [provider]  furosemide (LASIX) 20 MG tablet Take 20 mg by mouth daily. 07/31/20  Yes [provider]  gabapentin (NEURONTIN) 100 MG capsule Take 200 mg by mouth 3 (three) times daily.   Yes [provider]  Lactobacillus TABS Take 1 tablet by mouth with breakfast, with lunch, and with evening meal.   Yes [provider]  levalbuterol (XOPENEX) 1.25 MG/0.5ML nebulizer solution Take 1.25 mg by nebulization every 6 (six) hours as needed for wheezing or shortness of breath. 11/29/20  Yes Amin, Jeanella Flattery, MD  Melatonin 10 MG TABS Take 10 mg by mouth at bedtime.   Yes [provider]  midodrine (PROAMATINE) 5 MG tablet  Take 5 mg by mouth 3 (three) times daily with meals.   Yes [provider]  Multiple Vitamin (MULTIVITAMIN) tablet Take 1 tablet by mouth daily.   Yes [provider]  omeprazole (PRILOSEC) 40 MG capsule Take 1 capsule by mouth daily. 06/21/20  Yes [provider]  propranolol (INDERAL) 40 MG tablet Take 1 tablet (40 mg total) by mouth 2 (two) times daily. 08/15/19  Yes Marcial Pacas, MD  senna-docusate (SENOKOT-S) 8.6-50 MG tablet Take 2 tablets by mouth at bedtime as needed for moderate constipation. 11/29/20  Yes Amin, Ankit Chirag, MD  sucralfate (CARAFATE) 1 g tablet Take 1 g by mouth at bedtime.   Yes [provider]  Vibegron (GEMTESA) 75 MG TABS Take 1 capsule by mouth daily. 09/20/20  Yes McKenzie, Candee Furbish, MD  HYDROcodone-acetaminophen (  NORCO/VICODIN) 5-325 MG tablet Take 1 tablet by mouth 2 (two) times daily as needed for moderate pain. Patient not taking: No sig reported 11/29/20   Damita Lack, MD  lactobacillus (FLORANEX/LACTINEX) PACK Take 1 packet (1 g total) by mouth 3 (three) times daily with meals. Patient not taking: Reported on 12/31/2020 11/29/20   Damita Lack, MD    Physical Exam: Vitals:   12/31/20 1530 12/31/20 1600 12/31/20 1630 12/31/20 1701  BP: 109/72 118/83 111/73 98/67  Pulse: (!) 154 (!) 144 (!) 130 (!) 118  Resp: (!) 35 (!) 30 (!) 37 (!) 33  Temp:      TempSrc:      SpO2: 100% 90% 95% 98%  Weight:      Height:        Constitutional: Appears in discomfort, respiratory rate 32, heart rate 140, denies any pain. Vitals:   12/31/20 1530 12/31/20 1600 12/31/20 1630 12/31/20 1701  BP: 109/72 118/83 111/73 98/67  Pulse: (!) 154 (!) 144 (!) 130 (!) 118  Resp: (!) 35 (!) 30 (!) 37 (!) 33  Temp:      TempSrc:      SpO2: 100% 90% 95% 98%  Weight:      Height:       Eyes: PERRL, lids and conjunctivae normal ENMT: Mucous membranes are moist. Posterior pharynx clear of any exudate or lesions.Normal dentition.  Neck:  normal, supple, no masses, no thyromegaly Respiratory: Bilateral basilar crackles, increased respiratory rate, no accessory muscle use.   Cardiovascular: Irregular rhythm, rate 140, murmur +, pedal edema+ abdomen: no tenderness, no masses palpated. No hepatosplenomegaly. Bowel sounds positive.  Musculoskeletal: no clubbing / cyanosis. No joint deformity upper and lower extremities. Good ROM, no contractures. Normal muscle tone.  Skin: no rashes, lesions, ulcers. No induration Neurologic: CN 2-12 grossly intact. Sensation intact, DTR normal. Strength 5/5 in all 4.  Psychiatric: Normal judgment and insight. Alert and oriented x 3. Normal mood.     Labs on Admission: I have personally reviewed following labs and imaging studies  CBC: Recent Labs  Lab 12/27/20 1737 12/31/20 1506  WBC 6.7 12.3*  NEUTROABS 3.4 7.1  HGB 10.1* 10.9*  HCT 34.9* 36.9  MCV 90.6 89.1  PLT 453* 712*   Basic Metabolic Panel: Recent Labs  Lab 12/27/20 1737 12/31/20 1333  NA 136 137  K 3.9 3.7  CL 104 107  CO2 22 24  GLUCOSE 108* 99  BUN 12 16  CREATININE 0.74 0.67  CALCIUM 7.9* 8.2*  MG 2.2 2.0   GFR: Estimated Creatinine Clearance: 56.8 mL/min (by C-G formula based on SCr of 0.67 mg/dL). Liver Function Tests: Recent Labs  Lab 12/27/20 1737 12/31/20 1333  AST 29 28  ALT 11 12  ALKPHOS 69 79  BILITOT 0.3 0.8  PROT 6.3* 6.4*  ALBUMIN 3.2* 3.0*   No results for input(s): LIPASE, AMYLASE in the last 168 hours. No results for input(s): AMMONIA in the last 168 hours. Coagulation Profile: Recent Labs  Lab 12/27/20 1737  INR 1.5*   Cardiac Enzymes: No results for input(s): CKTOTAL, CKMB, CKMBINDEX, TROPONINI in the last 168 hours. BNP (last 3 results) Recent Labs    12/05/20 1636  PROBNP 277.0*   HbA1C: No results for input(s): HGBA1C in the last 72 hours. CBG: No results for input(s): GLUCAP in the last 168 hours. Lipid Profile: No results for input(s): CHOL, HDL, LDLCALC, TRIG,  CHOLHDL, LDLDIRECT in the last 72 hours. Thyroid Function Tests: No results for  input(s): TSH, T4TOTAL, FREET4, T3FREE, THYROIDAB in the last 72 hours. Anemia Panel: No results for input(s): VITAMINB12, FOLATE, FERRITIN, TIBC, IRON, RETICCTPCT in the last 72 hours. Urine analysis:    Component Value Date/Time   COLORURINE AMBER (A) 12/31/2020 1628   APPEARANCEUR TURBID (A) 12/31/2020 1628   APPEARANCEUR Clear 09/28/2020 1121   LABSPEC 1.018 12/31/2020 1628   PHURINE 5.0 12/31/2020 1628   GLUCOSEU NEGATIVE 12/31/2020 1628   HGBUR SMALL (A) 12/31/2020 1628   BILIRUBINUR NEGATIVE 12/31/2020 1628   BILIRUBINUR Negative 09/28/2020 [place] 12/31/2020 1628   PROTEINUR 30 (A) 12/31/2020 1628   NITRITE POSITIVE (A) 12/31/2020 1628   LEUKOCYTESUR LARGE (A) 12/31/2020 1628    Radiological Exams on Admission: CT Angio Chest PE W and/or Wo Contrast  Result Date: 12/31/2020 CLINICAL DATA:  Pulmonary embolus suspected. High probability. Shortness of breath. Atrial fibrillation. Hypoxia. EXAM: CT ANGIOGRAPHY CHEST WITH CONTRAST TECHNIQUE: Multidetector CT imaging of the chest was performed using the standard protocol during bolus administration of intravenous contrast. Multiplanar CT image reconstructions and MIPs were obtained to evaluate the vascular anatomy. CONTRAST:  35mL OMNIPAQUE IOHEXOL 350 MG/ML SOLN COMPARISON:  One-view chest x-ray 12/31/2020.  CTA chest 11/22/2020. FINDINGS: Cardiovascular: Heart is enlarged. There are calcifications are present. Pulmonary artery opacification is excellent. No focal filling defects are present to suggest pulmonary embolus. Pulmonary artery size is normal. Atherosclerotic calcifications are present in the aortic arch. Mediastinum/Nodes: Mild bilateral hilar adenopathy is again seen from right greater than left. No significant mediastinal or axillary adenopathy is present. Thyroid gland, trachea, and esophagus demonstrate no significant  findings. Lungs/Pleura: Bilateral pleural effusions are present. Ground-glass attenuation is again noted bilaterally. No airway obstruction is present. Areas of dependent atelectasis are present bilaterally. Calcification is again noted along the minor fissure. 5 mm right middle lobe calcified nodule again seen. Upper Abdomen: Cholecystectomy noted. Atherosclerotic changes are present within the aorta and branch vessels. Upper abdomen is otherwise unremarkable. Musculoskeletal: Exaggerated thoracic kyphosis again noted. No focal osseous lesions are present. Review of the MIP images confirms the above findings. IMPRESSION: 1. No evidence for pulmonary embolus. 2. Cardiomegaly with bilateral pleural effusions and ground-glass attenuation compatible with congestive heart failure and probable bilateral pneumonia. 3. Aortic Atherosclerosis (ICD10-I70.0). Electronically Signed   By: San Morelle M.D.   On: 12/31/2020 16:29   DG Chest Port 1 View  Result Date: 12/31/2020 CLINICAL DATA:  Shortness of breath EXAM: PORTABLE CHEST 1 VIEW COMPARISON:  12/27/2020 FINDINGS: The heart size and mediastinal contours are within normal limits. Mild, diffuse bilateral interstitial pulmonary opacity. The visualized skeletal structures are unremarkable. IMPRESSION: Mild, diffuse bilateral interstitial pulmonary opacity, which may reflect edema or infection. No focal airspace opacity. Electronically Signed   By: Delanna Ahmadi M.D.   On: 12/31/2020 14:37    EKG: Independently reviewed.  Atrial fibrillation  Assessment/Plan Principal Problem:   Severe sepsis (HCC) Active Problems:   Essential tremor   Overactive bladder   GERD (gastroesophageal reflux disease)   Insomnia   Iron deficiency anemia due to chronic blood loss   Chronic lung disease   Chronic respiratory failure with hypoxia (HCC)   A. fib with RVR: Patient presented with increasing shortness of breath associated with palpitations. She was found to  have A. fib with RVR, heart rate 140- 146. Patient was given Cardizem 20 mg IV once. Patient was seen by cardiology, recommended amiodarone given low blood pressure. Continue amiodarone overnight.  Cardiology will follow. Continue Eliquis. Last  echocardiogram EF 65 to 70%.  Mild aortic stenosis.  Acute on chronic hypoxic respiratory failure secondary to pneumonia and CHF; Patient was recently discharged to rehab on 2 L of supplemental oxygen. She remained on 2 L until morning today when her oxygen requirement went up to 5 L and then requiring nonrebreather. She is back on 5 L/ min.  we will continue to wean as tolerated. Supplemental oxygen to keep saturation above 90%. Will give Lasix 40 mg once once blood pressure improves.  Diastolic CHF exacerbation: Her BNP is 554, does not seem fluid overloaded. Will give Lasix once blood pressure improves.  Severe sepsis secondary to UTI bilateral pneumonia: She presented with tachycardia, tachypnea, hypotension ,leukocytosis, UA positive for UTI.  chest x-ray concerning for pneumonia Will treat as hospital-acquired pneumonia since she was recently in the hospital. Follow blood cultures, Continue vancomycin and cefepime Follow procalcitonin level.   Pleural effusion: CT chest shows bilateral pleural effusion It does not seem she needs thoracocentesis.  Orthostatic hypotension: Continue midodrine.  Essential tremor: Will start propranolol when blood pressure improved with   DVT prophylaxis: Eliquis Code Status: Full code Family Communication: No family at bedside Disposition Plan:   Status is: Inpatient  Remains inpatient appropriate because: Severe sepsis secondary to pneumonia and UTI,  A. fib with RVR  Anticipated discharge back to rehab facility in 3 to 4 days.   Consults called: Cardiology Dr. Harrington Challenger Admission status: Inpatient  Shawna Clamp MD [facility]   If 7PM-7AM, please contact  night-coverage   12/31/2020, 6:09 PM

## 2020-12-31 NOTE — ED Triage Notes (Addendum)
Per [facility] pt coming from [facility] and [place] for shortness of breath and A-rib with RVR (HR 150-180). Patient found to be oxygen saturation 74%, placed on 5L [place] and sats in 80s. EMS placed on non breather 15L with sats up to 96%. Patient alert and orientated on arrival.   20 mg cardizem given with 400CC NS given by EMS PTA.

## 2020-12-31 NOTE — Progress Notes (Signed)
Pharmacy Antibiotic Note  Monique Robles is a 80 y.o. female admitted on 12/31/2020 with pneumonia.  Pharmacy has been consulted for Vancomcyin and cefepime dosing.  Plan: Vancomycin 1750 mg IV x 1 dose. Vancomycin 1000 mg IV every 24 hours. Cefepime 2g IV q12h  Height: 5\' 1"  (154.9 cm) Weight: 83.5 kg (184 lb 1.4 oz) IBW/kg (Calculated) : 47.8  Temp (24hrs), Avg:98.9 F (37.2 C), Min:98.9 F (37.2 C), Max:98.9 F (37.2 C)  Recent Labs  Lab 12/27/20 1737 12/31/20 1333 12/31/20 1334 12/31/20 1506  WBC 6.7  --   --  12.3*  CREATININE 0.74 0.67  --   --   LATICACIDVEN  --   --  1.6 1.7     Estimated Creatinine Clearance: 56.8 mL/min (by C-G formula based on SCr of 0.67 mg/dL).    Allergies  Allergen Reactions   Coumadin [Warfarin] Hives   Atorvastatin Other (See Comments)    Muscle aches   Ciprofloxacin    Macrobid [Nitrofurantoin]     Cause respiratory flare   Sulfa Antibiotics Itching and Rash    Antimicrobials this admission: Vanco 10/24 >>  Cefepime 10/24   Microbiology results: 10/24 BCx:  pending 10/24 MRSA PCR: pending  Thank you for allowing pharmacy to be a part of this patient's care.  Arrie Senate, PharmD, BCPS, [facility] Clinical Pharmacist Please check AMION for all [facility] Pharmacy numbers 12/31/2020

## 2020-12-31 NOTE — Consult Note (Addendum)
Cardiology Consultation:   Patient ID: Monique Robles MRN: [id number]; DOB: 1940/10/18  Admit date: 12/31/2020 Date of Consult: 12/31/2020  PCP:  Celene Squibb, MD   [facility] Providers Cardiologist:  Dorris Carnes, MD        Patient Profile:   Monique Robles is a 80 y.o. female with a hx of PAF who is being seen 12/31/2020 for the evaluation of A. fib with RVR at the request of Dr. Verner Chol.  History of Present Illness:   Monique Robles is a 80 year old female patient with history of paroxysmal atrial fibrillation on Eliquis, history of CVA, HLD, diastolic CHF  I last saw the pt in cardiology clinic in July 2022   [facility] complained of SOB wit exertion  Sats did drop with walking   Echo 09/13/2020 normal LVEF 65 to 70% with grade 1 DD; normal RVEF; Mild Aortic stenosis  Lexiscan myoview was negative for ischemia  48 hour Monitor showed SR with few short bursts of SVT   Average HR 67 bpm    IN September 2022 she was admitted for hypoxia, SOB   Dx with bronchitis  Concern for possible drug toxicity (pulmonary from nitrofurantoin  She had been on from Jan 2022 to Sept 2022;  WESR normalized by September to 19))   Hosp stay complicated by afib with RVR   Treated for rate control.  Sent home on 2 L [place]  as well as a prednisone taper and bronchodilators   May have been in afib on discharge  (Eliquis transiently stopped during admit  Resumed on 11/29/20)  She was sent to [facility]   She was seen by Selinda Orion on 12/05/20   On 2L [place]   The pt was seen by Lawanda Cousins on 10/20   Had called in with palpitations     HR " all over the place"  She was placed on midodrine at nursing facility  (not that she had been on antihypertensives ths sumer) At 10/20 visit she was in afib with RVR   Pt taken to ED and underwent cardioversion   Sent home later   Per the daughter, the pt did good on Friday, better than she had been in awhile    Over the weekend, SOB began to come on again     Today presented to [facility] ED  with SOB   Found to be again in Afib with RVR    She denies CP   Breaghing is still short   No dizzienss in bed   Past Medical History:  Diagnosis Date   Aortic insufficiency    Atrial fibrillation (HCC)    Diastolic dysfunction    DOE (dyspnea on exertion)    GERD (gastroesophageal reflux disease)    History of pneumonia    History of pulmonary embolism    History of stroke    HTN (hypertension)    Hyperlipemia    Mitral valve regurgitation    Osteoporosis    Tremor    Vitamin D deficiency     Past Surgical History:  Procedure Laterality Date   BALLOON DILATION N/A 11/28/2020   Procedure: BALLOON DILATION;  Surgeon: Rogene Houston, MD;  Location: [facility];  Service: Endoscopy;  Laterality: N/A;   BLADDER SURGERY     x 3   CERVICAL SPINE SURGERY     CHOLECYSTECTOMY     COLONOSCOPY     COLONOSCOPY WITH ESOPHAGOGASTRODUODENOSCOPY (EGD)     ESOPHAGOGASTRODUODENOSCOPY (EGD) WITH PROPOFOL  N/A 11/28/2020   Procedure: ESOPHAGOGASTRODUODENOSCOPY (EGD) WITH PROPOFOL;  Surgeon: Rogene Houston, MD;  Location: [facility];  Service: Endoscopy;  Laterality: N/A;   LUMBAR SPINE SURGERY     REPLACEMENT TOTAL KNEE Left    RIGHT LUNG WEDGE REMOVAL     TONSILLECTOMY       Home Medications:  Prior to Admission medications   Medication Sig Start Date End Date Taking? Authorizing Provider  Acetaminophen (TYLENOL ARTHRITIS PAIN PO) Take 650 mg by mouth in the morning, at noon, and at bedtime.   Yes [provider]  apixaban (ELIQUIS) 5 MG TABS tablet Take 5 mg by mouth 2 (two) times daily.   Yes [provider]  calcium carbonate (TUMS - DOSED IN MG ELEMENTAL CALCIUM) 500 MG chewable tablet Chew 1 tablet by mouth daily as needed for indigestion or heartburn.   Yes [provider]  cholecalciferol (VITAMIN D3) 25 MCG (1000 UNIT) tablet Take 1,000 Units by mouth daily.   Yes [provider]  cyclobenzaprine (FLEXERIL) 5 MG tablet Take 5 mg by mouth  daily as needed. 07/31/20  Yes [provider]  diphenhydrAMINE (BENADRYL) 25 MG tablet Take 25 mg by mouth daily as needed for allergies.   Yes [provider]  furosemide (LASIX) 20 MG tablet Take 20 mg by mouth daily. 07/31/20  Yes [provider]  gabapentin (NEURONTIN) 100 MG capsule Take 200 mg by mouth 3 (three) times daily.   Yes [provider]  Lactobacillus TABS Take 1 tablet by mouth with breakfast, with lunch, and with evening meal.   Yes [provider]  levalbuterol (XOPENEX) 1.25 MG/0.5ML nebulizer solution Take 1.25 mg by nebulization every 6 (six) hours as needed for wheezing or shortness of breath. 11/29/20  Yes Amin, Jeanella Flattery, MD  Melatonin 10 MG TABS Take 10 mg by mouth at bedtime.   Yes [provider]  midodrine (PROAMATINE) 5 MG tablet Take 5 mg by mouth 3 (three) times daily with meals.   Yes [provider]  Multiple Vitamin (MULTIVITAMIN) tablet Take 1 tablet by mouth daily.   Yes [provider]  omeprazole (PRILOSEC) 40 MG capsule Take 1 capsule by mouth daily. 06/21/20  Yes [provider]  propranolol (INDERAL) 40 MG tablet Take 1 tablet (40 mg total) by mouth 2 (two) times daily. 08/15/19  Yes Marcial Pacas, MD  senna-docusate (SENOKOT-S) 8.6-50 MG tablet Take 2 tablets by mouth at bedtime as needed for moderate constipation. 11/29/20  Yes Amin, Ankit Chirag, MD  sucralfate (CARAFATE) 1 g tablet Take 1 g by mouth at bedtime.   Yes [provider]  Vibegron (GEMTESA) 75 MG TABS Take 1 capsule by mouth daily. 09/20/20  Yes McKenzie, Candee Furbish, MD  HYDROcodone-acetaminophen (NORCO/VICODIN) 5-325 MG tablet Take 1 tablet by mouth 2 (two) times daily as needed for moderate pain. Patient not taking: No sig reported 11/29/20   Damita Lack, MD  lactobacillus (FLORANEX/LACTINEX) PACK Take 1 packet (1 g total) by mouth 3 (three) times daily with meals. Patient not taking: Reported on  12/31/2020 11/29/20   Damita Lack, MD    Inpatient Medications: Scheduled Meds:  acidophilus  1 capsule Oral Daily   apixaban  5 mg Oral BID   [START ON 01/01/2021] cholecalciferol  1,000 Units Oral Daily   docusate sodium  100 mg Oral BID   gabapentin  200 mg Oral TID   melatonin  10 mg Oral QHS   [START ON  01/01/2021] midodrine  5 mg Oral TID WC   pantoprazole  40 mg Oral Daily   sucralfate  1 g Oral QHS   Continuous Infusions:  amiodarone 60 mg/hr (12/31/20 1601)   ceFEPime (MAXIPIME) IV     [START ON 01/01/2021] vancomycin     PRN Meds:   Allergies:    Allergies  Allergen Reactions   Coumadin [Warfarin] Hives   Atorvastatin Other (See Comments)    Muscle aches   Ciprofloxacin    Macrobid [Nitrofurantoin]     Cause respiratory flare   Sulfa Antibiotics Itching and Rash    Social History:   Social History   Socioeconomic History   Marital status: Widowed    Spouse name: Not on file   Number of children: 3   Years of education: 12   Highest education level: High school graduate  Occupational History   Occupation: Retired  Tobacco Use   Smoking status: Never   Smokeless tobacco: Never  Vaping Use   Vaping Use: Never used  Substance and Sexual Activity   Alcohol use: Not Currently   Drug use: Never   Sexual activity: Not on file  Other Topics Concern   Not on file  Social History Narrative   Lives with her daughter.   Right-handed.   Rare caffeine use.   Social Determinants of Health   Financial Resource Strain: Not on file  Food Insecurity: Not on file  Transportation Needs: Not on file  Physical Activity: Not on file  Stress: Not on file  Social Connections: Not on file  Intimate Partner Violence: Not on file    Family History:     Family History  Problem Relation Age of Onset   Alzheimer's disease Mother        died at 38   Cancer Mother    Tremor Mother    Pneumonia Father    Heart disease Father    Diabetes Father    Heart  attack Father        died at 66     ROS:  Please see the history of present illness.   All other ROS reviewed and negative.     Physical Exam/Data:   Vitals:   12/31/20 1530 12/31/20 1600 12/31/20 1630 12/31/20 1701  BP: 109/72 118/83 111/73 98/67  Pulse: (!) 154 (!) 144 (!) 130 (!) 118  Resp: (!) 35 (!) 30 (!) 37 (!) 33  Temp:      TempSrc:      SpO2: 100% 90% 95% 98%  Weight:      Height:       No intake or output data in the 24 hours ending 12/31/20 1805 Last 3 Weights 12/31/2020 12/27/2020 12/27/2020  Weight (lbs) 184 lb 1.4 oz 184 lb 3.2 oz 184 lb 3.2 oz  Weight (kg) 83.5 kg 83.553 kg 83.553 kg     Body mass index is 34.78 kg/m.  Exam not done as she's in CT-will examine tomorrow. General:  Obese 80 yo in mild distress  HEENT: normal Neck: JVP is increased   Vascular: No carotid bruits; Distal pulses 2+ bilaterally Cardiac:  Irreg ireg   No S3  Gr II/VI systolic murmur base   Lungs:  Rales bilaterally, decreased BS at L base   Abd: soft, nontender, no hepatomegaly  Ext: Tr edema Musculoskeletal:  No deformities, BUE and BLE strength normal and equal Skin: warm and dry  Neuro:  CNs 2-12 intact, no focal abnormalities noted Psych:  Normal affect  EKG:  The EKG was personally reviewed and demonstrates:  Afib 111/m Telemetry:  Telemetry was personally reviewed and demonstrates:     Relevant CV Studies:  Echo 09/13/20 IMPRESSIONS     1. Left ventricular ejection fraction, by estimation, is 65 to 70%. The  left ventricle has normal function. The left ventricle has no regional  wall motion abnormalities. Left ventricular diastolic parameters are  consistent with Grade I diastolic  dysfunction (impaired relaxation).   2. Right ventricular systolic function is normal. The right ventricular  size is normal. There is normal pulmonary artery systolic pressure.   3. Left atrial size was mildly dilated.   4. The mitral valve is normal in structure. Mild mitral valve   regurgitation.   5. AV is thickened, calcified with very mildly restricted motion. Peak  and mean gradients through the valve are 20 and 10 mm Hg respectively  consistent with mild AS.Marland Kitchen The aortic valve is tricuspid. Aortic valve  regurgitation is not visualized.   6. The inferior vena cava is normal in size with greater than 50%  respiratory variability, suggesting right atrial pressure of 3 mmHg.   NST 10/2020 No diagnostic ST segment changes indicate ischemia. No significant myocardial perfusion defect indicate scar or ischemia. This is a low risk study. Nuclear stress EF: 68%.    Laboratory Data:  High Sensitivity Troponin:   Recent Labs  Lab 12/31/20 1333 12/31/20 1506  TROPONINIHS 9 9     Chemistry Recent Labs  Lab 12/27/20 1737 12/31/20 1333  NA 136 137  K 3.9 3.7  CL 104 107  CO2 22 24  GLUCOSE 108* 99  BUN 12 16  CREATININE 0.74 0.67  CALCIUM 7.9* 8.2*  MG 2.2 2.0  GFRNONAA >60 >60  ANIONGAP 10 6    Recent Labs  Lab 12/27/20 1737 12/31/20 1333  PROT 6.3* 6.4*  ALBUMIN 3.2* 3.0*  AST 29 28  ALT 11 12  ALKPHOS 69 79  BILITOT 0.3 0.8   Lipids No results for input(s): CHOL, TRIG, HDL, LABVLDL, LDLCALC, CHOLHDL in the last 168 hours.  Hematology Recent Labs  Lab 12/27/20 1737 12/31/20 1506  WBC 6.7 12.3*  RBC 3.85* 4.14  HGB 10.1* 10.9*  HCT 34.9* 36.9  MCV 90.6 89.1  MCH 26.2 26.3  MCHC 28.9* 29.5*  RDW 16.9* 16.8*  PLT 453* 602*   Thyroid No results for input(s): TSH, FREET4 in the last 168 hours.  BNP Recent Labs  Lab 12/31/20 1334  BNP 556.0*    DDimer No results for input(s): DDIMER in the last 168 hours.   Radiology/Studies:  CT Angio Chest PE W and/or Wo Contrast  Result Date: 12/31/2020 CLINICAL DATA:  Pulmonary embolus suspected. High probability. Shortness of breath. Atrial fibrillation. Hypoxia. EXAM: CT ANGIOGRAPHY CHEST WITH CONTRAST TECHNIQUE: Multidetector CT imaging of the chest was performed using the standard  protocol during bolus administration of intravenous contrast. Multiplanar CT image reconstructions and MIPs were obtained to evaluate the vascular anatomy. CONTRAST:  36mL OMNIPAQUE IOHEXOL 350 MG/ML SOLN COMPARISON:  One-view chest x-ray 12/31/2020.  CTA chest 11/22/2020. FINDINGS: Cardiovascular: Heart is enlarged. There are calcifications are present. Pulmonary artery opacification is excellent. No focal filling defects are present to suggest pulmonary embolus. Pulmonary artery size is normal. Atherosclerotic calcifications are present in the aortic arch. Mediastinum/Nodes: Mild bilateral hilar adenopathy is again seen from right greater than left. No significant mediastinal or axillary adenopathy is present. Thyroid gland, trachea, and esophagus demonstrate no significant findings. Lungs/Pleura: Bilateral  pleural effusions are present. Ground-glass attenuation is again noted bilaterally. No airway obstruction is present. Areas of dependent atelectasis are present bilaterally. Calcification is again noted along the minor fissure. 5 mm right middle lobe calcified nodule again seen. Upper Abdomen: Cholecystectomy noted. Atherosclerotic changes are present within the aorta and branch vessels. Upper abdomen is otherwise unremarkable. Musculoskeletal: Exaggerated thoracic kyphosis again noted. No focal osseous lesions are present. Review of the MIP images confirms the above findings. IMPRESSION: 1. No evidence for pulmonary embolus. 2. Cardiomegaly with bilateral pleural effusions and ground-glass attenuation compatible with congestive heart failure and probable bilateral pneumonia. 3. Aortic Atherosclerosis (ICD10-I70.0). Electronically Signed   By: San Morelle M.D.   On: 12/31/2020 16:29   DG Chest Port 1 View  Result Date: 12/31/2020 CLINICAL DATA:  Shortness of breath EXAM: PORTABLE CHEST 1 VIEW COMPARISON:  12/27/2020 FINDINGS: The heart size and mediastinal contours are within normal limits. Mild,  diffuse bilateral interstitial pulmonary opacity. The visualized skeletal structures are unremarkable. IMPRESSION: Mild, diffuse bilateral interstitial pulmonary opacity, which may reflect edema or infection. No focal airspace opacity. Electronically Signed   By: Delanna Ahmadi M.D.   On: 12/31/2020 14:37   DG Chest Portable 1 View  Result Date: 12/27/2020 CLINICAL DATA:  Hypoxia. EXAM: PORTABLE CHEST 1 VIEW COMPARISON:  December 05, 2020. FINDINGS: Stable cardiomediastinal silhouette. Stable bilateral lung opacities are noted most consistent with chronic interstitial lung disease. No definite acute abnormality is noted. Bony thorax is unremarkable. IMPRESSION: Stable chronic findings as described above. No definite acute abnormality is noted. Electronically Signed   By: Marijo Conception M.D.   On: 12/27/2020 18:40     Assessment and Plan:   Recurrent atrial fibrillation with RVR   Pt with hx of PAF   May have been in afib at d/c from [facility] in September   Seen alst week in clinic in afib with RVR, not doing good    Underwent cardioversion   Per daughter did good for 1 day then SOB came back Now back in afib with RVR in setting of UTI and volume overload  (CT does not show a frank infiltrate) I do not think cardioverting the patient now is the right plan.  Physiologic stressors (infection, volume overload) will increase likelihood of recurrence I woud try IV amiodarone at at 60 mg /hour load    This is not an optimal drug give patient's pulmonary histroy   But, I do not think any other antiarrhythmic would be better     Overall I d feel she would feel better/do better in SR   IV amiodarone   Contnue anticoaguatin  If rates remain high, after pt begins treatment for other medical problems (UTI, poss bronchitis) could consider cardioverstion.         CAD-normal NST 04/9560  Acute diastolic CHF   Probably due to afib with RVR in setting of infection.   Whne BP improves would give trial of lasix      Pulmonary   Possible nitrofurantoin toxicity   Now with CT wth edema and/or pneumonia.   ABX  DIurese as BP allows   Blood pressure   PT with hx of hypertension and then hypotension    Continue midodrine for now.      Esophageal dysphagia  Prior CVA  Keep on Eliquis      Risk Assessment/Risk Scores:          CHA2DS2-VASc Score = 7   This indicates a 11.2% annual risk  of stroke. The patient's score is based upon: CHF History: 0 HTN History: 1 Diabetes History: 0 Stroke History: 2 Vascular Disease History: 1 Age Score: 2 Gender Score: 1        For questions or updates, please contact [place] Please consult [email or web address] for contact info under    Signed, Dorris Carnes, MD  12/31/2020 6:05 PM  Patient seen and examined    The history, phyical and assessment are mine     REcommendations as noted   Will continue to follow   Dorris Carnes MD

## 2021-01-01 DIAGNOSIS — Z66 Do not resuscitate: Secondary | ICD-10-CM | POA: Diagnosis not present

## 2021-01-01 DIAGNOSIS — Z20822 Contact with and (suspected) exposure to covid-19: Secondary | ICD-10-CM | POA: Diagnosis not present

## 2021-01-01 DIAGNOSIS — I110 Hypertensive heart disease with heart failure: Secondary | ICD-10-CM | POA: Diagnosis not present

## 2021-01-01 DIAGNOSIS — I480 Paroxysmal atrial fibrillation: Secondary | ICD-10-CM | POA: Diagnosis not present

## 2021-01-01 DIAGNOSIS — A419 Sepsis, unspecified organism: Secondary | ICD-10-CM | POA: Diagnosis not present

## 2021-01-01 DIAGNOSIS — R6520 Severe sepsis without septic shock: Secondary | ICD-10-CM | POA: Diagnosis not present

## 2021-01-01 DIAGNOSIS — I5033 Acute on chronic diastolic (congestive) heart failure: Secondary | ICD-10-CM | POA: Diagnosis not present

## 2021-01-01 LAB — COMPREHENSIVE METABOLIC PANEL
ALT: 10 U/L (ref 0–44)
AST: 22 U/L (ref 15–41)
Albumin: 2.7 g/dL — ABNORMAL LOW (ref 3.5–5.0)
Alkaline Phosphatase: 74 U/L (ref 38–126)
Anion gap: 10 (ref 5–15)
BUN: 18 mg/dL (ref 8–23)
CO2: 24 mmol/L (ref 22–32)
Calcium: 8.4 mg/dL — ABNORMAL LOW (ref 8.9–10.3)
Chloride: 103 mmol/L (ref 98–111)
Creatinine, Ser: 0.74 mg/dL (ref 0.44–1.00)
GFR, Estimated: >60 mL/min (ref >60)
Glucose, Bld: 90 mg/dL (ref 70–99)
Potassium: 3.4 mmol/L — ABNORMAL LOW (ref 3.5–5.1)
Sodium: 137 mmol/L (ref 135–145)
Total Bilirubin: 0.6 mg/dL (ref 0.3–1.2)
Total Protein: 6.1 g/dL — ABNORMAL LOW (ref 6.5–8.1)

## 2021-01-01 LAB — CBC
HCT: 34.7 % — ABNORMAL LOW (ref 36.0–46.0)
Hemoglobin: 10.1 g/dL — ABNORMAL LOW (ref 12.0–15.0)
MCH: 26.3 pg (ref 26.0–34.0)
MCHC: 29.1 g/dL — ABNORMAL LOW (ref 30.0–36.0)
MCV: 90.4 fL (ref 80.0–100.0)
Platelets: 549 10*3/uL — ABNORMAL HIGH (ref 150–400)
RBC: 3.84 MIL/uL — ABNORMAL LOW (ref 3.87–5.11)
RDW: 16.8 % — ABNORMAL HIGH (ref 11.5–15.5)
WBC: 11.1 10*3/uL — ABNORMAL HIGH (ref 4.0–10.5)
nRBC: 0.3 % — ABNORMAL HIGH (ref 0.0–0.2)

## 2021-01-01 LAB — PROTIME-INR
INR: 1.9 — ABNORMAL HIGH (ref 0.8–1.2)
Prothrombin Time: 21.3 seconds — ABNORMAL HIGH (ref 11.4–15.2)

## 2021-01-01 LAB — PROCALCITONIN: Procalcitonin: <0.1 ng/mL

## 2021-01-01 LAB — CORTISOL-AM, BLOOD: Cortisol - AM: 18.3 ug/dL (ref 6.7–22.6)

## 2021-01-01 MED ORDER — FUROSEMIDE 10 MG/ML IJ SOLN
80.0000 mg | Freq: Once | INTRAMUSCULAR | Status: AC
  Administered 2021-01-01: 80 mg via INTRAVENOUS
  Filled 2021-01-01: qty 8

## 2021-01-01 MED ORDER — CHLORHEXIDINE GLUCONATE CLOTH 2 % EX PADS
6.0000 | MEDICATED_PAD | Freq: Every day | CUTANEOUS | Status: DC
  Administered 2020-12-31 – 2021-01-07 (×8): 6 via TOPICAL

## 2021-01-01 MED ORDER — AMIODARONE LOAD VIA INFUSION
150.0000 mg | Freq: Once | INTRAVENOUS | Status: AC
  Administered 2021-01-01: 150 mg via INTRAVENOUS

## 2021-01-01 MED ORDER — MUSCLE RUB 10-15 % EX CREA
TOPICAL_CREAM | CUTANEOUS | Status: DC | PRN
  Administered 2021-01-05: 1 via TOPICAL
  Filled 2021-01-01: qty 85

## 2021-01-01 MED ORDER — SODIUM CHLORIDE 0.9 % IV SOLN
1.0000 g | INTRAVENOUS | Status: AC
  Administered 2021-01-01 – 2021-01-04 (×4): 1 g via INTRAVENOUS
  Filled 2021-01-01 (×4): qty 10

## 2021-01-01 MED ORDER — DILTIAZEM HCL-DEXTROSE 125-5 MG/125ML-% IV SOLN (PREMIX)
5.0000 mg/h | INTRAVENOUS | Status: DC
  Administered 2021-01-01 – 2021-01-02 (×2): 5 mg/h via INTRAVENOUS
  Filled 2021-01-01 (×2): qty 125

## 2021-01-01 MED ORDER — POTASSIUM CHLORIDE CRYS ER 20 MEQ PO TBCR
40.0000 meq | EXTENDED_RELEASE_TABLET | Freq: Once | ORAL | Status: AC
  Administered 2021-01-01: 40 meq via ORAL
  Filled 2021-01-01: qty 2

## 2021-01-01 MED ORDER — PROPRANOLOL HCL 20 MG PO TABS
40.0000 mg | ORAL_TABLET | Freq: Two times a day (BID) | ORAL | Status: DC
  Administered 2021-01-01 – 2021-01-02 (×4): 40 mg via ORAL
  Filled 2021-01-01 (×4): qty 2

## 2021-01-01 NOTE — Progress Notes (Addendum)
PROGRESS NOTE    Saniyya Gau  DXA:[id number] DOB: 1940-08-25 DOA: 12/31/2020 PCP: Celene Squibb, MD   Brief Narrative:   Monique Robles is a 80 y.o. female with PMH significant of paroxysmal A. fib on Eliquis, diastolic CHF with the last LVEF 65 to 70%, GERD, history of pulmonary embolism, history of stroke, hypertension, hyperlipidemia, vitamin D deficiency, essential tremor presented to the ED with worsening shortness of breath.  Patient was seen in the ED last week and found to have A. fib with RVR.  Patient underwent successful cardioversion into normal sinus rhythm and patient was discharged to rehab.  Patient has been admitted with recurrent atrial fibrillation with RVR with associated acute on chronic hypoxemic respiratory failure in the setting of CHF. She also has a UTI.  Assessment & Plan:   Principal Problem:   Severe sepsis ([place]) Active Problems:   Essential tremor   Overactive bladder   GERD (gastroesophageal reflux disease)   Insomnia   Iron deficiency anemia due to chronic blood loss   Chronic lung disease   Chronic respiratory failure with hypoxia (HCC)   Recurrent A. fib with RVR: Patient presented with increasing shortness of breath associated with palpitations. She was found to have A. fib with RVR, heart rate 140- 146. Restart home propranolol continue amiodarone per cardiology with plans to consider repeat DCCV after amnio loaded Continue Eliquis. Last echocardiogram EF 65 to 70%.  Mild aortic stenosis.   Acute on chronic hypoxic respiratory failure secondary to acute on chronic diastolic CHF exacerbation-related to above Patient was recently discharged to rehab on 2 L of supplemental oxygen. She remained on 2 L until morning today when her oxygen requirement went up to 5 L and then requiring nonrebreather. She is back on 5 L/ min.  we will continue to wean as tolerated. Supplemental oxygen to keep saturation above 90%. Patient given Lasix IV x1 dose  overnight   UTI Discontinued cefepime and vancomycin, switched to Rocephin Ordered urine culture to monitor Procalcitonin low  Hypokalemia Replete and reevaluate in a.m.   Pleural effusion: CT chest shows bilateral pleural effusion It does not seem she needs thoracocentesis.   Orthostatic hypotension: Continue midodrine.   Essential tremor: Will restart propranolol today   DVT prophylaxis:Eliquis Code Status: DNR Family Communication: Discussed with daughter on phone 10/25 Disposition Plan:  Status is: Inpatient  Remains inpatient appropriate because: Needs IV medications.  Consultants:  Cardiology  Procedures:  See below  Antimicrobials:  Anti-infectives (From admission, onward)    Start     Dose/Rate Route Frequency Ordered Stop   01/01/21 1600  vancomycin (VANCOCIN) IVPB 1000 mg/200 mL premix  Status:  Discontinued        1,000 mg 200 mL/hr over 60 Minutes Intravenous Every 24 hours 12/31/20 1503 01/01/21 0752   01/01/21 1000  cefTRIAXone (ROCEPHIN) 1 g in sodium chloride 0.9 % 100 mL IVPB        1 g 200 mL/hr over 30 Minutes Intravenous Every 24 hours 01/01/21 0752     12/31/20 2200  ceFEPIme (MAXIPIME) 2 g in sodium chloride 0.9 % 100 mL IVPB  Status:  Discontinued        2 g 200 mL/hr over 30 Minutes Intravenous Every 12 hours 12/31/20 1728 01/01/21 0752   12/31/20 1515  ceFEPIme (MAXIPIME) 2 g in sodium chloride 0.9 % 100 mL IVPB        2 g 200 mL/hr over 30 Minutes Intravenous  Once 12/31/20 1500 12/31/20  1538   12/31/20 1515  vancomycin (VANCOREADY) IVPB 1750 mg/350 mL        1,750 mg 175 mL/hr over 120 Minutes Intravenous  Once 12/31/20 1501 12/31/20 1749   12/31/20 1500  ceFEPIme (MAXIPIME) 1 g in sodium chloride 0.9 % 100 mL IVPB  Status:  Discontinued        1 g 200 mL/hr over 30 Minutes Intravenous  Once 12/31/20 1456 12/31/20 1500      Subjective: Patient seen and evaluated today with ongoing elevated HR. She states her breathing is much  improved.   Objective: Vitals:   01/01/21 0800 01/01/21 0930 01/01/21 1000 01/01/21 1030  BP: 114/70 116/61 94/67 (!) 123/96  Pulse: (!) 116 (!) 50    Resp: (!) 31 (!) 21 (!) 29 (!) 34  Temp: 98.6 F (37 C)     TempSrc: Oral     SpO2: 97% 93%  96%  Weight:      Height:        Intake/Output Summary (Last 24 hours) at 01/01/2021 1114 Last data filed at 01/01/2021 0900 Gross per 24 hour  Intake 838.29 ml  Output 250 ml  Net 588.29 ml   Filed Weights   12/31/20 1252  Weight: 83.5 kg    Examination:  General exam: Appears calm and comfortable  Respiratory system: Clear to auscultation. Respiratory effort normal. On [place]. Cardiovascular system: S1 & S2 heard, tachycardic/irregular Gastrointestinal system: Abdomen is soft Central nervous system: Alert and awake Extremities: No edema Skin: No significant lesions noted Psychiatry: Flat affect.    Data Reviewed: I have personally reviewed following labs and imaging studies  CBC: Recent Labs  Lab 12/27/20 1737 12/31/20 1506 01/01/21 0503  WBC 6.7 12.3* 11.1*  NEUTROABS 3.4 7.1  --   HGB 10.1* 10.9* 10.1*  HCT 34.9* 36.9 34.7*  MCV 90.6 89.1 90.4  PLT 453* 602* 220*   Basic Metabolic Panel: Recent Labs  Lab 12/27/20 1737 12/31/20 1333 01/01/21 0503  NA 136 137 137  K 3.9 3.7 3.4*  CL 104 107 103  CO2 22 24 24   GLUCOSE 108* 99 90  BUN 12 16 18   CREATININE 0.74 0.67 0.74  CALCIUM 7.9* 8.2* 8.4*  MG 2.2 2.0  --    GFR: Estimated Creatinine Clearance: 56.8 mL/min (by C-G formula based on SCr of 0.74 mg/dL). Liver Function Tests: Recent Labs  Lab 12/27/20 1737 12/31/20 1333 01/01/21 0503  AST 29 28 22   ALT 11 12 10   ALKPHOS 69 79 74  BILITOT 0.3 0.8 0.6  PROT 6.3* 6.4* 6.1*  ALBUMIN 3.2* 3.0* 2.7*   No results for input(s): LIPASE, AMYLASE in the last 168 hours. No results for input(s): AMMONIA in the last 168 hours. Coagulation Profile: Recent Labs  Lab 12/27/20 1737 01/01/21 0503  INR 1.5*  1.9*   Cardiac Enzymes: No results for input(s): CKTOTAL, CKMB, CKMBINDEX, TROPONINI in the last 168 hours. BNP (last 3 results) Recent Labs    12/05/20 1636  PROBNP 277.0*   HbA1C: No results for input(s): HGBA1C in the last 72 hours. CBG: No results for input(s): GLUCAP in the last 168 hours. Lipid Profile: No results for input(s): CHOL, HDL, LDLCALC, TRIG, CHOLHDL, LDLDIRECT in the last 72 hours. Thyroid Function Tests: No results for input(s): TSH, T4TOTAL, FREET4, T3FREE, THYROIDAB in the last 72 hours. Anemia Panel: No results for input(s): VITAMINB12, FOLATE, FERRITIN, TIBC, IRON, RETICCTPCT in the last 72 hours. Sepsis Labs: Recent Labs  Lab 12/31/20 1334 12/31/20 1506  01/01/21 0503  PROCALCITON  --   --  <0.10  LATICACIDVEN 1.6 1.7  --     Recent Results (from the past 240 hour(s))  Resp Panel by RT-PCR (Flu A&B, Covid) Nasopharyngeal Swab     Status: None   Collection Time: 12/27/20  5:51 PM   Specimen: Nasopharyngeal Swab; Nasopharyngeal(NP) swabs in vial transport medium  Result Value Ref Range Status   SARS Coronavirus 2 by RT PCR NEGATIVE NEGATIVE Final    Comment: (NOTE) SARS-CoV-2 target nucleic acids are NOT DETECTED.  The SARS-CoV-2 RNA is generally detectable in upper respiratory specimens during the acute phase of infection. The lowest concentration of SARS-CoV-2 viral copies this assay can detect is 138 copies/mL. A negative result does not preclude SARS-Cov-2 infection and should not be used as the sole basis for treatment or other patient management decisions. A negative result may occur with  improper specimen collection/handling, submission of specimen other than nasopharyngeal swab, presence of viral mutation(s) within the areas targeted by this assay, and inadequate number of viral copies(<138 copies/mL). A negative result must be combined with clinical observations, patient history, and epidemiological information. The expected result is  Negative.  Fact Sheet for Patients:  EntrepreneurPulse.com.au  Fact Sheet for Healthcare Providers:  IncredibleEmployment.be  This test is no t yet approved or cleared by the [place] FDA and  has been authorized for detection and/or diagnosis of SARS-CoV-2 by FDA under an Emergency Use Authorization (EUA). This EUA will remain  in effect (meaning this test can be used) for the duration of the COVID-19 declaration under Section 564(b)(1) of the Act, 21 U.S.C.section 360bbb-3(b)(1), unless the authorization is terminated  or revoked sooner.       Influenza A by PCR NEGATIVE NEGATIVE Final   Influenza B by PCR NEGATIVE NEGATIVE Final    Comment: (NOTE) The Xpert Xpress SARS-CoV-2/FLU/RSV plus assay is intended as an aid in the diagnosis of influenza from Nasopharyngeal swab specimens and should not be used as a sole basis for treatment. Nasal washings and aspirates are unacceptable for Xpert Xpress SARS-CoV-2/FLU/RSV testing.  Fact Sheet for Patients: EntrepreneurPulse.com.au  Fact Sheet for Healthcare Providers: IncredibleEmployment.be  This test is not yet approved or cleared by the [place] FDA and has been authorized for detection and/or diagnosis of SARS-CoV-2 by FDA under an Emergency Use Authorization (EUA). This EUA will remain in effect (meaning this test can be used) for the duration of the COVID-19 declaration under Section 564(b)(1) of the Act, 21 U.S.C. section 360bbb-3(b)(1), unless the authorization is terminated or revoked.  Performed at [facility], [address] [place], [place] [address]   Resp Panel by RT-PCR (Flu A&B, Covid) Nasopharyngeal Swab     Status: None   Collection Time: 12/31/20  1:26 PM   Specimen: Nasopharyngeal Swab; Nasopharyngeal(NP) swabs in vial transport medium  Result Value Ref Range Status   SARS Coronavirus 2 by RT PCR NEGATIVE NEGATIVE Final     Comment: (NOTE) SARS-CoV-2 target nucleic acids are NOT DETECTED.  The SARS-CoV-2 RNA is generally detectable in upper respiratory specimens during the acute phase of infection. The lowest concentration of SARS-CoV-2 viral copies this assay can detect is 138 copies/mL. A negative result does not preclude SARS-Cov-2 infection and should not be used as the sole basis for treatment or other patient management decisions. A negative result may occur with  improper specimen collection/handling, submission of specimen other than nasopharyngeal swab, presence of viral mutation(s) within the areas targeted by this assay,  and inadequate number of viral copies(<138 copies/mL). A negative result must be combined with clinical observations, patient history, and epidemiological information. The expected result is Negative.  Fact Sheet for Patients:  EntrepreneurPulse.com.au  Fact Sheet for Healthcare Providers:  IncredibleEmployment.be  This test is no t yet approved or cleared by the [place] FDA and  has been authorized for detection and/or diagnosis of SARS-CoV-2 by FDA under an Emergency Use Authorization (EUA). This EUA will remain  in effect (meaning this test can be used) for the duration of the COVID-19 declaration under Section 564(b)(1) of the Act, 21 U.S.C.section 360bbb-3(b)(1), unless the authorization is terminated  or revoked sooner.       Influenza A by PCR NEGATIVE NEGATIVE Final   Influenza B by PCR NEGATIVE NEGATIVE Final    Comment: (NOTE) The Xpert Xpress SARS-CoV-2/FLU/RSV plus assay is intended as an aid in the diagnosis of influenza from Nasopharyngeal swab specimens and should not be used as a sole basis for treatment. Nasal washings and aspirates are unacceptable for Xpert Xpress SARS-CoV-2/FLU/RSV testing.  Fact Sheet for Patients: EntrepreneurPulse.com.au  Fact Sheet for Healthcare  Providers: IncredibleEmployment.be  This test is not yet approved or cleared by the [place] FDA and has been authorized for detection and/or diagnosis of SARS-CoV-2 by FDA under an Emergency Use Authorization (EUA). This EUA will remain in effect (meaning this test can be used) for the duration of the COVID-19 declaration under Section 564(b)(1) of the Act, 21 U.S.C. section 360bbb-3(b)(1), unless the authorization is terminated or revoked.  Performed at [facility], [address] [place], [place] [address]   Blood culture (routine x 2)     Status: None (Preliminary result)   Collection Time: 12/31/20  1:43 PM   Specimen: Right Antecubital; Blood  Result Value Ref Range Status   Specimen Description RIGHT ANTECUBITAL  Final   Special Requests   Final    BOTTLES DRAWN AEROBIC AND ANAEROBIC Blood Culture results may not be optimal due to an inadequate volume of blood received in culture bottles   Culture   Final    NO GROWTH < 24 HOURS Performed at [facility], [address], [place], [place] [address]    Report Status PENDING  Incomplete  Blood culture (routine x 2)     Status: None (Preliminary result)   Collection Time: 12/31/20  1:46 PM   Specimen: Left Antecubital; Blood  Result Value Ref Range Status   Specimen Description LEFT ANTECUBITAL  Final   Special Requests   Final    BOTTLES DRAWN AEROBIC AND ANAEROBIC Blood Culture adequate volume   Culture   Final    NO GROWTH < 24 HOURS Performed at [facility], [address] [place], [place] [address]    Report Status PENDING  Incomplete  MRSA Next Gen by PCR, Nasal     Status: None   Collection Time: 12/31/20  4:29 PM   Specimen: Nasal Mucosa; Nasal Swab  Result Value Ref Range Status   MRSA by PCR Next Gen NOT DETECTED NOT DETECTED Final    Comment: (NOTE) The GeneXpert MRSA Assay (FDA approved for NASAL specimens only), is one component of a comprehensive MRSA colonization  surveillance program. It is not intended to diagnose MRSA infection nor to guide or monitor treatment for MRSA infections. Test performance is not FDA approved in patients less than 78 years old. Performed at [facility], [address] [place], [place] [address]  Radiology Studies: CT Angio Chest PE W and/or Wo Contrast  Result Date: 12/31/2020 CLINICAL DATA:  Pulmonary embolus suspected. High probability. Shortness of breath. Atrial fibrillation. Hypoxia. EXAM: CT ANGIOGRAPHY CHEST WITH CONTRAST TECHNIQUE: Multidetector CT imaging of the chest was performed using the standard protocol during bolus administration of intravenous contrast. Multiplanar CT image reconstructions and MIPs were obtained to evaluate the vascular anatomy. CONTRAST:  89mL OMNIPAQUE IOHEXOL 350 MG/ML SOLN COMPARISON:  One-view chest x-ray 12/31/2020.  CTA chest 11/22/2020. FINDINGS: Cardiovascular: Heart is enlarged. There are calcifications are present. Pulmonary artery opacification is excellent. No focal filling defects are present to suggest pulmonary embolus. Pulmonary artery size is normal. Atherosclerotic calcifications are present in the aortic arch. Mediastinum/Nodes: Mild bilateral hilar adenopathy is again seen from right greater than left. No significant mediastinal or axillary adenopathy is present. Thyroid gland, trachea, and esophagus demonstrate no significant findings. Lungs/Pleura: Bilateral pleural effusions are present. Ground-glass attenuation is again noted bilaterally. No airway obstruction is present. Areas of dependent atelectasis are present bilaterally. Calcification is again noted along the minor fissure. 5 mm right middle lobe calcified nodule again seen. Upper Abdomen: Cholecystectomy noted. Atherosclerotic changes are present within the aorta and branch vessels. Upper abdomen is otherwise unremarkable. Musculoskeletal: Exaggerated thoracic kyphosis again noted. No focal osseous lesions  are present. Review of the MIP images confirms the above findings. IMPRESSION: 1. No evidence for pulmonary embolus. 2. Cardiomegaly with bilateral pleural effusions and ground-glass attenuation compatible with congestive heart failure and probable bilateral pneumonia. 3. Aortic Atherosclerosis (ICD10-I70.0). Electronically Signed   By: San Morelle M.D.   On: 12/31/2020 16:29   DG Chest Port 1 View  Result Date: 12/31/2020 CLINICAL DATA:  Shortness of breath EXAM: PORTABLE CHEST 1 VIEW COMPARISON:  12/27/2020 FINDINGS: The heart size and mediastinal contours are within normal limits. Mild, diffuse bilateral interstitial pulmonary opacity. The visualized skeletal structures are unremarkable. IMPRESSION: Mild, diffuse bilateral interstitial pulmonary opacity, which may reflect edema or infection. No focal airspace opacity. Electronically Signed   By: Delanna Ahmadi M.D.   On: 12/31/2020 14:37        Scheduled Meds:  acidophilus  1 capsule Oral Daily   apixaban  5 mg Oral BID   Chlorhexidine Gluconate Cloth  6 each Topical Daily   cholecalciferol  1,000 Units Oral Daily   docusate sodium  100 mg Oral BID   gabapentin  200 mg Oral TID   melatonin  10 mg Oral QHS   midodrine  5 mg Oral TID WC   pantoprazole  40 mg Oral Daily   propranolol  40 mg Oral BID   sucralfate  1 g Oral QHS   Continuous Infusions:  amiodarone 60 mg/hr (01/01/21 0806)   cefTRIAXone (ROCEPHIN)  IV 1 g (01/01/21 0856)   diltiazem (CARDIZEM) infusion 5 mg/hr (01/01/21 0855)     LOS: 1 day    Time spent: 35 minutes    Averil Digman Darleen Crocker, DO [facility]  If 7PM-7AM, please contact night-coverage [email or web address] 01/01/2021, 11:14 AM

## 2021-01-01 NOTE — Progress Notes (Signed)
**Note De-Identified Monique Robles Obfuscation** EKG complete by RN

## 2021-01-01 NOTE — Plan of Care (Signed)

## 2021-01-01 NOTE — Progress Notes (Addendum)
Progress Note  Patient Name: Monique Robles Date of Encounter: 01/01/2021  [facility] Cardiologist: Dorris Carnes, MD    Subjective   Breathing better than yest  Inpatient Medications    Scheduled Meds:  acidophilus  1 capsule Oral Daily   apixaban  5 mg Oral BID   Chlorhexidine Gluconate Cloth  6 each Topical Daily   cholecalciferol  1,000 Units Oral Daily   docusate sodium  100 mg Oral BID   gabapentin  200 mg Oral TID   melatonin  10 mg Oral QHS   midodrine  5 mg Oral TID WC   pantoprazole  40 mg Oral Daily   sucralfate  1 g Oral QHS   Continuous Infusions:  amiodarone 30 mg/hr (12/31/20 2240)   ceFEPime (MAXIPIME) IV 2 g (12/31/20 2132)   vancomycin     PRN Meds: acetaminophen **OR** acetaminophen, levalbuterol, ondansetron **OR** ondansetron (ZOFRAN) IV, senna-docusate   Vital Signs    Vitals:   01/01/21 0500 01/01/21 0505 01/01/21 0600 01/01/21 0700  BP:  134/85 (!) 142/101 114/77  Pulse: 60 96 94 (!) 47  Resp: (!) 32 (!) 29 (!) 25 (!) 29  Temp:      TempSrc:      SpO2: 97% 94% 96% 95%  Weight:      Height:        Intake/Output Summary (Last 24 hours) at 01/01/2021 0743 Last data filed at 01/01/2021 0400 Gross per 24 hour  Intake 638.29 ml  Output --  Net 638.29 ml   Last 3 Weights 12/31/2020 12/27/2020 12/27/2020  Weight (lbs) 184 lb 1.4 oz 184 lb 3.2 oz 184 lb 3.2 oz  Weight (kg) 83.5 kg 83.553 kg 83.553 kg      Telemetry    Afib 118-170/m - Personally Reviewed  ECG    Physical Exam    GEN: No acute distress.   Neck: No JVD Cardiac: RRR, no murmurs, rubs, or gallops.  Respiratory: decreased breath sounds with scattered crackles GI: Soft, nontender, non-distended  MS: trace edema; No deformity. Neuro:  Nonfocal  Psych: Normal affect   Labs    High Sensitivity Troponin:   Recent Labs  Lab 12/31/20 1333 12/31/20 1506  TROPONINIHS 9 9     Chemistry Recent Labs  Lab 12/27/20 1737 12/31/20 1333 01/01/21 0503  NA 136  137 137  K 3.9 3.7 3.4*  CL 104 107 103  CO2 22 24 24   GLUCOSE 108* 99 90  BUN 12 16 18   CREATININE 0.74 0.67 0.74  CALCIUM 7.9* 8.2* 8.4*  MG 2.2 2.0  --   PROT 6.3* 6.4* 6.1*  ALBUMIN 3.2* 3.0* 2.7*  AST 29 28 22   ALT 11 12 10   ALKPHOS 69 79 74  BILITOT 0.3 0.8 0.6  GFRNONAA >60 >60 >60  ANIONGAP 10 6 10     Lipids No results for input(s): CHOL, TRIG, HDL, LABVLDL, LDLCALC, CHOLHDL in the last 168 hours.  Hematology Recent Labs  Lab 12/27/20 1737 12/31/20 1506 01/01/21 0503  WBC 6.7 12.3* 11.1*  RBC 3.85* 4.14 3.84*  HGB 10.1* 10.9* 10.1*  HCT 34.9* 36.9 34.7*  MCV 90.6 89.1 90.4  MCH 26.2 26.3 26.3  MCHC 28.9* 29.5* 29.1*  RDW 16.9* 16.8* 16.8*  PLT 453* 602* 549*   Thyroid No results for input(s): TSH, FREET4 in the last 168 hours.  BNP Recent Labs  Lab 12/31/20 1334  BNP 556.0*    DDimer No results for input(s): DDIMER in the last 168  hours.   Radiology    CT Angio Chest PE W and/or Wo Contrast  Result Date: 12/31/2020 CLINICAL DATA:  Pulmonary embolus suspected. High probability. Shortness of breath. Atrial fibrillation. Hypoxia. EXAM: CT ANGIOGRAPHY CHEST WITH CONTRAST TECHNIQUE: Multidetector CT imaging of the chest was performed using the standard protocol during bolus administration of intravenous contrast. Multiplanar CT image reconstructions and MIPs were obtained to evaluate the vascular anatomy. CONTRAST:  99mL OMNIPAQUE IOHEXOL 350 MG/ML SOLN COMPARISON:  One-view chest x-ray 12/31/2020.  CTA chest 11/22/2020. FINDINGS: Cardiovascular: Heart is enlarged. There are calcifications are present. Pulmonary artery opacification is excellent. No focal filling defects are present to suggest pulmonary embolus. Pulmonary artery size is normal. Atherosclerotic calcifications are present in the aortic arch. Mediastinum/Nodes: Mild bilateral hilar adenopathy is again seen from right greater than left. No significant mediastinal or axillary adenopathy is present.  Thyroid gland, trachea, and esophagus demonstrate no significant findings. Lungs/Pleura: Bilateral pleural effusions are present. Ground-glass attenuation is again noted bilaterally. No airway obstruction is present. Areas of dependent atelectasis are present bilaterally. Calcification is again noted along the minor fissure. 5 mm right middle lobe calcified nodule again seen. Upper Abdomen: Cholecystectomy noted. Atherosclerotic changes are present within the aorta and Marisah Laker vessels. Upper abdomen is otherwise unremarkable. Musculoskeletal: Exaggerated thoracic kyphosis again noted. No focal osseous lesions are present. Review of the MIP images confirms the above findings. IMPRESSION: 1. No evidence for pulmonary embolus. 2. Cardiomegaly with bilateral pleural effusions and ground-glass attenuation compatible with congestive heart failure and probable bilateral pneumonia. 3. Aortic Atherosclerosis (ICD10-I70.0). Electronically Signed   By: San Morelle M.D.   On: 12/31/2020 16:29   DG Chest Port 1 View  Result Date: 12/31/2020 CLINICAL DATA:  Shortness of breath EXAM: PORTABLE CHEST 1 VIEW COMPARISON:  12/27/2020 FINDINGS: The heart size and mediastinal contours are within normal limits. Mild, diffuse bilateral interstitial pulmonary opacity. The visualized skeletal structures are unremarkable. IMPRESSION: Mild, diffuse bilateral interstitial pulmonary opacity, which may reflect edema or infection. No focal airspace opacity. Electronically Signed   By: Delanna Ahmadi M.D.   On: 12/31/2020 14:37    Cardiac Studies    Echo 09/13/20 IMPRESSIONS     1. Left ventricular ejection fraction, by estimation, is 65 to 70%. The  left ventricle has normal function. The left ventricle has no regional  wall motion abnormalities. Left ventricular diastolic parameters are  consistent with Grade I diastolic  dysfunction (impaired relaxation).   2. Right ventricular systolic function is normal. The right  ventricular  size is normal. There is normal pulmonary artery systolic pressure.   3. Left atrial size was mildly dilated.   4. The mitral valve is normal in structure. Mild mitral valve  regurgitation.   5. AV is thickened, calcified with very mildly restricted motion. Peak  and mean gradients through the valve are 20 and 10 mm Hg respectively  consistent with mild AS.Marland Kitchen The aortic valve is tricuspid. Aortic valve  regurgitation is not visualized.   6. The inferior vena cava is normal in size with greater than 50%  respiratory variability, suggesting right atrial pressure of 3 mmHg.    NST 10/2020 No diagnostic ST segment changes indicate ischemia. No significant myocardial perfusion defect indicate scar or ischemia. This is a low risk study. Nuclear stress EF: 68%.      Patient Profile     80 y.o. female   with a hx of PAF who is being seen 12/31/2020 for the evaluation of A. fib with  RVR at the request of Dr. Verner Chol.   Assessment & Plan        Recurrent atrial fibrillation with RVR in the setting of UTI and volume overload poss bronchitis/pneumonia. May have been in [place] at d/c from [facility] in September   Seen last week in clinic in afib with RVR, not doing good sent to ED and  Underwent cardioversion   Physiologic stressors (infection, volume overload) will increase likelihood of recurrence Plan IV amiodarone at at 60 mg /hour load    This is not an optimal drug give patient's pulmonary history do not think any other antiarrhythmic would be better     Overall she would feel better/do better in SR   IV amiodarone   Continue Eliquis. Rates still high and BP stable.Consider IV diltiazem to slow rate        CAD-normal NST 10/5927   Acute diastolic CHF   Probably due to afib with RVR in setting of infection.   lasix  40 mg IV given yest but I/O's positive 638, not sure accurate from ED, breathing easier, K being replaced   Pulmonary   Possible nitrofurantoin toxicity   Now with CT  wth edema and/or pneumonia.   ABX  DIurese as BP allows    Blood pressure   PT with hx of hypertension and then hypotension    Continue midodrine for now.       Esophageal dysphagia   Prior CVA  Keep on Eliquis      For questions or updates, please contact [place] Please consult [email or web address] for contact info under        Signed, Ermalinda Barrios, PA-C  01/01/2021, 7:43 AM    Attending note Patient seen and discussed with PA Bonnell Public, I agree with her documentation.History of PAF, she had DCCV during recent ER visit but had recurrent afib in setting of UTI. As outpatient difficultly rate controlling due to low bp's.   Seen in consultation by her primary cardiologist Dr Harrington Challenger on [place], started on IV amiodarone. From notes not optimal drug given pulmonary history  but thought to be best option at this time. Could consider repeat DCCV after amio loaded. Signs of fluid overload on admission, she received IV lasix 40mg  x 1 yesterday. Incomplete I/Os, renal function is stable. Abx for UTI and possible pneumonia per primary team.    Carlyle Dolly MD

## 2021-01-02 ENCOUNTER — Ambulatory Visit: Payer: Medicare Other | Admitting: Urology

## 2021-01-02 DIAGNOSIS — I5033 Acute on chronic diastolic (congestive) heart failure: Secondary | ICD-10-CM | POA: Diagnosis not present

## 2021-01-02 DIAGNOSIS — I4891 Unspecified atrial fibrillation: Secondary | ICD-10-CM | POA: Diagnosis not present

## 2021-01-02 LAB — BASIC METABOLIC PANEL
Anion gap: 11 (ref 5–15)
BUN: 21 mg/dL (ref 8–23)
CO2: 23 mmol/L (ref 22–32)
Calcium: 8.7 mg/dL — ABNORMAL LOW (ref 8.9–10.3)
Chloride: 101 mmol/L (ref 98–111)
Creatinine, Ser: 0.96 mg/dL (ref 0.44–1.00)
GFR, Estimated: >60 mL/min (ref >60)
Glucose, Bld: 105 mg/dL — ABNORMAL HIGH (ref 70–99)
Potassium: 3.8 mmol/L (ref 3.5–5.1)
Sodium: 135 mmol/L (ref 135–145)

## 2021-01-02 LAB — URINE CULTURE: Culture: NO GROWTH

## 2021-01-02 LAB — MAGNESIUM: Magnesium: 1.9 mg/dL (ref 1.7–2.4)

## 2021-01-02 LAB — CBC
HCT: 33.1 % — ABNORMAL LOW (ref 36.0–46.0)
Hemoglobin: 9.5 g/dL — ABNORMAL LOW (ref 12.0–15.0)
MCH: 25.5 pg — ABNORMAL LOW (ref 26.0–34.0)
MCHC: 28.7 g/dL — ABNORMAL LOW (ref 30.0–36.0)
MCV: 89 fL (ref 80.0–100.0)
Platelets: 521 10*3/uL — ABNORMAL HIGH (ref 150–400)
RBC: 3.72 MIL/uL — ABNORMAL LOW (ref 3.87–5.11)
RDW: 16.7 % — ABNORMAL HIGH (ref 11.5–15.5)
WBC: 10.2 10*3/uL (ref 4.0–10.5)
nRBC: 0.2 % (ref 0.0–0.2)

## 2021-01-02 MED ORDER — MAGNESIUM SULFATE 2 GM/50ML IV SOLN
2.0000 g | Freq: Once | INTRAVENOUS | Status: AC
  Administered 2021-01-02: 2 g via INTRAVENOUS
  Filled 2021-01-02: qty 50

## 2021-01-02 MED ORDER — AMIODARONE HCL IN DEXTROSE 360-4.14 MG/200ML-% IV SOLN
30.0000 mg/h | INTRAVENOUS | Status: DC
  Administered 2021-01-03: 30 mg/h via INTRAVENOUS
  Filled 2021-01-02 (×2): qty 200

## 2021-01-02 MED ORDER — POTASSIUM CHLORIDE CRYS ER 20 MEQ PO TBCR
40.0000 meq | EXTENDED_RELEASE_TABLET | Freq: Once | ORAL | Status: AC
  Administered 2021-01-02: 40 meq via ORAL
  Filled 2021-01-02: qty 2

## 2021-01-02 MED ORDER — FUROSEMIDE 10 MG/ML IJ SOLN
80.0000 mg | Freq: Two times a day (BID) | INTRAMUSCULAR | Status: AC
  Administered 2021-01-02 (×2): 80 mg via INTRAVENOUS
  Filled 2021-01-02 (×2): qty 8

## 2021-01-02 MED ORDER — HYDROCODONE-ACETAMINOPHEN 5-325 MG PO TABS
1.0000 | ORAL_TABLET | Freq: Four times a day (QID) | ORAL | Status: DC | PRN
  Administered 2021-01-02 – 2021-01-07 (×4): 1 via ORAL
  Filled 2021-01-02 (×4): qty 1

## 2021-01-02 MED ORDER — CYCLOBENZAPRINE HCL 10 MG PO TABS
5.0000 mg | ORAL_TABLET | Freq: Three times a day (TID) | ORAL | Status: DC | PRN
  Administered 2021-01-02 – 2021-01-06 (×7): 5 mg via ORAL
  Filled 2021-01-02 (×7): qty 1

## 2021-01-02 MED ORDER — METOLAZONE 5 MG PO TABS
2.5000 mg | ORAL_TABLET | Freq: Once | ORAL | Status: AC
  Administered 2021-01-02: 2.5 mg via ORAL
  Filled 2021-01-02: qty 1

## 2021-01-02 NOTE — Plan of Care (Signed)

## 2021-01-02 NOTE — Progress Notes (Deleted)
Cardiology Office Note  Date: 01/02/2021   ID: Monique Robles, Monique Robles 1940-05-09, MRN [id number]  PCP:  Celene Squibb, MD  Cardiologist:  Dorris Carnes, MD Electrophysiologist:  None   Chief Complaint: Hospital follow-up  History of Present Illness: Monique Robles is a 80 y.o. female with a history of CAD, acute respiratory failure with hypoxia, GERD, esophageal dysphagia, obesity, insomnia, IDA, DOE.  Recent hospital admission with progressive shortness of breath and hypoxia.  Admission diagnosis acute respiratory failure with hypoxia.  She was diagnosed with likely bronchitis with intermittent fluid overload requiring Lasix.  There was a possibility she may have drug-related toxicity from nitrofurantoin.  She was seen by pulmonary.  Hospital stay was complicated by atrial fibrillation RVR.  She was started on Cardizem drip then propranolol.  Due to dysphagia she had barium swallow study which showed esophageal motility disorder with plans for endoscopy.  EGD showed benign esophageal stenosis requiring dilation.  Eliquis was resumed on 11/29/2020.   Telephone encounter 12/25/2020 with complaint of palpitations on and off.  Denied any lightheadedness.  Received call from rehabilitation center stating patient's heart rate was all over the place.  Nursing staff spoke with Jeral Fruit at [facility] care who stated patient had been dizzy with activity.  She was placed on midodrine 5 mg by nursing home doctor due to blood pressure dropping and heart rate elevating with activity.  Patient is here today due to issues [facility] rehab and health care with dizziness, fluctuating blood pressures and heart rates.  She is having some issues with hypotension and heart racing when doing more than usual activity.  She is unable to participate fully in her rehab process due to this issue.  She had been started on midodrine 2.5 mg p.o. twice daily then later midodrine 5 mg p.o. 3 times daily.  She was on Cardizem 30  mg p.o. 3 times daily.  Today she presents with continued symptoms.  EKG on arrival shows atrial fibrillation with RVR with a rate of 152.  I spoke to the daughter and patient and we decided given the fact that she will be discharged from the rehab facility tomorrow and will be sent home oral medications may not be the best option to control the heart rate.  We discussed transfer to [facility] for further evaluation and possible cardioversion.  We called EMS and spoke to the cardiologists at [facility] Drs. McDowell and Dr. Harrington Challenger.  Dr. Harrington Challenger had treated her previous to hospital admission with ischemic testing and echocardiogram as well as a cardiac monitor.  She had been previously admitted for acute respiratory failure and was found to be in atrial fibrillation with RVR at prior hospitalization.  She was treated with IV Cardizem.  We determined the best course of action would be for the patient to be sent to [facility] emergency room for treatment and possible cardioversion.  I spoke to Meyer Russel RN charge nurse at ED [facility] and gave report.  Patient is in route to the hospital at this time.  She was cardioverted in the emergency room on 12/27/2020 with 1 biphasic 100 J shock with conversion to normal sinus rhythm and tolerated well without complications.  Per cardiology discussion it was decided to discontinue diltiazem and continue propranolol and follow-up in the cardiology office.  She was seen on 12/31/2020 for evaluation of atrial fibrillation RVR at the request of Dr. Verner Chol by Dr. Harrington Challenger.  Cardiology.  Over the weekend after  cardioversion she had done well shortness of breath began to recur again and she presented to [facility], ED with shortness of breath and found to be again in atrial fibrillation with RVR.    Past Medical History:  Diagnosis Date   Aortic insufficiency    Atrial fibrillation (HCC)    Diastolic dysfunction    DOE (dyspnea on exertion)    GERD (gastroesophageal  reflux disease)    History of pneumonia    History of pulmonary embolism    History of stroke    HTN (hypertension)    Hyperlipemia    Mitral valve regurgitation    Osteoporosis    Tremor    Vitamin D deficiency     Past Surgical History:  Procedure Laterality Date   BALLOON DILATION N/A 11/28/2020   Procedure: BALLOON DILATION;  Surgeon: Rogene Houston, MD;  [facility]: [facility];  Service: Endoscopy;  Laterality: N/A;   BLADDER SURGERY     x 3   CERVICAL SPINE SURGERY     CHOLECYSTECTOMY     COLONOSCOPY     COLONOSCOPY WITH ESOPHAGOGASTRODUODENOSCOPY (EGD)     ESOPHAGOGASTRODUODENOSCOPY (EGD) WITH PROPOFOL N/A 11/28/2020   Procedure: ESOPHAGOGASTRODUODENOSCOPY (EGD) WITH PROPOFOL;  Surgeon: Rogene Houston, MD;  Location: [facility];  Service: Endoscopy;  Laterality: N/A;   LUMBAR SPINE SURGERY     REPLACEMENT TOTAL KNEE Left    RIGHT LUNG WEDGE REMOVAL     TONSILLECTOMY      No current facility-administered medications for this visit.   No current outpatient medications on file.   Facility-Administered Medications Ordered in Other Visits  Medication Dose Route Frequency Provider Last Rate Last Admin   acetaminophen (TYLENOL) tablet 650 mg  650 mg Oral Q6H PRN Shawna Clamp, MD   650 mg at 01/01/21 1943   Or   acetaminophen (TYLENOL) suppository 650 mg  650 mg Rectal Q6H PRN Shawna Clamp, MD       acidophilus (RISAQUAD) capsule 1 capsule  1 capsule Oral Daily Einar Grad, RPH   1 capsule at 01/02/21 0845   amiodarone (NEXTERONE PREMIX) 360-4.14 MG/200ML-% (1.8 mg/mL) IV infusion  30 mg/hr Intravenous Continuous Arnoldo Lenis, MD 16.67 mL/hr at 01/02/21 1647 30 mg/hr at 01/02/21 1647   apixaban (ELIQUIS) tablet 5 mg  5 mg Oral BID Shawna Clamp, MD   5 mg at 01/02/21 0845   cefTRIAXone (ROCEPHIN) 1 g in sodium chloride 0.9 % 100 mL IVPB  1 g Intravenous Q24H Manuella Ghazi, Pratik D, DO   Stopped at 01/02/21 2355   Chlorhexidine Gluconate Cloth 2 % PADS 6 each   6 each Topical Daily Adefeso, Oladapo, DO   6 each at 01/02/21 0847   cholecalciferol (VITAMIN D3) tablet 1,000 Units  1,000 Units Oral Daily Shawna Clamp, MD   1,000 Units at 01/02/21 0847   cyclobenzaprine (FLEXERIL) tablet 5 mg  5 mg Oral TID PRN Kathie Dike, MD   5 mg at 01/02/21 1238   diltiazem (CARDIZEM) 125 mg in dextrose 5% 125 mL (1 mg/mL) infusion  5-15 mg/hr Intravenous Titrated Imogene Burn, PA-C 5 mL/hr at 01/02/21 0624 5 mg/hr at 01/02/21 7322   docusate sodium (COLACE) capsule 100 mg  100 mg Oral BID Shawna Clamp, MD   100 mg at 01/02/21 0846   gabapentin (NEURONTIN) capsule 200 mg  200 mg Oral TID Shawna Clamp, MD   200 mg at 01/02/21 1644   HYDROcodone-acetaminophen (NORCO/VICODIN) 5-325 MG per tablet 1 tablet  1 tablet Oral Q6H PRN Kathie Dike, MD   1 tablet at 01/02/21 1644   levalbuterol (XOPENEX) nebulizer solution 1.25 mg  1.25 mg Nebulization Q6H PRN Shawna Clamp, MD       melatonin tablet 10 mg  10 mg Oral QHS Shawna Clamp, MD       midodrine (PROAMATINE) tablet 5 mg  5 mg Oral TID WC Shawna Clamp, MD   5 mg at 01/02/21 1645   Muscle Rub CREA   Topical PRN Heath Lark D, DO   Given at 01/02/21 1037   ondansetron (ZOFRAN) tablet 4 mg  4 mg Oral Q6H PRN Shawna Clamp, MD       Or   ondansetron [facility]) injection 4 mg  4 mg Intravenous Q6H PRN Shawna Clamp, MD       pantoprazole (PROTONIX) EC tablet 40 mg  40 mg Oral Daily Shawna Clamp, MD   40 mg at 01/02/21 0846   propranolol (INDERAL) tablet 40 mg  40 mg Oral BID Manuella Ghazi, Pratik D, DO   40 mg at 01/02/21 4268   senna-docusate (Senokot-S) tablet 2 tablet  2 tablet Oral QHS PRN Shawna Clamp, MD       sucralfate (CARAFATE) tablet 1 g  1 g Oral QHS Shawna Clamp, MD   1 g at 01/01/21 2159   Allergies:  Coumadin [warfarin], Atorvastatin, Ciprofloxacin, Macrobid [nitrofurantoin], and Sulfa antibiotics   Social History: The patient  reports that she has never smoked. She has never used smokeless  tobacco. She reports that she does not currently use alcohol. She reports that she does not use drugs.   Family History: The patient's family history includes Alzheimer's disease in her mother; Cancer in her mother; Diabetes in her father; Heart attack in her father; Heart disease in her father; Pneumonia in her father; Tremor in her mother.   ROS:  Please see the history of present illness. Otherwise, complete review of systems is positive for none.  All other systems are reviewed and negative.   Physical Exam: VS:  There were no vitals taken for this visit., BMI There is no height or weight on file to calculate BMI.  Wt Readings from Last 3 Encounters:  01/02/21 192 lb 10.9 oz (87.4 kg)  12/27/20 184 lb 3.2 oz (83.6 kg)  12/27/20 184 lb 3.2 oz (83.6 kg)    General: Patient appears comfortable at rest. Neck: Supple, no elevated JVP or carotid bruits, no thyromegaly. Lungs: Clear to auscultation, nonlabored breathing at rest. Cardiac: Tachycardic irregularly irregular rate and rhythm, no S3 or significant systolic murmur, no pericardial rub. Abdomen: Soft, nontender, no hepatomegaly, bowel sounds present, no guarding or rebound. Extremities: No pitting edema, distal pulses 2+. Skin: Warm and dry. Musculoskeletal: No kyphosis. Neuropsychiatric: Alert and oriented x3, affect grossly appropriate.  ECG: 12/27/2020 EKG atrial fibrillation with RVR, rate of 152.  Recent Labwork: 11/21/2020: TSH 1.773 12/05/2020: Pro B Natriuretic peptide (BNP) 277.0 12/31/2020: B Natriuretic Peptide 556.0 01/01/2021: ALT 10; AST 22 01/02/2021: BUN 21; Creatinine, Ser 0.96; Hemoglobin 9.5; Magnesium 1.9; Platelets 521; Potassium 3.8; Sodium 135     Component Value Date/Time   CHOL 229 (H) 08/31/2020 0948   TRIG 92 08/31/2020 0948   HDL 64 08/31/2020 0948   CHOLHDL 3.6 08/31/2020 0948   VLDL 18 08/31/2020 0948   LDLCALC 147 (H) 08/31/2020 0948    Other Studies Reviewed Today:   CT angio chest  12/31/2020 IMPRESSION: 1. No evidence for pulmonary embolus. 2. Cardiomegaly with bilateral pleural  effusions and ground-glass attenuation compatible with congestive heart failure and probable bilateral pneumonia. 3. Aortic Atherosclerosis (ICD10-I70.0).    Nuclear stress test 10/11/2020 Narrative & Impression  No diagnostic ST segment changes indicate ischemia. No significant myocardial perfusion defect indicate scar or ischemia. This is a low risk study. Nuclear stress EF: 68%.       Echocardiogram 09/13/2020 1. Left ventricular ejection fraction, by estimation, is 65 to 70%. The left ventricle has normal function. The left ventricle has no regional wall motion abnormalities. Left ventricular diastolic parameters are consistent with Grade I diastolic dysfunction (impaired relaxation). 2. Right ventricular systolic function is normal. The right ventricular size is normal. There is normal pulmonary artery systolic pressure. 3. Left atrial size was mildly dilated. 4. The mitral valve is normal in structure. Mild mitral valve regurgitation. 5. AV is thickened, calcified with very mildly restricted motion. Peak and mean gradients through the valve are 20 and 10 mm Hg respectively consistent with mild AS.Marland Kitchen The aortic valve is tricuspid. Aortic valve regurgitation is not visualized. 6. The inferior vena cava is normal in size with greater than 50% respiratory variability, suggesting right atrial pressure of 3 mmHg.   Cardiac monitor 09/11/2020 Patch Wear Time:  2 days and 3 hours (2022-06-24T09:18:08-398 to 2022-06-26T12:37:34-0400)   Sinus rhythm   Rates 49 to 179 bpm   Average HR 67 bpm   10 short bursts of SVT   Fastest 179 bpm for 4 beats  Longest 10 seconds    Rare PVCs   No pauses No symptoms/triggered events    Assessment and Plan:  No diagnosis found.  1. Atrial fibrillation with RVR (HCC) EKG today on arrival atrial fibrillation with RVR 152.Marland Kitchen  Patient has been in [facility] rehab  facility the last 25 days since discharge from recent hospital stay.  She has been experiencing on and off episodes of tachycardia, shortness of breath, dizziness, fluctuating blood pressures, activity intolerance, shortness of breath.  Decision was made to transfer patient via EMS to [facility] ED.  To my knowledge she has been on Eliquis 5 mg p.o. twice daily for least 25 days during the time she has been at [facility] rehab.  At rehab facility she was taking Cardizem 30 mg p.o. 3 times daily  Addendum.  12/28/2020 she was seen in [facility] emergency room yesterday and eventually cardioverted back to normal sinus rhythm with a single 100 J shock..  Provider there mentioned she would need an extended stay in rehab due to her debilitated state   2. Acute respiratory failure with hypoxia [facility]) Recent hospital station for acute respiratory failure.  Currently at [facility] rehab with plans to be discharged tomorrow.  She is having issues with fluctuating blood pressures, dizziness, shortness of breath, activity intolerance.  Heart rate on arrival today is 152 she is an atrial fibrillation with RVR on exam.  3. Dizziness Issues with dizziness intermittently so it [facility] rehab.  It appears she has been having paroxysmal symptoms of rapid atrial fibrillation with RVR.  She was prescribed Cardizem 30 mg p.o. 3 times daily which apparently was not effective at controlling her heart rate.  4. Hypotension, unspecified hypotension type Recently having issues with hypotension at [facility] rehab and health care.  The doctor there had placed her on midodrine 2.5 mg p.o. twice daily and subsequently increased it to 5 mg p.o. 3 times daily.  Given the fact that her heart rate is so fast today and atrial fibrillation with RVR hypotension was likely  due to paroxysmal of rapid atrial fibrillation during her stay.  Blood pressure today is 110/80.  Medication Adjustments/Labs and Tests Ordered: Current medicines are reviewed at length with  the patient today.  Concerns regarding medicines are outlined above.   Disposition: Follow-up with Dr. Harrington Challenger or APP  Signed, Levell July, NP 01/02/2021 8:38 PM    [facility] at [place], [place], [place] [address] Phone: [phone]; Fax: [phone]

## 2021-01-02 NOTE — Progress Notes (Signed)
PROGRESS NOTE    Monique Robles  IDP:[id number] DOB: Oct 02, 1940 DOA: 12/31/2020 PCP: Celene Squibb, MD   Brief Narrative:   Monique Robles is a 80 y.o. female with PMH significant of paroxysmal A. fib on Eliquis, diastolic CHF with the last LVEF 65 to 70%, GERD, history of pulmonary embolism, history of stroke, hypertension, hyperlipidemia, vitamin D deficiency, essential tremor presented to the ED with worsening shortness of breath.  Patient was seen in the ED last week and found to have A. fib with RVR.  Patient underwent successful cardioversion into normal sinus rhythm and patient was discharged to rehab.  Patient has been admitted with recurrent atrial fibrillation with RVR with associated acute on chronic hypoxemic respiratory failure in the setting of CHF. She also has a UTI.  Assessment & Plan:   Principal Problem:   Severe sepsis ([place]) Active Problems:   Essential tremor   Overactive bladder   GERD (gastroesophageal reflux disease)   Insomnia   Iron deficiency anemia due to chronic blood loss   Chronic lung disease   Chronic respiratory failure with hypoxia (HCC)   Recurrent A. fib with RVR: Patient presented with increasing shortness of breath associated with palpitations. She was found to have A. fib with RVR, heart rate 140- 146. Restarted home propranolol, continue amiodarone per cardiology with plans to consider repeat DCCV after amnio loaded, once she has stabilized Continue Eliquis. Last echocardiogram EF 65 to 70%.  Mild aortic stenosis.   Acute on chronic hypoxic respiratory failure secondary to acute on chronic diastolic CHF exacerbation-related to above Patient was recently discharged to rehab on 2 L of supplemental oxygen. She remained on 2 L until oxygen had to be titrated up to NRB on 10/25 Oxygen has since been weaned down to 4L Supplemental oxygen to keep saturation above 90%. She is continued on IV lasix Cardiology following   UTI Discontinued  cefepime and vancomycin, switched to Rocephin Urine culture in process Procalcitonin low  Hypokalemia Replete and reevaluate in a.m.   Pleural effusion: CT chest shows bilateral pleural effusion It does not seem she needs thoracocentesis.   Orthostatic hypotension: Continue midodrine.   Essential tremor: Continue on propranolol   DVT prophylaxis:Eliquis Code Status: DNR Family Communication: Discussed with daughter at bedside on 10/26 Disposition Plan:  Status is: Inpatient  Remains inpatient appropriate because: Needs IV medications.  Consultants:  Cardiology  Procedures:  See below  Antimicrobials:  Anti-infectives (From admission, onward)    Start     Dose/Rate Route Frequency Ordered Stop   01/01/21 1600  vancomycin (VANCOCIN) IVPB 1000 mg/200 mL premix  Status:  Discontinued        1,000 mg 200 mL/hr over 60 Minutes Intravenous Every 24 hours 12/31/20 1503 01/01/21 0752   01/01/21 1000  cefTRIAXone (ROCEPHIN) 1 g in sodium chloride 0.9 % 100 mL IVPB        1 g 200 mL/hr over 30 Minutes Intravenous Every 24 hours 01/01/21 0752     12/31/20 2200  ceFEPIme (MAXIPIME) 2 g in sodium chloride 0.9 % 100 mL IVPB  Status:  Discontinued        2 g 200 mL/hr over 30 Minutes Intravenous Every 12 hours 12/31/20 1728 01/01/21 0752   12/31/20 1515  ceFEPIme (MAXIPIME) 2 g in sodium chloride 0.9 % 100 mL IVPB        2 g 200 mL/hr over 30 Minutes Intravenous  Once 12/31/20 1500 12/31/20 1538   12/31/20 1515  vancomycin (VANCOREADY)  IVPB 1750 mg/350 mL        1,750 mg 175 mL/hr over 120 Minutes Intravenous  Once 12/31/20 1501 12/31/20 1749   12/31/20 1500  ceFEPIme (MAXIPIME) 1 g in sodium chloride 0.9 % 100 mL IVPB  Status:  Discontinued        1 g 200 mL/hr over 30 Minutes Intravenous  Once 12/31/20 1456 12/31/20 1500      Subjective: Complains of pain in her legs for the last 1-2 days. Feels that her breathing is improving.  Objective: Vitals:   01/02/21 0300  01/02/21 0400 01/02/21 0542 01/02/21 0734  BP: 112/81 107/73    Pulse: (!) 44 71  98  Resp: (!) 30 18  (!) 24  Temp:   97.6 F (36.4 C) (!) 97.3 F (36.3 C)  TempSrc:   Axillary Oral  SpO2: 99% 97%  98%  Weight:   87.4 kg   Height:        Intake/Output Summary (Last 24 hours) at 01/02/2021 1020 Last data filed at 01/02/2021 1003 Gross per 24 hour  Intake 1002.16 ml  Output 1675 ml  Net -672.84 ml   Filed Weights   12/31/20 1252 01/02/21 0542  Weight: 83.5 kg 87.4 kg    Examination:  General exam: Alert, awake, oriented x 3 Respiratory system: Clear to auscultation. Respiratory effort normal. Cardiovascular system: irregular. No murmurs, rubs, gallops. Gastrointestinal system: Abdomen is nondistended, soft and nontender. No organomegaly or masses felt. Normal bowel sounds heard. Central nervous system: Alert and oriented. She has a tremor in her jaw Extremities: 1+ edema bilaterally Skin: No rashes, lesions or ulcers Psychiatry: Judgement and insight appear normal. Mood & affect appropriate.      Data Reviewed: I have personally reviewed following labs and imaging studies  CBC: Recent Labs  Lab 12/27/20 1737 12/31/20 1506 01/01/21 0503 01/02/21 0451  WBC 6.7 12.3* 11.1* 10.2  NEUTROABS 3.4 7.1  --   --   HGB 10.1* 10.9* 10.1* 9.5*  HCT 34.9* 36.9 34.7* 33.1*  MCV 90.6 89.1 90.4 89.0  PLT 453* 602* 549* 852*   Basic Metabolic Panel: Recent Labs  Lab 12/27/20 1737 12/31/20 1333 01/01/21 0503 01/02/21 0451  NA 136 137 137 135  K 3.9 3.7 3.4* 3.8  CL 104 107 103 101  CO2 22 24 24 23   GLUCOSE 108* 99 90 105*  BUN 12 16 18 21   CREATININE 0.74 0.67 0.74 0.96  CALCIUM 7.9* 8.2* 8.4* 8.7*  MG 2.2 2.0  --  1.9   GFR: Estimated Creatinine Clearance: 48.5 mL/min (by C-G formula based on SCr of 0.96 mg/dL). Liver Function Tests: Recent Labs  Lab 12/27/20 1737 12/31/20 1333 01/01/21 0503  AST 29 28 22   ALT 11 12 10   ALKPHOS 69 79 74  BILITOT 0.3 0.8  0.6  PROT 6.3* 6.4* 6.1*  ALBUMIN 3.2* 3.0* 2.7*   No results for input(s): LIPASE, AMYLASE in the last 168 hours. No results for input(s): AMMONIA in the last 168 hours. Coagulation Profile: Recent Labs  Lab 12/27/20 1737 01/01/21 0503  INR 1.5* 1.9*   Cardiac Enzymes: No results for input(s): CKTOTAL, CKMB, CKMBINDEX, TROPONINI in the last 168 hours. BNP (last 3 results) Recent Labs    12/05/20 1636  PROBNP 277.0*   HbA1C: No results for input(s): HGBA1C in the last 72 hours. CBG: No results for input(s): GLUCAP in the last 168 hours. Lipid Profile: No results for input(s): CHOL, HDL, LDLCALC, TRIG, CHOLHDL, LDLDIRECT in the last  72 hours. Thyroid Function Tests: No results for input(s): TSH, T4TOTAL, FREET4, T3FREE, THYROIDAB in the last 72 hours. Anemia Panel: No results for input(s): VITAMINB12, FOLATE, FERRITIN, TIBC, IRON, RETICCTPCT in the last 72 hours. Sepsis Labs: Recent Labs  Lab 12/31/20 1334 12/31/20 1506 01/01/21 0503  PROCALCITON  --   --  <0.10  LATICACIDVEN 1.6 1.7  --     Recent Results (from the past 240 hour(s))  Resp Panel by RT-PCR (Flu A&B, Covid) Nasopharyngeal Swab     Status: None   Collection Time: 12/27/20  5:51 PM   Specimen: Nasopharyngeal Swab; Nasopharyngeal(NP) swabs in vial transport medium  Result Value Ref Range Status   SARS Coronavirus 2 by RT PCR NEGATIVE NEGATIVE Final    Comment: (NOTE) SARS-CoV-2 target nucleic acids are NOT DETECTED.  The SARS-CoV-2 RNA is generally detectable in upper respiratory specimens during the acute phase of infection. The lowest concentration of SARS-CoV-2 viral copies this assay can detect is 138 copies/mL. A negative result does not preclude SARS-Cov-2 infection and should not be used as the sole basis for treatment or other patient management decisions. A negative result may occur with  improper specimen collection/handling, submission of specimen other than nasopharyngeal swab, presence  of viral mutation(s) within the areas targeted by this assay, and inadequate number of viral copies(<138 copies/mL). A negative result must be combined with clinical observations, patient history, and epidemiological information. The expected result is Negative.  Fact Sheet for Patients:  EntrepreneurPulse.com.au  Fact Sheet for Healthcare Providers:  IncredibleEmployment.be  This test is no t yet approved or cleared by the [place] FDA and  has been authorized for detection and/or diagnosis of SARS-CoV-2 by FDA under an Emergency Use Authorization (EUA). This EUA will remain  in effect (meaning this test can be used) for the duration of the COVID-19 declaration under Section 564(b)(1) of the Act, 21 U.S.C.section 360bbb-3(b)(1), unless the authorization is terminated  or revoked sooner.       Influenza A by PCR NEGATIVE NEGATIVE Final   Influenza B by PCR NEGATIVE NEGATIVE Final    Comment: (NOTE) The Xpert Xpress SARS-CoV-2/FLU/RSV plus assay is intended as an aid in the diagnosis of influenza from Nasopharyngeal swab specimens and should not be used as a sole basis for treatment. Nasal washings and aspirates are unacceptable for Xpert Xpress SARS-CoV-2/FLU/RSV testing.  Fact Sheet for Patients: EntrepreneurPulse.com.au  Fact Sheet for Healthcare Providers: IncredibleEmployment.be  This test is not yet approved or cleared by the [place] FDA and has been authorized for detection and/or diagnosis of SARS-CoV-2 by FDA under an Emergency Use Authorization (EUA). This EUA will remain in effect (meaning this test can be used) for the duration of the COVID-19 declaration under Section 564(b)(1) of the Act, 21 U.S.C. section 360bbb-3(b)(1), unless the authorization is terminated or revoked.  Performed at [facility], [address] [place], [place] [address]   Resp Panel by RT-PCR (Flu A&B,  Covid) Nasopharyngeal Swab     Status: None   Collection Time: 12/31/20  1:26 PM   Specimen: Nasopharyngeal Swab; Nasopharyngeal(NP) swabs in vial transport medium  Result Value Ref Range Status   SARS Coronavirus 2 by RT PCR NEGATIVE NEGATIVE Final    Comment: (NOTE) SARS-CoV-2 target nucleic acids are NOT DETECTED.  The SARS-CoV-2 RNA is generally detectable in upper respiratory specimens during the acute phase of infection. The lowest concentration of SARS-CoV-2 viral copies this assay can detect is 138 copies/mL. A negative result does not preclude  SARS-Cov-2 infection and should not be used as the sole basis for treatment or other patient management decisions. A negative result may occur with  improper specimen collection/handling, submission of specimen other than nasopharyngeal swab, presence of viral mutation(s) within the areas targeted by this assay, and inadequate number of viral copies(<138 copies/mL). A negative result must be combined with clinical observations, patient history, and epidemiological information. The expected result is Negative.  Fact Sheet for Patients:  EntrepreneurPulse.com.au  Fact Sheet for Healthcare Providers:  IncredibleEmployment.be  This test is no t yet approved or cleared by the [place] FDA and  has been authorized for detection and/or diagnosis of SARS-CoV-2 by FDA under an Emergency Use Authorization (EUA). This EUA will remain  in effect (meaning this test can be used) for the duration of the COVID-19 declaration under Section 564(b)(1) of the Act, 21 U.S.C.section 360bbb-3(b)(1), unless the authorization is terminated  or revoked sooner.       Influenza A by PCR NEGATIVE NEGATIVE Final   Influenza B by PCR NEGATIVE NEGATIVE Final    Comment: (NOTE) The Xpert Xpress SARS-CoV-2/FLU/RSV plus assay is intended as an aid in the diagnosis of influenza from Nasopharyngeal swab specimens and should  not be used as a sole basis for treatment. Nasal washings and aspirates are unacceptable for Xpert Xpress SARS-CoV-2/FLU/RSV testing.  Fact Sheet for Patients: EntrepreneurPulse.com.au  Fact Sheet for Healthcare Providers: IncredibleEmployment.be  This test is not yet approved or cleared by the [place] FDA and has been authorized for detection and/or diagnosis of SARS-CoV-2 by FDA under an Emergency Use Authorization (EUA). This EUA will remain in effect (meaning this test can be used) for the duration of the COVID-19 declaration under Section 564(b)(1) of the Act, 21 U.S.C. section 360bbb-3(b)(1), unless the authorization is terminated or revoked.  Performed at [facility], [address], [place], [place] [address]   Blood culture (routine x 2)     Status: None (Preliminary result)   Collection Time: 12/31/20  1:43 PM   Specimen: Right Antecubital; Blood  Result Value Ref Range Status   Specimen Description RIGHT ANTECUBITAL  Final   Special Requests   Final    BOTTLES DRAWN AEROBIC AND ANAEROBIC Blood Culture results may not be optimal due to an inadequate volume of blood received in culture bottles   Culture   Final    NO GROWTH 2 DAYS Performed at [facility], [address], [place], [place] [address]    Report Status PENDING  Incomplete  Blood culture (routine x 2)     Status: None (Preliminary result)   Collection Time: 12/31/20  1:46 PM   Specimen: Left Antecubital; Blood  Result Value Ref Range Status   Specimen Description LEFT ANTECUBITAL  Final   Special Requests   Final    BOTTLES DRAWN AEROBIC AND ANAEROBIC Blood Culture adequate volume   Culture   Final    NO GROWTH 2 DAYS Performed at [facility], [address] [place], [place] [address]    Report Status PENDING  Incomplete  MRSA Next Gen by PCR, Nasal     Status: None   Collection Time: 12/31/20  4:29 PM   Specimen: Nasal Mucosa; Nasal Swab  Result Value Ref  Range Status   MRSA by PCR Next Gen NOT DETECTED NOT DETECTED Final    Comment: (NOTE) The GeneXpert MRSA Assay (FDA approved for NASAL specimens only), is one component of a comprehensive MRSA colonization surveillance program. It is not intended to  diagnose MRSA infection nor to guide or monitor treatment for MRSA infections. Test performance is not FDA approved in patients less than 67 years old. Performed at [facility], [address] [place], [place] [address]          Radiology Studies: CT Angio Chest PE W and/or Wo Contrast  Result Date: 12/31/2020 CLINICAL DATA:  Pulmonary embolus suspected. High probability. Shortness of breath. Atrial fibrillation. Hypoxia. EXAM: CT ANGIOGRAPHY CHEST WITH CONTRAST TECHNIQUE: Multidetector CT imaging of the chest was performed using the standard protocol during bolus administration of intravenous contrast. Multiplanar CT image reconstructions and MIPs were obtained to evaluate the vascular anatomy. CONTRAST:  62mL OMNIPAQUE IOHEXOL 350 MG/ML SOLN COMPARISON:  One-view chest x-ray 12/31/2020.  CTA chest 11/22/2020. FINDINGS: Cardiovascular: Heart is enlarged. There are calcifications are present. Pulmonary artery opacification is excellent. No focal filling defects are present to suggest pulmonary embolus. Pulmonary artery size is normal. Atherosclerotic calcifications are present in the aortic arch. Mediastinum/Nodes: Mild bilateral hilar adenopathy is again seen from right greater than left. No significant mediastinal or axillary adenopathy is present. Thyroid gland, trachea, and esophagus demonstrate no significant findings. Lungs/Pleura: Bilateral pleural effusions are present. Ground-glass attenuation is again noted bilaterally. No airway obstruction is present. Areas of dependent atelectasis are present bilaterally. Calcification is again noted along the minor fissure. 5 mm right middle lobe calcified nodule again seen. Upper Abdomen:  Cholecystectomy noted. Atherosclerotic changes are present within the aorta and branch vessels. Upper abdomen is otherwise unremarkable. Musculoskeletal: Exaggerated thoracic kyphosis again noted. No focal osseous lesions are present. Review of the MIP images confirms the above findings. IMPRESSION: 1. No evidence for pulmonary embolus. 2. Cardiomegaly with bilateral pleural effusions and ground-glass attenuation compatible with congestive heart failure and probable bilateral pneumonia. 3. Aortic Atherosclerosis (ICD10-I70.0). Electronically Signed   By: San Morelle M.D.   On: 12/31/2020 16:29   DG Chest Port 1 View  Result Date: 12/31/2020 CLINICAL DATA:  Shortness of breath EXAM: PORTABLE CHEST 1 VIEW COMPARISON:  12/27/2020 FINDINGS: The heart size and mediastinal contours are within normal limits. Mild, diffuse bilateral interstitial pulmonary opacity. The visualized skeletal structures are unremarkable. IMPRESSION: Mild, diffuse bilateral interstitial pulmonary opacity, which may reflect edema or infection. No focal airspace opacity. Electronically Signed   By: Delanna Ahmadi M.D.   On: 12/31/2020 14:37        Scheduled Meds:  acidophilus  1 capsule Oral Daily   apixaban  5 mg Oral BID   Chlorhexidine Gluconate Cloth  6 each Topical Daily   cholecalciferol  1,000 Units Oral Daily   docusate sodium  100 mg Oral BID   furosemide  80 mg Intravenous BID   gabapentin  200 mg Oral TID   melatonin  10 mg Oral QHS   midodrine  5 mg Oral TID WC   pantoprazole  40 mg Oral Daily   potassium chloride  40 mEq Oral Once   propranolol  40 mg Oral BID   sucralfate  1 g Oral QHS   Continuous Infusions:  amiodarone 30 mg/hr (01/02/21 0841)   cefTRIAXone (ROCEPHIN)  IV 1 g (01/02/21 0906)   diltiazem (CARDIZEM) infusion 5 mg/hr (01/02/21 0624)   magnesium sulfate bolus IVPB       LOS: 2 days    Time spent: 35 minutes    Kathie Dike, MD [facility]  If 7PM-7AM, please  contact night-coverage [email or web address] 01/02/2021, 10:20 AM

## 2021-01-02 NOTE — Progress Notes (Signed)
Progress Note  Patient Name: Monique Robles Date of Encounter: 01/02/2021  [facility] Cardiologist: Dorris Carnes, MD   Subjective   Some ongoing SOB  Inpatient Medications    Scheduled Meds:  acidophilus  1 capsule Oral Daily   apixaban  5 mg Oral BID   Chlorhexidine Gluconate Cloth  6 each Topical Daily   cholecalciferol  1,000 Units Oral Daily   docusate sodium  100 mg Oral BID   gabapentin  200 mg Oral TID   melatonin  10 mg Oral QHS   midodrine  5 mg Oral TID WC   pantoprazole  40 mg Oral Daily   propranolol  40 mg Oral BID   sucralfate  1 g Oral QHS   Continuous Infusions:  amiodarone 60 mg/hr (01/02/21 0624)   cefTRIAXone (ROCEPHIN)  IV Stopped (01/01/21 0948)   diltiazem (CARDIZEM) infusion 5 mg/hr (01/02/21 0624)   PRN Meds: acetaminophen **OR** acetaminophen, levalbuterol, Muscle Rub, ondansetron **OR** ondansetron (ZOFRAN) IV, senna-docusate   Vital Signs    Vitals:   01/02/21 0300 01/02/21 0400 01/02/21 0542 01/02/21 0734  BP: 112/81 107/73    Pulse: (!) 44 71  98  Resp: (!) 30 18  (!) 24  Temp:   97.6 F (36.4 C) (!) 97.3 F (36.3 C)  TempSrc:   Axillary Oral  SpO2: 99% 97%  98%  Weight:   87.4 kg   Height:        Intake/Output Summary (Last 24 hours) at 01/02/2021 0815 Last data filed at 01/02/2021 0400 Gross per 24 hour  Intake 1202.16 ml  Output 875 ml  Net 327.16 ml   Last 3 Weights 01/02/2021 12/31/2020 12/27/2020  Weight (lbs) 192 lb 10.9 oz 184 lb 1.4 oz 184 lb 3.2 oz  Weight (kg) 87.4 kg 83.5 kg 83.553 kg      Telemetry    Afib 80s to 110s - Personally Reviewed  ECG    N/a - Personally Reviewed  Physical Exam   GEN: No acute distress.   Neck: No JVD Cardiac: irreg Respiratory: bilateral crackles GI: Soft, nontender, non-distended  MS: No edema; No deformity. Neuro:  Nonfocal  Psych: Normal affect   Labs    High Sensitivity Troponin:   Recent Labs  Lab 12/31/20 1333 12/31/20 1506  TROPONINIHS 9 9      Chemistry Recent Labs  Lab 12/27/20 1737 12/31/20 1333 01/01/21 0503 01/02/21 0451  NA 136 137 137 135  K 3.9 3.7 3.4* 3.8  CL 104 107 103 101  CO2 22 24 24 23   GLUCOSE 108* 99 90 105*  BUN 12 16 18 21   CREATININE 0.74 0.67 0.74 0.96  CALCIUM 7.9* 8.2* 8.4* 8.7*  MG 2.2 2.0  --  1.9  PROT 6.3* 6.4* 6.1*  --   ALBUMIN 3.2* 3.0* 2.7*  --   AST 29 28 22   --   ALT 11 12 10   --   ALKPHOS 69 79 74  --   BILITOT 0.3 0.8 0.6  --   GFRNONAA >60 >60 >60 >60  ANIONGAP 10 6 10 11     Lipids No results for input(s): CHOL, TRIG, HDL, LABVLDL, LDLCALC, CHOLHDL in the last 168 hours.  Hematology Recent Labs  Lab 12/31/20 1506 01/01/21 0503 01/02/21 0451  WBC 12.3* 11.1* 10.2  RBC 4.14 3.84* 3.72*  HGB 10.9* 10.1* 9.5*  HCT 36.9 34.7* 33.1*  MCV 89.1 90.4 89.0  MCH 26.3 26.3 25.5*  MCHC 29.5* 29.1* 28.7*  RDW 16.8*  16.8* 16.7*  PLT 602* 549* 521*   Thyroid No results for input(s): TSH, FREET4 in the last 168 hours.  BNP Recent Labs  Lab 12/31/20 1334  BNP 556.0*    DDimer No results for input(s): DDIMER in the last 168 hours.   Radiology    CT Angio Chest PE W and/or Wo Contrast  Result Date: 12/31/2020 CLINICAL DATA:  Pulmonary embolus suspected. High probability. Shortness of breath. Atrial fibrillation. Hypoxia. EXAM: CT ANGIOGRAPHY CHEST WITH CONTRAST TECHNIQUE: Multidetector CT imaging of the chest was performed using the standard protocol during bolus administration of intravenous contrast. Multiplanar CT image reconstructions and MIPs were obtained to evaluate the vascular anatomy. CONTRAST:  90mL OMNIPAQUE IOHEXOL 350 MG/ML SOLN COMPARISON:  One-view chest x-ray 12/31/2020.  CTA chest 11/22/2020. FINDINGS: Cardiovascular: Heart is enlarged. There are calcifications are present. Pulmonary artery opacification is excellent. No focal filling defects are present to suggest pulmonary embolus. Pulmonary artery size is normal. Atherosclerotic calcifications are present in the  aortic arch. Mediastinum/Nodes: Mild bilateral hilar adenopathy is again seen from right greater than left. No significant mediastinal or axillary adenopathy is present. Thyroid gland, trachea, and esophagus demonstrate no significant findings. Lungs/Pleura: Bilateral pleural effusions are present. Ground-glass attenuation is again noted bilaterally. No airway obstruction is present. Areas of dependent atelectasis are present bilaterally. Calcification is again noted along the minor fissure. 5 mm right middle lobe calcified nodule again seen. Upper Abdomen: Cholecystectomy noted. Atherosclerotic changes are present within the aorta and [place] vessels. Upper abdomen is otherwise unremarkable. Musculoskeletal: Exaggerated thoracic kyphosis again noted. No focal osseous lesions are present. Review of the MIP images confirms the above findings. IMPRESSION: 1. No evidence for pulmonary embolus. 2. Cardiomegaly with bilateral pleural effusions and ground-glass attenuation compatible with congestive heart failure and probable bilateral pneumonia. 3. Aortic Atherosclerosis (ICD10-I70.0). Electronically Signed   By: San Morelle M.D.   On: 12/31/2020 16:29   DG Chest Port 1 View  Result Date: 12/31/2020 CLINICAL DATA:  Shortness of breath EXAM: PORTABLE CHEST 1 VIEW COMPARISON:  12/27/2020 FINDINGS: The heart size and mediastinal contours are within normal limits. Mild, diffuse bilateral interstitial pulmonary opacity. The visualized skeletal structures are unremarkable. IMPRESSION: Mild, diffuse bilateral interstitial pulmonary opacity, which may reflect edema or infection. No focal airspace opacity. Electronically Signed   By: Delanna Ahmadi M.D.   On: 12/31/2020 14:37    Cardiac Studies    Patient Profile     80 y.o. female   with a hx of PAF who is being seen 12/31/2020 for the evaluation of A. fib with RVR at the request of Dr. Verner Chol.   Assessment & Plan    1.PAF - admitted with afib with  RVR - she had DCCV during recent ER visit but had recurrent afib in setting of UTI - As outpatient difficultly rate controlling due to low bp's.   Seen in consultation by her primary cardiologist Dr Harrington Challenger on [place], started on IV amiodarone. From notes not optimal drug given pulmonary history  but thought to be best option at this time. We have continued  - currently on amio gtt, diltiazem drip at 5 mg/hr. Remains in afib, rates are improved 80s to 110s. Would anticipate repeat DCCV this admission once further diuresed and respiratory status improved if she does not self convert.    2.Acute on chronic diastolic HF - likely exacerbated by afib with RVR - BNP 556 on admit, CT with evidence of pulm edema vs infection - I/Os yesterday essentially  even, she received IV lasix 80mg  bid yesterday. REnal function remains normal - repeat IV lasix 80mg  bid today with a single dose of metolazone 2.5mg      For questions or updates, please contact [place] Please consult [email or web address] for contact info under        Signed, Carlyle Dolly, MD  01/02/2021, 8:15 AM

## 2021-01-03 ENCOUNTER — Inpatient Hospital Stay (HOSPITAL_COMMUNITY): Payer: Medicare Other

## 2021-01-03 ENCOUNTER — Ambulatory Visit: Payer: Medicare Other | Admitting: Family Medicine

## 2021-01-03 ENCOUNTER — Telehealth: Payer: Self-pay | Admitting: Cardiology

## 2021-01-03 DIAGNOSIS — J9611 Chronic respiratory failure with hypoxia: Secondary | ICD-10-CM | POA: Diagnosis not present

## 2021-01-03 DIAGNOSIS — T378X5S Adverse effect of other specified systemic anti-infectives and antiparasitics, sequela: Secondary | ICD-10-CM

## 2021-01-03 DIAGNOSIS — J9621 Acute and chronic respiratory failure with hypoxia: Secondary | ICD-10-CM

## 2021-01-03 DIAGNOSIS — J9601 Acute respiratory failure with hypoxia: Secondary | ICD-10-CM

## 2021-01-03 DIAGNOSIS — R0609 Other forms of dyspnea: Secondary | ICD-10-CM

## 2021-01-03 DIAGNOSIS — I4891 Unspecified atrial fibrillation: Secondary | ICD-10-CM | POA: Diagnosis not present

## 2021-01-03 DIAGNOSIS — T50901D Poisoning by unspecified drugs, medicaments and biological substances, accidental (unintentional), subsequent encounter: Secondary | ICD-10-CM

## 2021-01-03 DIAGNOSIS — J984 Other disorders of lung: Secondary | ICD-10-CM | POA: Diagnosis not present

## 2021-01-03 DIAGNOSIS — I5030 Unspecified diastolic (congestive) heart failure: Secondary | ICD-10-CM | POA: Diagnosis not present

## 2021-01-03 DIAGNOSIS — I5033 Acute on chronic diastolic (congestive) heart failure: Secondary | ICD-10-CM | POA: Diagnosis not present

## 2021-01-03 LAB — ECHOCARDIOGRAM LIMITED
Height: 61 in
S' Lateral: 3.3 cm
Weight: 3082.91 oz

## 2021-01-03 LAB — BASIC METABOLIC PANEL
Anion gap: 10 (ref 5–15)
BUN: 27 mg/dL — ABNORMAL HIGH (ref 8–23)
CO2: 27 mmol/L (ref 22–32)
Calcium: 8.8 mg/dL — ABNORMAL LOW (ref 8.9–10.3)
Chloride: 95 mmol/L — ABNORMAL LOW (ref 98–111)
Creatinine, Ser: 1.38 mg/dL — ABNORMAL HIGH (ref 0.44–1.00)
GFR, Estimated: 39 mL/min — ABNORMAL LOW (ref >60)
Glucose, Bld: 116 mg/dL — ABNORMAL HIGH (ref 70–99)
Potassium: 3.9 mmol/L (ref 3.5–5.1)
Sodium: 132 mmol/L — ABNORMAL LOW (ref 135–145)

## 2021-01-03 LAB — BRAIN NATRIURETIC PEPTIDE: B Natriuretic Peptide: 153 pg/mL — ABNORMAL HIGH (ref 0.0–100.0)

## 2021-01-03 LAB — MAGNESIUM: Magnesium: 2.2 mg/dL (ref 1.7–2.4)

## 2021-01-03 IMAGING — DX DG CHEST 1V PORT
1 series · 1 of 1 positions shown · non-contrast
Comparison: Chest x-ray [DATE]

CLINICAL DATA: Respiratory distress

EXAM:
PORTABLE CHEST 1 VIEW

[chest ap]
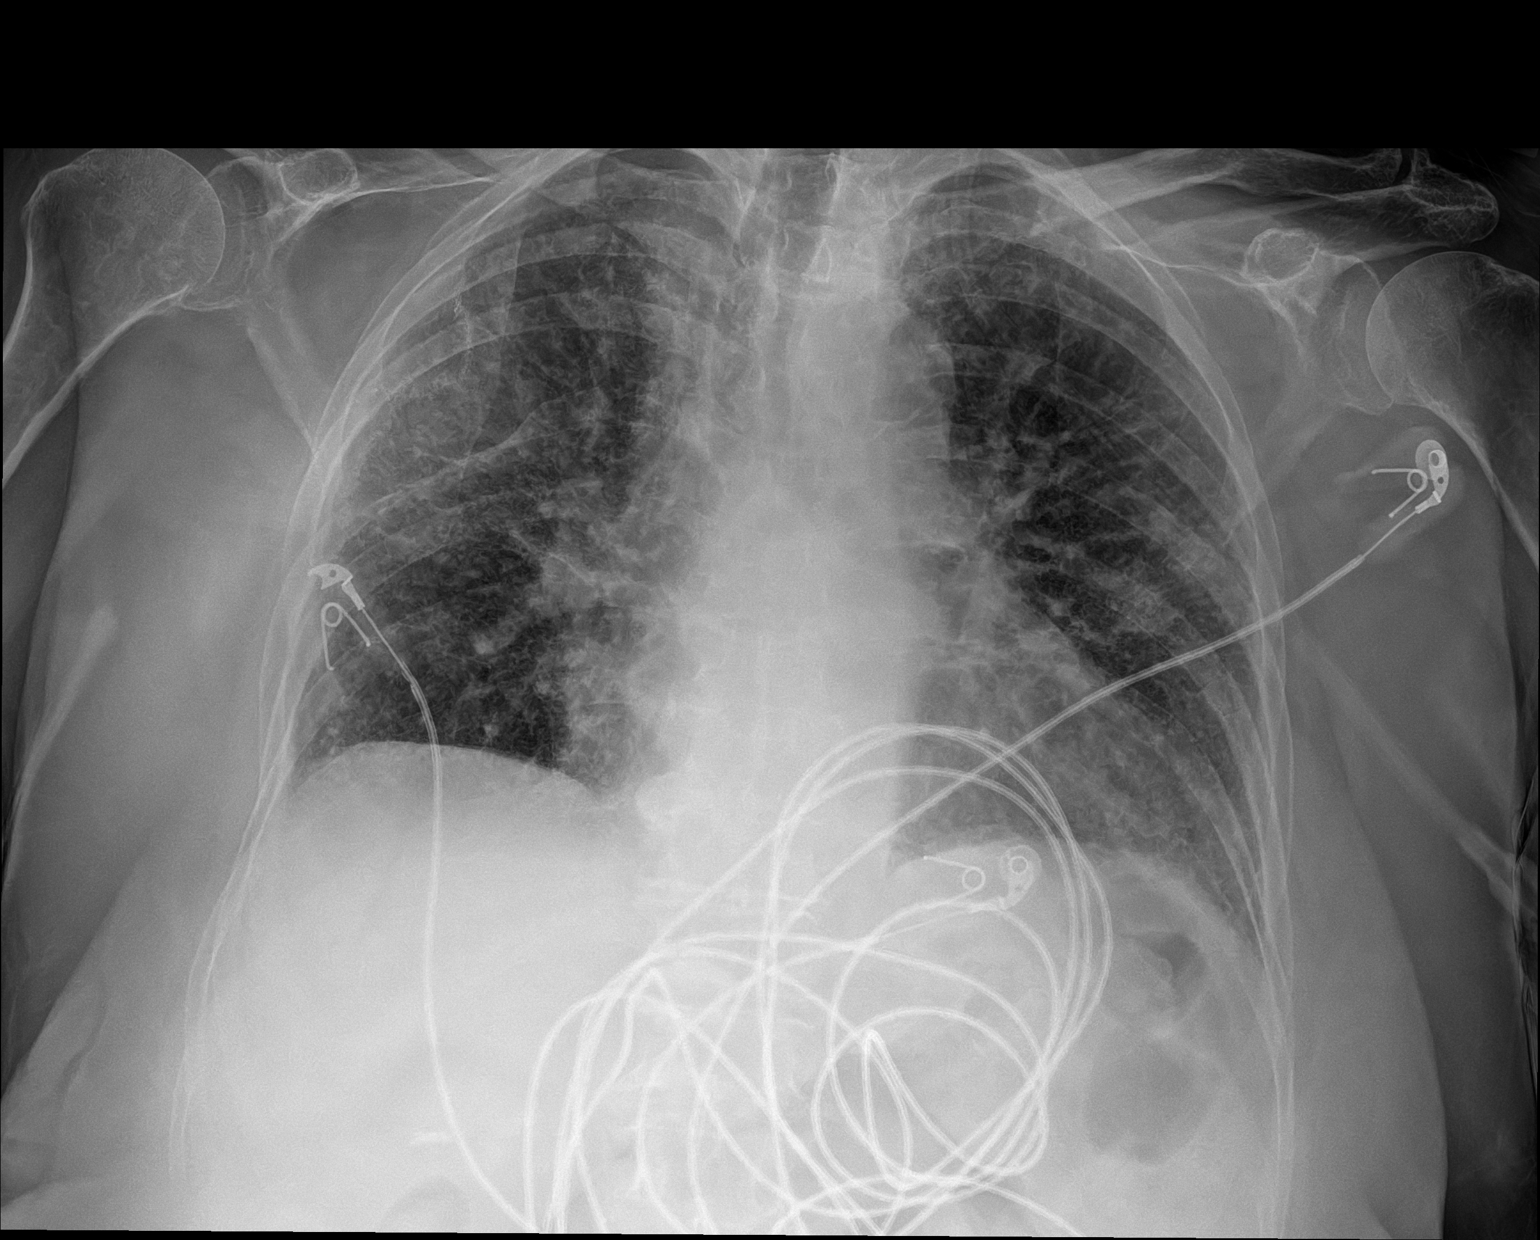

[1 of 1 positions shown; findings below may reference images not displayed]

FINDINGS: Cardiomegaly and mediastinum appear unchanged. Calcified plaques in
the aortic arch. Persistent prominent interstitial and patchy
irregular airspace opacities identified bilaterally, not
significantly changed since previous study. No new consolidation
appreciated. No significant pleural effusion. No pneumothorax.
IMPRESSION: No significant change since previous study.

## 2021-01-03 MED ORDER — SODIUM CHLORIDE 0.9 % IV BOLUS
500.0000 mL | Freq: Once | INTRAVENOUS | Status: AC
  Administered 2021-01-03: 500 mL via INTRAVENOUS

## 2021-01-03 NOTE — Telephone Encounter (Signed)
ERROR

## 2021-01-03 NOTE — Progress Notes (Signed)
*  PRELIMINARY RESULTS* Echocardiogram 2D Echocardiogram has been performed.  Monique Robles 01/03/2021, 4:15 PM

## 2021-01-03 NOTE — Plan of Care (Signed)

## 2021-01-03 NOTE — Progress Notes (Signed)
Progress Note  Patient Name: Monique Robles Date of Encounter: 01/03/2021  [facility] Cardiologist: Dorris Carnes, MD   Subjective   Ongoing SOB. Issues with low bp's in early AM.   Inpatient Medications    Scheduled Meds:  acidophilus  1 capsule Oral Daily   apixaban  5 mg Oral BID   Chlorhexidine Gluconate Cloth  6 each Topical Daily   cholecalciferol  1,000 Units Oral Daily   docusate sodium  100 mg Oral BID   gabapentin  200 mg Oral TID   melatonin  10 mg Oral QHS   midodrine  5 mg Oral TID WC   pantoprazole  40 mg Oral Daily   propranolol  40 mg Oral BID   sucralfate  1 g Oral QHS   Continuous Infusions:  amiodarone Stopped (01/03/21 9735)   cefTRIAXone (ROCEPHIN)  IV Stopped (01/02/21 0936)   diltiazem (CARDIZEM) infusion Stopped (01/03/21 0404)   PRN Meds: acetaminophen **OR** acetaminophen, cyclobenzaprine, HYDROcodone-acetaminophen, levalbuterol, Muscle Rub, ondansetron **OR** ondansetron (ZOFRAN) IV, senna-docusate   Vital Signs    Vitals:   01/03/21 0600 01/03/21 0615 01/03/21 0630 01/03/21 0645  BP: (!) 70/52 (!) 94/56 (!) 90/58 (!) 93/58  Pulse: 69 80 74 69  Resp: 15 (!) 21 (!) 21 20  Temp:      TempSrc:      SpO2: 100% 100% 100% 100%  Weight:      Height:        Intake/Output Summary (Last 24 hours) at 01/03/2021 0804 Last data filed at 01/03/2021 3299 Gross per 24 hour  Intake 1115.77 ml  Output 4150 ml  Net -3034.23 ml   Last 3 Weights 01/02/2021 12/31/2020 12/27/2020  Weight (lbs) 192 lb 10.9 oz 184 lb 1.4 oz 184 lb 3.2 oz  Weight (kg) 87.4 kg 83.5 kg 83.553 kg      Telemetry    Afib variable rates - Personally Reviewed  ECG    N/a - Personally Reviewed  Physical Exam   GEN: No acute distress.   Neck: No JVD Cardiac: irreg Respiratory: Clear to auscultation bilaterally. GI: Soft, nontender, non-distended  MS: No edema; No deformity. Neuro:  Nonfocal  Psych: Normal affect   Labs    High Sensitivity Troponin:    Recent Labs  Lab 12/31/20 1333 12/31/20 1506  TROPONINIHS 9 9     Chemistry Recent Labs  Lab 12/27/20 1737 12/31/20 1333 01/01/21 0503 01/02/21 0451 01/03/21 0427  NA 136 137 137 135 132*  K 3.9 3.7 3.4* 3.8 3.9  CL 104 107 103 101 95*  CO2 22 24 24 23 27   GLUCOSE 108* 99 90 105* 116*  BUN 12 16 18 21  27*  CREATININE 0.74 0.67 0.74 0.96 1.38*  CALCIUM 7.9* 8.2* 8.4* 8.7* 8.8*  MG 2.2 2.0  --  1.9 2.2  PROT 6.3* 6.4* 6.1*  --   --   ALBUMIN 3.2* 3.0* 2.7*  --   --   AST 29 28 22   --   --   ALT 11 12 10   --   --   ALKPHOS 69 79 74  --   --   BILITOT 0.3 0.8 0.6  --   --   GFRNONAA >60 >60 >60 >60 39*  ANIONGAP 10 6 10 11 10     Lipids No results for input(s): CHOL, TRIG, HDL, LABVLDL, LDLCALC, CHOLHDL in the last 168 hours.  Hematology Recent Labs  Lab 12/31/20 1506 01/01/21 0503 01/02/21 0451  WBC 12.3* 11.1*  10.2  RBC 4.14 3.84* 3.72*  HGB 10.9* 10.1* 9.5*  HCT 36.9 34.7* 33.1*  MCV 89.1 90.4 89.0  MCH 26.3 26.3 25.5*  MCHC 29.5* 29.1* 28.7*  RDW 16.8* 16.8* 16.7*  PLT 602* 549* 521*   Thyroid No results for input(s): TSH, FREET4 in the last 168 hours.  BNP Recent Labs  Lab 12/31/20 1334  BNP 556.0*    DDimer No results for input(s): DDIMER in the last 168 hours.   Radiology    No results found.  Cardiac Studies     Patient Profile     80 y.o. female   with a hx of PAF who is being seen 12/31/2020 for the evaluation of A. fib with RVR at the request of Dr. Verner Chol.   Assessment & Plan    1.PAF - admitted with afib with RVR - she had DCCV during recent ER visit but had recurrent afib in setting of UTI - As outpatient difficultly rate controlling due to low bp's.   Seen in consultation by her primary cardiologist Dr Harrington Challenger on [place], started on IV amiodarone. From notes not optimal drug given pulmonary history  but thought to be best option at this time. We have continued   - issues with low bp's last night, ulitmately amio and diltiazem  were stopped - rates 80s to 110s. SBP mid 90s and trending up. Pending trends could start back amio gtt vs dose of IV digoxin. Would not tolerate av nodal agent at this time based on bp's - possible repeat DCCV once respiratory status improved     2.Acute on chronic diastolic HF - likely exacerbated by afib with RVR - BNP 556 on admit, CT with evidence of pulm edema vs infection - received IV lasix 80mg  x 2 and metolazone 2.5mg  x 1 yesterday. Negative 2.8 L. Weights appear inaccurate. Signficant uptrend in Cr and BUN, hold diuretics today.   - she is overdiuresed and remains SOB, symptoms are not purely secondary to HF. - repeat CXR today. Limited echo given prior echo 09/2020 and has had persistent afib since that time, risk for tachy mediated CM      For questions or updates, please contact [place] Please consult [email or web address] for contact info under        Signed, Carlyle Dolly, MD  01/03/2021, 8:04 AM

## 2021-01-03 NOTE — Consult Note (Signed)
[facility]   Patient name: Monique Robles Admit date: 12/31/2020  DOB: 03/29/1940 LOS: 3  MRN: [id number] Consult date: 01/03/2021  Referring provider: Dr. Memon CC: Short of breath    History:  80 yo female with history of recurrent UTI's was on nitrofurantoin therapy from January through September 2022.  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  At the time she had changes on chest imaging consistent acute pneumonitis and more chronic fibrosis.  She was discharged home on supplemental oxygen and prednisone.  She had outpt follow up with Dr. Wert and had prednisone weaned off.    She presented to the ER on 12/31/20 with dyspnea.  Found to have worsening hypoxia with SpO2 74% on 5 liters and A fib with RVR (HR 150 to 180).  Found to have recurrent UTI.  Started on amiodarone and antibiotics.  She was treated with diuretics.  Heart rate improved and remained in negative fluid balance.  Still had dyspnea and PCCM asked to assess.  Past medical history:  A fib, Diastolic CHF, GERD, PE, Pneumonia, CVA, HTN, MVR, Osteoporosis, Tremor, Vit D deficiency, Overactive bladder  Significant events:  10/24 Admit  Studies:  CT chest 02/23/17 >> post operative changes Rt lung, 5 mm nodule RUL Echo 09/14/20 >> EF 65 to 70%, grade 1 DD, mild LA dilation, mild MR, mild AS CT angio chest 11/22/20 >> prominent LN, diffuse patchy b/l GGO air space disease, mild esophageal dilation CT angio chest 12/31/20 >> b/l patchy GGO, prominent LN, small b/l effusions  Micro:  COVID/Flu 10/24 >> negative MRSA PCR 10/24 >> negative Blood 10/24 >>  Urine 10/25 >> negative  Lines:     Antibiotics:  Cefepime 10/24 Vancomycin 10/24 Rocephin 10/25 >>   Consults:  Cardiology    Interim history:  Breathing better compared to admission.  Has dry cough.   Not having chest pain.  Leg swelling better.  On 2 liters O2 with SpO2 95 to 97% at rest.  Vital signs:  BP 98/65   Pulse 95   Temp (!) 97.4 F (36.3 C) (Oral)   Resp 19   Ht 5' 1" (1.549 m)   Wt 87.4 kg   SpO2 90%   BMI 36.41 kg/m   Intake/output:  I/O last 3 completed shifts: In: 2255.1 [P.O.:520; I.V.:1085.1; IV Piggyback:650] Out: 4700 [Urine:4700]   Physical exam:   General - alert Eyes - pupils reactive ENT - no sinus tenderness, no stridor Cardiac - irregular Chest - equal breath sounds b/l, no wheezing or rales Abdomen - soft, non tender, + bowel sounds Extremities - no cyanosis, clubbing, or edema Skin - no rashes Neuro - normal strength, moves extremities, follows commands Psych - normal mood and behavior  Best practice:   DVT - Eliquis SUP - Protonix Nutrition - Carb modified Mobility - OOB   Assessment/plan:   Acute on chronic hypoxic respiratory failure. 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 Acute on chronic diastolic   CHF. - amiodarone, cardizem per cardiology - goal even fluid balance at this time - pleural effusions improved on CXR 10/27 >> likely transudate in setting of CHF exacerbation - continue eliquis  Recurrent UTI. - ABx per primary team  Deconditioning. - likely contributing to dyspnea - would benefit from PT when clinically stable  Resolved hospital problems:    Goals of care/Family discussions:  Code status: DNR  Spoke with pt's daughter over the phone and updated about  treatment plan.  Labs:   CMP Latest Ref Rng & Units 01/03/2021 01/02/2021 01/01/2021  Glucose 70 - 99 mg/dL 116(H) 105(H) 90  BUN 8 - 23 mg/dL 27(H) 21 18  Creatinine 0.44 - 1.00 mg/dL 1.38(H) 0.96 0.74  Sodium 135 - 145 mmol/L 132(L) 135 137  Potassium 3.5 - 5.1 mmol/L 3.9 3.8 3.4(L)  Chloride 98 - 111 mmol/L 95(L) 101 103  CO2 22 - 32 mmol/L 27 23 24  Calcium 8.9 - 10.3 mg/dL 8.8(L) 8.7(L) 8.4(L)  Total Protein 6.5 - 8.1 g/dL - - 6.1(L)  Total Bilirubin 0.3 - 1.2 mg/dL - - 0.6  Alkaline Phos 38 - 126 U/L - - 74  AST 15 - 41 U/L - - 22  ALT 0 - 44 U/L - - 10    CBC Latest Ref Rng & Units 01/02/2021 01/01/2021 12/31/2020  WBC 4.0 - 10.5 K/uL 10.2 11.1(H) 12.3(H)  Hemoglobin 12.0 - 15.0 g/dL 9.5(L) 10.1(L) 10.9(L)  Hematocrit 36.0 - 46.0 % 33.1(L) 34.7(L) 36.9  Platelets 150 - 400 K/uL 521(H) 549(H) 602(H)    ABG    Component Value Date/Time   PHART 7.452 (H) 11/24/2020 0905   PCO2ART 36.3 11/24/2020 0905   PO2ART 57.6 (L) 11/24/2020 0905   HCO3 24.0 12/31/2020 1325   ACIDBASEDEF 3.2 (H) 11/23/2020 0945   O2SAT 45.1 12/31/2020 1325    CBG (last 3)  No results for input(s): GLUCAP in the last 72 hours.   Past surgical history:  She  has a past surgical history that includes Bladder surgery; Cholecystectomy; Tonsillectomy; RIGHT LUNG WEDGE REMOVAL; Colonoscopy; Colonoscopy with esophagogastroduodenoscopy (egd); Replacement total knee (Left); Cervical spine surgery; Lumbar spine surgery; Esophagogastroduodenoscopy (egd) with propofol (N/A, 11/28/2020); and Balloon dilation (N/A, 11/28/2020).  Social history:  She  reports that she has never smoked. She has never used smokeless tobacco. She reports that she does not currently use alcohol. She reports that she does not use drugs.   Review of systems:  Reviewed and negative  Family history:  Her family history includes Alzheimer's disease in her mother; Cancer in her mother; Diabetes in her father; Heart attack in her father;  Heart disease in her father; Pneumonia in her father; Tremor in her mother.    Medications:   No current facility-administered medications on file prior to encounter.   Current Outpatient Medications on File Prior to Encounter  Medication Sig   Acetaminophen (TYLENOL ARTHRITIS PAIN PO) Take 650 mg by mouth in the morning, at noon, and at bedtime.   apixaban (ELIQUIS) 5 MG TABS tablet Take 5 mg by mouth 2 (two) times daily.   calcium carbonate (TUMS - DOSED IN MG ELEMENTAL CALCIUM) 500 MG chewable tablet Chew 1 tablet by mouth daily as needed for indigestion or heartburn.   cholecalciferol (VITAMIN D3) 25 MCG (1000 UNIT) tablet Take 1,000 Units by mouth daily.   cyclobenzaprine (FLEXERIL) 5 MG tablet Take 5 mg by mouth daily as needed.   diphenhydrAMINE (BENADRYL) 25 MG tablet Take 25 mg by mouth daily   as needed for allergies.   furosemide (LASIX) 20 MG tablet Take 20 mg by mouth daily.   gabapentin (NEURONTIN) 100 MG capsule Take 200 mg by mouth 3 (three) times daily.   Lactobacillus TABS Take 1 tablet by mouth with breakfast, with lunch, and with evening meal.   levalbuterol (XOPENEX) 1.25 MG/0.5ML nebulizer solution Take 1.25 mg by nebulization every 6 (six) hours as needed for wheezing or shortness of breath.   Melatonin 10 MG TABS Take 10 mg by mouth at bedtime.   midodrine (PROAMATINE) 5 MG tablet Take 5 mg by mouth 3 (three) times daily with meals.   Multiple Vitamin (MULTIVITAMIN) tablet Take 1 tablet by mouth daily.   omeprazole (PRILOSEC) 40 MG capsule Take 1 capsule by mouth daily.   propranolol (INDERAL) 40 MG tablet Take 1 tablet (40 mg total) by mouth 2 (two) times daily.   senna-docusate (SENOKOT-S) 8.6-50 MG tablet Take 2 tablets by mouth at bedtime as needed for moderate constipation.   sucralfate (CARAFATE) 1 g tablet Take 1 g by mouth at bedtime.   Vibegron (GEMTESA) 75 MG TABS Take 1 capsule by mouth daily.   HYDROcodone-acetaminophen (NORCO/VICODIN) 5-325 MG tablet Take  1 tablet by mouth 2 (two) times daily as needed for moderate pain. (Patient not taking: No sig reported)   lactobacillus (FLORANEX/LACTINEX) PACK Take 1 packet (1 g total) by mouth 3 (three) times daily with meals. (Patient not taking: Reported on 12/31/2020)     Signature:   Chesley Mires, MD [place] Pager - [phone] 01/03/2021, 3:36 PM

## 2021-01-03 NOTE — Progress Notes (Addendum)
PROGRESS NOTE    Monique Robles  YOV:[id number] DOB: 1940/03/20 DOA: 12/31/2020 PCP: Celene Squibb, MD   Brief Narrative:   Monique Robles is a 80 y.o. female with PMH significant of paroxysmal A. fib on Eliquis, diastolic CHF with the last LVEF 65 to 70%, GERD, history of pulmonary embolism, history of stroke, hypertension, hyperlipidemia, vitamin D deficiency, essential tremor presented to the ED with worsening shortness of breath.  Patient was seen in the ED last week and found to have A. fib with RVR.  Patient underwent successful cardioversion into normal sinus rhythm and patient was discharged to rehab.  Patient has been admitted with recurrent atrial fibrillation with RVR with associated acute on chronic hypoxemic respiratory failure in the setting of CHF. She also has a UTI.  Assessment & Plan:   Principal Problem:   Severe sepsis ([place]) Active Problems:   Essential tremor   Overactive bladder   GERD (gastroesophageal reflux disease)   Insomnia   Iron deficiency anemia due to chronic blood loss   Chronic lung disease   Chronic respiratory failure with hypoxia (HCC)   Recurrent A. fib with RVR: Patient presented with increasing shortness of breath associated with palpitations. She was found to have A. fib with RVR, heart rate 140- 146. Restarted home propranolol, and started on amiodarone infusion as well as Cardizem infusion Continue Eliquis. Last echocardiogram EF 65 to 70%.  Mild aortic stenosis. -Amiodarone/Cardizem held on 10/26 and she became hypotensive, possibly related to overdiuresis -Heart rate is trending back up and she has been restarted on IV amiodarone -Can consider digoxin load if heart rate sustains at elevated rate   Acute on chronic hypoxic respiratory failure  Patient was recently discharged to rehab on 2 L of supplemental oxygen. She remained on 2 L until oxygen had to be titrated up to NRB on 10/25 Oxygen has since been weaned down to  4L Supplemental oxygen to keep saturation above 90%. -Etiology of respiratory failure felt to be multifactorial, related to CHF, atrial fibrillation, pulmonary fibrosis  Acute on chronic diastolic congestive heart failure -Cardiology following -She is being diuresed with IV Lasix -Overall net volume status has been negative -Holding further diuretics today since creatinine has bumped up  Pulmonary fibrosis related to nitrofurantoin -Patchy findings on chest imaging worrisome for underlying infection versus ILD -Appreciate pulmonology input -Currently on ceftriaxone to cover for any element of infection -Checking ESR, if elevated would consider restarting steroids    UTI Discontinued cefepime and vancomycin, switched to Rocephin Urine culture did not show any growth Procalcitonin low  Hypokalemia Replaced   Pleural effusion: CT chest shows bilateral pleural effusion It does not seem she needs thoracocentesis. -Overall effusions have improved with diuresis   Orthostatic hypotension: Continue midodrine.   Essential tremor: Continue on propranolol  AKI -Likely related to diuresis and hypotension -Continue to follow creatinine   DVT prophylaxis:Eliquis Code Status: DNR Family Communication: Discussed with daughter at bedside on 10/27 Disposition Plan:  Status is: Inpatient  Remains inpatient appropriate because: Needs IV medications.  Consultants:  Cardiology Pulmonology  Procedures:  See below  Antimicrobials:  Anti-infectives (From admission, onward)    Start     Dose/Rate Route Frequency Ordered Stop   01/01/21 1600  vancomycin (VANCOCIN) IVPB 1000 mg/200 mL premix  Status:  Discontinued        1,000 mg 200 mL/hr over 60 Minutes Intravenous Every 24 hours 12/31/20 1503 01/01/21 0752   01/01/21 1000  cefTRIAXone (ROCEPHIN) 1 g  in sodium chloride 0.9 % 100 mL IVPB        1 g 200 mL/hr over 30 Minutes Intravenous Every 24 hours 01/01/21 0752     12/31/20  2200  ceFEPIme (MAXIPIME) 2 g in sodium chloride 0.9 % 100 mL IVPB  Status:  Discontinued        2 g 200 mL/hr over 30 Minutes Intravenous Every 12 hours 12/31/20 1728 01/01/21 0752   12/31/20 1515  ceFEPIme (MAXIPIME) 2 g in sodium chloride 0.9 % 100 mL IVPB        2 g 200 mL/hr over 30 Minutes Intravenous  Once 12/31/20 1500 12/31/20 1538   12/31/20 1515  vancomycin (VANCOREADY) IVPB 1750 mg/350 mL        1,750 mg 175 mL/hr over 120 Minutes Intravenous  Once 12/31/20 1501 12/31/20 1749   12/31/20 1500  ceFEPIme (MAXIPIME) 1 g in sodium chloride 0.9 % 100 mL IVPB  Status:  Discontinued        1 g 200 mL/hr over 30 Minutes Intravenous  Once 12/31/20 1456 12/31/20 1500      Subjective: She had some nausea overnight.  Has nonproductive cough.  Did feel short of breath this morning.  Objective: Vitals:   01/03/21 1400 01/03/21 1500 01/03/21 1600 01/03/21 1625  BP:  (!) 102/57 113/60   Pulse: (!) 115 (!) 122 (!) 103 (!) 113  Resp: 20 (!) 21 17 (!) 22  Temp:    97.7 F (36.5 C)  TempSrc:    Oral  SpO2: 93% 93% 99% 96%  Weight:      Height:        Intake/Output Summary (Last 24 hours) at 01/03/2021 1705 Last data filed at 01/03/2021 1600 Gross per 24 hour  Intake 1420.62 ml  Output 2150 ml  Net -729.38 ml   Filed Weights   12/31/20 1252 01/02/21 0542  Weight: 83.5 kg 87.4 kg    Examination:  General exam: Alert, awake, oriented x 3 Respiratory system: Clear to auscultation. Respiratory effort normal. Cardiovascular system: Irregular.  No murmurs, rubs, gallops. Gastrointestinal system: Abdomen is nondistended, soft and nontender. No organomegaly or masses felt. Normal bowel sounds heard. Central nervous system: Alert and oriented. No focal neurological deficits. Extremities: No C/C/E, +pedal pulses Skin: No rashes, lesions or ulcers Psychiatry: Judgement and insight appear normal. Mood & affect appropriate.       Data Reviewed: I have personally reviewed following  labs and imaging studies  CBC: Recent Labs  Lab 12/27/20 1737 12/31/20 1506 01/01/21 0503 01/02/21 0451  WBC 6.7 12.3* 11.1* 10.2  NEUTROABS 3.4 7.1  --   --   HGB 10.1* 10.9* 10.1* 9.5*  HCT 34.9* 36.9 34.7* 33.1*  MCV 90.6 89.1 90.4 89.0  PLT 453* 602* 549* 771*   Basic Metabolic Panel: Recent Labs  Lab 12/27/20 1737 12/31/20 1333 01/01/21 0503 01/02/21 0451 01/03/21 0427  NA 136 137 137 135 132*  K 3.9 3.7 3.4* 3.8 3.9  CL 104 107 103 101 95*  CO2 _0 GLUCOSE 108* 99 90 105* 116*  BUN _1 27*  CREATININE 0.74 0.67 0.74 0.96 1.38*  CALCIUM 7.9* 8.2* 8.4* 8.7* 8.8*  MG 2.2 2.0  --  1.9 2.2   GFR: Estimated Creatinine Clearance: 33.7 mL/min (A) (by C-G formula based on SCr of 1.38 mg/dL (H)). Liver Function Tests: Recent Labs  Lab 12/27/20 1737 12/31/20 1333 01/01/21 0503  AST _2 ALT  _0 ALKPHOS 69 79 74  BILITOT 0.3 0.8 0.6  PROT 6.3* 6.4* 6.1*  ALBUMIN 3.2* 3.0* 2.7*   No results for input(s): LIPASE, AMYLASE in the last 168 hours. No results for input(s): AMMONIA in the last 168 hours. Coagulation Profile: Recent Labs  Lab 12/27/20 1737 01/01/21 0503  INR 1.5* 1.9*   Cardiac Enzymes: No results for input(s): CKTOTAL, CKMB, CKMBINDEX, TROPONINI in the last 168 hours. BNP (last 3 results) Recent Labs    12/05/20 1636  PROBNP 277.0*   HbA1C: No results for input(s): HGBA1C in the last 72 hours. CBG: No results for input(s): GLUCAP in the last 168 hours. Lipid Profile: No results for input(s): CHOL, HDL, LDLCALC, TRIG, CHOLHDL, LDLDIRECT in the last 72 hours. Thyroid Function Tests: No results for input(s): TSH, T4TOTAL, FREET4, T3FREE, THYROIDAB in the last 72 hours. Anemia Panel: No results for input(s): VITAMINB12, FOLATE, FERRITIN, TIBC, IRON, RETICCTPCT in the last 72 hours. Sepsis Labs: Recent Labs  Lab 12/31/20 1334 12/31/20 1506 01/01/21 0503  PROCALCITON  --   --  <0.10  LATICACIDVEN 1.6 1.7   --     Recent Results (from the past 240 hour(s))  Resp Panel by RT-PCR (Flu A&B, Covid) Nasopharyngeal Swab     Status: None   Collection Time: 12/27/20  5:51 PM   Specimen: Nasopharyngeal Swab; Nasopharyngeal(NP) swabs in vial transport medium  Result Value Ref Range Status   SARS Coronavirus 2 by RT PCR NEGATIVE NEGATIVE Final    Comment: (NOTE) SARS-CoV-2 target nucleic acids are NOT DETECTED.  The SARS-CoV-2 RNA is generally detectable in upper respiratory specimens during the acute phase of infection. The lowest concentration of SARS-CoV-2 viral copies this assay can detect is 138 copies/mL. A negative result does not preclude SARS-Cov-2 infection and should not be used as the sole basis for treatment or other patient management decisions. A negative result may occur with  improper specimen collection/handling, submission of specimen other than nasopharyngeal swab, presence of viral mutation(s) within the areas targeted by this assay, and inadequate number of viral copies(<138 copies/mL). A negative result must be combined with clinical observations, patient history, and epidemiological information. The expected result is Negative.  Fact Sheet for Patients:  EntrepreneurPulse.com.au  Fact Sheet for Healthcare Providers:  IncredibleEmployment.be  This test is no t yet approved or cleared by the [place] FDA and  has been authorized for detection and/or diagnosis of SARS-CoV-2 by FDA under an Emergency Use Authorization (EUA). This EUA will remain  in effect (meaning this test can be used) for the duration of the COVID-19 declaration under Section 564(b)(1) of the Act, 21 U.S.C.section 360bbb-3(b)(1), unless the authorization is terminated  or revoked sooner.       Influenza A by PCR NEGATIVE NEGATIVE Final   Influenza B by PCR NEGATIVE NEGATIVE Final    Comment: (NOTE) The Xpert Xpress SARS-CoV-2/FLU/RSV plus assay is intended  as an aid in the diagnosis of influenza from Nasopharyngeal swab specimens and should not be used as a sole basis for treatment. Nasal washings and aspirates are unacceptable for Xpert Xpress SARS-CoV-2/FLU/RSV testing.  Fact Sheet for Patients: EntrepreneurPulse.com.au  Fact Sheet for Healthcare Providers: IncredibleEmployment.be  This test is not yet approved or cleared by the [place] FDA and has been authorized for detection and/or diagnosis of SARS-CoV-2 by FDA under an Emergency Use Authorization (EUA). This EUA will remain in effect (meaning this test can be used) for the duration of the COVID-19 declaration under  Section 564(b)(1) of the Act, 21 U.S.C. section 360bbb-3(b)(1), unless the authorization is terminated or revoked.  Performed at [facility], [address] [place], [place] [address]   Resp Panel by RT-PCR (Flu A&B, Covid) Nasopharyngeal Swab     Status: None   Collection Time: 12/31/20  1:26 PM   Specimen: Nasopharyngeal Swab; Nasopharyngeal(NP) swabs in vial transport medium  Result Value Ref Range Status   SARS Coronavirus 2 by RT PCR NEGATIVE NEGATIVE Final    Comment: (NOTE) SARS-CoV-2 target nucleic acids are NOT DETECTED.  The SARS-CoV-2 RNA is generally detectable in upper respiratory specimens during the acute phase of infection. The lowest concentration of SARS-CoV-2 viral copies this assay can detect is 138 copies/mL. A negative result does not preclude SARS-Cov-2 infection and should not be used as the sole basis for treatment or other patient management decisions. A negative result may occur with  improper specimen collection/handling, submission of specimen other than nasopharyngeal swab, presence of viral mutation(s) within the areas targeted by this assay, and inadequate number of viral copies(<138 copies/mL). A negative result must be combined with clinical observations, patient history, and  epidemiological information. The expected result is Negative.  Fact Sheet for Patients:  EntrepreneurPulse.com.au  Fact Sheet for Healthcare Providers:  IncredibleEmployment.be  This test is no t yet approved or cleared by the [place] FDA and  has been authorized for detection and/or diagnosis of SARS-CoV-2 by FDA under an Emergency Use Authorization (EUA). This EUA will remain  in effect (meaning this test can be used) for the duration of the COVID-19 declaration under Section 564(b)(1) of the Act, 21 U.S.C.section 360bbb-3(b)(1), unless the authorization is terminated  or revoked sooner.       Influenza A by PCR NEGATIVE NEGATIVE Final   Influenza B by PCR NEGATIVE NEGATIVE Final    Comment: (NOTE) The Xpert Xpress SARS-CoV-2/FLU/RSV plus assay is intended as an aid in the diagnosis of influenza from Nasopharyngeal swab specimens and should not be used as a sole basis for treatment. Nasal washings and aspirates are unacceptable for Xpert Xpress SARS-CoV-2/FLU/RSV testing.  Fact Sheet for Patients: EntrepreneurPulse.com.au  Fact Sheet for Healthcare Providers: IncredibleEmployment.be  This test is not yet approved or cleared by the [place] FDA and has been authorized for detection and/or diagnosis of SARS-CoV-2 by FDA under an Emergency Use Authorization (EUA). This EUA will remain in effect (meaning this test can be used) for the duration of the COVID-19 declaration under Section 564(b)(1) of the Act, 21 U.S.C. section 360bbb-3(b)(1), unless the authorization is terminated or revoked.  Performed at [facility], [address] [place], [place] [address]   Blood culture (routine x 2)     Status: None (Preliminary result)   Collection Time: 12/31/20  1:43 PM   Specimen: Right Antecubital; Blood  Result Value Ref Range Status   Specimen Description RIGHT ANTECUBITAL  Final   Special  Requests   Final    BOTTLES DRAWN AEROBIC AND ANAEROBIC Blood Culture results may not be optimal due to an inadequate volume of blood received in culture bottles   Culture   Final    NO GROWTH 3 DAYS Performed at [facility], [address] [place], [place] [address]    Report Status PENDING  Incomplete  Blood culture (routine x 2)     Status: None (Preliminary result)   Collection Time: 12/31/20  1:46 PM   Specimen: Left Antecubital; Blood  Result Value Ref Range Status   Specimen Description LEFT  ANTECUBITAL  Final   Special Requests   Final    BOTTLES DRAWN AEROBIC AND ANAEROBIC Blood Culture adequate volume   Culture   Final    NO GROWTH 3 DAYS Performed at [facility], [address] [place], [place] [address]    Report Status PENDING  Incomplete  MRSA Next Gen by PCR, Nasal     Status: None   Collection Time: 12/31/20  4:29 PM   Specimen: Nasal Mucosa; Nasal Swab  Result Value Ref Range Status   MRSA by PCR Next Gen NOT DETECTED NOT DETECTED Final    Comment: (NOTE) The GeneXpert MRSA Assay (FDA approved for NASAL specimens only), is one component of a comprehensive MRSA colonization surveillance program. It is not intended to diagnose MRSA infection nor to guide or monitor treatment for MRSA infections. Test performance is not FDA approved in patients less than 55 years old. Performed at [facility], [address], [place], [place] [address]   Urine Culture     Status: None   Collection Time: 01/01/21 10:15 AM   Specimen: Urine, Clean Catch  Result Value Ref Range Status   Specimen Description   Final    URINE, CLEAN CATCH Performed at [facility], [address] [place], [place] [address]    Special Requests   Final    NONE Performed at [facility], [address], [place], [place] [address]    Culture   Final    NO GROWTH Performed at [facility], [place] [address], [place], [place] [address]    Report Status 01/02/2021 FINAL  Final          Radiology Studies: DG Chest Port 1 View  Result Date: 01/03/2021 CLINICAL DATA:  Respiratory distress EXAM: PORTABLE CHEST 1 VIEW COMPARISON:  Chest x-ray 12/31/2020 FINDINGS: Cardiomegaly and mediastinum appear unchanged. Calcified plaques in the aortic arch. Persistent prominent interstitial and patchy irregular airspace opacities identified bilaterally, not significantly changed since previous study. No new consolidation appreciated. No significant pleural effusion. No pneumothorax. IMPRESSION: No significant change since previous study. Electronically Signed   By: Ofilia Neas M.D.   On: 01/03/2021 13:20   ECHOCARDIOGRAM LIMITED  Result Date: 01/03/2021    ECHOCARDIOGRAM LIMITED REPORT   Patient Name:   SHERLE MELLO Date of Exam: 01/03/2021 Medical Rec #:  [id number]        Height:       61.0 in Accession #:    [id number]       Weight:       192.7 lb Date of Birth:  [birth year]        BSA:          1.859 m Patient Age:    80 years         BP:           113/60 mmHg Patient Gender: F                HR:           105 bpm. Exam Location:  [facility] Procedure: Limited Echo Indications:    Dyspnea  History:        Patient has prior history of Echocardiogram examinations, most                 recent 09/13/2020. Stroke, Signs/Symptoms:Dyspnea; Risk                 Factors:Hypertension and Dyslipidemia. Sepsis.  Sonographer:    Wenda Low  Referring Phys: [id number] [place]  1. Left ventricular ejection fraction, by estimation, is 55 to 60%. The left ventricle has normal function. Left ventricular diastolic parameters are indeterminate.  2. Right ventricular systolic function is normal. The right ventricular size is normal.  3. IVC is small suggesting low RA pressure and hypovolemia. FINDINGS  Left Ventricle: Left ventricular ejection fraction, by estimation, is 55 to 60%. The left ventricle has normal function. Left ventricular diastolic parameters are indeterminate.  Right Ventricle: The right ventricular size is normal. No increase in right ventricular wall thickness. Right ventricular systolic function is normal. Venous: IVC is small suggesting low RA pressure and hypovolemia. LEFT VENTRICLE PLAX 2D LVIDd:         4.90 cm LVIDs:         3.30 cm LV PW:         0.70 cm LV IVS:        1.10 cm LVOT diam:     1.80 cm LVOT Area:     2.54 cm  LEFT ATRIUM         Index LA diam:    5.10 cm 2.74 cm/m   AORTA Ao Root diam: 2.90 cm Ao Asc diam:  3.20 cm  SHUNTS Systemic Diam: 1.80 cm Carlyle Dolly MD Electronically signed by Carlyle Dolly MD Signature Date/Time: 01/03/2021/4:29:18 PM    Final         Scheduled Meds:  acidophilus  1 capsule Oral Daily   apixaban  5 mg Oral BID   Chlorhexidine Gluconate Cloth  6 each Topical Daily   cholecalciferol  1,000 Units Oral Daily   docusate sodium  100 mg Oral BID   gabapentin  200 mg Oral TID   melatonin  10 mg Oral QHS   midodrine  5 mg Oral TID WC   pantoprazole  40 mg Oral Daily   sucralfate  1 g Oral QHS   Continuous Infusions:  amiodarone 30 mg/hr (01/03/21 1510)   cefTRIAXone (ROCEPHIN)  IV Stopped (01/03/21 0935)   diltiazem (CARDIZEM) infusion Stopped (01/03/21 0404)     LOS: 3 days    Time spent: 35 minutes    Kathie Dike, MD [facility]  If 7PM-7AM, please contact night-coverage [email or web address] 01/03/2021, 5:05 PM

## 2021-01-03 NOTE — Progress Notes (Incomplete)
Echocardiogram was attempted but patient was eating will try again later.  Dustin Flock, RCS

## 2021-01-03 NOTE — Progress Notes (Addendum)
0410:  At 0400, pt O2 sat dropped to 85% and SBP read in the 40's, rechecked and was in the 50's.  Pt was difficult to arouse at first.  Cardizem and amiodarone stopped while charge nurse called doctor.  Pt became nauseated too.  Had pt smell alcohol wipe while another nurse got zofran.  Zofran given, see MAR.  Received orders to stop cardizem, keep amiodarone infusing and give a NSS 536ml bolus over an hour and do a BP manual.   0415:  Manual BP was 96/62.  Stayed with pt during this period.   8241:  Notified doctor of BP dropping to 70/52.  Received order for another 500 ml bolus of NSS and to stop amiodarone.

## 2021-01-04 ENCOUNTER — Ambulatory Visit: Payer: Medicare Other | Admitting: Internal Medicine

## 2021-01-04 ENCOUNTER — Inpatient Hospital Stay (HOSPITAL_COMMUNITY): Payer: Medicare Other | Admitting: Anesthesiology

## 2021-01-04 ENCOUNTER — Encounter (HOSPITAL_COMMUNITY)
Admission: EM | Disposition: A | Discharge status: Skilled Nursing Facility | Payer: Self-pay | Source: Skilled Nursing Facility | Attending: Internal Medicine

## 2021-01-04 DIAGNOSIS — Z66 Do not resuscitate: Secondary | ICD-10-CM | POA: Diagnosis not present

## 2021-01-04 DIAGNOSIS — T50905A Adverse effect of unspecified drugs, medicaments and biological substances, initial encounter: Secondary | ICD-10-CM | POA: Diagnosis not present

## 2021-01-04 DIAGNOSIS — Z20822 Contact with and (suspected) exposure to covid-19: Secondary | ICD-10-CM | POA: Diagnosis not present

## 2021-01-04 DIAGNOSIS — J9611 Chronic respiratory failure with hypoxia: Secondary | ICD-10-CM | POA: Diagnosis not present

## 2021-01-04 DIAGNOSIS — J189 Pneumonia, unspecified organism: Secondary | ICD-10-CM

## 2021-01-04 DIAGNOSIS — J9601 Acute respiratory failure with hypoxia: Secondary | ICD-10-CM | POA: Diagnosis not present

## 2021-01-04 DIAGNOSIS — J9621 Acute and chronic respiratory failure with hypoxia: Secondary | ICD-10-CM | POA: Diagnosis not present

## 2021-01-04 DIAGNOSIS — I5033 Acute on chronic diastolic (congestive) heart failure: Secondary | ICD-10-CM | POA: Diagnosis not present

## 2021-01-04 DIAGNOSIS — I5030 Unspecified diastolic (congestive) heart failure: Secondary | ICD-10-CM | POA: Diagnosis not present

## 2021-01-04 DIAGNOSIS — I110 Hypertensive heart disease with heart failure: Secondary | ICD-10-CM | POA: Diagnosis not present

## 2021-01-04 DIAGNOSIS — I4891 Unspecified atrial fibrillation: Secondary | ICD-10-CM | POA: Diagnosis not present

## 2021-01-04 DIAGNOSIS — J984 Other disorders of lung: Secondary | ICD-10-CM | POA: Diagnosis not present

## 2021-01-04 DIAGNOSIS — I4819 Other persistent atrial fibrillation: Secondary | ICD-10-CM

## 2021-01-04 HISTORY — PX: CARDIOVERSION: SHX1299

## 2021-01-04 LAB — BASIC METABOLIC PANEL
Anion gap: 9 (ref 5–15)
BUN: 22 mg/dL (ref 8–23)
CO2: 25 mmol/L (ref 22–32)
Calcium: 8.5 mg/dL — ABNORMAL LOW (ref 8.9–10.3)
Chloride: 97 mmol/L — ABNORMAL LOW (ref 98–111)
Creatinine, Ser: 0.89 mg/dL (ref 0.44–1.00)
GFR, Estimated: >60 mL/min (ref >60)
Glucose, Bld: 110 mg/dL — ABNORMAL HIGH (ref 70–99)
Potassium: 3 mmol/L — ABNORMAL LOW (ref 3.5–5.1)
Sodium: 131 mmol/L — ABNORMAL LOW (ref 135–145)

## 2021-01-04 LAB — MAGNESIUM: Magnesium: 2 mg/dL (ref 1.7–2.4)

## 2021-01-04 LAB — SEDIMENTATION RATE: Sed Rate: 60 mm/hr — ABNORMAL HIGH (ref 0–22)

## 2021-01-04 LAB — BRAIN NATRIURETIC PEPTIDE: B Natriuretic Peptide: 161 pg/mL — ABNORMAL HIGH (ref 0.0–100.0)

## 2021-01-04 SURGERY — CARDIOVERSION
Anesthesia: General

## 2021-01-04 MED ORDER — ORAL CARE MOUTH RINSE: 15.0000 mL | Freq: Once | OROMUCOSAL | Status: DC

## 2021-01-04 MED ORDER — SODIUM CHLORIDE 0.9 % IV SOLN: INTRAVENOUS | Status: DC | PRN

## 2021-01-04 MED ORDER — LACTATED RINGERS IV SOLN: INTRAVENOUS | Status: DC

## 2021-01-04 MED ORDER — PREDNISONE 20 MG PO TABS
40.0000 mg | ORAL_TABLET | Freq: Every day | ORAL | Status: DC
  Administered 2021-01-04 – 2021-01-07 (×4): 40 mg via ORAL
  Filled 2021-01-04 (×4): qty 2

## 2021-01-04 MED ORDER — SODIUM CHLORIDE 0.9 % IV BOLUS
500.0000 mL | Freq: Once | INTRAVENOUS | Status: AC
  Administered 2021-01-04: 500 mL via INTRAVENOUS

## 2021-01-04 MED ORDER — POTASSIUM CHLORIDE 20 MEQ PO PACK
40.0000 meq | PACK | Freq: Four times a day (QID) | ORAL | Status: AC
  Administered 2021-01-04 (×2): 40 meq via ORAL
  Filled 2021-01-04 (×2): qty 2

## 2021-01-04 MED ORDER — MELATONIN 3 MG PO TABS
9.0000 mg | ORAL_TABLET | Freq: Every day | ORAL | Status: DC
  Administered 2021-01-04 – 2021-01-06 (×3): 9 mg via ORAL
  Filled 2021-01-04 (×3): qty 3

## 2021-01-04 MED ORDER — FUROSEMIDE 40 MG PO TABS
40.0000 mg | ORAL_TABLET | Freq: Every day | ORAL | Status: DC
  Administered 2021-01-05 – 2021-01-07 (×3): 40 mg via ORAL
  Filled 2021-01-04 (×3): qty 1

## 2021-01-04 MED ORDER — AMIODARONE HCL 200 MG PO TABS
200.0000 mg | ORAL_TABLET | Freq: Two times a day (BID) | ORAL | Status: DC
  Administered 2021-01-04 – 2021-01-07 (×7): 200 mg via ORAL
  Filled 2021-01-04 (×7): qty 1

## 2021-01-04 MED ORDER — CHLORHEXIDINE GLUCONATE 0.12 % MT SOLN: 15.0000 mL | Freq: Once | OROMUCOSAL | Status: DC

## 2021-01-04 MED ORDER — PROPOFOL 10 MG/ML IV BOLUS
INTRAVENOUS | Status: DC | PRN
Start: 2021-01-04 — End: 2021-01-04
  Administered 2021-01-04: 40 mg via INTRAVENOUS

## 2021-01-04 NOTE — Anesthesia Preprocedure Evaluation (Addendum)
Anesthesia Evaluation  Patient identified by MRN, date of birth, ID band Patient awake    Reviewed: Allergy & Precautions, NPO status , Patient's Chart, lab work & pertinent test results  History of Anesthesia Complications Negative for: history of anesthetic complications  Airway Mallampati: II  TM Distance: >3 FB Neck ROM: Full    Dental  (+) Dental Advisory Given   Pulmonary shortness of breath and Long-Term Oxygen Therapy,    Pulmonary exam normal breath sounds clear to auscultation       Cardiovascular Exercise Tolerance: Poor hypertension, Pt. on medications + DOE  + dysrhythmias Atrial Fibrillation + Valvular Problems/Murmurs  Rhythm:Irregular Rate:Tachycardia  1. Left ventricular ejection fraction, by estimation, is 55 to 60%. The  left ventricle has normal function. Left ventricular diastolic parameters  are indeterminate.  2. Right ventricular systolic function is normal. The right ventricular  size is normal.  3. IVC is small suggesting low RA pressure and hypovolemia   Neuro/Psych CVA, Residual Symptoms    GI/Hepatic Neg liver ROS, GERD  Medicated and Controlled,  Endo/Other    Renal/GU negative Renal ROS     Musculoskeletal   Abdominal   Peds  Hematology  (+) anemia ,   Anesthesia Other Findings   Reproductive/Obstetrics                           Anesthesia Physical Anesthesia Plan  ASA: 4  Anesthesia Plan: General   Post-op Pain Management:    Induction: Intravenous  PONV Risk Score and Plan: TIVA  Airway Management Planned: Nasal Cannula and Natural Airway  Additional Equipment:   Intra-op Plan:   Post-operative Plan:   Informed Consent: I have reviewed the patients History and Physical, chart, labs and discussed the procedure including the risks, benefits and alternatives for the proposed anesthesia with the patient or authorized representative who has  indicated his/her understanding and acceptance.    Discussed DNR with patient and Suspend DNR.   Dental advisory given  Plan Discussed with: CRNA and Surgeon  Anesthesia Plan Comments:       Anesthesia Quick Evaluation

## 2021-01-04 NOTE — Transfer of Care (Signed)
Immediate Anesthesia Transfer of Care Note  Patient: Farida Mcreynolds  Procedure(s) Performed: CARDIOVERSION  Patient Location: PACU  Anesthesia Type:MAC  Level of Consciousness: awake, alert , patient cooperative and responds to stimulation  Airway & Oxygen Therapy: Patient Spontanous Breathing and Patient connected to face mask oxygen  Post-op Assessment: Report given to RN, Post -op Vital signs reviewed and stable and Patient moving all extremities X 4  Post vital signs: Reviewed and stable  Last Vitals:  Vitals Value Taken Time  BP    Temp    Pulse    Resp    SpO2      Last Pain:  Vitals:   01/04/21 0800  TempSrc:   PainSc: 0-No pain      Patients Stated Pain Goal: 8 (63/78/58 8502)  Complications: No notable events documented.

## 2021-01-04 NOTE — Progress Notes (Signed)
PROGRESS NOTE    Monique Robles  SRP:[id number] DOB: 28-Oct-1940 DOA: 12/31/2020 PCP: Celene Squibb, MD   Brief Narrative:   Monique Robles is a 80 y.o. female with PMH significant of paroxysmal A. fib on Eliquis, diastolic CHF with the last LVEF 65 to 70%, GERD, history of pulmonary embolism, history of stroke, hypertension, hyperlipidemia, vitamin D deficiency, essential tremor presented to the ED with worsening shortness of breath.  Patient was seen in the ED last week and found to have A. fib with RVR.  Patient underwent successful cardioversion into normal sinus rhythm and patient was discharged to rehab.  Patient has been admitted with recurrent atrial fibrillation with RVR with associated acute on chronic hypoxemic respiratory failure in the setting of CHF. She also has a UTI.  Assessment & Plan:   Principal Problem:   Severe sepsis ([place]) Active Problems:   Essential tremor   Overactive bladder   GERD (gastroesophageal reflux disease)   Insomnia   Iron deficiency anemia due to chronic blood loss   Chronic lung disease   Chronic respiratory failure with hypoxia (HCC)   Recurrent A. fib with RVR: Patient presented with increasing shortness of breath associated with palpitations. She was found to have A. fib with RVR, heart rate 140- 146. Restarted home propranolol, and started on amiodarone infusion as well as Cardizem infusion Continue Eliquis. Last echocardiogram EF 65 to 70%.  Mild aortic stenosis. -Amiodarone/Cardizem held on 10/26 since she became hypotensive, possibly related to overdiuresis -Patient underwent DCCV on 10/28 -Currently in sinus rhythm -Continue to monitor for now   Acute on chronic hypoxic respiratory failure  Patient was recently discharged to rehab on 2 L of supplemental oxygen. She remained on 2 L until oxygen had to be titrated up to NRB on 10/25 Oxygen has since been weaned back down to 2 L Supplemental oxygen to keep saturation above  90%. -Etiology of respiratory failure felt to be multifactorial, related to CHF, atrial fibrillation, pulmonary fibrosis  Acute on chronic diastolic congestive heart failure -Cardiology following -She was being diuresed with IV Lasix -Overall net volume status has been negative -Creatinine had bumped up and IV diuretics were held -Plan is to start on oral diuretics 10/29  Pulmonary fibrosis related to nitrofurantoin -Patchy findings on chest imaging worrisome for underlying infection versus ILD -Appreciate pulmonology input -Currently on ceftriaxone to cover for any element of infection -ESR elevated and she is been started on a prolonged course of prednisone -Discontinue antibiotics after today's dose    UTI Discontinued cefepime and vancomycin, switched to Rocephin Urine culture did not show any growth Procalcitonin low -Antibiotics now discontinued  Hypokalemia Replaced   Pleural effusion: CT chest shows bilateral pleural effusion It does not seem she needs thoracocentesis. -Overall effusions have improved with diuresis   Orthostatic hypotension: Continue midodrine.   Essential tremor: Continue on propranolol  AKI -Likely related to diuresis and hypotension -Overall creatinine has returned to baseline with holding diuretics.   DVT prophylaxis:Eliquis Code Status: DNR Family Communication: Discussed with daughter at bedside on 10/27 Disposition Plan:  Status is: Inpatient  Remains inpatient appropriate because: Needs IV medications.  Consultants:  Cardiology Pulmonology  Procedures:  See below  Antimicrobials:  Anti-infectives (From admission, onward)    Start     Dose/Rate Route Frequency Ordered Stop   01/01/21 1600  vancomycin (VANCOCIN) IVPB 1000 mg/200 mL premix  Status:  Discontinued        1,000 mg 200 mL/hr over 60 Minutes  Intravenous Every 24 hours 12/31/20 1503 01/01/21 0752   01/01/21 1000  cefTRIAXone (ROCEPHIN) 1 g in sodium chloride 0.9  % 100 mL IVPB        1 g 200 mL/hr over 30 Minutes Intravenous Every 24 hours 01/01/21 0752 01/04/21 1257   12/31/20 2200  ceFEPIme (MAXIPIME) 2 g in sodium chloride 0.9 % 100 mL IVPB  Status:  Discontinued        2 g 200 mL/hr over 30 Minutes Intravenous Every 12 hours 12/31/20 1728 01/01/21 0752   12/31/20 1515  ceFEPIme (MAXIPIME) 2 g in sodium chloride 0.9 % 100 mL IVPB        2 g 200 mL/hr over 30 Minutes Intravenous  Once 12/31/20 1500 12/31/20 1538   12/31/20 1515  vancomycin (VANCOREADY) IVPB 1750 mg/350 mL        1,750 mg 175 mL/hr over 120 Minutes Intravenous  Once 12/31/20 1501 12/31/20 1749   12/31/20 1500  ceFEPIme (MAXIPIME) 1 g in sodium chloride 0.9 % 100 mL IVPB  Status:  Discontinued        1 g 200 mL/hr over 30 Minutes Intravenous  Once 12/31/20 1456 12/31/20 1500      Subjective: She says she is feeling better today.  Shortness of breath is improving.  She is still quite weak.  Objective: Vitals:   01/04/21 1530 01/04/21 1600 01/04/21 1700 01/04/21 1800  BP:  (!) 103/49 (!) 99/58 124/80  Pulse:  72 71 76  Resp:  (!) 23 (!) 21 (!) 25  Temp: 97.6 F (36.4 C)     TempSrc: Oral     SpO2:  100% 100% 97%  Weight:      Height:        Intake/Output Summary (Last 24 hours) at 01/04/2021 1917 Last data filed at 01/04/2021 1600 Gross per 24 hour  Intake 1311.25 ml  Output 1670 ml  Net -358.75 ml   Filed Weights   12/31/20 1252 01/02/21 0542  Weight: 83.5 kg 87.4 kg    Examination:  General exam: Alert, awake, oriented x 3 Respiratory system: Clear to auscultation. Respiratory effort normal. Cardiovascular system:RRR. No murmurs, rubs, gallops. Gastrointestinal system: Abdomen is nondistended, soft and nontender. No organomegaly or masses felt. Normal bowel sounds heard. Central nervous system: Alert and oriented. No focal neurological deficits. Extremities: No C/C/E, +pedal pulses Skin: No rashes, lesions or ulcers Psychiatry: Judgement and insight  appear normal. Mood & affect appropriate.     Data Reviewed: I have personally reviewed following labs and imaging studies  CBC: Recent Labs  Lab 12/31/20 1506 01/01/21 0503 01/02/21 0451  WBC 12.3* 11.1* 10.2  NEUTROABS 7.1  --   --   HGB 10.9* 10.1* 9.5*  HCT 36.9 34.7* 33.1*  MCV 89.1 90.4 89.0  PLT 602* 549* 295*   Basic Metabolic Panel: Recent Labs  Lab 12/31/20 1333 01/01/21 0503 01/02/21 0451 01/03/21 0427 01/04/21 0405 01/04/21 1523  NA 137 137 135 132* 131*  --   K 3.7 3.4* 3.8 3.9 3.0*  --   CL 107 103 101 95* 97*  --   CO2 _0 --   GLUCOSE 99 90 105* 116* 110*  --   BUN _1 27* 22  --   CREATININE 0.67 0.74 0.96 1.38* 0.89  --   CALCIUM 8.2* 8.4* 8.7* 8.8* 8.5*  --   MG 2.0  --  1.9 2.2  --  2.0   GFR: Estimated Creatinine  Clearance: 52.3 mL/min (by C-G formula based on SCr of 0.89 mg/dL). Liver Function Tests: Recent Labs  Lab 12/31/20 1333 01/01/21 0503  AST 28 22  ALT 12 10  ALKPHOS 79 74  BILITOT 0.8 0.6  PROT 6.4* 6.1*  ALBUMIN 3.0* 2.7*   No results for input(s): LIPASE, AMYLASE in the last 168 hours. No results for input(s): AMMONIA in the last 168 hours. Coagulation Profile: Recent Labs  Lab 01/01/21 0503  INR 1.9*   Cardiac Enzymes: No results for input(s): CKTOTAL, CKMB, CKMBINDEX, TROPONINI in the last 168 hours. BNP (last 3 results) Recent Labs    12/05/20 1636  PROBNP 277.0*   HbA1C: No results for input(s): HGBA1C in the last 72 hours. CBG: No results for input(s): GLUCAP in the last 168 hours. Lipid Profile: No results for input(s): CHOL, HDL, LDLCALC, TRIG, CHOLHDL, LDLDIRECT in the last 72 hours. Thyroid Function Tests: No results for input(s): TSH, T4TOTAL, FREET4, T3FREE, THYROIDAB in the last 72 hours. Anemia Panel: No results for input(s): VITAMINB12, FOLATE, FERRITIN, TIBC, IRON, RETICCTPCT in the last 72 hours. Sepsis Labs: Recent Labs  Lab 12/31/20 1334 12/31/20 1506 01/01/21 0503   PROCALCITON  --   --  <0.10  LATICACIDVEN 1.6 1.7  --     Recent Results (from the past 240 hour(s))  Resp Panel by RT-PCR (Flu A&B, Covid) Nasopharyngeal Swab     Status: None   Collection Time: 12/27/20  5:51 PM   Specimen: Nasopharyngeal Swab; Nasopharyngeal(NP) swabs in vial transport medium  Result Value Ref Range Status   SARS Coronavirus 2 by RT PCR NEGATIVE NEGATIVE Final    Comment: (NOTE) SARS-CoV-2 target nucleic acids are NOT DETECTED.  The SARS-CoV-2 RNA is generally detectable in upper respiratory specimens during the acute phase of infection. The lowest concentration of SARS-CoV-2 viral copies this assay can detect is 138 copies/mL. A negative result does not preclude SARS-Cov-2 infection and should not be used as the sole basis for treatment or other patient management decisions. A negative result may occur with  improper specimen collection/handling, submission of specimen other than nasopharyngeal swab, presence of viral mutation(s) within the areas targeted by this assay, and inadequate number of viral copies(<138 copies/mL). A negative result must be combined with clinical observations, patient history, and epidemiological information. The expected result is Negative.  Fact Sheet for Patients:  EntrepreneurPulse.com.au  Fact Sheet for Healthcare Providers:  IncredibleEmployment.be  This test is no t yet approved or cleared by the [place] FDA and  has been authorized for detection and/or diagnosis of SARS-CoV-2 by FDA under an Emergency Use Authorization (EUA). This EUA will remain  in effect (meaning this test can be used) for the duration of the COVID-19 declaration under Section 564(b)(1) of the Act, 21 U.S.C.section 360bbb-3(b)(1), unless the authorization is terminated  or revoked sooner.       Influenza A by PCR NEGATIVE NEGATIVE Final   Influenza B by PCR NEGATIVE NEGATIVE Final    Comment: (NOTE) The  Xpert Xpress SARS-CoV-2/FLU/RSV plus assay is intended as an aid in the diagnosis of influenza from Nasopharyngeal swab specimens and should not be used as a sole basis for treatment. Nasal washings and aspirates are unacceptable for Xpert Xpress SARS-CoV-2/FLU/RSV testing.  Fact Sheet for Patients: EntrepreneurPulse.com.au  Fact Sheet for Healthcare Providers: IncredibleEmployment.be  This test is not yet approved or cleared by the [place] FDA and has been authorized for detection and/or diagnosis of SARS-CoV-2 by FDA under an Emergency Use Authorization (  EUA). This EUA will remain in effect (meaning this test can be used) for the duration of the COVID-19 declaration under Section 564(b)(1) of the Act, 21 U.S.C. section 360bbb-3(b)(1), unless the authorization is terminated or revoked.  Performed at [facility], [address] [place], [place] [address]   Resp Panel by RT-PCR (Flu A&B, Covid) Nasopharyngeal Swab     Status: None   Collection Time: 12/31/20  1:26 PM   Specimen: Nasopharyngeal Swab; Nasopharyngeal(NP) swabs in vial transport medium  Result Value Ref Range Status   SARS Coronavirus 2 by RT PCR NEGATIVE NEGATIVE Final    Comment: (NOTE) SARS-CoV-2 target nucleic acids are NOT DETECTED.  The SARS-CoV-2 RNA is generally detectable in upper respiratory specimens during the acute phase of infection. The lowest concentration of SARS-CoV-2 viral copies this assay can detect is 138 copies/mL. A negative result does not preclude SARS-Cov-2 infection and should not be used as the sole basis for treatment or other patient management decisions. A negative result may occur with  improper specimen collection/handling, submission of specimen other than nasopharyngeal swab, presence of viral mutation(s) within the areas targeted by this assay, and inadequate number of viral copies(<138 copies/mL). A negative result must be combined  with clinical observations, patient history, and epidemiological information. The expected result is Negative.  Fact Sheet for Patients:  EntrepreneurPulse.com.au  Fact Sheet for Healthcare Providers:  IncredibleEmployment.be  This test is no t yet approved or cleared by the [place] FDA and  has been authorized for detection and/or diagnosis of SARS-CoV-2 by FDA under an Emergency Use Authorization (EUA). This EUA will remain  in effect (meaning this test can be used) for the duration of the COVID-19 declaration under Section 564(b)(1) of the Act, 21 U.S.C.section 360bbb-3(b)(1), unless the authorization is terminated  or revoked sooner.       Influenza A by PCR NEGATIVE NEGATIVE Final   Influenza B by PCR NEGATIVE NEGATIVE Final    Comment: (NOTE) The Xpert Xpress SARS-CoV-2/FLU/RSV plus assay is intended as an aid in the diagnosis of influenza from Nasopharyngeal swab specimens and should not be used as a sole basis for treatment. Nasal washings and aspirates are unacceptable for Xpert Xpress SARS-CoV-2/FLU/RSV testing.  Fact Sheet for Patients: EntrepreneurPulse.com.au  Fact Sheet for Healthcare Providers: IncredibleEmployment.be  This test is not yet approved or cleared by the [place] FDA and has been authorized for detection and/or diagnosis of SARS-CoV-2 by FDA under an Emergency Use Authorization (EUA). This EUA will remain in effect (meaning this test can be used) for the duration of the COVID-19 declaration under Section 564(b)(1) of the Act, 21 U.S.C. section 360bbb-3(b)(1), unless the authorization is terminated or revoked.  Performed at [facility], [address] [place], [place] [address]   Blood culture (routine x 2)     Status: None (Preliminary result)   Collection Time: 12/31/20  1:43 PM   Specimen: Right Antecubital; Blood  Result Value Ref Range Status   Specimen  Description RIGHT ANTECUBITAL  Final   Special Requests   Final    BOTTLES DRAWN AEROBIC AND ANAEROBIC Blood Culture results may not be optimal due to an inadequate volume of blood received in culture bottles   Culture   Final    NO GROWTH 4 DAYS Performed at [facility], [address], [place], [place] [address]    Report Status PENDING  Incomplete  Blood culture (routine x 2)     Status: None (Preliminary result)   Collection Time:  12/31/20  1:46 PM   Specimen: Left Antecubital; Blood  Result Value Ref Range Status   Specimen Description LEFT ANTECUBITAL  Final   Special Requests   Final    BOTTLES DRAWN AEROBIC AND ANAEROBIC Blood Culture adequate volume   Culture   Final    NO GROWTH 4 DAYS Performed at [facility], [address], [place], [place] [address]    Report Status PENDING  Incomplete  MRSA Next Gen by PCR, Nasal     Status: None   Collection Time: 12/31/20  4:29 PM   Specimen: Nasal Mucosa; Nasal Swab  Result Value Ref Range Status   MRSA by PCR Next Gen NOT DETECTED NOT DETECTED Final    Comment: (NOTE) The GeneXpert MRSA Assay (FDA approved for NASAL specimens only), is one component of a comprehensive MRSA colonization surveillance program. It is not intended to diagnose MRSA infection nor to guide or monitor treatment for MRSA infections. Test performance is not FDA approved in patients less than 28 years old. Performed at [facility], [address] [place], [place] [address]   Urine Culture     Status: None   Collection Time: 01/01/21 10:15 AM   Specimen: Urine, Clean Catch  Result Value Ref Range Status   Specimen Description   Final    URINE, CLEAN CATCH Performed at [facility], [address], [place], [place] [address]    Special Requests   Final    NONE Performed at [facility], [address], [place], [place] [address]    Culture   Final    NO GROWTH Performed at [facility], [place] [address], [place], [place] [address]    Report  Status 01/02/2021 FINAL  Final         Radiology Studies: DG Chest Port 1 View  Result Date: 01/03/2021 CLINICAL DATA:  Respiratory distress EXAM: PORTABLE CHEST 1 VIEW COMPARISON:  Chest x-ray 12/31/2020 FINDINGS: Cardiomegaly and mediastinum appear unchanged. Calcified plaques in the aortic arch. Persistent prominent interstitial and patchy irregular airspace opacities identified bilaterally, not significantly changed since previous study. No new consolidation appreciated. No significant pleural effusion. No pneumothorax. IMPRESSION: No significant change since previous study. Electronically Signed   By: Ofilia Neas M.D.   On: 01/03/2021 13:20   ECHOCARDIOGRAM LIMITED  Result Date: 01/03/2021    ECHOCARDIOGRAM LIMITED REPORT   Patient Name:   NAYDELINE MORACE Date of Exam: 01/03/2021 Medical Rec #:  [id number]        Height:       61.0 in Accession #:    [id number]       Weight:       192.7 lb Date of Birth:  [birth year]        BSA:          1.859 m Patient Age:    80 years         BP:           113/60 mmHg Patient Gender: F                HR:           105 bpm. Exam Location:  [facility] Procedure: Limited Echo Indications:    Dyspnea  History:        Patient has prior history of Echocardiogram examinations, most                 recent 09/13/2020. Stroke, Signs/Symptoms:Dyspnea; Risk  Factors:Hypertension and Dyslipidemia. Sepsis.  Sonographer:    Wenda Low Referring Phys: [id number] [place]  1. Left ventricular ejection fraction, by estimation, is 55 to 60%. The left ventricle has normal function. Left ventricular diastolic parameters are indeterminate.  2. Right ventricular systolic function is normal. The right ventricular size is normal.  3. IVC is small suggesting low RA pressure and hypovolemia. FINDINGS  Left Ventricle: Left ventricular ejection fraction, by estimation, is 55 to 60%. The left ventricle has normal function. Left ventricular  diastolic parameters are indeterminate. Right Ventricle: The right ventricular size is normal. No increase in right ventricular wall thickness. Right ventricular systolic function is normal. Venous: IVC is small suggesting low RA pressure and hypovolemia. LEFT VENTRICLE PLAX 2D LVIDd:         4.90 cm LVIDs:         3.30 cm LV PW:         0.70 cm LV IVS:        1.10 cm LVOT diam:     1.80 cm LVOT Area:     2.54 cm  LEFT ATRIUM         Index LA diam:    5.10 cm 2.74 cm/m   AORTA Ao Root diam: 2.90 cm Ao Asc diam:  3.20 cm  SHUNTS Systemic Diam: 1.80 cm Carlyle Dolly MD Electronically signed by Carlyle Dolly MD Signature Date/Time: 01/03/2021/4:29:18 PM    Final         Scheduled Meds:  acidophilus  1 capsule Oral Daily   amiodarone  200 mg Oral BID   apixaban  5 mg Oral BID   Chlorhexidine Gluconate Cloth  6 each Topical Daily   cholecalciferol  1,000 Units Oral Daily   docusate sodium  100 mg Oral BID   [START ON 01/05/2021] furosemide  40 mg Oral Daily   gabapentin  200 mg Oral TID   melatonin  9 mg Oral QHS   midodrine  5 mg Oral TID WC   pantoprazole  40 mg Oral Daily   predniSONE  40 mg Oral Q breakfast   sucralfate  1 g Oral QHS   Continuous Infusions:  sodium chloride Stopped (01/04/21 1346)     LOS: 4 days    Time spent: 35 minutes    Kathie Dike, MD [facility]  If 7PM-7AM, please contact night-coverage [email or web address] 01/04/2021, 7:17 PM

## 2021-01-04 NOTE — Anesthesia Postprocedure Evaluation (Signed)
Anesthesia Post Note  Patient: Monique Robles  Procedure(s) Performed: CARDIOVERSION  Patient location during evaluation: PACU Anesthesia Type: General Level of consciousness: awake and alert Pain management: pain level controlled Vital Signs Assessment: post-procedure vital signs reviewed and stable Respiratory status: spontaneous breathing, nonlabored ventilation, respiratory function stable and patient connected to nasal cannula oxygen Cardiovascular status: blood pressure returned to baseline and stable Postop Assessment: no apparent nausea or vomiting Anesthetic complications: no   No notable events documented.   Last Vitals:  Vitals:   01/04/21 1300 01/04/21 1400  BP: 121/66 128/66  Pulse: 70 69  Resp: (!) 22 (!) 21  Temp:    SpO2: 100% 100%    Last Pain:  Vitals:   01/04/21 1145  TempSrc:   PainSc: 0-No pain                 [name]

## 2021-01-04 NOTE — CV Procedure (Signed)
CV Procedure Note  Procedure: electrical cardioversion Physician: Dr Maryann Alar Indication: Persistent atrial fibrillation   Patient was brought to the procedure suite after appropriate consent was obtained. Defib pads were placed in the anterior and posterior positions. Sedation was achieved with the assistance of anesthesiology, please see there notes for details. Succesfullly cardioversted to NSR with a single synchronized 200j shock. Cardiopulmonary monitoring performed throughout the procedure, she tolerated well without complications   Maryann Alar

## 2021-01-04 NOTE — Plan of Care (Signed)

## 2021-01-04 NOTE — Progress Notes (Addendum)
Progress Note  Patient Name: Monique Robles Date of Encounter: 01/04/2021  [facility] Cardiologist: Dorris Carnes, MD   Subjective   Ongoing SOB  Inpatient Medications    Scheduled Meds:  acidophilus  1 capsule Oral Daily   apixaban  5 mg Oral BID   Chlorhexidine Gluconate Cloth  6 each Topical Daily   cholecalciferol  1,000 Units Oral Daily   docusate sodium  100 mg Oral BID   gabapentin  200 mg Oral TID   melatonin  10 mg Oral QHS   midodrine  5 mg Oral TID WC   pantoprazole  40 mg Oral Daily   sucralfate  1 g Oral QHS   Continuous Infusions:  amiodarone 30 mg/hr (01/04/21 0650)   cefTRIAXone (ROCEPHIN)  IV Stopped (01/03/21 0935)   diltiazem (CARDIZEM) infusion Stopped (01/03/21 0404)   PRN Meds: acetaminophen **OR** acetaminophen, cyclobenzaprine, HYDROcodone-acetaminophen, levalbuterol, Muscle Rub, ondansetron **OR** ondansetron (ZOFRAN) IV, senna-docusate   Vital Signs    Vitals:   01/04/21 0600 01/04/21 0630 01/04/21 0700 01/04/21 0730  BP: (!) 85/51 113/88 (!) 109/59 (!) 90/50  Pulse: 64 76 (!) 152 95  Resp: (!) 22 20 (!) 26 (!) 23  Temp:    97.8 F (36.6 C)  TempSrc:    Oral  SpO2: 99% 96% 99% 100%  Weight:      Height:        Intake/Output Summary (Last 24 hours) at 01/04/2021 0751 Last data filed at 01/04/2021 0650 Gross per 24 hour  Intake 1211.47 ml  Output 1750 ml  Net -538.53 ml   Last 3 Weights 01/02/2021 12/31/2020 12/27/2020  Weight (lbs) 192 lb 10.9 oz 184 lb 1.4 oz 184 lb 3.2 oz  Weight (kg) 87.4 kg 83.5 kg 83.553 kg      Telemetry    Afib elevated rates - Personally Reviewed  ECG    N/a - Personally Reviewed  Physical Exam   GEN: No acute distress.   Neck: No JVD Cardiac: irreg, tachy Respiratory: Clear to auscultation bilaterally. GI: Soft, nontender, non-distended  MS: No edema; No deformity. Neuro:  Nonfocal  Psych: Normal affect   Labs    High Sensitivity Troponin:   Recent Labs  Lab 12/31/20 1333  12/31/20 1506  TROPONINIHS 9 9     Chemistry Recent Labs  Lab 12/31/20 1333 01/01/21 0503 01/02/21 0451 01/03/21 0427 01/04/21 0405  NA 137 137 135 132* 131*  K 3.7 3.4* 3.8 3.9 3.0*  CL 107 103 101 95* 97*  CO2 '24 24 23 27 25  ' GLUCOSE 99 90 105* 116* 110*  BUN '16 18 21 ' 27* 22  CREATININE 0.67 0.74 0.96 1.38* 0.89  CALCIUM 8.2* 8.4* 8.7* 8.8* 8.5*  MG 2.0  --  1.9 2.2  --   PROT 6.4* 6.1*  --   --   --   ALBUMIN 3.0* 2.7*  --   --   --   AST 28 22  --   --   --   ALT 12 10  --   --   --   ALKPHOS 79 74  --   --   --   BILITOT 0.8 0.6  --   --   --   GFRNONAA >60 >60 >60 39* >60  ANIONGAP '6 10 11 10 9    ' Lipids No results for input(s): CHOL, TRIG, HDL, LABVLDL, LDLCALC, CHOLHDL in the last 168 hours.  Hematology Recent Labs  Lab 12/31/20 1506 01/01/21 0503 01/02/21 0451  WBC 12.3* 11.1* 10.2  RBC 4.14 3.84* 3.72*  HGB 10.9* 10.1* 9.5*  HCT 36.9 34.7* 33.1*  MCV 89.1 90.4 89.0  MCH 26.3 26.3 25.5*  MCHC 29.5* 29.1* 28.7*  RDW 16.8* 16.8* 16.7*  PLT 602* 549* 521*   Thyroid No results for input(s): TSH, FREET4 in the last 168 hours.  BNP Recent Labs  Lab 12/31/20 1334 01/03/21 0427 01/04/21 0405  BNP 556.0* 153.0* 161.0*    DDimer No results for input(s): DDIMER in the last 168 hours.   Radiology    DG Chest Port 1 View  Result Date: 01/03/2021 CLINICAL DATA:  Respiratory distress EXAM: PORTABLE CHEST 1 VIEW COMPARISON:  Chest x-ray 12/31/2020 FINDINGS: Cardiomegaly and mediastinum appear unchanged. Calcified plaques in the aortic arch. Persistent prominent interstitial and patchy irregular airspace opacities identified bilaterally, not significantly changed since previous study. No new consolidation appreciated. No significant pleural effusion. No pneumothorax. IMPRESSION: No significant change since previous study. Electronically Signed   By: Ofilia Neas M.D.   On: 01/03/2021 13:20   ECHOCARDIOGRAM LIMITED  Result Date: 01/03/2021     ECHOCARDIOGRAM LIMITED REPORT   Patient Name:   Monique Robles Date of Exam: 01/03/2021 Medical Rec #:  [id number]        Height:       61.0 in Accession #:    [id number]       Weight:       192.7 lb Date of Birth:  10-02-1940        BSA:          1.859 m Patient Age:    5 years         BP:           113/60 mmHg Patient Gender: F                HR:           105 bpm. Exam Location:  [facility] Procedure: Limited Echo Indications:    Dyspnea  History:        Patient has prior history of Echocardiogram examinations, most                 recent 09/13/2020. Stroke, Signs/Symptoms:Dyspnea; Risk                 Factors:Hypertension and Dyslipidemia. Sepsis.  Sonographer:    Wenda Low Referring Phys: [id number] [place]  1. Left ventricular ejection fraction, by estimation, is 55 to 60%. The left ventricle has normal function. Left ventricular diastolic parameters are indeterminate.  2. Right ventricular systolic function is normal. The right ventricular size is normal.  3. IVC is small suggesting low RA pressure and hypovolemia. FINDINGS  Left Ventricle: Left ventricular ejection fraction, by estimation, is 55 to 60%. The left ventricle has normal function. Left ventricular diastolic parameters are indeterminate. Right Ventricle: The right ventricular size is normal. No increase in right ventricular wall thickness. Right ventricular systolic function is normal. Venous: IVC is small suggesting low RA pressure and hypovolemia. LEFT VENTRICLE PLAX 2D LVIDd:         4.90 cm LVIDs:         3.30 cm LV PW:         0.70 cm LV IVS:        1.10 cm LVOT diam:     1.80 cm LVOT Area:     2.54 cm  LEFT ATRIUM         Index LA  diam:    5.10 cm 2.74 cm/m   AORTA Ao Root diam: 2.90 cm Ao Asc diam:  3.20 cm  SHUNTS Systemic Diam: 1.80 cm Carlyle Dolly MD Electronically signed by Carlyle Dolly MD Signature Date/Time: 01/03/2021/4:29:18 PM    Final     Cardiac Studies    Patient Profile     80 y.o.  female   with a hx of PAF who is being seen 12/31/2020 for the evaluation of A. fib with RVR at the request of Dr. Verner Chol.   Assessment & Plan    1.PAF - admitted with afib with RVR - she had DCCV during recent ER visit but had recurrent afib in setting of UTI - As outpatient difficultly rate controlling due to low bp's.   Seen in consultation by her primary cardiologist Dr Harrington Challenger on [place], started on IV amiodarone. From notes not optimal drug given pulmonary history  but thought to be best option at this time. We have continued - had been on propranolol for tremor, held yesterday for hypotension   -had been on amio and dilt gtt. With aggressive diuresis had drop in blood pressure. Amio and dilt held, amio restarted yesterday  -will plan for cardioversion today, discussed in detail with patient and daughter.      2.Acute on chronic diastolic HF - limited echo this admit LVEF 56-81%, indet diastolic, normal RV - likely exacerbated by afib with RVR - BNP 556 on admit, CT with evidence of pulm edema vs infection - BNP 556-->153, significnat rise in Cr and BUN, IVC small on echo, had some low bp's. Essentially has become dry, diuretics held yesterday. Despite overiduresis remained SOB with ongoing opacities on CXR that would not appear to be secondary to HF.   Cr/BUN much improved today with holding diuretic yesterday - would start oral lasix 47m daily tomorrow   3. SOB - she has been overdiuresed with ongoign SOB, seen by pulmonary yesterday - pulmonary suspect pulmonary toxicity from nitrofurantoin - ESR elevated, from pulm notes may consider steroids - she is now on her basleine 2L [place]  For questions or updates, please contact C[place]etonPlease consult [email or web address] for contact info under        Signed, BCarlyle Dolly MD  01/04/2021, 7:51 AM

## 2021-01-04 NOTE — Consult Note (Signed)
[facility]   Patient name: Monique Robles Admit date: 12/31/2020  DOB: 1940-08-03 LOS: 4  MRN: [id number] Consult date: 01/03/2021  Referring provider: Dr. Roderic Palau CC: Short of breath    History:  80 yo female with history of recurrent UTI's was on nitrofurantoin therapy from January through September 2022.  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  At the time she had changes on chest imaging consistent acute pneumonitis and more chronic fibrosis.  She was discharged home on supplemental oxygen and prednisone.  She had outpt follow up with Dr. Melvyn Novas and had prednisone weaned off.    She presented to the ER on 12/31/20 with dyspnea.  Found to have worsening hypoxia with SpO2 74% on 5 liters and A fib with RVR (HR 150 to 180).  Found to have recurrent UTI.  Started on amiodarone and antibiotics.  She was treated with diuretics.  Heart rate improved and remained in negative fluid balance.  Still had dyspnea and PCCM asked to assess.  Past medical history:  A fib, Diastolic CHF, GERD, PE, Pneumonia, CVA, HTN, MVR, Osteoporosis, Tremor, Vit D deficiency, Overactive bladder  Significant events:  10/24 Admit 10/28 ESR 60 >> restart prednisone; cardioversion >> back in sinus rhythm  Studies:  CT chest 02/23/17 >> post operative changes Rt lung, 5 mm nodule RUL Echo 09/14/20 >> EF 65 to 70%, grade 1 DD, mild LA dilation, mild MR, mild AS CT angio chest 11/22/20 >> prominent LN, diffuse patchy b/l GGO air space disease, mild esophageal dilation CT angio chest 12/31/20 >> b/l patchy GGO, prominent LN, small b/l effusions  Micro:  COVID/Flu 10/24 >> negative MRSA PCR 10/24 >> negative Blood 10/24 >>  Urine 10/25 >> negative  Lines:     Antibiotics:  Cefepime 10/24 Vancomycin 10/24 Rocephin 10/25 >> 10/28  Consults:  Cardiology     Interim history:  Had cardioversion this morning.  In sinus rhythm.  Breathing feels comfortable.  On 3 liters O2 with SpO2 100% at rest.  Not having cough.  Hasn't gotten out of bed yet.  Vital signs:  BP 121/66   Pulse 70   Temp 97.8 F (36.6 C)   Resp (!) 22   Ht _0  (1.549 m)   Wt 87.4 kg   SpO2 100%   BMI 36.41 kg/m   Intake/output:  I/O last 3 completed shifts: In: 2049.2 [P.O.:480; I.V.:468.4; IV Piggyback:1100.8] Out: 3400 [Urine:3400]   Physical exam:   General - alert, jaw tremor Eyes - pupils reactive ENT - no sinus tenderness, no stridor Cardiac - regular rate/rhythm, no murmur Chest - equal breath sounds b/l, no wheezing or rales Abdomen - soft, non tender, + bowel sounds Extremities - no cyanosis, clubbing, or edema Skin - no rashes Neuro - moves extremities, follows commands Psych - normal mood and behavior      Best practice:   DVT - Eliquis SUP - Protonix Nutrition - Carb modified Mobility - OOB   Assessment/plan:   Acute on chronic hypoxic respiratory failure. 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 - sed rate up on 01/04/21 - started prednisone 40 mg daily on 01/04/21; slowly taper off over next few weeks - f/u CXR later during week  of 01/07/21 unless symptoms get worse, then would do imaging study sooner  A fib with RVR. Acute on chronic diastolic CHF. - s/p cardioversion 01/04/21 >> in sinus rhythm - amiodarone, eliquis per cardiology - goal even fluid balance  Recurrent UTI. - completed Abx 01/04/21  Deconditioning. - likely contributing to dyspnea - can probably start PT soon if she remains in sinus rhythm  PCCM will f/u on 01/07/21.  Call [phone] to speak to PCCM if assistance needed over the weekend.  Resolved hospital problems:    Goals of  care/Family discussions:  Code status: DNR  Updated pt's daughter at bedside on 01/04/21.  Labs:   CMP Latest Ref Rng & Units 01/04/2021 01/03/2021 01/02/2021  Glucose 70 - 99 mg/dL 110(H) 116(H) 105(H)  BUN 8 - 23 mg/dL 22 27(H) 21  Creatinine 0.44 - 1.00 mg/dL 0.89 1.38(H) 0.96  Sodium 135 - 145 mmol/L 131(L) 132(L) 135  Potassium 3.5 - 5.1 mmol/L 3.0(L) 3.9 3.8  Chloride 98 - 111 mmol/L 97(L) 95(L) 101  CO2 22 - 32 mmol/L _0 Calcium 8.9 - 10.3 mg/dL 8.5(L) 8.8(L) 8.7(L)  Total Protein 6.5 - 8.1 g/dL - - -  Total Bilirubin 0.3 - 1.2 mg/dL - - -  Alkaline Phos 38 - 126 U/L - - -  AST 15 - 41 U/L - - -  ALT 0 - 44 U/L - - -    CBC Latest Ref Rng & Units 01/02/2021 01/01/2021 12/31/2020  WBC 4.0 - 10.5 K/uL 10.2 11.1(H) 12.3(H)  Hemoglobin 12.0 - 15.0 g/dL 9.5(L) 10.1(L) 10.9(L)  Hematocrit 36.0 - 46.0 % 33.1(L) 34.7(L) 36.9  Platelets 150 - 400 K/uL 521(H) 549(H) 602(H)    ABG    Component Value Date/Time   PHART 7.452 (H) 11/24/2020 0905   PCO2ART 36.3 11/24/2020 0905   PO2ART 57.6 (L) 11/24/2020 0905   HCO3 24.0 12/31/2020 1325   ACIDBASEDEF 3.2 (H) 11/23/2020 0945   O2SAT 45.1 12/31/2020 1325    CBG (last 3)  No results for input(s): GLUCAP in the last 72 hours.   Signature:   Chesley Mires, MD [place] Pager - [phone] 01/04/2021, 1:43 PM

## 2021-01-04 NOTE — Progress Notes (Signed)
Electrical Cardioversion Procedure Note Monique Robles [id number] [birth year]  Procedure: Electrical Cardioversion Indications:  Atrial Fibrillation  Procedure Details Consent: Risks of procedure as well as the alternatives and risks of each were explained to the (patient/caregiver).  Consent for procedure obtained. Time Out: Verified patient identification, verified procedure, site/side was marked, verified correct patient position, special equipment/implants available, medications/allergies/relevent history reviewed, required imaging and test results available.  Performed  Patient placed on cardiac monitor, pulse oximetry, supplemental oxygen as necessary.  Sedation given: Short-acting barbiturates and prop Pacer pads placed anterior and posterior chest.  Cardioverted 1 time(Robles).  Cardioverted at [name].  Evaluation Findings: Post procedure EKG shows: NSR Complications: None Patient did tolerate procedure well.   Monique Robles 01/04/2021, 11:55 AM

## 2021-01-05 DIAGNOSIS — J9611 Chronic respiratory failure with hypoxia: Secondary | ICD-10-CM | POA: Diagnosis not present

## 2021-01-05 DIAGNOSIS — I5030 Unspecified diastolic (congestive) heart failure: Secondary | ICD-10-CM | POA: Diagnosis not present

## 2021-01-05 DIAGNOSIS — J9601 Acute respiratory failure with hypoxia: Secondary | ICD-10-CM | POA: Diagnosis not present

## 2021-01-05 DIAGNOSIS — J984 Other disorders of lung: Secondary | ICD-10-CM | POA: Diagnosis not present

## 2021-01-05 LAB — CULTURE, BLOOD (ROUTINE X 2)
Culture: NO GROWTH
Culture: NO GROWTH
Special Requests: ADEQUATE

## 2021-01-05 LAB — BASIC METABOLIC PANEL
Anion gap: 5 (ref 5–15)
BUN: 20 mg/dL (ref 8–23)
CO2: 27 mmol/L (ref 22–32)
Calcium: 8.9 mg/dL (ref 8.9–10.3)
Chloride: 101 mmol/L (ref 98–111)
Creatinine, Ser: 0.69 mg/dL (ref 0.44–1.00)
GFR, Estimated: >60 mL/min (ref >60)
Glucose, Bld: 130 mg/dL — ABNORMAL HIGH (ref 70–99)
Potassium: 4.2 mmol/L (ref 3.5–5.1)
Sodium: 133 mmol/L — ABNORMAL LOW (ref 135–145)

## 2021-01-05 NOTE — Plan of Care (Signed)
  Problem: Acute Rehab PT Goals(only PT should resolve) Goal: Pt Will Go Supine/Side To Sit Flowsheets (Taken 01/05/2021 1032) Pt will go Supine/Side to Sit: with minimal assist Goal: Pt Will Go Sit To Supine/Side Flowsheets (Taken 01/05/2021 1032) Pt will go Sit to Supine/Side: with minimal assist Goal: Patient Will Transfer Sit To/From Stand Flowsheets (Taken 01/05/2021 1032) Patient will transfer sit to/from stand: with min guard assist Note: With RW  Goal: Pt Will Transfer Bed To Chair/Chair To Bed Flowsheets (Taken 01/05/2021 1032) Pt will Transfer Bed to [place] to Bed: with mod assist Note: With RW Goal: Pt Will Ambulate Flowsheets (Taken 01/05/2021 1032) Pt will Ambulate:  10 feet  with moderate assist  with rolling walker   10:33 AM, 01/05/21 [name], DPT Physical Therapy with [facility]  [phone] office

## 2021-01-05 NOTE — Progress Notes (Signed)
PROGRESS NOTE    Monique Robles  BBC:[id number] DOB: 1940/05/25 DOA: 12/31/2020 PCP: Celene Squibb, MD   Brief Narrative:   Monique Robles is a 80 y.o. female with PMH significant of paroxysmal A. fib on Eliquis, diastolic CHF with the last LVEF 65 to 70%, GERD, history of pulmonary embolism, history of stroke, hypertension, hyperlipidemia, vitamin D deficiency, essential tremor presented to the ED with worsening shortness of breath.  Patient was seen in the ED last week and found to have A. fib with RVR.  Patient underwent successful cardioversion into normal sinus rhythm and patient was discharged to rehab.  Patient has been admitted with recurrent atrial fibrillation with RVR with associated acute on chronic hypoxemic respiratory failure in the setting of CHF. She also has a UTI.  Assessment & Plan:   Principal Problem:   Severe sepsis ([place]) Active Problems:   Essential tremor   Overactive bladder   GERD (gastroesophageal reflux disease)   Insomnia   Iron deficiency anemia due to chronic blood loss   Chronic lung disease   Chronic respiratory failure with hypoxia (HCC)   Recurrent A. fib with RVR: Patient presented with increasing shortness of breath associated with palpitations. She was found to have A. fib with RVR, heart rate 140- 146. Restarted home propranolol, and started on amiodarone infusion as well as Cardizem infusion Continue Eliquis. Last echocardiogram EF 65 to 70%.  Mild aortic stenosis. -Amiodarone/Cardizem held on 10/26 since she became hypotensive, possibly related to overdiuresis -Patient underwent DCCV on 10/28 -Currently in sinus rhythm -She is continued on oral amiodarone, she is also on propranolol -Continue to monitor for now   Acute on chronic hypoxic respiratory failure  Patient was recently discharged to rehab on 2 L of supplemental oxygen. She remained on 2 L until oxygen had to be titrated up to NRB on 10/25 Oxygen has since been weaned back  down to 2 L Supplemental oxygen to keep saturation above 90%. -Etiology of respiratory failure felt to be multifactorial, related to CHF, atrial fibrillation, pulmonary fibrosis  Acute on chronic diastolic congestive heart failure -Cardiology following -She was being diuresed with IV Lasix -Overall net volume status has been negative -Creatinine had bumped up and IV diuretics were held -Currently on oral diuretics  Pulmonary fibrosis related to nitrofurantoin -Patchy findings on chest imaging worrisome for underlying infection versus ILD -Appreciate pulmonology input -She completed a course of antibiotics. -ESR elevated and she is been started on a prolonged course of prednisone  UTI Discontinued cefepime and vancomycin, switched to Rocephin Urine culture did not show any growth Procalcitonin low -Antibiotics now discontinued  Hypokalemia Replaced   Pleural effusion: CT chest shows bilateral pleural effusion It does not seem she needs thoracocentesis. -Overall effusions have improved with diuresis   Orthostatic hypotension: Continue midodrine.   Essential tremor: Continue on propranolol  AKI -Likely related to diuresis and hypotension -Overall creatinine has returned to baseline with holding diuretics.   DVT prophylaxis:Eliquis Code Status: DNR Family Communication: Discussed with daughter at bedside on 10/29 Disposition Plan:  Status is: Inpatient  Remains inpatient appropriate because: Needs IV medications.  Consultants:  Cardiology Pulmonology  Procedures:  See below  Antimicrobials:  Anti-infectives (From admission, onward)    Start     Dose/Rate Route Frequency Ordered Stop   01/01/21 1600  vancomycin (VANCOCIN) IVPB 1000 mg/200 mL premix  Status:  Discontinued        1,000 mg 200 mL/hr over 60 Minutes Intravenous Every 24 hours  12/31/20 1503 01/01/21 0752   01/01/21 1000  cefTRIAXone (ROCEPHIN) 1 g in sodium chloride 0.9 % 100 mL IVPB        1  g 200 mL/hr over 30 Minutes Intravenous Every 24 hours 01/01/21 0752 01/04/21 1257   12/31/20 2200  ceFEPIme (MAXIPIME) 2 g in sodium chloride 0.9 % 100 mL IVPB  Status:  Discontinued        2 g 200 mL/hr over 30 Minutes Intravenous Every 12 hours 12/31/20 1728 01/01/21 0752   12/31/20 1515  ceFEPIme (MAXIPIME) 2 g in sodium chloride 0.9 % 100 mL IVPB        2 g 200 mL/hr over 30 Minutes Intravenous  Once 12/31/20 1500 12/31/20 1538   12/31/20 1515  vancomycin (VANCOREADY) IVPB 1750 mg/350 mL        1,750 mg 175 mL/hr over 120 Minutes Intravenous  Once 12/31/20 1501 12/31/20 1749   12/31/20 1500  ceFEPIme (MAXIPIME) 1 g in sodium chloride 0.9 % 100 mL IVPB  Status:  Discontinued        1 g 200 mL/hr over 30 Minutes Intravenous  Once 12/31/20 1456 12/31/20 1500      Subjective: Reports no significant change in breathing.  She did describe neck pain which has been intermittently present for few months now.  Objective: Vitals:   01/05/21 1500 01/05/21 1600 01/05/21 1615 01/05/21 1700  BP: 116/78 124/64  122/66  Pulse: 71 71 70 68  Resp: (!) 26 (!) 22 (!) 23 (!) 24  Temp:      TempSrc:      SpO2: 100% 100% 98% 100%  Weight:      Height:        Intake/Output Summary (Last 24 hours) at 01/05/2021 1940 Last data filed at 01/05/2021 1637 Gross per 24 hour  Intake 240 ml  Output 2675 ml  Net -2435 ml   Filed Weights   12/31/20 1252 01/02/21 0542  Weight: 83.5 kg 87.4 kg    Examination:  General exam: Alert, awake, oriented x 3 Respiratory system: Clear to auscultation. Respiratory effort normal. Cardiovascular system:RRR. No murmurs, rubs, gallops. Gastrointestinal system: Abdomen is nondistended, soft and nontender. No organomegaly or masses felt. Normal bowel sounds heard. Central nervous system: Alert and oriented. No focal neurological deficits. Extremities: No C/C/E, +pedal pulses Skin: No rashes, lesions or ulcers Psychiatry: Judgement and insight appear normal.  Mood & affect appropriate.     Data Reviewed: I have personally reviewed following labs and imaging studies  CBC: Recent Labs  Lab 12/31/20 1506 01/01/21 0503 01/02/21 0451  WBC 12.3* 11.1* 10.2  NEUTROABS 7.1  --   --   HGB 10.9* 10.1* 9.5*  HCT 36.9 34.7* 33.1*  MCV 89.1 90.4 89.0  PLT 602* 549* 572*   Basic Metabolic Panel: Recent Labs  Lab 12/31/20 1333 01/01/21 0503 01/02/21 0451 01/03/21 0427 01/04/21 0405 01/04/21 1523 01/05/21 0443  NA 137 137 135 132* 131*  --  133*  K 3.7 3.4* 3.8 3.9 3.0*  --  4.2  CL 107 103 101 95* 97*  --  101  CO2 _0 --  27  GLUCOSE 99 90 105* 116* 110*  --  130*  BUN _1 27* 22  --  20  CREATININE 0.67 0.74 0.96 1.38* 0.89  --  0.69  CALCIUM 8.2* 8.4* 8.7* 8.8* 8.5*  --  8.9  MG 2.0  --  1.9 2.2  --  2.0  --  GFR: Estimated Creatinine Clearance: 58.2 mL/min (by C-G formula based on SCr of 0.69 mg/dL). Liver Function Tests: Recent Labs  Lab 12/31/20 1333 01/01/21 0503  AST 28 22  ALT 12 10  ALKPHOS 79 74  BILITOT 0.8 0.6  PROT 6.4* 6.1*  ALBUMIN 3.0* 2.7*   No results for input(s): LIPASE, AMYLASE in the last 168 hours. No results for input(s): AMMONIA in the last 168 hours. Coagulation Profile: Recent Labs  Lab 01/01/21 0503  INR 1.9*   Cardiac Enzymes: No results for input(s): CKTOTAL, CKMB, CKMBINDEX, TROPONINI in the last 168 hours. BNP (last 3 results) Recent Labs    12/05/20 1636  PROBNP 277.0*   HbA1C: No results for input(s): HGBA1C in the last 72 hours. CBG: No results for input(s): GLUCAP in the last 168 hours. Lipid Profile: No results for input(s): CHOL, HDL, LDLCALC, TRIG, CHOLHDL, LDLDIRECT in the last 72 hours. Thyroid Function Tests: No results for input(s): TSH, T4TOTAL, FREET4, T3FREE, THYROIDAB in the last 72 hours. Anemia Panel: No results for input(s): VITAMINB12, FOLATE, FERRITIN, TIBC, IRON, RETICCTPCT in the last 72 hours. Sepsis Labs: Recent Labs  Lab  12/31/20 1334 12/31/20 1506 01/01/21 0503  PROCALCITON  --   --  <0.10  LATICACIDVEN 1.6 1.7  --     Recent Results (from the past 240 hour(s))  Resp Panel by RT-PCR (Flu A&B, Covid) Nasopharyngeal Swab     Status: None   Collection Time: 12/27/20  5:51 PM   Specimen: Nasopharyngeal Swab; Nasopharyngeal(NP) swabs in vial transport medium  Result Value Ref Range Status   SARS Coronavirus 2 by RT PCR NEGATIVE NEGATIVE Final    Comment: (NOTE) SARS-CoV-2 target nucleic acids are NOT DETECTED.  The SARS-CoV-2 RNA is generally detectable in upper respiratory specimens during the acute phase of infection. The lowest concentration of SARS-CoV-2 viral copies this assay can detect is 138 copies/mL. A negative result does not preclude SARS-Cov-2 infection and should not be used as the sole basis for treatment or other patient management decisions. A negative result may occur with  improper specimen collection/handling, submission of specimen other than nasopharyngeal swab, presence of viral mutation(s) within the areas targeted by this assay, and inadequate number of viral copies(<138 copies/mL). A negative result must be combined with clinical observations, patient history, and epidemiological information. The expected result is Negative.  Fact Sheet for Patients:  EntrepreneurPulse.com.au  Fact Sheet for Healthcare Providers:  IncredibleEmployment.be  This test is no t yet approved or cleared by the [place] FDA and  has been authorized for detection and/or diagnosis of SARS-CoV-2 by FDA under an Emergency Use Authorization (EUA). This EUA will remain  in effect (meaning this test can be used) for the duration of the COVID-19 declaration under Section 564(b)(1) of the Act, 21 U.S.C.section 360bbb-3(b)(1), unless the authorization is terminated  or revoked sooner.       Influenza A by PCR NEGATIVE NEGATIVE Final   Influenza B by PCR  NEGATIVE NEGATIVE Final    Comment: (NOTE) The Xpert Xpress SARS-CoV-2/FLU/RSV plus assay is intended as an aid in the diagnosis of influenza from Nasopharyngeal swab specimens and should not be used as a sole basis for treatment. Nasal washings and aspirates are unacceptable for Xpert Xpress SARS-CoV-2/FLU/RSV testing.  Fact Sheet for Patients: EntrepreneurPulse.com.au  Fact Sheet for Healthcare Providers: IncredibleEmployment.be  This test is not yet approved or cleared by the [place] FDA and has been authorized for detection and/or diagnosis of SARS-CoV-2 by FDA under an  Emergency Use Authorization (EUA). This EUA will remain in effect (meaning this test can be used) for the duration of the COVID-19 declaration under Section 564(b)(1) of the Act, 21 U.S.C. section 360bbb-3(b)(1), unless the authorization is terminated or revoked.  Performed at [facility], [address] [place], [place] [address]   Resp Panel by RT-PCR (Flu A&B, Covid) Nasopharyngeal Swab     Status: None   Collection Time: 12/31/20  1:26 PM   Specimen: Nasopharyngeal Swab; Nasopharyngeal(NP) swabs in vial transport medium  Result Value Ref Range Status   SARS Coronavirus 2 by RT PCR NEGATIVE NEGATIVE Final    Comment: (NOTE) SARS-CoV-2 target nucleic acids are NOT DETECTED.  The SARS-CoV-2 RNA is generally detectable in upper respiratory specimens during the acute phase of infection. The lowest concentration of SARS-CoV-2 viral copies this assay can detect is 138 copies/mL. A negative result does not preclude SARS-Cov-2 infection and should not be used as the sole basis for treatment or other patient management decisions. A negative result may occur with  improper specimen collection/handling, submission of specimen other than nasopharyngeal swab, presence of viral mutation(s) within the areas targeted by this assay, and inadequate number of viral copies(<138  copies/mL). A negative result must be combined with clinical observations, patient history, and epidemiological information. The expected result is Negative.  Fact Sheet for Patients:  EntrepreneurPulse.com.au  Fact Sheet for Healthcare Providers:  IncredibleEmployment.be  This test is no t yet approved or cleared by the [place] FDA and  has been authorized for detection and/or diagnosis of SARS-CoV-2 by FDA under an Emergency Use Authorization (EUA). This EUA will remain  in effect (meaning this test can be used) for the duration of the COVID-19 declaration under Section 564(b)(1) of the Act, 21 U.S.C.section 360bbb-3(b)(1), unless the authorization is terminated  or revoked sooner.       Influenza A by PCR NEGATIVE NEGATIVE Final   Influenza B by PCR NEGATIVE NEGATIVE Final    Comment: (NOTE) The Xpert Xpress SARS-CoV-2/FLU/RSV plus assay is intended as an aid in the diagnosis of influenza from Nasopharyngeal swab specimens and should not be used as a sole basis for treatment. Nasal washings and aspirates are unacceptable for Xpert Xpress SARS-CoV-2/FLU/RSV testing.  Fact Sheet for Patients: EntrepreneurPulse.com.au  Fact Sheet for Healthcare Providers: IncredibleEmployment.be  This test is not yet approved or cleared by the [place] FDA and has been authorized for detection and/or diagnosis of SARS-CoV-2 by FDA under an Emergency Use Authorization (EUA). This EUA will remain in effect (meaning this test can be used) for the duration of the COVID-19 declaration under Section 564(b)(1) of the Act, 21 U.S.C. section 360bbb-3(b)(1), unless the authorization is terminated or revoked.  Performed at [facility], [address], [place], [place] [address]   Blood culture (routine x 2)     Status: None   Collection Time: 12/31/20  1:43 PM   Specimen: Right Antecubital; Blood  Result Value Ref  Range Status   Specimen Description RIGHT ANTECUBITAL  Final   Special Requests   Final    BOTTLES DRAWN AEROBIC AND ANAEROBIC Blood Culture results may not be optimal due to an inadequate volume of blood received in culture bottles   Culture   Final    NO GROWTH 5 DAYS Performed at [facility], [address] [place], [place] [address]    Report Status 01/05/2021 FINAL  Final  Blood culture (routine x 2)     Status: None   Collection Time:  12/31/20  1:46 PM   Specimen: Left Antecubital; Blood  Result Value Ref Range Status   Specimen Description LEFT ANTECUBITAL  Final   Special Requests   Final    BOTTLES DRAWN AEROBIC AND ANAEROBIC Blood Culture adequate volume   Culture   Final    NO GROWTH 5 DAYS Performed at [facility], [address], [place], [place] [address]    Report Status 01/05/2021 FINAL  Final  MRSA Next Gen by PCR, Nasal     Status: None   Collection Time: 12/31/20  4:29 PM   Specimen: Nasal Mucosa; Nasal Swab  Result Value Ref Range Status   MRSA by PCR Next Gen NOT DETECTED NOT DETECTED Final    Comment: (NOTE) The GeneXpert MRSA Assay (FDA approved for NASAL specimens only), is one component of a comprehensive MRSA colonization surveillance program. It is not intended to diagnose MRSA infection nor to guide or monitor treatment for MRSA infections. Test performance is not FDA approved in patients less than 71 years old. Performed at [facility], [address], [place], [place] [address]   Urine Culture     Status: None   Collection Time: 01/01/21 10:15 AM   Specimen: Urine, Clean Catch  Result Value Ref Range Status   Specimen Description   Final    URINE, CLEAN CATCH Performed at [facility], [address], [place], [place] [address]    Special Requests   Final    NONE Performed at [facility], [address], [place], [place] [address]    Culture   Final    NO GROWTH Performed at [facility], [place] [address], [place], [place] [address]     Report Status 01/02/2021 FINAL  Final         Radiology Studies: No results found.      Scheduled Meds:  acidophilus  1 capsule Oral Daily   amiodarone  200 mg Oral BID   apixaban  5 mg Oral BID   Chlorhexidine Gluconate Cloth  6 each Topical Daily   cholecalciferol  1,000 Units Oral Daily   docusate sodium  100 mg Oral BID   furosemide  40 mg Oral Daily   gabapentin  200 mg Oral TID   melatonin  9 mg Oral QHS   midodrine  5 mg Oral TID WC   pantoprazole  40 mg Oral Daily   predniSONE  40 mg Oral Q breakfast   sucralfate  1 g Oral QHS   Continuous Infusions:  sodium chloride Stopped (01/04/21 1346)     LOS: 5 days    Time spent: 35 minutes    Kathie Dike, MD [facility]  If 7PM-7AM, please contact night-coverage [email or web address] 01/05/2021, 7:40 PM

## 2021-01-05 NOTE — Evaluation (Signed)
Physical Therapy Evaluation Patient Details Name: Monique Robles MRN: [id number] DOB: [birth year] Today's Date: 01/05/2021  History of Present Illness  Monique Robles is a 80 y.o. female with PMH significant of paroxysmal A. fib on Eliquis, diastolic CHF with the last LVEF 65 to 70%, GERD, history of pulmonary embolism, history of stroke, hypertension, hyperlipidemia, vitamin D deficiency, essential tremor presented to the ED with worsening shortness of breath.  Patient was seen in the ED last week and found to have A. fib with RVR.  Patient underwent successful cardioversion into normal sinus rhythm and patient was discharged to rehab.  Patient has been admitted with recurrent atrial fibrillation with RVR with associated acute on chronic hypoxemic respiratory failure in the setting of CHF. She also has a UTI.   Clinical Impression     Patient in bed and agreeable to therapy despite generalized fatigue. Able to perform all bed mobilities with moderate assist with head of bed elevated secondary to neck pain when patient flat. With transitioning to standing patient only required min assist but immediately started urinating. Performed multiple sit to stands, on 4th rep patient abole to stand in place but very tired and unable to march in place. Difficult for patient to scoot back in bad, required moderate to max assist transitioning back into bed. Bed linens soiled, notified nursing who was planning on bathing patient.  Patient will continue to benefit from skilled physical therapy in hospital and recommended venue below to increase strength, balance, endurance for safe ADLs and gait.    Recommendations for follow up therapy are one component of a multi-disciplinary discharge planning process, led by the attending physician.  Recommendations may be updated based on patient status, additional functional criteria and insurance authorization.  Follow Up Recommendations Skilled nursing-short term rehab  (<3 hours/day)    Assistance Recommended at Discharge Frequent or constant Supervision/Assistance  Functional Status Assessment    Equipment Recommendations  BSC    Recommendations for Other Services       Precautions / Restrictions Precautions Precautions: Fall Restrictions Weight Bearing Restrictions: No      Mobility  Bed Mobility Overal bed mobility: Needs Assistance Bed Mobility: Supine to Sit;Sit to Supine     Supine to sit: HOB elevated;Mod assist Sit to supine: Mod assist;Max assist   General bed mobility comments: slow labored movements. HOB elevated for transfer to sitting edge of bed secondary to neck pain with bed flat, able to transition to edge of bed with mod assist. Transitioning back to bed patient required mod/max assist to scoot back into bed    Transfers Overall transfer level: Needs assistance   Transfers: Sit to/from Stand Sit to Stand: Min assist           General transfer comment: verbal cues and min assist, slow labored movement, urge to urinate with transition to standing    Ambulation/Gait             General Gait Details: unable at this time  Stairs            Wheelchair Mobility    Modified Rankin (Stroke Patients Only)       Balance Overall balance assessment: Needs assistance Sitting-balance support: Feet supported Sitting balance-Leahy Scale: Fair     Standing balance support: Bilateral upper extremity supported Standing balance-Leahy Scale: Poor  Pertinent Vitals/Pain Pain Assessment: 0-10 Pain Score: 8  Pain Location: neck Pain Descriptors / Indicators: Aching Pain Intervention(s): Monitored during session    Home Living Family/patient expects to be discharged to:: Private residence Living Arrangements: Children Available Help at Discharge: Family;Available PRN/intermittently Type of Home: House Home Access: Stairs to enter Entrance Stairs-Rails:  Right;Left Entrance Stairs-Number of Steps: 4-5 Alternate Level Stairs-Number of Steps: ~15, 7 landing + 7 Home Layout: Two level;Able to live on main level with bedroom/bathroom Home Equipment: Rolling Walker (2 wheels);Grab bars - tub/shower;Shower seat      Prior Function Prior Level of Function : Needs assist       Physical Assist : ADLs (physical)     Mobility Comments: RW, homebound ADLs Comments: Family assist with ADLs     Hand Dominance        Extremity/Trunk Assessment        Lower Extremity Assessment Lower Extremity Assessment: Generalized weakness    Cervical / Trunk Assessment Cervical / Trunk Assessment: Kyphotic  Communication   Communication: No difficulties  Cognition Arousal/Alertness: Awake/alert Behavior During Therapy: WFL for tasks assessed/performed                                            General Comments      Exercises General Exercises - Lower Extremity Ankle Circles/Pumps: AROM;Both;Supine;10 reps Long Arc Quad: AROM;Seated;Both;10 reps Straight Leg Raises: AROM;Both;5 reps;Supine Hip Flexion/Marching:  (min assist)   Assessment/Plan    PT Assessment Patient needs continued PT services  PT Problem List Decreased strength;Decreased range of motion;Decreased activity tolerance;Decreased balance;Decreased mobility;Pain;Decreased knowledge of use of DME       PT Treatment Interventions      PT Goals (Current goals can be found in the Care Plan section)  Acute Rehab PT Goals Patient Stated Goal: to go home PT Goal Formulation: With patient Time For Goal Achievement: 01/19/21 Potential to Achieve Goals: Fair    Frequency     Barriers to discharge Inaccessible home environment;Decreased caregiver support needs mod/max assist with transfers and bed mobilities, currently family only home PRN    Co-evaluation               AM-PAC PT "6 Clicks" Mobility  Outcome Measure Help needed turning from your  back to your side while in a flat bed without using bedrails?: A Lot Help needed moving from lying on your back to sitting on the side of a flat bed without using bedrails?: A Lot Help needed moving to and from a bed to a chair (including a wheelchair)?: Total Help needed standing up from a chair using your arms (e.g., wheelchair or bedside chair)?: A Little Help needed to walk in hospital room?: Total Help needed climbing 3-5 steps with a railing? : Total 6 Click Score: 10    End of Session Equipment Utilized During Treatment: Gait belt Activity Tolerance: Patient limited by fatigue Patient left: in bed;with call bell/phone within reach Nurse Communication: Mobility status;Other (comment) (of soiled bed) PT Visit Diagnosis: Unsteadiness on feet (R26.81);Muscle weakness (generalized) (M62.81);Difficulty in walking, not elsewhere classified (R26.2);History of falling (Z91.81)    Time: 5176-1607 PT Time Calculation (min) (ACUTE ONLY): 35 min   Charges:   PT Evaluation $PT Eval Moderate Complexity: 1 Mod PT Treatments $Therapeutic Exercise: 8-22 mins       10:33 AM, 01/05/21 [name], DPT Physical Therapy  with [facility]  [phone] office

## 2021-01-06 DIAGNOSIS — J9601 Acute respiratory failure with hypoxia: Secondary | ICD-10-CM | POA: Diagnosis not present

## 2021-01-06 DIAGNOSIS — J984 Other disorders of lung: Secondary | ICD-10-CM | POA: Diagnosis not present

## 2021-01-06 DIAGNOSIS — J9611 Chronic respiratory failure with hypoxia: Secondary | ICD-10-CM | POA: Diagnosis not present

## 2021-01-06 DIAGNOSIS — I5030 Unspecified diastolic (congestive) heart failure: Secondary | ICD-10-CM | POA: Diagnosis not present

## 2021-01-06 LAB — BASIC METABOLIC PANEL
Anion gap: 10 (ref 5–15)
BUN: 23 mg/dL (ref 8–23)
CO2: 26 mmol/L (ref 22–32)
Calcium: 9 mg/dL (ref 8.9–10.3)
Chloride: 97 mmol/L — ABNORMAL LOW (ref 98–111)
Creatinine, Ser: 0.75 mg/dL (ref 0.44–1.00)
GFR, Estimated: >60 mL/min (ref >60)
Glucose, Bld: 141 mg/dL — ABNORMAL HIGH (ref 70–99)
Potassium: 4 mmol/L (ref 3.5–5.1)
Sodium: 133 mmol/L — ABNORMAL LOW (ref 135–145)

## 2021-01-06 NOTE — Progress Notes (Signed)
PROGRESS NOTE    Monique Robles  MHD:[id number] DOB: [birth year] DOA: 12/31/2020 PCP: Celene Squibb, MD   Brief Narrative:   Monique Robles is a 80 y.o. female with PMH significant of paroxysmal A. fib on Eliquis, diastolic CHF with the last LVEF 65 to 70%, GERD, history of pulmonary embolism, history of stroke, hypertension, hyperlipidemia, vitamin D deficiency, essential tremor presented to the ED with worsening shortness of breath.  Patient was seen in the ED last week and found to have A. fib with RVR.  Patient underwent successful cardioversion into normal sinus rhythm and patient was discharged to rehab.  Patient has been admitted with recurrent atrial fibrillation with RVR with associated acute on chronic hypoxemic respiratory failure in the setting of CHF. She also has a UTI.  Assessment & Plan:   Principal Problem:   Severe sepsis ([place]) Active Problems:   Essential tremor   Overactive bladder   GERD (gastroesophageal reflux disease)   Insomnia   Iron deficiency anemia due to chronic blood loss   Chronic lung disease   Chronic respiratory failure with hypoxia (HCC)   Recurrent A. fib with RVR: Patient presented with increasing shortness of breath associated with palpitations. She was found to have A. fib with RVR, heart rate 140- 146. Restarted home propranolol, and started on amiodarone infusion as well as Cardizem infusion Continue Eliquis. Last echocardiogram EF 65 to 70%.  Mild aortic stenosis. -Amiodarone/Cardizem held on 10/26 since she became hypotensive, possibly related to overdiuresis -Patient underwent DCCV on 10/28 -Currently in sinus rhythm -She is continued on oral amiodarone -Continue to monitor for now   Acute on chronic hypoxic respiratory failure  Patient was recently discharged to rehab on 2 L of supplemental oxygen. She remained on 2 L until oxygen had to be titrated up to NRB on 10/25 Oxygen has since been weaned back down to 2 L Supplemental  oxygen to keep saturation above 90%. -Etiology of respiratory failure felt to be multifactorial, related to CHF, atrial fibrillation, pulmonary fibrosis  Acute on chronic diastolic congestive heart failure -Cardiology following -She was being diuresed with IV Lasix -Overall net volume status has been negative -Creatinine had bumped up and IV diuretics were held -Currently on oral diuretics -Renal function has been stable  Pulmonary fibrosis related to nitrofurantoin -Patchy findings on chest imaging worrisome for underlying infection versus ILD -Appreciate pulmonology input -She completed a course of antibiotics. -ESR elevated and she is been started on a prolonged course of prednisone  UTI Discontinued cefepime and vancomycin, switched to Rocephin Urine culture did not show any growth Procalcitonin low -Antibiotics now discontinued  Hypokalemia Replaced   Pleural effusion: CT chest shows bilateral pleural effusion It does not seem she needs thoracocentesis. -Overall effusions have improved with diuresis   Orthostatic hypotension: Continue midodrine.   Essential tremor: Was previously on propranolol, now held due to bradycardia -Can consider low-dose primidone  AKI -Likely related to diuresis and hypotension -Overall creatinine has returned to baseline with holding diuretics. -Renal function has been stable with oral diuretics   DVT prophylaxis:Eliquis Code Status: DNR Family Communication: Discussed with daughter at bedside on 10/30 Disposition Plan:  Status is: Inpatient  Remains inpatient appropriate because: Needs reevaluation by pulmonology/cardiology prior to discharge back to skilled nursing facility  Consultants:  Cardiology Pulmonology  Procedures:  See below  Antimicrobials:  Anti-infectives (From admission, onward)    Start     Dose/Rate Route Frequency Ordered Stop   01/01/21 1600  vancomycin (VANCOCIN)  IVPB 1000 mg/200 mL premix  Status:   Discontinued        1,000 mg 200 mL/hr over 60 Minutes Intravenous Every 24 hours 12/31/20 1503 01/01/21 0752   01/01/21 1000  cefTRIAXone (ROCEPHIN) 1 g in sodium chloride 0.9 % 100 mL IVPB        1 g 200 mL/hr over 30 Minutes Intravenous Every 24 hours 01/01/21 0752 01/04/21 1257   12/31/20 2200  ceFEPIme (MAXIPIME) 2 g in sodium chloride 0.9 % 100 mL IVPB  Status:  Discontinued        2 g 200 mL/hr over 30 Minutes Intravenous Every 12 hours 12/31/20 1728 01/01/21 0752   12/31/20 1515  ceFEPIme (MAXIPIME) 2 g in sodium chloride 0.9 % 100 mL IVPB        2 g 200 mL/hr over 30 Minutes Intravenous  Once 12/31/20 1500 12/31/20 1538   12/31/20 1515  vancomycin (VANCOREADY) IVPB 1750 mg/350 mL        1,750 mg 175 mL/hr over 120 Minutes Intravenous  Once 12/31/20 1501 12/31/20 1749   12/31/20 1500  ceFEPIme (MAXIPIME) 1 g in sodium chloride 0.9 % 100 mL IVPB  Status:  Discontinued        1 g 200 mL/hr over 30 Minutes Intravenous  Once 12/31/20 1456 12/31/20 1500      Subjective: Denies any worsening shortness of breath.  No chest pain.  Feels that her neck pain is a little better with Flexeril.  Objective: Vitals:   01/06/21 1900 01/06/21 1936 01/06/21 1950 01/06/21 2008  BP: 126/63     Pulse: (!) 55 (!) 55    Resp: (!) 23 19    Temp:    97.6 F (36.4 C)  TempSrc:    Oral  SpO2: 100% 100% 100%   Weight:      Height:        Intake/Output Summary (Last 24 hours) at 01/06/2021 2048 Last data filed at 01/06/2021 1829 Gross per 24 hour  Intake --  Output 1650 ml  Net -1650 ml   Filed Weights   12/31/20 1252 01/02/21 0542  Weight: 83.5 kg 87.4 kg    Examination:  General exam: Alert, awake, oriented x 3 Respiratory system: Clear to auscultation. Respiratory effort normal. Cardiovascular system:RRR. No murmurs, rubs, gallops. Gastrointestinal system: Abdomen is nondistended, soft and nontender. No organomegaly or masses felt. Normal bowel sounds heard. Central nervous  system: Alert and oriented. No focal neurological deficits. Extremities: No C/C/E, +pedal pulses Skin: No rashes, lesions or ulcers Psychiatry: Judgement and insight appear normal. Mood & affect appropriate.     Data Reviewed: I have personally reviewed following labs and imaging studies  CBC: Recent Labs  Lab 12/31/20 1506 01/01/21 0503 01/02/21 0451  WBC 12.3* 11.1* 10.2  NEUTROABS 7.1  --   --   HGB 10.9* 10.1* 9.5*  HCT 36.9 34.7* 33.1*  MCV 89.1 90.4 89.0  PLT 602* 549* 811*   Basic Metabolic Panel: Recent Labs  Lab 12/31/20 1333 01/01/21 0503 01/02/21 0451 01/03/21 0427 01/04/21 0405 01/04/21 1523 01/05/21 0443 01/06/21 0336  NA 137   < > 135 132* 131*  --  133* 133*  K 3.7   < > 3.8 3.9 3.0*  --  4.2 4.0  CL 107   < > 101 95* 97*  --  101 97*  CO2 24   < > _0 --  27 26  GLUCOSE 99   < > 105* 116* 110*  --  130* 141*  BUN 16   < > 21 27* 22  --  20 23  CREATININE 0.67   < > 0.96 1.38* 0.89  --  0.69 0.75  CALCIUM 8.2*   < > 8.7* 8.8* 8.5*  --  8.9 9.0  MG 2.0  --  1.9 2.2  --  2.0  --   --    < > = values in this interval not displayed.   GFR: Estimated Creatinine Clearance: 58.2 mL/min (by C-G formula based on SCr of 0.75 mg/dL). Liver Function Tests: Recent Labs  Lab 12/31/20 1333 01/01/21 0503  AST 28 22  ALT 12 10  ALKPHOS 79 74  BILITOT 0.8 0.6  PROT 6.4* 6.1*  ALBUMIN 3.0* 2.7*   No results for input(s): LIPASE, AMYLASE in the last 168 hours. No results for input(s): AMMONIA in the last 168 hours. Coagulation Profile: Recent Labs  Lab 01/01/21 0503  INR 1.9*   Cardiac Enzymes: No results for input(s): CKTOTAL, CKMB, CKMBINDEX, TROPONINI in the last 168 hours. BNP (last 3 results) Recent Labs    12/05/20 1636  PROBNP 277.0*   HbA1C: No results for input(s): HGBA1C in the last 72 hours. CBG: No results for input(s): GLUCAP in the last 168 hours. Lipid Profile: No results for input(s): CHOL, HDL, LDLCALC, TRIG, CHOLHDL,  LDLDIRECT in the last 72 hours. Thyroid Function Tests: No results for input(s): TSH, T4TOTAL, FREET4, T3FREE, THYROIDAB in the last 72 hours. Anemia Panel: No results for input(s): VITAMINB12, FOLATE, FERRITIN, TIBC, IRON, RETICCTPCT in the last 72 hours. Sepsis Labs: Recent Labs  Lab 12/31/20 1334 12/31/20 1506 01/01/21 0503  PROCALCITON  --   --  <0.10  LATICACIDVEN 1.6 1.7  --     Recent Results (from the past 240 hour(s))  Resp Panel by RT-PCR (Flu A&B, Covid) Nasopharyngeal Swab     Status: None   Collection Time: 12/31/20  1:26 PM   Specimen: Nasopharyngeal Swab; Nasopharyngeal(NP) swabs in vial transport medium  Result Value Ref Range Status   SARS Coronavirus 2 by RT PCR NEGATIVE NEGATIVE Final    Comment: (NOTE) SARS-CoV-2 target nucleic acids are NOT DETECTED.  The SARS-CoV-2 RNA is generally detectable in upper respiratory specimens during the acute phase of infection. The lowest concentration of SARS-CoV-2 viral copies this assay can detect is 138 copies/mL. A negative result does not preclude SARS-Cov-2 infection and should not be used as the sole basis for treatment or other patient management decisions. A negative result may occur with  improper specimen collection/handling, submission of specimen other than nasopharyngeal swab, presence of viral mutation(s) within the areas targeted by this assay, and inadequate number of viral copies(<138 copies/mL). A negative result must be combined with clinical observations, patient history, and epidemiological information. The expected result is Negative.  Fact Sheet for Patients:  EntrepreneurPulse.com.au  Fact Sheet for Healthcare Providers:  IncredibleEmployment.be  This test is no t yet approved or cleared by the [place] FDA and  has been authorized for detection and/or diagnosis of SARS-CoV-2 by FDA under an Emergency Use Authorization (EUA). This EUA will remain  in  effect (meaning this test can be used) for the duration of the COVID-19 declaration under Section 564(b)(1) of the Act, 21 U.S.C.section 360bbb-3(b)(1), unless the authorization is terminated  or revoked sooner.       Influenza A by PCR NEGATIVE NEGATIVE Final   Influenza B by PCR NEGATIVE NEGATIVE Final    Comment: (NOTE) The Xpert Xpress SARS-CoV-2/FLU/RSV plus  assay is intended as an aid in the diagnosis of influenza from Nasopharyngeal swab specimens and should not be used as a sole basis for treatment. Nasal washings and aspirates are unacceptable for Xpert Xpress SARS-CoV-2/FLU/RSV testing.  Fact Sheet for Patients: EntrepreneurPulse.com.au  Fact Sheet for Healthcare Providers: IncredibleEmployment.be  This test is not yet approved or cleared by the [place] FDA and has been authorized for detection and/or diagnosis of SARS-CoV-2 by FDA under an Emergency Use Authorization (EUA). This EUA will remain in effect (meaning this test can be used) for the duration of the COVID-19 declaration under Section 564(b)(1) of the Act, 21 U.S.C. section 360bbb-3(b)(1), unless the authorization is terminated or revoked.  Performed at [facility], [address] [place], [place] [address]   Blood culture (routine x 2)     Status: None   Collection Time: 12/31/20  1:43 PM   Specimen: Right Antecubital; Blood  Result Value Ref Range Status   Specimen Description RIGHT ANTECUBITAL  Final   Special Requests   Final    BOTTLES DRAWN AEROBIC AND ANAEROBIC Blood Culture results may not be optimal due to an inadequate volume of blood received in culture bottles   Culture   Final    NO GROWTH 5 DAYS Performed at [facility], [address] [place], [place] [address]    Report Status 01/05/2021 FINAL  Final  Blood culture (routine x 2)     Status: None   Collection Time: 12/31/20  1:46 PM   Specimen: Left Antecubital; Blood  Result Value Ref Range  Status   Specimen Description LEFT ANTECUBITAL  Final   Special Requests   Final    BOTTLES DRAWN AEROBIC AND ANAEROBIC Blood Culture adequate volume   Culture   Final    NO GROWTH 5 DAYS Performed at [facility], [address], [place], [place] [address]    Report Status 01/05/2021 FINAL  Final  MRSA Next Gen by PCR, Nasal     Status: None   Collection Time: 12/31/20  4:29 PM   Specimen: Nasal Mucosa; Nasal Swab  Result Value Ref Range Status   MRSA by PCR Next Gen NOT DETECTED NOT DETECTED Final    Comment: (NOTE) The GeneXpert MRSA Assay (FDA approved for NASAL specimens only), is one component of a comprehensive MRSA colonization surveillance program. It is not intended to diagnose MRSA infection nor to guide or monitor treatment for MRSA infections. Test performance is not FDA approved in patients less than 14 years old. Performed at [facility], [address] [place], [place] [address]   Urine Culture     Status: None   Collection Time: 01/01/21 10:15 AM   Specimen: Urine, Clean Catch  Result Value Ref Range Status   Specimen Description   Final    URINE, CLEAN CATCH Performed at [facility], [address], [place], [place] [address]    Special Requests   Final    NONE Performed at [facility], [address], [place], [place] [address]    Culture   Final    NO GROWTH Performed at [facility], [place] [address], [place], [place] [address]    Report Status 01/02/2021 FINAL  Final         Radiology Studies: No results found.      Scheduled Meds:  acidophilus  1 capsule Oral Daily   amiodarone  200 mg Oral BID   apixaban  5 mg Oral BID   Chlorhexidine Gluconate Cloth  6 each Topical Daily   cholecalciferol  1,000 Units Oral Daily   docusate sodium  100 mg Oral BID   furosemide  40 mg Oral Daily   gabapentin  200 mg Oral TID   melatonin  9 mg Oral QHS   midodrine  5 mg Oral TID WC   pantoprazole  40 mg Oral Daily   predniSONE  40 mg Oral Q  breakfast   sucralfate  1 g Oral QHS   Continuous Infusions:  sodium chloride Stopped (01/04/21 1346)     LOS: 6 days    Time spent: 35 minutes    Kathie Dike, MD [facility]  If 7PM-7AM, please contact night-coverage [email or web address] 01/06/2021, 8:48 PM

## 2021-01-07 ENCOUNTER — Encounter (HOSPITAL_COMMUNITY): Payer: Self-pay | Admitting: Cardiology

## 2021-01-07 DIAGNOSIS — G250 Essential tremor: Secondary | ICD-10-CM | POA: Diagnosis not present

## 2021-01-07 DIAGNOSIS — A419 Sepsis, unspecified organism: Secondary | ICD-10-CM | POA: Diagnosis not present

## 2021-01-07 DIAGNOSIS — D500 Iron deficiency anemia secondary to blood loss (chronic): Secondary | ICD-10-CM | POA: Diagnosis not present

## 2021-01-07 DIAGNOSIS — T50905A Adverse effect of unspecified drugs, medicaments and biological substances, initial encounter: Secondary | ICD-10-CM | POA: Diagnosis not present

## 2021-01-07 DIAGNOSIS — J189 Pneumonia, unspecified organism: Secondary | ICD-10-CM | POA: Diagnosis not present

## 2021-01-07 DIAGNOSIS — I480 Paroxysmal atrial fibrillation: Secondary | ICD-10-CM | POA: Diagnosis not present

## 2021-01-07 DIAGNOSIS — Z7901 Long term (current) use of anticoagulants: Secondary | ICD-10-CM | POA: Diagnosis not present

## 2021-01-07 DIAGNOSIS — I4819 Other persistent atrial fibrillation: Secondary | ICD-10-CM | POA: Diagnosis not present

## 2021-01-07 DIAGNOSIS — K219 Gastro-esophageal reflux disease without esophagitis: Secondary | ICD-10-CM | POA: Diagnosis not present

## 2021-01-07 DIAGNOSIS — A4189 Other specified sepsis: Secondary | ICD-10-CM | POA: Diagnosis not present

## 2021-01-07 DIAGNOSIS — Z96652 Presence of left artificial knee joint: Secondary | ICD-10-CM | POA: Diagnosis not present

## 2021-01-07 DIAGNOSIS — J984 Other disorders of lung: Secondary | ICD-10-CM | POA: Diagnosis not present

## 2021-01-07 DIAGNOSIS — R609 Edema, unspecified: Secondary | ICD-10-CM | POA: Diagnosis not present

## 2021-01-07 DIAGNOSIS — I4891 Unspecified atrial fibrillation: Secondary | ICD-10-CM | POA: Diagnosis not present

## 2021-01-07 DIAGNOSIS — Z9981 Dependence on supplemental oxygen: Secondary | ICD-10-CM | POA: Diagnosis not present

## 2021-01-07 DIAGNOSIS — I4892 Unspecified atrial flutter: Secondary | ICD-10-CM | POA: Diagnosis not present

## 2021-01-07 DIAGNOSIS — J8410 Pulmonary fibrosis, unspecified: Secondary | ICD-10-CM | POA: Diagnosis not present

## 2021-01-07 DIAGNOSIS — R1314 Dysphagia, pharyngoesophageal phase: Secondary | ICD-10-CM | POA: Diagnosis not present

## 2021-01-07 DIAGNOSIS — I509 Heart failure, unspecified: Secondary | ICD-10-CM | POA: Diagnosis not present

## 2021-01-07 DIAGNOSIS — I110 Hypertensive heart disease with heart failure: Secondary | ICD-10-CM | POA: Diagnosis not present

## 2021-01-07 DIAGNOSIS — R531 Weakness: Secondary | ICD-10-CM | POA: Diagnosis not present

## 2021-01-07 DIAGNOSIS — I5030 Unspecified diastolic (congestive) heart failure: Secondary | ICD-10-CM | POA: Diagnosis not present

## 2021-01-07 DIAGNOSIS — I5033 Acute on chronic diastolic (congestive) heart failure: Secondary | ICD-10-CM | POA: Diagnosis not present

## 2021-01-07 DIAGNOSIS — Z79899 Other long term (current) drug therapy: Secondary | ICD-10-CM | POA: Diagnosis not present

## 2021-01-07 DIAGNOSIS — M6281 Muscle weakness (generalized): Secondary | ICD-10-CM | POA: Diagnosis not present

## 2021-01-07 DIAGNOSIS — I517 Cardiomegaly: Secondary | ICD-10-CM | POA: Diagnosis not present

## 2021-01-07 DIAGNOSIS — J9611 Chronic respiratory failure with hypoxia: Secondary | ICD-10-CM | POA: Diagnosis not present

## 2021-01-07 DIAGNOSIS — I959 Hypotension, unspecified: Secondary | ICD-10-CM | POA: Diagnosis not present

## 2021-01-07 DIAGNOSIS — Z87440 Personal history of urinary (tract) infections: Secondary | ICD-10-CM | POA: Diagnosis not present

## 2021-01-07 DIAGNOSIS — R2689 Other abnormalities of gait and mobility: Secondary | ICD-10-CM | POA: Diagnosis not present

## 2021-01-07 DIAGNOSIS — J9601 Acute respiratory failure with hypoxia: Secondary | ICD-10-CM | POA: Diagnosis not present

## 2021-01-07 DIAGNOSIS — I5032 Chronic diastolic (congestive) heart failure: Secondary | ICD-10-CM | POA: Diagnosis not present

## 2021-01-07 DIAGNOSIS — R6520 Severe sepsis without septic shock: Secondary | ICD-10-CM | POA: Diagnosis not present

## 2021-01-07 DIAGNOSIS — I5021 Acute systolic (congestive) heart failure: Secondary | ICD-10-CM | POA: Diagnosis not present

## 2021-01-07 LAB — CBC
HCT: 28.2 % — ABNORMAL LOW (ref 36.0–46.0)
Hemoglobin: 8.5 g/dL — ABNORMAL LOW (ref 12.0–15.0)
MCH: 25.8 pg — ABNORMAL LOW (ref 26.0–34.0)
MCHC: 30.1 g/dL (ref 30.0–36.0)
MCV: 85.5 fL (ref 80.0–100.0)
Platelets: 533 10*3/uL — ABNORMAL HIGH (ref 150–400)
RBC: 3.3 MIL/uL — ABNORMAL LOW (ref 3.87–5.11)
RDW: 16.3 % — ABNORMAL HIGH (ref 11.5–15.5)
WBC: 12.2 10*3/uL — ABNORMAL HIGH (ref 4.0–10.5)
nRBC: 0 % (ref 0.0–0.2)

## 2021-01-07 LAB — BASIC METABOLIC PANEL
Anion gap: 9 (ref 5–15)
BUN: 26 mg/dL — ABNORMAL HIGH (ref 8–23)
CO2: 28 mmol/L (ref 22–32)
Calcium: 9.1 mg/dL (ref 8.9–10.3)
Chloride: 98 mmol/L (ref 98–111)
Creatinine, Ser: 0.68 mg/dL (ref 0.44–1.00)
GFR, Estimated: >60 mL/min (ref >60)
Glucose, Bld: 109 mg/dL — ABNORMAL HIGH (ref 70–99)
Potassium: 3.9 mmol/L (ref 3.5–5.1)
Sodium: 135 mmol/L (ref 135–145)

## 2021-01-07 MED ORDER — PREDNISONE 20 MG PO TABS: ORAL_TABLET | ORAL | Status: DC

## 2021-01-07 MED ORDER — FERROUS SULFATE 325 (65 FE) MG PO TBEC: 325.0000 mg | DELAYED_RELEASE_TABLET | Freq: Two times a day (BID) | ORAL | 3 refills | Status: DC

## 2021-01-07 MED ORDER — FUROSEMIDE 20 MG PO TABS: 40.0000 mg | ORAL_TABLET | Freq: Every day | ORAL | Status: DC

## 2021-01-07 MED ORDER — AMIODARONE HCL 200 MG PO TABS: 200.0000 mg | ORAL_TABLET | Freq: Two times a day (BID) | ORAL | Status: DC

## 2021-01-07 MED ORDER — DOCUSATE SODIUM 100 MG PO CAPS: 100.0000 mg | ORAL_CAPSULE | Freq: Two times a day (BID) | ORAL | 0 refills | Status: DC

## 2021-01-07 NOTE — TOC Transition Note (Signed)
Transition of Care [facility]) - CM/SW Discharge Note   Patient Details  Name: Hiromi Knodel MRN: [id number] Date of Birth: November 11, 1940  Transition of [facility] CM/SW Contact:  Ihor Gully, LCSW Phone Number: 01/07/2021, 2:40 PM   Clinical Narrative:    Patient authorized to return to [facility] at discharge. Discharge clinicals sent to facility. Daughter notified of d/c. Nurse to call report. EMS to provide transport.    Final next level of care: Skilled Nursing Facility Barriers to Discharge: No Barriers Identified   Patient Goals and CMS Choice        Discharge Placement              Patient chooses bed at: Other - please specify in the comment section below: ([facility]) Patient to be transferred to facility by: [facility] Name of family member notified: Stevenson Clinch, daughter Patient and family notified of of transfer: 01/07/21  Discharge Plan and Services                                     Social Determinants of Health (SDOH) Interventions     Readmission Risk Interventions Readmission Risk Prevention Plan 11/29/2020 11/28/2020  Transportation Screening - Complete  PCP or Specialist Appt within 5-7 Days Complete -  Home Care Screening - Complete  Medication Review (RN CM) - Complete

## 2021-01-07 NOTE — NC FL2 (Signed)
[place] MEDICAID FL2 LEVEL OF CARE SCREENING TOOL     IDENTIFICATION  Patient Name: Monique Robles Birthdate: 1940/09/28 Sex: female Admission Date (Current Location): 12/31/2020  [facility] and [place] Number:  [employer] and Address:  [place] [address] [place]      Provider Number: [id number]  Attending Physician Name and Address:  Kathie Dike, MD  Relative Name and Phone Number:  Chapmon,[name] (Daughter)   [phone]    Current Level of Care: Hospital Recommended Level of Care: [place] Prior Approval Number:    Date Approved/Denied:   PASRR Number:    Discharge Plan: SNF    Current Diagnoses: Patient Active Problem List   Diagnosis Date Noted   Severe sepsis ([place]) 12/31/2020   Chronic respiratory failure with hypoxia ([place]) 12/06/2020   DOE (dyspnea on exertion) 12/05/2020   Esophageal dysphagia    Chronic lung disease    Drug-induced pneumonitis    Iron deficiency anemia due to chronic blood loss 11/23/2020   Acute respiratory failure with hypoxia ([place]) 11/21/2020   GERD (gastroesophageal reflux disease) 11/21/2020   Bronchitis 11/21/2020   Obesity (BMI 30-39.9) 11/21/2020   Insomnia 11/21/2020   Acute respiratory failure ([place]) 11/21/2020   Overactive bladder 08/24/2020   Acute cystitis without hematuria 08/24/2020   Chronic cystitis with hematuria 08/24/2020   Essential tremor 08/15/2019   Cerebrovascular accident (CVA) ([place]) 08/15/2019   Gait abnormality 08/15/2019    Orientation RESPIRATION BLADDER Height & Weight     Self, Time, Situation, Place  O2 (2L) Continent Weight: 188 lb 11.4 oz (85.6 kg) Height:  5\' 1"  (154.9 cm)  BEHAVIORAL SYMPTOMS/MOOD NEUROLOGICAL BOWEL NUTRITION STATUS      Continent Diet (low sodium heart healthy)  AMBULATORY STATUS COMMUNICATION OF NEEDS Skin   Limited Assist Verbally Normal                       Personal Care Assistance Level of  Assistance  Bathing, Dressing, Feeding Bathing Assistance: Limited assistance Feeding assistance: Independent Dressing Assistance: Limited assistance     Functional Limitations Info  Sight, Speech, Hearing Sight Info: Adequate Hearing Info: Adequate Speech Info: Adequate    SPECIAL CARE FACTORS FREQUENCY  PT (By licensed PT)     PT Frequency: 5x/week              Contractures Contractures Info: Not present    Additional Factors Info  Code Status, Allergies Code Status Info: DNR Allergies Info: Coumadin, Atorvastatihn, Ciprofloxacin, Macrobid, Sulfa Antibiotics           Current Medications (01/07/2021):  This is the current hospital active medication list Current Facility-Administered Medications  Medication Dose Route Frequency Provider Last Rate Last Admin   0.9 %  sodium chloride infusion   Intravenous PRN Kathie Dike, MD   Stopped at 01/04/21 1346   acetaminophen (TYLENOL) tablet 650 mg  650 mg Oral Q6H PRN Shawna Clamp, MD   650 mg at 01/07/21 1129   Or   acetaminophen (TYLENOL) suppository 650 mg  650 mg Rectal Q6H PRN Shawna Clamp, MD       acidophilus (RISAQUAD) capsule 1 capsule  1 capsule Oral Daily Einar Grad, RPH   1 capsule at 01/07/21 1042   amiodarone (PACERONE) tablet 200 mg  200 mg Oral BID Arnoldo Lenis, MD   200 mg at 01/07/21 1042   apixaban (ELIQUIS) tablet 5 mg  5 mg Oral BID Dwyane Dee,  Pardeep, MD   5 mg at 01/07/21 1041   Chlorhexidine Gluconate Cloth 2 % PADS 6 each  6 each Topical Daily Adefeso, Oladapo, DO   6 each at 01/07/21 1058   cholecalciferol (VITAMIN D3) tablet 1,000 Units  1,000 Units Oral Daily Shawna Clamp, MD   1,000 Units at 01/07/21 1042   cyclobenzaprine (FLEXERIL) tablet 5 mg  5 mg Oral TID PRN Kathie Dike, MD   5 mg at 01/06/21 2150   docusate sodium (COLACE) capsule 100 mg  100 mg Oral BID Shawna Clamp, MD   100 mg at 01/07/21 1041   furosemide (LASIX) tablet 40 mg  40 mg Oral Daily Arnoldo Lenis, MD   40 mg at 01/07/21 1042   gabapentin (NEURONTIN) capsule 200 mg  200 mg Oral TID Shawna Clamp, MD   200 mg at 01/07/21 1041   HYDROcodone-acetaminophen (NORCO/VICODIN) 5-325 MG per tablet 1 tablet  1 tablet Oral Q6H PRN Kathie Dike, MD   1 tablet at 01/05/21 1632   levalbuterol (XOPENEX) nebulizer solution 1.25 mg  1.25 mg Nebulization Q6H PRN Shawna Clamp, MD       melatonin tablet 9 mg  9 mg Oral QHS Kathie Dike, MD   9 mg at 01/06/21 2120   midodrine (PROAMATINE) tablet 5 mg  5 mg Oral TID WC Shawna Clamp, MD   5 mg at 01/07/21 1340   Muscle Rub CREA   Topical PRN Heath Lark D, DO   1 application at 12/28/09 1448   ondansetron (ZOFRAN) tablet 4 mg  4 mg Oral Q6H PRN Shawna Clamp, MD       Or   ondansetron [facility]) injection 4 mg  4 mg Intravenous Q6H PRN Shawna Clamp, MD   4 mg at 01/04/21 0901   pantoprazole (PROTONIX) EC tablet 40 mg  40 mg Oral Daily Shawna Clamp, MD   40 mg at 01/07/21 1041   predniSONE (DELTASONE) tablet 40 mg  40 mg Oral Q breakfast Chesley Mires, MD   40 mg at 01/07/21 0825   senna-docusate (Senokot-S) tablet 2 tablet  2 tablet Oral QHS PRN Shawna Clamp, MD       sucralfate (CARAFATE) tablet 1 g  1 g Oral QHS Shawna Clamp, MD   1 g at 01/06/21 2120     Discharge Medications: Please see discharge summary for a list of discharge medications.  Relevant Imaging Results:  Relevant Lab Results:   Additional Information PT SSN [id number]  Ihor Gully, LCSW

## 2021-01-07 NOTE — Progress Notes (Signed)
Physical Therapy Treatment Patient Details Name: Monique Robles MRN: [id number] DOB: 10-22-1940 Today's Date: 01/07/2021   History of Present Illness Aneta Hendershott is a 80 y.o. female with PMH significant of paroxysmal A. fib on Eliquis, diastolic CHF with the last LVEF 65 to 70%, GERD, history of pulmonary embolism, history of stroke, hypertension, hyperlipidemia, vitamin D deficiency, essential tremor presented to the ED with worsening shortness of breath.  Patient was seen in the ED last week and found to have A. fib with RVR.  Patient underwent successful cardioversion into normal sinus rhythm and patient was discharged to rehab.  Patient has been admitted with recurrent atrial fibrillation with RVR with associated acute on chronic hypoxemic respiratory failure in the setting of CHF. She also has a UTI.    PT Comments    Patient agreeable for therapy and her daughter present in room.  Patient demonstrates increased strength for sitting up at bedside requiring less assistance with HOB flat, fair/good return for completing BLE ROM/strengthening exercises with verbal cueing and occasional demonstration.  Patient tolerated taking steps at bedside forward/backwards, side steps while on 2 LPM O2 with SpO2 dropping from 95% to 85%, HR at 78-81 BPM and limited mostly due to fatigue, SOB and generalized weakness.  Patient tolerated sitting up in chair after therapy with SpO2 increasing above 90% after resting and HR at 71 BPM.  Patient will benefit from continued physical therapy in hospital and recommended venue below to increase strength, balance, endurance for safe ADLs and gait.      Recommendations for follow up therapy are one component of a multi-disciplinary discharge planning process, led by the attending physician.  Recommendations may be updated based on patient status, additional functional criteria and insurance authorization.  Follow Up Recommendations  Skilled nursing-short term rehab  (<3 hours/day)     Assistance Recommended at Discharge Intermittent Supervision/Assistance  Equipment Recommendations  [facility]    Recommendations for Other Services       Precautions / Restrictions Precautions Precautions: Fall Restrictions Weight Bearing Restrictions: No     Mobility  Bed Mobility Overal bed mobility: Needs Assistance Bed Mobility: Supine to Sit     Supine to sit: Min guard     General bed mobility comments: increased time, labored movement    Transfers Overall transfer level: Needs assistance Equipment used: Rolling walker (2 wheels) Transfers: Sit to/from [facility] Sit to Stand: Min assist Stand pivot transfers: Min assist         General transfer comment: slow labored unsteady movement    Ambulation/Gait Ambulation/Gait assistance: Mod assist Gait Distance (Feet): 10 Feet Assistive device: Rolling walker (2 wheels) Gait Pattern/deviations: Decreased step length - right;Decreased step length - left;Decreased stride length Gait velocity: decreased   General Gait Details: limited to a few slow labored side steps and steps forward/backwards before having to sit due to fatigue while on 2 LPM O2 with SpO2 dropping from 95% to 85%   Stairs             Wheelchair Mobility    Modified Rankin (Stroke Patients Only)       Balance Overall balance assessment: Needs assistance Sitting-balance support: Feet supported;No upper extremity supported Sitting balance-Leahy Scale: Good Sitting balance - Comments: seated at EOB   Standing balance support: Reliant on assistive device for balance;During functional activity;Bilateral upper extremity supported Standing balance-Leahy Scale: Poor Standing balance comment: fair/poor using RW  Cognition Arousal/Alertness: Awake/alert Behavior During Therapy: WFL for tasks assessed/performed Overall Cognitive Status: Within Functional Limits for  tasks assessed                                          Exercises General Exercises - Lower Extremity Long Arc Quad: Seated;AROM;Strengthening;Both;10 reps Hip Flexion/Marching: Seated;AROM;Strengthening;Both;10 reps Toe Raises: Seated;AROM;Strengthening;Both;20 reps Heel Raises: Seated;AROM;Strengthening;Both;20 reps    General Comments        Pertinent Vitals/Pain Pain Assessment: Faces Faces Pain Scale: Hurts even more Pain Descriptors / Indicators: Headache Pain Intervention(s): Limited activity within patient's tolerance;Monitored during session;RN gave pain meds during session    Home Living                          Prior Function            PT Goals (current goals can now be found in the care plan section) Acute Rehab PT Goals Patient Stated Goal: to go home after rehab PT Goal Formulation: With patient/family Time For Goal Achievement: 01/19/21 Potential to Achieve Goals: Good Progress towards PT goals: Progressing toward goals    Frequency    Min 3X/week      PT Plan Current plan remains appropriate    Co-evaluation              AM-PAC PT "6 Clicks" Mobility   Outcome Measure  Help needed turning from your back to your side while in a flat bed without using bedrails?: A Little Help needed moving from lying on your back to sitting on the side of a flat bed without using bedrails?: A Little Help needed moving to and from a bed to a chair (including a wheelchair)?: A Lot Help needed standing up from a chair using your arms (e.g., wheelchair or bedside chair)?: A Lot Help needed to walk in hospital room?: A Lot Help needed climbing 3-5 steps with a railing? : A Lot 6 Click Score: 14    End of Session Equipment Utilized During Treatment: Oxygen Activity Tolerance: Patient tolerated treatment well;Patient limited by fatigue Patient left: in chair;with call bell/phone within reach Nurse Communication: Mobility  status PT Visit Diagnosis: Unsteadiness on feet (R26.81);Muscle weakness (generalized) (M62.81);Difficulty in walking, not elsewhere classified (R26.2);History of falling (Z91.81)     Time: 1110-1135 PT Time Calculation (min) (ACUTE ONLY): 25 min  Charges:  $Therapeutic Exercise: 8-22 mins $Therapeutic Activity: 8-22 mins                     11:54 AM, 01/07/21 Lonell Grandchild, MPT Physical Therapist with [facility] [phone] office [phone] mobile phone

## 2021-01-07 NOTE — Progress Notes (Signed)
[facility]   Patient name: Monique Robles Admit date: 12/31/2020  DOB: 10-Mar-1941 LOS: 7  MRN: [id number] Consult date: 01/03/2021  Referring provider: Dr. Roderic Palau CC: Short of breath    History:  80 yo female with history of recurrent UTI's was on nitrofurantoin therapy from January through September 2022.  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  At the time she had changes on chest imaging consistent acute pneumonitis and more chronic fibrosis.  She was discharged home on supplemental oxygen and prednisone.  She had outpt follow up with Dr. Melvyn Novas and had prednisone weaned off as of mid oct 2022   She presented to the ER on 12/31/20 with dyspnea.  Found to have worsening hypoxia with SpO2 74% on 5 liters and A fib with RVR (HR 150 to 180).  Found to have recurrent UTI.  Started on amiodarone and antibiotics.  She was treated with diuretics.  Heart rate improved and remained in negative fluid balance.  Still had dyspnea and PCCM asked to assess.  Past medical history:  A fib, Diastolic CHF, GERD, PE, Pneumonia, CVA, HTN, MVR, Osteoporosis, Tremor, Vit D deficiency, Overactive bladder  Significant events:  10/24 Admit 10/28 ESR 60 >> restart prednisone; cardioversion >> back in sinus rhythm  Studies:  CT chest 02/23/17 >> post operative changes Rt lung, 5 mm nodule RUL Echo 09/14/20 >> EF 65 to 70%, grade 1 DD, mild LA dilation, mild MR, mild AS CT angio chest 11/22/20 >> prominent LN, diffuse patchy b/l GGO air space disease, mild esophageal dilation CT angio chest 12/31/20 >> b/l patchy GGO, prominent LN, small b/l effusions  Micro:  COVID/Flu 10/24 >> negative MRSA PCR 10/24 >> negative Blood 10/24 >> neg  Urine 10/25 >> negative  Lines:     Antibiotics:  Cefepime 10/24 Vancomycin 10/24 Rocephin 10/25 >> 10/28   Consults:  Cardiology     Scheduled Meds:  acidophilus  1 capsule Oral Daily   amiodarone  200 mg Oral BID   apixaban  5 mg Oral BID   Chlorhexidine Gluconate Cloth  6 each Topical Daily   cholecalciferol  1,000 Units Oral Daily   docusate sodium  100 mg Oral BID   furosemide  40 mg Oral Daily   gabapentin  200 mg Oral TID   melatonin  9 mg Oral QHS   midodrine  5 mg Oral TID WC   pantoprazole  40 mg Oral Daily   predniSONE  40 mg Oral Q breakfast   sucralfate  1 g Oral QHS   Continuous Infusions:  sodium chloride Stopped (01/04/21 1346)   PRN Meds:.sodium chloride, acetaminophen **OR** acetaminophen, cyclobenzaprine, HYDROcodone-acetaminophen, levalbuterol, Muscle Rub, ondansetron **OR** ondansetron (ZOFRAN) IV, senna-docusate   Interim history:  Has not yet walked much at all more due to weakness than sob   Vital signs:  BP 118/70 (BP Location: Left Arm)   Pulse (!) 52   Temp 98.2 F (36.8 C)   Resp 19   Ht '5\' 1"'  (1.549 m)   Wt 85.6 kg   SpO2 99%   BMI 35.66 kg/m   Intake/output:  I/O last 3 completed shifts: In: 400 [P.O.:400] Out: 2650 [Urine:2650]   Physical exam:   General appearance:     Tmax 98.4   At Rest 02 sats  99% on 2lpm  No jvd Oropharynx clear,  mucosa nl Neck supple Lungs with  min insp crackles s cough on insp  RRR no s3 or or sign murmur Abd soft with nl  excursion  Extr warm with no edema or clubbing noted Neuro  Sensorium intact ,  no apparent motor deficits     Best practice:   DVT - Eliquis SUP - Protonix Nutrition - Carb modified Mobility - OOB   Assessment/plan:   Acute on chronic hypoxic respiratory failure. 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 - sed rate up on 01/04/21 - started prednisone 40 mg daily on 01/04/21; slowly taper off over next few  weeks - follow serial sats with activity and ESR with goal of keeping < 20 and adjusting prednisone dose accordingly   A fib with RVR. Acute on chronic diastolic CHF. - s/p cardioversion 01/04/21 >> appears to be maintaining SR  - amiodarone, eliquis per cardiology - not effects of macardantin and amiodarone similar and acute phases of both may elevate ESR    Recurrent UTI. - completed Abx 01/04/21 with neg cultures   Deconditioning. - likely contributing to dyspnea - can probably start PT soon if she remains in sinus rhythm       Goals of care/Family discussions:  Code status: DNR  Updated pt's daughter at bedside on   01/07/2021   Labs:   CMP Latest Ref Rng & Units 01/07/2021 01/06/2021 01/05/2021  Glucose 70 - 99 mg/dL 109(H) 141(H) 130(H)  BUN 8 - 23 mg/dL 26(H) 23 20  Creatinine 0.44 - 1.00 mg/dL 0.68 0.75 0.69  Sodium 135 - 145 mmol/L 135 133(L) 133(L)  Potassium 3.5 - 5.1 mmol/L 3.9 4.0 4.2  Chloride 98 - 111 mmol/L 98 97(L) 101  CO2 22 - 32 mmol/L '28 26 27  ' Calcium 8.9 - 10.3 mg/dL 9.1 9.0 8.9  Total Protein 6.5 - 8.1 g/dL - - -  Total Bilirubin 0.3 - 1.2 mg/dL - - -  Alkaline Phos 38 - 126 U/L - - -  AST 15 - 41 U/L - - -  ALT 0 - 44 U/L - - -    CBC Latest Ref Rng & Units 01/07/2021 01/02/2021 01/01/2021  WBC 4.0 - 10.5 K/uL 12.2(H) 10.2 11.1(H)  Hemoglobin 12.0 - 15.0 g/dL 8.5(L) 9.5(L) 10.1(L)  Hematocrit 36.0 - 46.0 % 28.2(L) 33.1(L) 34.7(L)  Platelets 150 - 400 K/uL 533(H) 521(H) 549(H)    ABG    Component Value Date/Time   PHART 7.452 (H) 11/24/2020 0905   PCO2ART 36.3 11/24/2020 0905   PO2ART 57.6 (L) 11/24/2020 0905   HCO3 24.0 12/31/2020 1325   ACIDBASEDEF 3.2 (H) 11/23/2020 0945   O2SAT 45.1 12/31/2020 1325    CBG (last 3)  No results for input(s): GLUCAP in the last 72 hours.      Christinia Gully, MD Pulmonary and [place] [phone]   After 7:00 pm call Elink  [phone]

## 2021-01-07 NOTE — Discharge Summary (Signed)
Physician Discharge Summary  Monique Robles HYQ:[id number] DOB: [birth year] DOA: 12/31/2020  PCP: Celene Squibb, MD  Admit date: 12/31/2020 Discharge date: 01/07/2021  Admitted From: Skilled nursing facility Disposition: Skilled nursing facility  Recommendations for Outpatient Follow-up:  Follow up with PCP after being released from skilled nursing facility Please obtain BMP/CBC in one week to follow renal function and hemoglobin Continue oxygen at 2 L at rest, increased to 4 L on exertion.  Keep oxygen saturations greater than 90% Cardiology to schedule follow-up in the next 2 to 4 weeks Pulmonology to schedule follow-up in the next coming weeks Referral has been made for follow-up to neurology  Discharge Condition: Stable CODE STATUS: DNR Diet recommendation: Heart healthy  Brief/Interim Summary: Monique Robles is a 80 y.o. female with PMH significant of paroxysmal A. fib on Eliquis, diastolic CHF with the last LVEF 65 to 70%, GERD, history of pulmonary embolism, history of stroke, hypertension, hyperlipidemia, vitamin D deficiency, essential tremor presented to the ED with worsening shortness of breath.  Patient was seen in the ED last week and found to have A. fib with RVR.  Patient underwent successful cardioversion into normal sinus rhythm and patient was discharged to rehab.  Patient has been readmitted with recurrent atrial fibrillation with RVR with associated acute on chronic hypoxemic respiratory failure in the setting of CHF. She also has a UTI.  Discharge Diagnoses:  Principal Problem:   Severe sepsis ([place]) Active Problems:   Essential tremor   Overactive bladder   GERD (gastroesophageal reflux disease)   Insomnia   Iron deficiency anemia due to chronic blood loss   Chronic lung disease   Chronic respiratory failure with hypoxia (HCC)  Recurrent A. fib with RVR: Patient presented with increasing shortness of breath associated with palpitations. She was found to  have A. fib with RVR, heart rate 140- 146. She was initially restarted on home propranolol, and was also started on amiodarone infusion as well as Cardizem infusion Continue Eliquis. Last echocardiogram EF 65 to 70%.  Mild aortic stenosis. -Amiodarone/Cardizem held on 10/26 since she became hypotensive, possibly related to overdiuresis -Patient underwent DCCV on 10/28 -She converted to sinus rhythm and has remained in sinus since cardioversion -She is continued on oral amiodarone -She will follow-up with cardiology in the next 2 to 4 weeks   Acute on chronic hypoxic respiratory failure  Patient was recently discharged to rehab on 2 L of supplemental oxygen. She remained on 2 L until oxygen, but had to be titrated up to NRB on 10/25 during this admission Oxygen has since been weaned back down to 2 L.  Would probably benefit from increasing oxygen to 4 L on exertion Supplemental oxygen to keep saturation above 90%. -Etiology of respiratory failure felt to be multifactorial, related to CHF, atrial fibrillation, pulmonary fibrosis   Acute on chronic diastolic congestive heart failure -Cardiology following -She was being diuresed with IV Lasix -Overall net volume status has been negative -Creatinine had bumped up and IV diuretics were held -She was transitioned back to oral diuretics and baseline dose increased from Lasix 20 mg daily to Lasix 40 mg daily -Renal function has been stable   Pulmonary fibrosis related to nitrofurantoin -Patchy findings on chest imaging worrisome for underlying infection versus ILD -Appreciate pulmonology input -She completed a course of antibiotics. -ESR elevated and she is been started on a prolonged course of prednisone -She will follow-up with pulmonology in the next few weeks   UTI Discontinued cefepime and vancomycin,  switched to Rocephin Urine culture did not show any growth Procalcitonin low -Antibiotics now discontinued   Hypokalemia Replaced    Pleural effusion: CT chest shows bilateral pleural effusion It does not seem she needs thoracocentesis. -Overall effusions have improved with diuresis   Orthostatic hypotension: Continue midodrine.   Essential tremor: Was previously on propranolol, now held due to bradycardia -Referral has been made to her primary neurologist for follow-up to readdress treatment of essential tremor   AKI -Likely related to diuresis and hypotension -Overall creatinine has returned to baseline with holding diuretics. -Renal function has been stable with oral diuretics  Anemia -Suspect is related to chronic disease, no evidence of bleeding -She she was also noted to have some iron deficiency in 11/2020 -We will start on oral iron replacement therapy -Recheck CBC in 1 week to follow hemoglobin  Discharge Instructions  Discharge Instructions     Ambulatory referral to Neurology   Complete by: As directed    An appointment is requested in approximately: 4 weeks, essential tremor, now off propranolol due to bradycardia. Please readdress further treatment options   Diet - low sodium heart healthy   Complete by: As directed    Increase activity slowly   Complete by: As directed       Allergies as of 01/07/2021       Reactions   Coumadin [warfarin] Hives   Atorvastatin Other (See Comments)   Muscle aches   Ciprofloxacin    Macrobid [nitrofurantoin]    Cause respiratory flare   Sulfa Antibiotics Itching, Rash        Medication List     STOP taking these medications    propranolol 40 MG tablet Commonly known as: INDERAL       TAKE these medications    amiodarone 200 MG tablet Commonly known as: PACERONE Take 1 tablet (200 mg total) by mouth 2 (two) times daily.   apixaban 5 MG Tabs tablet Commonly known as: ELIQUIS Take 5 mg by mouth 2 (two) times daily.   calcium carbonate 500 MG chewable tablet Commonly known as: TUMS - dosed in mg elemental calcium Chew 1 tablet by  mouth daily as needed for indigestion or heartburn.   cholecalciferol 25 MCG (1000 UNIT) tablet Commonly known as: VITAMIN D3 Take 1,000 Units by mouth daily.   cyclobenzaprine 5 MG tablet Commonly known as: FLEXERIL Take 5 mg by mouth daily as needed.   diphenhydrAMINE 25 MG tablet Commonly known as: BENADRYL Take 25 mg by mouth daily as needed for allergies.   docusate sodium 100 MG capsule Commonly known as: COLACE Take 1 capsule (100 mg total) by mouth 2 (two) times daily.   ferrous sulfate 325 (65 FE) MG EC tablet Take 1 tablet (325 mg total) by mouth 2 (two) times daily.   furosemide 20 MG tablet Commonly known as: LASIX Take 2 tablets (40 mg total) by mouth daily. What changed: how much to take   gabapentin 100 MG capsule Commonly known as: NEURONTIN Take 200 mg by mouth 3 (three) times daily.   Gemtesa 75 MG Tabs Generic drug: Vibegron Take 1 capsule by mouth daily.   HYDROcodone-acetaminophen 5-325 MG tablet Commonly known as: NORCO/VICODIN Take 1 tablet by mouth 2 (two) times daily as needed for moderate pain.   Lactobacillus Tabs Take 1 tablet by mouth with breakfast, with lunch, and with evening meal. What changed: Another medication with the same name was removed. Continue taking this medication, and follow the directions you see here.  levalbuterol 1.25 MG/0.5ML nebulizer solution Commonly known as: XOPENEX Take 1.25 mg by nebulization every 6 (six) hours as needed for wheezing or shortness of breath.   Melatonin 10 MG Tabs Take 10 mg by mouth at bedtime.   midodrine 5 MG tablet Commonly known as: PROAMATINE Take 5 mg by mouth 3 (three) times daily with meals.   multivitamin tablet Take 1 tablet by mouth daily.   omeprazole 40 MG capsule Commonly known as: PRILOSEC Take 1 capsule by mouth daily.   predniSONE 20 MG tablet Commonly known as: DELTASONE Take 61m po daily and decrease by 126mevery 7 days until complete   senna-docusate  8.6-50 MG tablet Commonly known as: Senokot-S Take 2 tablets by mouth at bedtime as needed for moderate constipation.   sucralfate 1 g tablet Commonly known as: CARAFATE Take 1 g by mouth at bedtime.   TYLENOL ARTHRITIS PAIN PO Take 650 mg by mouth in the morning, at noon, and at bedtime.        Allergies  Allergen Reactions   Coumadin [Warfarin] Hives   Atorvastatin Other (See Comments)    Muscle aches   Ciprofloxacin    Macrobid [Nitrofurantoin]     Cause respiratory flare   Sulfa Antibiotics Itching and Rash    Consultations: Cardiology Pulmonology   Procedures/Studies: CT Angio Chest PE W and/or Wo Contrast  Result Date: 12/31/2020 CLINICAL DATA:  Pulmonary embolus suspected. High probability. Shortness of breath. Atrial fibrillation. Hypoxia. EXAM: CT ANGIOGRAPHY CHEST WITH CONTRAST TECHNIQUE: Multidetector CT imaging of the chest was performed using the standard protocol during bolus administration of intravenous contrast. Multiplanar CT image reconstructions and MIPs were obtained to evaluate the vascular anatomy. CONTRAST:  7574mMNIPAQUE IOHEXOL 350 MG/ML SOLN COMPARISON:  One-view chest x-ray 12/31/2020.  CTA chest 11/22/2020. FINDINGS: Cardiovascular: Heart is enlarged. There are calcifications are present. Pulmonary artery opacification is excellent. No focal filling defects are present to suggest pulmonary embolus. Pulmonary artery size is normal. Atherosclerotic calcifications are present in the aortic arch. Mediastinum/Nodes: Mild bilateral hilar adenopathy is again seen from right greater than left. No significant mediastinal or axillary adenopathy is present. Thyroid gland, trachea, and esophagus demonstrate no significant findings. Lungs/Pleura: Bilateral pleural effusions are present. Ground-glass attenuation is again noted bilaterally. No airway obstruction is present. Areas of dependent atelectasis are present bilaterally. Calcification is again noted along the  minor fissure. 5 mm right middle lobe calcified nodule again seen. Upper Abdomen: Cholecystectomy noted. Atherosclerotic changes are present within the aorta and branch vessels. Upper abdomen is otherwise unremarkable. Musculoskeletal: Exaggerated thoracic kyphosis again noted. No focal osseous lesions are present. Review of the MIP images confirms the above findings. IMPRESSION: 1. No evidence for pulmonary embolus. 2. Cardiomegaly with bilateral pleural effusions and ground-glass attenuation compatible with congestive heart failure and probable bilateral pneumonia. 3. Aortic Atherosclerosis (ICD10-I70.0). Electronically Signed   By: ChrSan MorelleD.   On: 12/31/2020 16:29   DG Chest Port 1 View  Result Date: 01/03/2021 CLINICAL DATA:  Respiratory distress EXAM: PORTABLE CHEST 1 VIEW COMPARISON:  Chest x-ray 12/31/2020 FINDINGS: Cardiomegaly and mediastinum appear unchanged. Calcified plaques in the aortic arch. Persistent prominent interstitial and patchy irregular airspace opacities identified bilaterally, not significantly changed since previous study. No new consolidation appreciated. No significant pleural effusion. No pneumothorax. IMPRESSION: No significant change since previous study. Electronically Signed   By: DelOfilia NeasD.   On: 01/03/2021 13:20   DG Chest Port 1 View  Result Date: 12/31/2020 CLINICAL DATA:  Shortness of breath EXAM: PORTABLE CHEST 1 VIEW COMPARISON:  12/27/2020 FINDINGS: The heart size and mediastinal contours are within normal limits. Mild, diffuse bilateral interstitial pulmonary opacity. The visualized skeletal structures are unremarkable. IMPRESSION: Mild, diffuse bilateral interstitial pulmonary opacity, which may reflect edema or infection. No focal airspace opacity. Electronically Signed   By: Delanna Ahmadi M.D.   On: 12/31/2020 14:37   DG Chest Portable 1 View  Result Date: 12/27/2020 CLINICAL DATA:  Hypoxia. EXAM: PORTABLE CHEST 1 VIEW  COMPARISON:  December 05, 2020. FINDINGS: Stable cardiomediastinal silhouette. Stable bilateral lung opacities are noted most consistent with chronic interstitial lung disease. No definite acute abnormality is noted. Bony thorax is unremarkable. IMPRESSION: Stable chronic findings as described above. No definite acute abnormality is noted. Electronically Signed   By: Marijo Conception M.D.   On: 12/27/2020 18:40   ECHOCARDIOGRAM LIMITED  Result Date: 01/03/2021    ECHOCARDIOGRAM LIMITED REPORT   Patient Name:   Monique Robles Date of Exam: 01/03/2021 Medical Rec #:  [id number]        Height:       61.0 in Accession #:    [id number]       Weight:       192.7 lb Date of Birth:  [birth year]        BSA:          1.859 m Patient Age:    19 years         BP:           113/60 mmHg Patient Gender: F                HR:           105 bpm. Exam Location:  [facility] Procedure: Limited Echo Indications:    Dyspnea  History:        Patient has prior history of Echocardiogram examinations, most                 recent 09/13/2020. Stroke, Signs/Symptoms:Dyspnea; Risk                 Factors:Hypertension and Dyslipidemia. Sepsis.  Sonographer:    Wenda Low Referring Phys: [id number] [place]  1. Left ventricular ejection fraction, by estimation, is 55 to 60%. The left ventricle has normal function. Left ventricular diastolic parameters are indeterminate.  2. Right ventricular systolic function is normal. The right ventricular size is normal.  3. IVC is small suggesting low RA pressure and hypovolemia. FINDINGS  Left Ventricle: Left ventricular ejection fraction, by estimation, is 55 to 60%. The left ventricle has normal function. Left ventricular diastolic parameters are indeterminate. Right Ventricle: The right ventricular size is normal. No increase in right ventricular wall thickness. Right ventricular systolic function is normal. Venous: IVC is small suggesting low RA pressure and hypovolemia. LEFT  VENTRICLE PLAX 2D LVIDd:         4.90 cm LVIDs:         3.30 cm LV PW:         0.70 cm LV IVS:        1.10 cm LVOT diam:     1.80 cm LVOT Area:     2.54 cm  LEFT ATRIUM         Index LA diam:    5.10 cm 2.74 cm/m   AORTA Ao Root diam: 2.90 cm Ao Asc diam:  3.20 cm  SHUNTS Systemic Diam: 1.80 cm Carlyle Dolly MD Electronically  signed by Carlyle Dolly MD Signature Date/Time: 01/03/2021/4:29:18 PM    Final       Subjective: She denies any shortness of breath.  No palpitations.  Discharge Exam: Vitals:   01/06/21 2300 01/07/21 0000 01/07/21 0004 01/07/21 0358  BP: 112/60  128/65 118/70  Pulse: (!) 56  (!) 58 (!) 52  Resp: '20  19 19  ' Temp:   98.4 F (36.9 C) 98.2 F (36.8 C)  TempSrc:      SpO2: 100%  100% 99%  Weight:  85.6 kg    Height:        General: Pt is alert, awake, not in acute distress Cardiovascular: RRR, S1/S2 +, no rubs, no gallops Respiratory: CTA bilaterally, no wheezing, no rhonchi Abdominal: Soft, NT, ND, bowel sounds + Extremities: no edema, no cyanosis    The results of significant diagnostics from this hospitalization (including imaging, microbiology, ancillary and laboratory) are listed below for reference.     Microbiology: Recent Results (from the past 240 hour(s))  Resp Panel by RT-PCR (Flu A&B, Covid) Nasopharyngeal Swab     Status: None   Collection Time: 12/31/20  1:26 PM   Specimen: Nasopharyngeal Swab; Nasopharyngeal(NP) swabs in vial transport medium  Result Value Ref Range Status   SARS Coronavirus 2 by RT PCR NEGATIVE NEGATIVE Final    Comment: (NOTE) SARS-CoV-2 target nucleic acids are NOT DETECTED.  The SARS-CoV-2 RNA is generally detectable in upper respiratory specimens during the acute phase of infection. The lowest concentration of SARS-CoV-2 viral copies this assay can detect is 138 copies/mL. A negative result does not preclude SARS-Cov-2 infection and should not be used as the sole basis for treatment or other patient  management decisions. A negative result may occur with  improper specimen collection/handling, submission of specimen other than nasopharyngeal swab, presence of viral mutation(s) within the areas targeted by this assay, and inadequate number of viral copies(<138 copies/mL). A negative result must be combined with clinical observations, patient history, and epidemiological information. The expected result is Negative.  Fact Sheet for Patients:  EntrepreneurPulse.com.au  Fact Sheet for Healthcare Providers:  IncredibleEmployment.be  This test is no t yet approved or cleared by the [place] FDA and  has been authorized for detection and/or diagnosis of SARS-CoV-2 by FDA under an Emergency Use Authorization (EUA). This EUA will remain  in effect (meaning this test can be used) for the duration of the COVID-19 declaration under Section 564(b)(1) of the Act, 21 U.S.C.section 360bbb-3(b)(1), unless the authorization is terminated  or revoked sooner.       Influenza A by PCR NEGATIVE NEGATIVE Final   Influenza B by PCR NEGATIVE NEGATIVE Final    Comment: (NOTE) The Xpert Xpress SARS-CoV-2/FLU/RSV plus assay is intended as an aid in the diagnosis of influenza from Nasopharyngeal swab specimens and should not be used as a sole basis for treatment. Nasal washings and aspirates are unacceptable for Xpert Xpress SARS-CoV-2/FLU/RSV testing.  Fact Sheet for Patients: EntrepreneurPulse.com.au  Fact Sheet for Healthcare Providers: IncredibleEmployment.be  This test is not yet approved or cleared by the [place] FDA and has been authorized for detection and/or diagnosis of SARS-CoV-2 by FDA under an Emergency Use Authorization (EUA). This EUA will remain in effect (meaning this test can be used) for the duration of the COVID-19 declaration under Section 564(b)(1) of the Act, 21 U.S.C. section 360bbb-3(b)(1),  unless the authorization is terminated or revoked.  Performed at [facility], [address] [place], [place] [address]  Blood culture (routine x 2)     Status: None   Collection Time: 12/31/20  1:43 PM   Specimen: Right Antecubital; Blood  Result Value Ref Range Status   Specimen Description RIGHT ANTECUBITAL  Final   Special Requests   Final    BOTTLES DRAWN AEROBIC AND ANAEROBIC Blood Culture results may not be optimal due to an inadequate volume of blood received in culture bottles   Culture   Final    NO GROWTH 5 DAYS Performed at [facility], [address], [place], [place] [address]    Report Status 01/05/2021 FINAL  Final  Blood culture (routine x 2)     Status: None   Collection Time: 12/31/20  1:46 PM   Specimen: Left Antecubital; Blood  Result Value Ref Range Status   Specimen Description LEFT ANTECUBITAL  Final   Special Requests   Final    BOTTLES DRAWN AEROBIC AND ANAEROBIC Blood Culture adequate volume   Culture   Final    NO GROWTH 5 DAYS Performed at [facility], [address] [place], [place] [address]    Report Status 01/05/2021 FINAL  Final  MRSA Next Gen by PCR, Nasal     Status: None   Collection Time: 12/31/20  4:29 PM   Specimen: Nasal Mucosa; Nasal Swab  Result Value Ref Range Status   MRSA by PCR Next Gen NOT DETECTED NOT DETECTED Final    Comment: (NOTE) The GeneXpert MRSA Assay (FDA approved for NASAL specimens only), is one component of a comprehensive MRSA colonization surveillance program. It is not intended to diagnose MRSA infection nor to guide or monitor treatment for MRSA infections. Test performance is not FDA approved in patients less than 79 years old. Performed at [facility], [address], [place], [place] [address]   Urine Culture     Status: None   Collection Time: 01/01/21 10:15 AM   Specimen: Urine, Clean Catch  Result Value Ref Range Status   Specimen Description   Final    URINE, CLEAN CATCH Performed at [facility], [address] [place], [place] [address]    Special Requests   Final    NONE Performed at [facility], [address] [place], [place] [address]    Culture   Final    NO GROWTH Performed at [facility], [place] [address], [place], [place] [address]    Report Status 01/02/2021 FINAL  Final     Labs: BNP (last 3 results) Recent Labs    12/31/20 1334 01/03/21 0427 01/04/21 0405  BNP 556.0* 153.0* 301.6*   Basic Metabolic Panel: Recent Labs  Lab 01/02/21 0451 01/03/21 0427 01/04/21 0405 01/04/21 1523 01/05/21 0443 01/06/21 0336 01/07/21 0346  NA 135 132* 131*  --  133* 133* 135  K 3.8 3.9 3.0*  --  4.2 4.0 3.9  CL 101 95* 97*  --  101 97* 98  CO2 '23 27 25  ' --  '27 26 28  ' GLUCOSE 105* 116* 110*  --  130* 141* 109*  BUN 21 27* 22  --  20 23 26*  CREATININE 0.96 1.38* 0.89  --  0.69 0.75 0.68  CALCIUM 8.7* 8.8* 8.5*  --  8.9 9.0 9.1  MG 1.9 2.2  --  2.0  --   --   --    Liver Function Tests: Recent Labs  Lab 01/01/21 0503  AST 22  ALT 10  ALKPHOS 74  BILITOT 0.6  PROT 6.1*  ALBUMIN 2.7*   No results for input(s): LIPASE, AMYLASE in the last 168 hours. No results for input(s): AMMONIA in the last 168 hours. CBC: Recent Labs  Lab 12/31/20 1506 01/01/21 0503 01/02/21 0451 01/07/21 0346  WBC 12.3* 11.1* 10.2 12.2*  NEUTROABS 7.1  --   --   --   HGB 10.9* 10.1* 9.5* 8.5*  HCT 36.9 34.7* 33.1* 28.2*  MCV 89.1 90.4 89.0 85.5  PLT 602* 549* 521* 533*   Cardiac Enzymes: No results for input(s): CKTOTAL, CKMB, CKMBINDEX, TROPONINI in the last 168 hours. BNP: Invalid input(s): POCBNP CBG: No results for input(s): GLUCAP in the last 168 hours. D-Dimer No results for input(s): DDIMER in the last 72 hours. Hgb A1c No results for input(s): HGBA1C in the last 72 hours. Lipid Profile No results for input(s): CHOL, HDL, LDLCALC, TRIG, CHOLHDL, LDLDIRECT in the last 72 hours. Thyroid function studies No results for input(s): TSH, T4TOTAL, T3FREE,  THYROIDAB in the last 72 hours.  Invalid input(s): FREET3 Anemia work up No results for input(s): VITAMINB12, FOLATE, FERRITIN, TIBC, IRON, RETICCTPCT in the last 72 hours. Urinalysis    Component Value Date/Time   COLORURINE AMBER (A) 12/31/2020 1628   APPEARANCEUR TURBID (A) 12/31/2020 1628   APPEARANCEUR Clear 09/28/2020 1121   LABSPEC 1.018 12/31/2020 1628   PHURINE 5.0 12/31/2020 1628   GLUCOSEU NEGATIVE 12/31/2020 1628   HGBUR SMALL (A) 12/31/2020 1628   BILIRUBINUR NEGATIVE 12/31/2020 1628   BILIRUBINUR Negative 09/28/2020 1121   [place] 12/31/2020 1628   PROTEINUR 30 (A) 12/31/2020 1628   NITRITE POSITIVE (A) 12/31/2020 1628   LEUKOCYTESUR LARGE (A) 12/31/2020 1628   Sepsis Labs Invalid input(s): PROCALCITONIN,  WBC,  LACTICIDVEN Microbiology Recent Results (from the past 240 hour(s))  Resp Panel by RT-PCR (Flu A&B, Covid) Nasopharyngeal Swab     Status: None   Collection Time: 12/31/20  1:26 PM   Specimen: Nasopharyngeal Swab; Nasopharyngeal(NP) swabs in vial transport medium  Result Value Ref Range Status   SARS Coronavirus 2 by RT PCR NEGATIVE NEGATIVE Final    Comment: (NOTE) SARS-CoV-2 target nucleic acids are NOT DETECTED.  The SARS-CoV-2 RNA is generally detectable in upper respiratory specimens during the acute phase of infection. The lowest concentration of SARS-CoV-2 viral copies this assay can detect is 138 copies/mL. A negative result does not preclude SARS-Cov-2 infection and should not be used as the sole basis for treatment or other patient management decisions. A negative result may occur with  improper specimen collection/handling, submission of specimen other than nasopharyngeal swab, presence of viral mutation(s) within the areas targeted by this assay, and inadequate number of viral copies(<138 copies/mL). A negative result must be combined with clinical observations, patient history, and epidemiological information. The expected  result is Negative.  Fact Sheet for Patients:  EntrepreneurPulse.com.au  Fact Sheet for Healthcare Providers:  IncredibleEmployment.be  This test is no t yet approved or cleared by the [place] FDA and  has been authorized for detection and/or diagnosis of SARS-CoV-2 by FDA under an Emergency Use Authorization (EUA). This EUA will remain  in effect (meaning this test can be used) for the duration of the COVID-19 declaration under Section 564(b)(1) of the Act, 21 U.S.C.section 360bbb-3(b)(1), unless the authorization is terminated  or revoked sooner.       Influenza A by PCR NEGATIVE NEGATIVE Final   Influenza B by PCR NEGATIVE NEGATIVE Final    Comment: (NOTE) The Xpert Xpress SARS-CoV-2/FLU/RSV plus assay is intended as an aid  in the diagnosis of influenza from Nasopharyngeal swab specimens and should not be used as a sole basis for treatment. Nasal washings and aspirates are unacceptable for Xpert Xpress SARS-CoV-2/FLU/RSV testing.  Fact Sheet for Patients: EntrepreneurPulse.com.au  Fact Sheet for Healthcare Providers: IncredibleEmployment.be  This test is not yet approved or cleared by the [place] FDA and has been authorized for detection and/or diagnosis of SARS-CoV-2 by FDA under an Emergency Use Authorization (EUA). This EUA will remain in effect (meaning this test can be used) for the duration of the COVID-19 declaration under Section 564(b)(1) of the Act, 21 U.S.C. section 360bbb-3(b)(1), unless the authorization is terminated or revoked.  Performed at [facility], [address], [place], [place] [address]   Blood culture (routine x 2)     Status: None   Collection Time: 12/31/20  1:43 PM   Specimen: Right Antecubital; Blood  Result Value Ref Range Status   Specimen Description RIGHT ANTECUBITAL  Final   Special Requests   Final    BOTTLES DRAWN AEROBIC AND ANAEROBIC Blood  Culture results may not be optimal due to an inadequate volume of blood received in culture bottles   Culture   Final    NO GROWTH 5 DAYS Performed at [facility], [address] [place], [place] [address]    Report Status 01/05/2021 FINAL  Final  Blood culture (routine x 2)     Status: None   Collection Time: 12/31/20  1:46 PM   Specimen: Left Antecubital; Blood  Result Value Ref Range Status   Specimen Description LEFT ANTECUBITAL  Final   Special Requests   Final    BOTTLES DRAWN AEROBIC AND ANAEROBIC Blood Culture adequate volume   Culture   Final    NO GROWTH 5 DAYS Performed at [facility], [address] [place], [place] [address]    Report Status 01/05/2021 FINAL  Final  MRSA Next Gen by PCR, Nasal     Status: None   Collection Time: 12/31/20  4:29 PM   Specimen: Nasal Mucosa; Nasal Swab  Result Value Ref Range Status   MRSA by PCR Next Gen NOT DETECTED NOT DETECTED Final    Comment: (NOTE) The GeneXpert MRSA Assay (FDA approved for NASAL specimens only), is one component of a comprehensive MRSA colonization surveillance program. It is not intended to diagnose MRSA infection nor to guide or monitor treatment for MRSA infections. Test performance is not FDA approved in patients less than 66 years old. Performed at [facility], [address], [place], [place] [address]   Urine Culture     Status: None   Collection Time: 01/01/21 10:15 AM   Specimen: Urine, Clean Catch  Result Value Ref Range Status   Specimen Description   Final    URINE, CLEAN CATCH Performed at [facility], [address], [place], [place] [address]    Special Requests   Final    NONE Performed at [facility], [address], [place], [place] [address]    Culture   Final    NO GROWTH Performed at [facility], [place] [address] [place],  [address]    Report Status 01/02/2021 FINAL  Final     Time coordinating discharge: 50mns  SIGNED:   JKathie Dike MD  [facility] 01/07/2021, 1:44 PM   If 7PM-7AM, please contact night-coverage [email or web address]

## 2021-01-07 NOTE — Care Management Important Message (Signed)
Important Message  Patient Details  Name: Monique Robles MRN: [id number] Date of Birth: 08/15/1940   Medicare Important Message Given:  Yes     Tommy Medal 01/07/2021, 1:59 PM

## 2021-01-07 NOTE — Progress Notes (Addendum)
Progress Note  Patient Name: Monique Robles Date of Encounter: 01/07/2021  [facility] Cardiologist: Dorris Carnes, MD    Subjective   No complaints  Inpatient Medications    Scheduled Meds:  acidophilus  1 capsule Oral Daily   amiodarone  200 mg Oral BID   apixaban  5 mg Oral BID   Chlorhexidine Gluconate Cloth  6 each Topical Daily   cholecalciferol  1,000 Units Oral Daily   docusate sodium  100 mg Oral BID   furosemide  40 mg Oral Daily   gabapentin  200 mg Oral TID   melatonin  9 mg Oral QHS   midodrine  5 mg Oral TID WC   pantoprazole  40 mg Oral Daily   predniSONE  40 mg Oral Q breakfast   sucralfate  1 g Oral QHS   Continuous Infusions:  sodium chloride Stopped (01/04/21 1346)   PRN Meds: sodium chloride, acetaminophen **OR** acetaminophen, cyclobenzaprine, HYDROcodone-acetaminophen, levalbuterol, Muscle Rub, ondansetron **OR** ondansetron (ZOFRAN) IV, senna-docusate   Vital Signs    Vitals:   01/06/21 2300 01/07/21 0000 01/07/21 0004 01/07/21 0358  BP: 112/60  128/65 118/70  Pulse: (!) 56  (!) 58 (!) 52  Resp: 20  19 19   Temp:   98.4 F (36.9 C) 98.2 F (36.8 C)  TempSrc:      SpO2: 100%  100% 99%  Weight:  85.6 kg    Height:        Intake/Output Summary (Last 24 hours) at 01/07/2021 1044 Last data filed at 01/07/2021 0900 Gross per 24 hour  Intake 640 ml  Output 2150 ml  Net -1510 ml   Last 3 Weights 01/07/2021 01/02/2021 12/31/2020  Weight (lbs) 188 lb 11.4 oz 192 lb 10.9 oz 184 lb 1.4 oz  Weight (kg) 85.6 kg 87.4 kg 83.5 kg      Telemetry    NSR - Personally Reviewed  ECG     No ECG for review today.  Physical Exam    GEN: No acute distress.   Neck: No JVD Cardiac: RRR, no gallops.  Respiratory: Clear to auscultation bilaterally. GI: Soft, nontender, non-distended  MS: No edema.  Labs    High Sensitivity Troponin:   Recent Labs  Lab 12/31/20 1333 12/31/20 1506  TROPONINIHS 9 9     Chemistry Recent Labs  Lab  12/31/20 1333 01/01/21 0503 01/02/21 0451 01/03/21 0427 01/04/21 0405 01/04/21 1523 01/05/21 0443 01/06/21 0336 01/07/21 0346  NA 137 137 135 132*   < >  --  133* 133* 135  K 3.7 3.4* 3.8 3.9   < >  --  4.2 4.0 3.9  CL 107 103 101 95*   < >  --  101 97* 98  CO2 24 24 23 27    < >  --  27 26 28   GLUCOSE 99 90 105* 116*   < >  --  130* 141* 109*  BUN 16 18 21  27*   < >  --  20 23 26*  CREATININE 0.67 0.74 0.96 1.38*   < >  --  0.69 0.75 0.68  CALCIUM 8.2* 8.4* 8.7* 8.8*   < >  --  8.9 9.0 9.1  MG 2.0  --  1.9 2.2  --  2.0  --   --   --   PROT 6.4* 6.1*  --   --   --   --   --   --   --   ALBUMIN 3.0* 2.7*  --   --   --   --   --   --   --  AST 28 22  --   --   --   --   --   --   --   ALT 12 10  --   --   --   --   --   --   --   ALKPHOS 79 74  --   --   --   --   --   --   --   BILITOT 0.8 0.6  --   --   --   --   --   --   --   GFRNONAA >60 >60 >60 39*   < >  --  >60 >60 >60  ANIONGAP 6 10 11 10    < >  --  5 10 9    < > = values in this interval not displayed.    Hematology Recent Labs  Lab 01/01/21 0503 01/02/21 0451 01/07/21 0346  WBC 11.1* 10.2 12.2*  RBC 3.84* 3.72* 3.30*  HGB 10.1* 9.5* 8.5*  HCT 34.7* 33.1* 28.2*  MCV 90.4 89.0 85.5  MCH 26.3 25.5* 25.8*  MCHC 29.1* 28.7* 30.1  RDW 16.8* 16.7* 16.3*  PLT 549* 521* 533*    BNP Recent Labs  Lab 12/31/20 1334 01/03/21 0427 01/04/21 0405  BNP 556.0* 153.0* 161.0*     Radiology    No results found.  Cardiac Studies  Limited echo 01/03/21 IMPRESSIONS   1. Left ventricular ejection fraction, by estimation, is 55 to 60%. The  left ventricle has normal function. Left ventricular diastolic parameters  are indeterminate.   2. Right ventricular systolic function is normal. The right ventricular  size is normal.   3. IVC is small suggesting low RA pressure and hypovolemia.    Echo 09/13/20 IMPRESSIONS    1. Left ventricular ejection fraction, by estimation, is 65 to 70%. The  left ventricle has normal  function. The left ventricle has no regional  wall motion abnormalities. Left ventricular diastolic parameters are  consistent with Grade I diastolic  dysfunction (impaired relaxation).   2. Right ventricular systolic function is normal. The right ventricular  size is normal. There is normal pulmonary artery systolic pressure.   3. Left atrial size was mildly dilated.   4. The mitral valve is normal in structure. Mild mitral valve  regurgitation.   5. AV is thickened, calcified with very mildly restricted motion. Peak  and mean gradients through the valve are 20 and 10 mm Hg respectively  consistent with mild AS.Marland Kitchen The aortic valve is tricuspid. Aortic valve  regurgitation is not visualized.   6. The inferior vena cava is normal in size with greater than 50%  respiratory variability, suggesting right atrial pressure of 3 mmHg.    NST 10/2020 No diagnostic ST segment changes indicate ischemia. No significant myocardial perfusion defect indicate scar or ischemia. This is a low risk study. Nuclear stress EF: 68%.   Assessment & Plan    PAF with RVR in the setting of UTI S/P DCCV 01/04/21 now on amiodarone-not optimal drug with pulmonary history but best option at this time. Was on propranolol for tremor (held with sinus bradycardia). On eliquis. Was on IV amiodarone 60 mg/hr and now on 200 mg bid-will arrange outpatient f/u with plans to decrease amiodarone to 200 mg once daily.  Acute on chronic diastolic CHF limited echo LVEF 55-60% exacerbated by Afib with RVR. 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   Medication  Recommendations:  Eliquis 5 mg bid/amiodarone 200 mg bid Other recommendations (labs, testing, etc):    Follow up as an outpatient:  will arrange f/u 2-4 weeks  For questions or updates, please contact [place] Please consult [email or web address] for contact info under       Signed, Ermalinda Barrios, PA-C  01/07/2021, 10:44 AM     Attending note:  Chart reviewed and case discussed with Ms. Bonnell Public PA-C as well as Dr. Roderic Palau.  Patient maintaining sinus rhythm since successful cardioversion on October 28.  Plan is to continue oral amiodarone 200 mg twice daily with outpatient follow-up arranged and planned reduction to 200 mg once daily.  Also on Eliquis for stroke prophylaxis.  Had been on propranolol for tremors, held in light of sinus bradycardia at this time.  Other treatment options for tremor to be considered by primary team. We will sign off.  Satira Sark, M.D., F.A.C.C.

## 2021-01-08 ENCOUNTER — Telehealth: Payer: Self-pay | Admitting: Internal Medicine

## 2021-01-08 DIAGNOSIS — I4819 Other persistent atrial fibrillation: Secondary | ICD-10-CM | POA: Diagnosis not present

## 2021-01-08 DIAGNOSIS — I5021 Acute systolic (congestive) heart failure: Secondary | ICD-10-CM | POA: Diagnosis not present

## 2021-01-08 DIAGNOSIS — A4189 Other specified sepsis: Secondary | ICD-10-CM | POA: Diagnosis not present

## 2021-01-08 DIAGNOSIS — K219 Gastro-esophageal reflux disease without esophagitis: Secondary | ICD-10-CM | POA: Diagnosis not present

## 2021-01-08 NOTE — Telephone Encounter (Signed)
Called and spoke to [place] (pts daughter - ok per dpr). Per Dr. Melvyn Novas verbally to me and Kenney Houseman states that it is better for pt to follow up in [place] office since she may need a CXR and/or labs. She could get them done in the office and also for availability reasons. Daughter voiced understanding and is okay with the plan to go to [facility] office and follow up in [place] after that. Nothing further needed at this time.

## 2021-01-08 NOTE — Telephone Encounter (Signed)
Pt's daughter aware first available with Dr. Melvyn Novas 12/15 and Dr. Halford Chessman, January.  Let her know that if appt needs to be done in [place], can follow-up in [place] for future appts.  Patient was discharged yesterday and in nursing facility at [facility] and would have to come by transportation.

## 2021-01-10 ENCOUNTER — Ambulatory Visit: Payer: Medicare Other | Admitting: Internal Medicine

## 2021-01-16 ENCOUNTER — Emergency Department (HOSPITAL_COMMUNITY)
Admission: EM | Admit: 2021-01-16 | Discharge: 2021-01-16 | Disposition: A | Discharge status: Home / Self Care | Payer: Medicare Other | Attending: Emergency Medicine | Admitting: Emergency Medicine

## 2021-01-16 ENCOUNTER — Ambulatory Visit (INDEPENDENT_AMBULATORY_CARE_PROVIDER_SITE_OTHER): Payer: Medicare Other | Admitting: Primary Care

## 2021-01-16 ENCOUNTER — Encounter: Payer: Self-pay | Admitting: Primary Care

## 2021-01-16 ENCOUNTER — Other Ambulatory Visit: Payer: Self-pay

## 2021-01-16 ENCOUNTER — Emergency Department (HOSPITAL_COMMUNITY): Payer: Medicare Other

## 2021-01-16 VITALS — BP 118/76 | HR 89 | Temp 97.9°F | Ht 61.0 in | Wt 178.8 lb

## 2021-01-16 DIAGNOSIS — I4892 Unspecified atrial flutter: Secondary | ICD-10-CM | POA: Diagnosis not present

## 2021-01-16 DIAGNOSIS — I517 Cardiomegaly: Secondary | ICD-10-CM | POA: Diagnosis not present

## 2021-01-16 DIAGNOSIS — I509 Heart failure, unspecified: Secondary | ICD-10-CM | POA: Insufficient documentation

## 2021-01-16 DIAGNOSIS — Z79899 Other long term (current) drug therapy: Secondary | ICD-10-CM | POA: Insufficient documentation

## 2021-01-16 DIAGNOSIS — Z7901 Long term (current) use of anticoagulants: Secondary | ICD-10-CM | POA: Diagnosis not present

## 2021-01-16 DIAGNOSIS — T50905A Adverse effect of unspecified drugs, medicaments and biological substances, initial encounter: Secondary | ICD-10-CM

## 2021-01-16 DIAGNOSIS — R609 Edema, unspecified: Secondary | ICD-10-CM | POA: Diagnosis not present

## 2021-01-16 DIAGNOSIS — R531 Weakness: Secondary | ICD-10-CM | POA: Diagnosis not present

## 2021-01-16 DIAGNOSIS — Z96652 Presence of left artificial knee joint: Secondary | ICD-10-CM | POA: Insufficient documentation

## 2021-01-16 DIAGNOSIS — J9611 Chronic respiratory failure with hypoxia: Secondary | ICD-10-CM | POA: Diagnosis not present

## 2021-01-16 DIAGNOSIS — I482 Chronic atrial fibrillation, unspecified: Secondary | ICD-10-CM | POA: Insufficient documentation

## 2021-01-16 DIAGNOSIS — I4819 Other persistent atrial fibrillation: Secondary | ICD-10-CM | POA: Diagnosis not present

## 2021-01-16 DIAGNOSIS — I110 Hypertensive heart disease with heart failure: Secondary | ICD-10-CM | POA: Insufficient documentation

## 2021-01-16 DIAGNOSIS — J189 Pneumonia, unspecified organism: Secondary | ICD-10-CM | POA: Diagnosis not present

## 2021-01-16 DIAGNOSIS — I4891 Unspecified atrial fibrillation: Secondary | ICD-10-CM | POA: Insufficient documentation

## 2021-01-16 LAB — CBC
HCT: 38 % (ref 36.0–46.0)
Hemoglobin: 11.1 g/dL — ABNORMAL LOW (ref 12.0–15.0)
MCH: 26.3 pg (ref 26.0–34.0)
MCHC: 29.2 g/dL — ABNORMAL LOW (ref 30.0–36.0)
MCV: 90 fL (ref 80.0–100.0)
Platelets: 628 10*3/uL — ABNORMAL HIGH (ref 150–400)
RBC: 4.22 MIL/uL (ref 3.87–5.11)
RDW: 21.7 % — ABNORMAL HIGH (ref 11.5–15.5)
WBC: 18.5 10*3/uL — ABNORMAL HIGH (ref 4.0–10.5)
nRBC: 0 % (ref 0.0–0.2)

## 2021-01-16 LAB — URINALYSIS, ROUTINE W REFLEX MICROSCOPIC
Bilirubin Urine: NEGATIVE
Glucose, UA: NEGATIVE mg/dL
Hgb urine dipstick: NEGATIVE
Ketones, ur: NEGATIVE mg/dL
Nitrite: NEGATIVE
Protein, ur: NEGATIVE mg/dL
Specific Gravity, Urine: 1.005 (ref 1.005–1.030)
pH: 7 (ref 5.0–8.0)

## 2021-01-16 LAB — APTT: aPTT: >200 seconds (ref 24–36)

## 2021-01-16 LAB — BASIC METABOLIC PANEL
Anion gap: 12 (ref 5–15)
BUN: 19 mg/dL (ref 8–23)
CO2: 26 mmol/L (ref 22–32)
Calcium: 9.1 mg/dL (ref 8.9–10.3)
Chloride: 98 mmol/L (ref 98–111)
Creatinine, Ser: 0.84 mg/dL (ref 0.44–1.00)
GFR, Estimated: >60 mL/min (ref >60)
Glucose, Bld: 116 mg/dL — ABNORMAL HIGH (ref 70–99)
Potassium: 4.3 mmol/L (ref 3.5–5.1)
Sodium: 136 mmol/L (ref 135–145)

## 2021-01-16 LAB — MAGNESIUM: Magnesium: 2.2 mg/dL (ref 1.7–2.4)

## 2021-01-16 LAB — PROTIME-INR
INR: >10 (ref 0.8–1.2)
Prothrombin Time: >90 seconds — ABNORMAL HIGH (ref 11.4–15.2)

## 2021-01-16 LAB — TSH: TSH: 1.178 u[IU]/mL (ref 0.350–4.500)

## 2021-01-16 IMAGING — DX DG CHEST 1V PORT
1 series · 1 of 1 positions shown · non-contrast
Comparison: Chest x-ray dated [DATE].

CLINICAL DATA: Atrial fibrillation.  Weakness.

EXAM:
PORTABLE CHEST 1 VIEW

[chest]
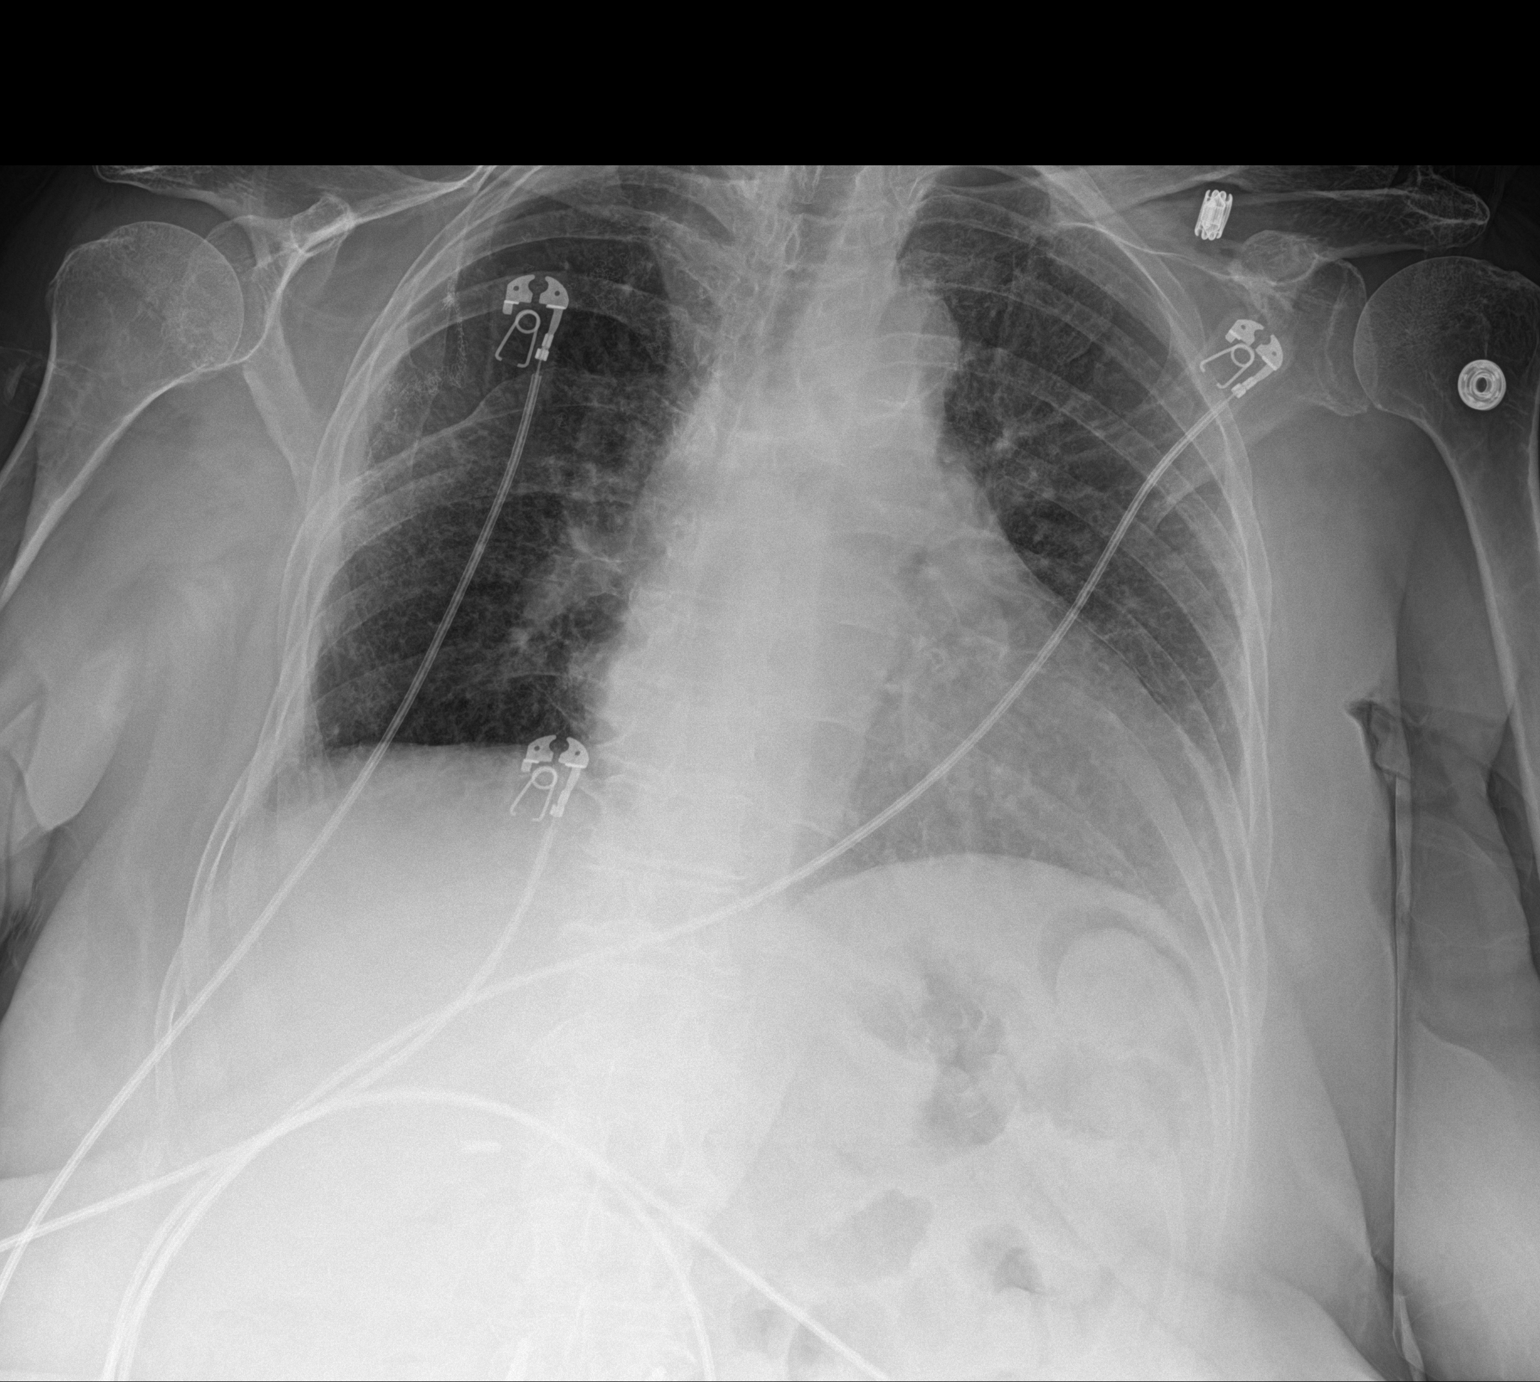

[1 of 1 positions shown; findings below may reference images not displayed]

FINDINGS: Unchanged mild cardiomegaly. Improving interstitial opacities
superimposed on chronic interstitial thickening. No consolidation,
pneumothorax, or large pleural effusion. Postsurgical changes in the
right lung. No acute osseous abnormality.
IMPRESSION: 1. Improving congestive heart failure superimposed on chronic
interstitial lung disease.

## 2021-01-16 MED ORDER — ETOMIDATE 2 MG/ML IV SOLN: 5.0000 mg | Freq: Once | INTRAVENOUS | Status: AC

## 2021-01-16 MED ORDER — DILTIAZEM LOAD VIA INFUSION
10.0000 mg | Freq: Once | INTRAVENOUS | Status: AC
  Administered 2021-01-16: 10 mg via INTRAVENOUS
  Filled 2021-01-16: qty 10

## 2021-01-16 MED ORDER — ETOMIDATE 2 MG/ML IV SOLN
INTRAVENOUS | Status: AC
  Administered 2021-01-16: 5 mg via INTRAVENOUS
  Filled 2021-01-16: qty 10

## 2021-01-16 MED ORDER — PROPOFOL 10 MG/ML IV BOLUS: 0.5000 mg/kg | Freq: Once | INTRAVENOUS | Status: DC

## 2021-01-16 MED ORDER — FENTANYL CITRATE PF 50 MCG/ML IJ SOSY
50.0000 ug | PREFILLED_SYRINGE | Freq: Once | INTRAMUSCULAR | Status: AC
  Administered 2021-01-16: 50 ug via INTRAVENOUS
  Filled 2021-01-16: qty 1

## 2021-01-16 MED ORDER — DILTIAZEM HCL-DEXTROSE 125-5 MG/125ML-% IV SOLN (PREMIX)
5.0000 mg/h | INTRAVENOUS | Status: DC
  Administered 2021-01-16: 5 mg/h via INTRAVENOUS
  Filled 2021-01-16: qty 125

## 2021-01-16 NOTE — Assessment & Plan Note (Signed)
-   Continue supplemental oxygen 2 at rest and 4L with exertion to maintain O2 between 90-95%

## 2021-01-16 NOTE — Assessment & Plan Note (Signed)
-   Patient was noted to be back in Afib with RVR.  Maintained on Eliquis 5mg  BID and Amiodarone 200mg  BID.  She has not missed any doses. Discussed with Dr. Carlyle Dolly with cardiology who recommend ED for cardioversion. They are working on getting her an outpatient apt with EP.

## 2021-01-16 NOTE — ED Provider Notes (Addendum)
Emergency Medicine Provider Triage Evaluation Note  Monique Robles , a 80 y.o. female  was evaluated in triage.  Pt complains of A. fib RVR. Was seen in pulmonology office today for evaluation of atrial fibrillation and generalized weakness.  Per patient's daughter, patient has been experiencing increased generalized weakness this morning.  She was seen at her doctor's office and found to be having a heart rate of 130.  She was previously seen in the emergency department 2 times over the past couple of months where she was also in atrial fibrillation and required cardioversion.  She 1 of these time she had admission to the ICU.  She is on amiodarone, and Eliquis.  She is taking these as prescribed daily.  Denies any changes recently such as infectious symptoms, change in medication regimen, or other symptoms. She is currently on 2 L [place] O2, which is baseline home rate.  Review of Systems  Positive: generalized weakness Negative: see above  Physical Exam  BP 122/81 (BP Location: Left Arm)   Pulse (!) 115   Temp 97.8 F (36.6 C) (Oral)   Resp 19   SpO2 100%  Gen:   Awake, no distress   Resp:  Normal effort  MSK:   Moves extremities without difficulty  Other:  Heart rate tachycardic and irregular.  No murmurs.  Lungs clear to auscultation.  Abdomen soft nontender  Medical Decision Making  Medically screening exam initiated at 12:47 PM.  Appropriate orders placed.  Miraya Cudney was informed that the remainder of the evaluation will be completed by another provider, this initial triage assessment does not replace that evaluation, and the importance of remaining in the ED until their evaluation is complete.  A fib Labs ordered. Patient will go to room as soon as one is available.    Adolphus Birchwood, PA-C 01/16/21 1249    Sheila Oats 01/16/21 1252    Teressa Lower, MD 01/16/21 [phone]

## 2021-01-16 NOTE — Assessment & Plan Note (Signed)
Continue lasix 40 mg daily.  

## 2021-01-16 NOTE — Assessment & Plan Note (Addendum)
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 No acute respiratory complaints; Breathing is unlabored today in office - Currently on prednisone taper ($RemoveBeforeDEI'40mg'RNpYSkAAlaGzoNKz$  x 1 week; $Remov'30mg'LBFlCQ$  x 1 week; $Remov'20mg'yotXWd$  x 1 week; $Remov'10mg'BPNqMb$  x 1 week) - Needs follow-up CXR and ESR today (goal <20)

## 2021-01-16 NOTE — Patient Instructions (Addendum)
  Recommendations: Continue supplemental oxygen 2 at rest and 4L with exertion to maintain O2 between 90-95% Continue Eliquis, amiodarone and lasix per cardiology  Continue physical therapy as long as she remains in sinus rhythm (please do not titrate O2 <2L)  Continue prednisone taper (40mg  x 1 week; 30mg  x 1 week; 20mg  x 1 week; 10mg  x 1 week)  Orders: EKG (done) Labs today (ordered) CXR today (ordered)  Follow-up: 4-8 weeks with Dr. Melvyn Novas only / [place] ED for cardioversion

## 2021-01-16 NOTE — ED Provider Notes (Signed)
I received the patient in signout from Dr. Alvino Chapel, briefly the patient is a 80 year old female with a chief complaints of atrial fibrillation with RVR.  The patient was cardioverted plan to await the urine and rediscussed the results with the family.  The urine is resulted and is not obviously infected.  Patient and family are wanting and willing to go home.   Deno Etienne, DO 01/16/21 1648

## 2021-01-16 NOTE — ED Notes (Signed)
Monique Robles was placed on patient, with suction on 80.

## 2021-01-16 NOTE — ED Provider Notes (Signed)
[facility] EMERGENCY DEPARTMENT Provider Note   CSN: [id number] Arrival date & time: 01/16/21  1213     History Chief Complaint  Patient presents with   Weakness    Monique Robles is a 80 y.o. female.   Weakness Associated symptoms: cough   Associated symptoms: no abdominal pain and no shortness of breath   Patient sent from pulmonology office for atrial flutter with RVR.  Does have history of paroxysmal A. fib.  Has been cardioverted twice in the last 3 weeks.  Is on Eliquis and has been compliant with it.  No fevers.  Has been a little weaker after the visit to the office.  No chest pain.  No coughing.  States she is feeling well in terms of fluid.  Sent in for cardioversion by cardiology.  Is on amiodarone but does have a history of lung disease from pneumonitis from [place].  Has had her Eliquis today.    Past Medical History:  Diagnosis Date   Aortic insufficiency    Atrial fibrillation (HCC)    Diastolic dysfunction    DOE (dyspnea on exertion)    GERD (gastroesophageal reflux disease)    History of pneumonia    History of pulmonary embolism    History of stroke    HTN (hypertension)    Hyperlipemia    Mitral valve regurgitation    Osteoporosis    Tremor    Vitamin D deficiency     Patient Active Problem List   Diagnosis Date Noted   A-fib ([place]) 01/16/2021   CHF (congestive heart failure) ([place]) 01/16/2021   Severe sepsis ([place]) 12/31/2020   Chronic respiratory failure with hypoxia ([place]) 12/06/2020   DOE (dyspnea on exertion) 12/05/2020   Esophageal dysphagia    Chronic lung disease    Drug-induced pneumonitis    Iron deficiency anemia due to chronic blood loss 11/23/2020    Class: Chronic   Acute respiratory failure with hypoxia ([place]) 11/21/2020   GERD (gastroesophageal reflux disease) 11/21/2020   Bronchitis 11/21/2020   Obesity (BMI 30-39.9) 11/21/2020   Insomnia 11/21/2020   Acute respiratory failure ([place]) 11/21/2020    Overactive bladder 08/24/2020   Acute cystitis without hematuria 08/24/2020   Chronic cystitis with hematuria 08/24/2020   Essential tremor 08/15/2019   Cerebrovascular accident (CVA) ([place]) 08/15/2019   Gait abnormality 08/15/2019    Past Surgical History:  Procedure Laterality Date   BALLOON DILATION N/A 11/28/2020   Procedure: BALLOON DILATION;  Surgeon: Rogene Houston, MD;  Location: [facility] ENDO SUITE;  Service: Endoscopy;  Laterality: N/A;   BLADDER SURGERY     x 3   CARDIOVERSION N/A 01/04/2021   Procedure: CARDIOVERSION;  Surgeon: Arnoldo Lenis, MD;  Location: [facility] ORS;  Service: Endoscopy;  Laterality: N/A;   CERVICAL SPINE SURGERY     CHOLECYSTECTOMY     COLONOSCOPY     COLONOSCOPY WITH ESOPHAGOGASTRODUODENOSCOPY (EGD)     ESOPHAGOGASTRODUODENOSCOPY (EGD) WITH PROPOFOL N/A 11/28/2020   Procedure: ESOPHAGOGASTRODUODENOSCOPY (EGD) WITH PROPOFOL;  Surgeon: Rogene Houston, MD;  Location: [facility] ENDO SUITE;  Service: Endoscopy;  Laterality: N/A;   LUMBAR SPINE SURGERY     REPLACEMENT TOTAL KNEE Left    RIGHT LUNG WEDGE REMOVAL     TONSILLECTOMY       OB History   No obstetric history on file.     Family History  Problem Relation Age of Onset   Alzheimer's disease Mother        died at 45  Cancer Mother    Tremor Mother    Pneumonia Father    Heart disease Father    Diabetes Father    Heart attack Father        died at 54    Social History   Tobacco Use   Smoking status: Never   Smokeless tobacco: Never  Vaping Use   Vaping Use: Never used  Substance Use Topics   Alcohol use: Not Currently   Drug use: Never    Home Medications Prior to Admission medications   Medication Sig Start Date End Date Taking? Authorizing Provider  Acetaminophen (TYLENOL ARTHRITIS PAIN PO) Take 650 mg by mouth in the morning, at noon, and at bedtime.    [provider]  amiodarone (PACERONE) 200 MG tablet Take 1 tablet (200 mg total) by mouth 2 (two) times daily. 01/07/21    Kathie Dike, MD  apixaban (ELIQUIS) 5 MG TABS tablet Take 5 mg by mouth 2 (two) times daily.    [provider]  calcium carbonate (TUMS - DOSED IN MG ELEMENTAL CALCIUM) 500 MG chewable tablet Chew 1 tablet by mouth daily as needed for indigestion or heartburn.    [provider]  cholecalciferol (VITAMIN D3) 25 MCG (1000 UNIT) tablet Take 1,000 Units by mouth daily.    [provider]  cyclobenzaprine (FLEXERIL) 5 MG tablet Take 5 mg by mouth daily as needed. 07/31/20   [provider]  diphenhydrAMINE (BENADRYL) 25 MG tablet Take 25 mg by mouth daily as needed for allergies.    [provider]  docusate sodium (COLACE) 100 MG capsule Take 1 capsule (100 mg total) by mouth 2 (two) times daily. 01/07/21   Kathie Dike, MD  ferrous sulfate 325 (65 FE) MG EC tablet Take 1 tablet (325 mg total) by mouth 2 (two) times daily. 01/07/21 01/07/22  Kathie Dike, MD  furosemide (LASIX) 20 MG tablet Take 2 tablets (40 mg total) by mouth daily. 01/07/21   Kathie Dike, MD  gabapentin (NEURONTIN) 100 MG capsule Take 200 mg by mouth 3 (three) times daily.    [provider]  HYDROcodone-acetaminophen (NORCO/VICODIN) 5-325 MG tablet Take 1 tablet by mouth 2 (two) times daily as needed for moderate pain. 11/29/20   Amin, Jeanella Flattery, MD  Lactobacillus TABS Take 1 tablet by mouth with breakfast, with lunch, and with evening meal.    [provider]  levalbuterol (XOPENEX) 1.25 MG/0.5ML nebulizer solution Take 1.25 mg by nebulization every 6 (six) hours as needed for wheezing or shortness of breath. 11/29/20   Amin, Jeanella Flattery, MD  Melatonin 10 MG TABS Take 10 mg by mouth at bedtime.    [provider]  midodrine (PROAMATINE) 5 MG tablet Take 5 mg by mouth 3 (three) times daily with meals.    [provider]  Multiple Vitamin (MULTIVITAMIN) tablet Take 1 tablet by mouth daily.    [provider]  omeprazole  (PRILOSEC) 40 MG capsule Take 1 capsule by mouth daily. 06/21/20   [provider]  predniSONE (DELTASONE) 20 MG tablet Take 40mg  po daily and decrease by 10mg  every 7 days until complete 01/07/21   Kathie Dike, MD  senna-docusate (SENOKOT-S) 8.6-50 MG tablet Take 2 tablets by mouth at bedtime as needed for moderate constipation. 11/29/20   Amin, Jeanella Flattery, MD  sucralfate (CARAFATE) 1 g tablet Take 1 g by mouth at bedtime.    [provider]  Vibegron (GEMTESA) 75 MG TABS Take 1 capsule by mouth  daily. 09/20/20   Cleon Gustin, MD    Allergies    Coumadin [warfarin], Atorvastatin, Ciprofloxacin, Macrobid [nitrofurantoin], and Sulfa antibiotics  Review of Systems   Review of Systems  Constitutional:  Negative for appetite change.  HENT:  Negative for congestion.   Respiratory:  Positive for cough. Negative for shortness of breath.   Cardiovascular:  Positive for palpitations. Negative for leg swelling.  Gastrointestinal:  Negative for abdominal pain.  Musculoskeletal:  Negative for back pain.  Skin:  Negative for rash.  Neurological:  Positive for weakness.  Psychiatric/Behavioral:  Negative for confusion.    Physical Exam Updated Vital Signs BP 108/72 (BP Location: Left Arm)   Pulse 62   Temp 97.8 F (36.6 C) (Oral)   Resp 16   Ht 5\' 1"  (1.549 m)   Wt 81.1 kg   SpO2 100%   BMI 33.78 kg/m   Physical Exam Vitals and nursing note reviewed.  HENT:     Head: Atraumatic.  Cardiovascular:     Rate and Rhythm: Tachycardia present. Rhythm irregular.  Pulmonary:     Breath sounds: No wheezing.     Comments: Unchanged cough Abdominal:     Tenderness: There is no abdominal tenderness.  Musculoskeletal:     Cervical back: Neck supple.     Right lower leg: Edema present.     Left lower leg: Edema present.  Skin:    General: Skin is warm.     Capillary Refill: Capillary refill takes less than 2 seconds.  Neurological:     Mental Status: She is alert.      Comments: Awake and at baseline    ED Results / Procedures / Treatments   Labs (all labs ordered are listed, but only abnormal results are displayed) Labs Reviewed  BASIC METABOLIC PANEL - Abnormal; Notable for the following components:      Result Value   Glucose, Bld 116 (*)    All other components within normal limits  CBC - Abnormal; Notable for the following components:   WBC 18.5 (*)    Hemoglobin 11.1 (*)    MCHC 29.2 (*)    RDW 21.7 (*)    Platelets 628 (*)    All other components within normal limits  PROTIME-INR - Abnormal; Notable for the following components:   Prothrombin Time >90.0 (*)    INR >10.0 (*)    All other components within normal limits  APTT - Abnormal; Notable for the following components:   aPTT >200 (*)    All other components within normal limits  MAGNESIUM  TSH  URINALYSIS, ROUTINE W REFLEX MICROSCOPIC    EKG None  Radiology DG Chest Port 1 View  Result Date: 01/16/2021 CLINICAL DATA:  Atrial fibrillation.  Weakness. EXAM: PORTABLE CHEST 1 VIEW COMPARISON:  Chest x-ray dated January 03, 2021. FINDINGS: Unchanged mild cardiomegaly. Improving interstitial opacities superimposed on chronic interstitial thickening. No consolidation, pneumothorax, or large pleural effusion. Postsurgical changes in the right lung. No acute osseous abnormality. IMPRESSION: 1. Improving congestive heart failure superimposed on chronic interstitial lung disease. Electronically Signed   By: Titus Dubin M.D.   On: 01/16/2021 13:20    Procedures .Cardioversion  Date/Time: 01/16/2021 4:10 PM Performed by: Davonna Belling, MD Authorized by: Davonna Belling, MD   Consent:    Consent obtained:  Written   Consent given by:  Patient   Risks discussed:  Cutaneous burn, death, induced arrhythmia and pain   Alternatives discussed:  No treatment, anti-coagulation medication, rate-control medication,  alternative treatment, delayed treatment and  observation Pre-procedure details:    Cardioversion basis:  Elective   Rhythm:  Atrial flutter   Electrode placement:  Anterior-posterior Patient sedated: Yes. Refer to sedation procedure documentation for details of sedation.  Attempt one:    Cardioversion mode:  Synchronous   Waveform:  Biphasic   Shock (Joules):  150   Shock outcome:  Conversion to normal sinus rhythm Post-procedure details:    Patient status:  Awake   Patient tolerance of procedure:  Tolerated well, no immediate complications .Sedation  Date/Time: 01/16/2021 4:11 PM Performed by: Davonna Belling, MD Authorized by: Davonna Belling, MD   Consent:    Consent obtained:  Written   Consent given by:  Patient   Risks discussed:  Allergic reaction, dysrhythmia, nausea, inadequate sedation, vomiting, respiratory compromise necessitating ventilatory assistance and intubation and prolonged hypoxia resulting in organ damage   Alternatives discussed:  Analgesia without sedation and anxiolysis Universal protocol:    Procedure explained and questions answered to patient or proxy's satisfaction: yes     Immediately prior to procedure, a time out was called: yes     Patient identity confirmed:  Verbally with patient and arm band Indications:    Procedure performed:  Cardioversion   Procedure necessitating sedation performed by:  Physician performing sedation Pre-sedation assessment:    Time since last food or drink:  4   ASA classification: class 4 - patient with severe systemic disease that is a constant threat to life     Mouth opening:  3 or more finger widths   Thyromental distance:  3 finger widths   Mallampati score:  III - soft palate, base of uvula visible   Neck mobility: normal     Pre-sedation assessments completed and reviewed: airway patency, cardiovascular function, hydration status, mental status, pain level, respiratory function and temperature     Pre-sedation assessments completed and reviewed:  pre-procedure nausea and vomiting status not reviewed   Immediate pre-procedure details:    Reassessment: Patient reassessed immediately prior to procedure     Reviewed: vital signs, relevant labs/tests and NPO status     Verified: bag valve mask available, emergency equipment available, intubation equipment available, IV patency confirmed, oxygen available and suction available   Procedure details (see MAR for exact dosages):    Preoxygenation:  Nasal cannula   Sedation:  Etomidate   Intended level of sedation: deep   Analgesia:  Fentanyl   Intra-procedure monitoring:  Blood pressure monitoring, frequent LOC assessments, frequent vital sign checks, continuous pulse oximetry and cardiac monitor   Intra-procedure events: none     Total Provider sedation time (minutes):  10 Post-procedure details:    Post-sedation assessment completed:  01/16/2021 4:12 PM   Attendance: Constant attendance by certified staff until patient recovered     Recovery: Patient returned to pre-procedure baseline     Post-sedation assessments completed and reviewed: airway patency, cardiovascular function, hydration status, mental status, pain level, respiratory function and temperature     Post-sedation assessments completed and reviewed: post-procedure nausea and vomiting status not reviewed     Patient is stable for discharge or admission: yes     Procedure completion:  Tolerated well, no immediate complications   Medications Ordered in ED Medications  diltiazem (CARDIZEM) 1 mg/mL load via infusion 10 mg (10 mg Intravenous Bolus from Bag 01/16/21 1338)    And  diltiazem (CARDIZEM) 125 mg in dextrose 5% 125 mL (1 mg/mL) infusion (0 mg/hr Intravenous Stopped 01/16/21 1515)  fentaNYL (  SUBLIMAZE) injection 50 mcg (50 mcg Intravenous Given 01/16/21 1516)  etomidate (AMIDATE) injection 5 mg (5 mg Intravenous Given 01/16/21 1521)    ED Course  I have reviewed the triage vital signs and the nursing notes.  Pertinent labs  & imaging results that were available during my care of the patient were reviewed by me and considered in my medical decision making (see chart for details).    MDM Rules/Calculators/A&P                           Patient with atrial flutter with RVR.  Does have a history of paroxysmal A. fib.  2 cardioversions the last 3 weeks.  Cardioverted in the ER successfully after discussion with cardiology.  They will have follow-up with electrophysiology.  White count elevated but is on steroids urinalysis still pending.  Chest x-ray improved.  Does have an elevated INR, however is only on Eliquis.  Discussed with electrophysiology and after the next month of anticoagulation may reconsider anticoagulation but will stay on it for now.  Hopefully should be able to discharge home.  Care turned over to Dr. Tyrone Nine.   CRITICAL CARE Performed by: Davonna Belling Total critical care time: 30 minutes Critical care time was exclusive of separately billable procedures and treating other patients. Critical care was necessary to treat or prevent imminent or life-threatening deterioration. Critical care was time spent personally by me on the following activities: development of treatment plan with patient and/or surrogate as well as nursing, discussions with consultants, evaluation of patient's response to treatment, examination of patient, obtaining history from patient or surrogate, ordering and performing treatments and interventions, ordering and review of laboratory studies, ordering and review of radiographic studies, pulse oximetry and re-evaluation of patient's condition.    Final Clinical Impression(s) / ED Diagnoses Final diagnoses:  Atrial flutter with rapid ventricular response [facility])    Rx / DC Orders ED Discharge Orders     None        Davonna Belling, MD 01/16/21 1612

## 2021-01-16 NOTE — ED Triage Notes (Signed)
Patient sent to [facility] from pulmonologist for evaluation of atrial fibrillation and generalized weakness. History of afib but is not usually in afib, Patient currently in afib with rate of 130. Patient alert, oriented, and in no apparently distress at this time. Patient wears 2L O2 [place] at baseline, takes amiodarone and eliquis.

## 2021-01-16 NOTE — Consult Note (Addendum)
Cardiology Consultation:   Patient ID: Monique Robles MRN: [id number]; DOB: [birth year]  Admit date: 01/16/2021 Date of Consult: 01/16/2021  PCP:  Celene Squibb, MD   [facility] Providers Cardiologist:  Dorris Carnes, MD   {   Patient Profile:   Monique Robles is a 80 y.o. female with a hx of stroke, Afib, tremor, GERD/dysphagia with prior esophageal dilation who is being seen 01/16/2021 for the evaluation of recurrent Afib/RVR at the request of Dr. Alvino Chapel.  History of Present Illness:   Monique Robles has had a number of hospitalizations of late, back-forth from rehab with marked functional decline. Sha hs been started on midodrine for hypotension, it seems at rehab, her daugher reports dizziness, lightheadedness with using the bathroom as an example.   Sept 2022 she had an Admission diagnosis acute respiratory failure with hypoxia.  She was diagnosed with likely bronchitis with intermittent fluid overload requiring Lasix.  There was a possibility she may have drug-related toxicity from nitrofurantoin.  She was seen by pulmonary.  Hospital stay was complicated by atrial fibrillation RVR.  She was started on Cardizem drip then propranolol.  Due to dysphagia she had barium swallow study which showed esophageal motility disorder with plans for endoscopy.  EGD showed benign esophageal stenosis requiring dilation.  Eliquis was resumed on 11/29/2020.   10/20 At an office visit with cardiology f/u found in AFib w/RVR and sent to the ER for DCCV  Back in the hospital 12/31/20 progressive SOB again in rapid Afib, she was started on amiodarone gtt, not felt to have other AAD after a couple days of amiodarone, underwent DCCV 01/04/21 This admission had a UTI, pneumonia AHF exacerbation 2/2 RVR There is a mention of stopping her nadolol 2/2 bradycardia in one note, but all of the other notes that I have reviewed I do not see bradycardia but issues with lower BPs Finally discharged  01/07/21  Since her Hospitalization in Sept she has had steady decline in functional status, though working with PT Not ambulatory on her own currently, though had been  Pulmonary notes: chest imaging consistent acute pneumonitis and more chronic fibrosis Chronic hypoxic respiratory failure 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 On prednisone planned to taper  TODAY she was at her pulmonary visit very weak (though has been) and found in AFib w/RVR Sent to the ER   LABS K+ 4.3 Mag 2.2  BUN/Creat 19/0.84 WBC 18.5 H/H 11/38 Plts 628  PT >90 INR >10 PTT >200 TSH 1.178  CXR Improving congestive heart failure superimposed on chronic interstitial lung disease  In the ER, she is planned for DCCV     Past Medical History:  Diagnosis Date   Aortic insufficiency    Atrial fibrillation (HCC)    Diastolic dysfunction    DOE (dyspnea on exertion)    GERD (gastroesophageal reflux disease)    History of pneumonia    History of pulmonary embolism    History of stroke    HTN (hypertension)    Hyperlipemia    Mitral valve regurgitation    Osteoporosis    Tremor    Vitamin D deficiency     Past Surgical History:  Procedure Laterality Date   BALLOON DILATION N/A 11/28/2020   Procedure: BALLOON DILATION;  Surgeon: Rogene Houston, MD;  Location: [facility];  Service: Endoscopy;  Laterality: N/A;   BLADDER SURGERY     x 3   CARDIOVERSION N/A 01/04/2021   Procedure: CARDIOVERSION;  Surgeon: Harl Bowie,  Alphonse Guild, MD;  Location: [facility];  Service: Endoscopy;  Laterality: N/A;   CERVICAL SPINE SURGERY     CHOLECYSTECTOMY     COLONOSCOPY     COLONOSCOPY WITH ESOPHAGOGASTRODUODENOSCOPY (EGD)     ESOPHAGOGASTRODUODENOSCOPY (EGD) WITH PROPOFOL N/A 11/28/2020   Procedure: ESOPHAGOGASTRODUODENOSCOPY (EGD) WITH PROPOFOL;  Surgeon: Rogene Houston, MD;  Location: [facility];  Service: Endoscopy;  Laterality: N/A;   LUMBAR SPINE SURGERY      REPLACEMENT TOTAL KNEE Left    RIGHT LUNG WEDGE REMOVAL     TONSILLECTOMY       Home Medications:  Prior to Admission medications   Medication Sig Start Date End Date Taking? Authorizing Provider  Acetaminophen (TYLENOL ARTHRITIS PAIN PO) Take 650 mg by mouth in the morning, at noon, and at bedtime.    [provider]  amiodarone (PACERONE) 200 MG tablet Take 1 tablet (200 mg total) by mouth 2 (two) times daily. 01/07/21   Kathie Dike, MD  apixaban (ELIQUIS) 5 MG TABS tablet Take 5 mg by mouth 2 (two) times daily.    [provider]  calcium carbonate (TUMS - DOSED IN MG ELEMENTAL CALCIUM) 500 MG chewable tablet Chew 1 tablet by mouth daily as needed for indigestion or heartburn.    [provider]  cholecalciferol (VITAMIN D3) 25 MCG (1000 UNIT) tablet Take 1,000 Units by mouth daily.    [provider]  cyclobenzaprine (FLEXERIL) 5 MG tablet Take 5 mg by mouth daily as needed. 07/31/20   [provider]  diphenhydrAMINE (BENADRYL) 25 MG tablet Take 25 mg by mouth daily as needed for allergies.    [provider]  docusate sodium (COLACE) 100 MG capsule Take 1 capsule (100 mg total) by mouth 2 (two) times daily. 01/07/21   Kathie Dike, MD  ferrous sulfate 325 (65 FE) MG EC tablet Take 1 tablet (325 mg total) by mouth 2 (two) times daily. 01/07/21 01/07/22  Kathie Dike, MD  furosemide (LASIX) 20 MG tablet Take 2 tablets (40 mg total) by mouth daily. 01/07/21   Kathie Dike, MD  gabapentin (NEURONTIN) 100 MG capsule Take 200 mg by mouth 3 (three) times daily.    [provider]  HYDROcodone-acetaminophen (NORCO/VICODIN) 5-325 MG tablet Take 1 tablet by mouth 2 (two) times daily as needed for moderate pain. 11/29/20   Amin, Jeanella Flattery, MD  Lactobacillus TABS Take 1 tablet by mouth with breakfast, with lunch, and with evening meal.    [provider]  levalbuterol (XOPENEX) 1.25 MG/0.5ML nebulizer solution Take  1.25 mg by nebulization every 6 (six) hours as needed for wheezing or shortness of breath. 11/29/20   Amin, Jeanella Flattery, MD  Melatonin 10 MG TABS Take 10 mg by mouth at bedtime.    [provider]  midodrine (PROAMATINE) 5 MG tablet Take 5 mg by mouth 3 (three) times daily with meals.    [provider]  Multiple Vitamin (MULTIVITAMIN) tablet Take 1 tablet by mouth daily.    [provider]  omeprazole (PRILOSEC) 40 MG capsule Take 1 capsule by mouth daily. 06/21/20   [provider]  predniSONE (DELTASONE) 20 MG tablet Take 40mg  po daily and decrease by 10mg  every 7 days until complete 01/07/21   Kathie Dike, MD  senna-docusate (SENOKOT-S) 8.6-50 MG tablet Take 2 tablets by mouth at bedtime as needed for moderate constipation. 11/29/20   Amin, Jeanella Flattery, MD  sucralfate (CARAFATE) 1 g tablet Take 1 g by mouth at bedtime.  [provider]  Vibegron (GEMTESA) 75 MG TABS Take 1 capsule by mouth daily. 09/20/20   Cleon Gustin, MD    Inpatient Medications: Scheduled Meds:  Continuous Infusions:  diltiazem (CARDIZEM) infusion 5 mg/hr (01/16/21 1338)   PRN Meds:   Allergies:    Allergies  Allergen Reactions   Coumadin [Warfarin] Hives   Atorvastatin Other (See Comments)    Muscle aches   Ciprofloxacin    Macrobid [Nitrofurantoin]     Cause respiratory flare   Sulfa Antibiotics Itching and Rash    Social History:   Social History   Socioeconomic History   Marital status: Widowed    Spouse name: Not on file   Number of children: 3   Years of education: 12   Highest education level: High school graduate  Occupational History   Occupation: Retired  Tobacco Use   Smoking status: Never   Smokeless tobacco: Never  Vaping Use   Vaping Use: Never used  Substance and Sexual Activity   Alcohol use: Not Currently   Drug use: Never   Sexual activity: Not on file  Other Topics Concern   Not on file  Social History Narrative    Lives with her daughter.   Right-handed.   Rare caffeine use.   Social Determinants of Health   Financial Resource Strain: Not on file  Food Insecurity: Not on file  Transportation Needs: Not on file  Physical Activity: Not on file  Stress: Not on file  Social Connections: Not on file  Intimate Partner Violence: Not on file    Family History:   Family History  Problem Relation Age of Onset   Alzheimer's disease Mother        died at 50   Cancer Mother    Tremor Mother    Pneumonia Father    Heart disease Father    Diabetes Father    Heart attack Father        died at 52     ROS:  Please see the history of present illness.  All other ROS reviewed and negative.     Physical Exam/Data:   Vitals:   01/16/21 1330 01/16/21 1341 01/16/21 1343 01/16/21 1345  BP: 109/90  96/66 99/69  Pulse:   (!) 136   Resp: (!) 26  (!) 24 (!) 27  Temp:      TempSrc:      SpO2: 99%  100% 100%  Weight:  81.1 kg    Height:  5\' 1"  (1.549 m)     No intake or output data in the 24 hours ending 01/16/21 1421 Last 3 Weights 01/16/2021 01/16/2021 01/07/2021  Weight (lbs) 178 lb 12.8 oz 178 lb 12.8 oz 188 lb 11.4 oz  Weight (kg) 81.103 kg 81.103 kg 85.6 kg     Body mass index is 33.78 kg/m.  General:  Well nourished, well developed, in no acute distres, though appears weak and chronically ill HEENT: normal Neck: no JVD Vascular: No carotid bruits; Distal pulses 2+ bilaterally Cardiac:  irreg-irreg, tachycardic; no murmurs, gallops or rubs Lungs:  no overt crackles or wheezing no rhonchi or rales  Abd: soft, nontender Ext: no edema Musculoskeletal:  No deformities, advanced atrophy Skin: warm and dry  Neuro:  no focal abnormalities noted Psych:  Normal affect   EKG:  The EKG was personally reviewed and demonstrates:   Coarse AFib/atypical Aflutter 133bpm  Telemetry:  Telemetry was personally reviewed and demonstrates:   AFib,  on dilt gtt 110's-120's  Relevant CV  Studies:  01/03/21; limited TTE IMPRESSIONS   1. Left ventricular ejection fraction, by estimation, is 55 to 60%. The  left ventricle has normal function. Left ventricular diastolic parameters  are indeterminate.   2. Right ventricular systolic function is normal. The right ventricular  size is normal.   3. IVC is small suggesting low RA pressure and hypovolemia.    10/11/20: stress myoview No diagnostic ST segment changes indicate ischemia. No significant myocardial perfusion defect indicate scar or ischemia. This is a low risk study. Nuclear stress EF: 68%.   09/13/20: TTE IMPRESSIONS   1. Left ventricular ejection fraction, by estimation, is 65 to 70%. The  left ventricle has normal function. The left ventricle has no regional  wall motion abnormalities. Left ventricular diastolic parameters are  consistent with Grade I diastolic  dysfunction (impaired relaxation).   2. Right ventricular systolic function is normal. The right ventricular  size is normal. There is normal pulmonary artery systolic pressure.   3. Left atrial size was mildly dilated.   4. The mitral valve is normal in structure. Mild mitral valve  regurgitation.   5. AV is thickened, calcified with very mildly restricted motion. Peak  and mean gradients through the valve are 20 and 10 mm Hg respectively  consistent with mild AS.Marland Kitchen The aortic valve is tricuspid. Aortic valve  regurgitation is not visualized.   6. The inferior vena cava is normal in size with greater than 50%  respiratory variability, suggesting right atrial pressure of 3 mmHg.     Laboratory Data:  High Sensitivity Troponin:   Recent Labs  Lab 12/31/20 1333 12/31/20 1506  TROPONINIHS 9 9     Chemistry Recent Labs  Lab 01/16/21 1249  NA 136  K 4.3  CL 98  CO2 26  GLUCOSE 116*  BUN 19  CREATININE 0.84  CALCIUM 9.1  MG 2.2  GFRNONAA >60  ANIONGAP 12    No results for input(s): PROT, ALBUMIN, AST, ALT, ALKPHOS, BILITOT in the last  168 hours. Lipids No results for input(s): CHOL, TRIG, HDL, LABVLDL, LDLCALC, CHOLHDL in the last 168 hours.  Hematology Recent Labs  Lab 01/16/21 1249  WBC 18.5*  RBC 4.22  HGB 11.1*  HCT 38.0  MCV 90.0  MCH 26.3  MCHC 29.2*  RDW 21.7*  PLT 628*   Thyroid  Recent Labs  Lab 01/16/21 1250  TSH 1.178    BNPNo results for input(s): BNP, PROBNP in the last 168 hours.  DDimer No results for input(s): DDIMER in the last 168 hours.   Radiology/Studies:  DG Chest Port 1 View  Result Date: 01/16/2021 CLINICAL DATA:  Atrial fibrillation.  Weakness. EXAM: PORTABLE CHEST 1 VIEW COMPARISON:  Chest x-ray dated January 03, 2021. FINDINGS: Unchanged mild cardiomegaly. Improving interstitial opacities superimposed on chronic interstitial thickening. No consolidation, pneumothorax, or large pleural effusion. Postsurgical changes in the right lung. No acute osseous abnormality. IMPRESSION: 1. Improving congestive heart failure superimposed on chronic interstitial lung disease. Electronically Signed   By: Titus Dubin M.D.   On: 01/16/2021 13:20     Assessment and Plan:   Afib CHA2DS2Vasc is 8, on Eliquis, appropriately dosed  AAD options limited by lung disease and baseline lower BPs' (on midodrine) She has had HF seems 2/2 RVR TTE (x2) with preserved LVEF, no significant LVH, grade I DD No known hx of CAD and stress test this year with no ischemia or infarct  Dr. Curt Bears has seen the patient  She has  been a few weeks with amio. Given this unable to change AAD right now at least BP has not allowed nodal blockers SBP in the ER 100's-110 Amiodarone not ideal, though Vikrant Pryce need to stick with it at least short term continue I have made EP follow up for her   Her labs notable for markedly abnormal coags Elevated WBC (infection vs steroid?) She has been cardioverted in the ER H/H looks OK D/w ER MD, he Williemae Muriel follow up on the UA though outside of that not felt acutely  ill/infection-wise Aimee Heldman recommend PMD follow up for coagulopathy with no signs of acute bleeding Now s/p DCCV [place] continue eliquis     Risk Assessment/Risk Scores:   For questions or updates, please contact [place] Please consult [email or web address] for contact info under    Signed, Baldwin Jamaica, PA-C  01/16/2021 2:21 PM  I have seen and examined this patient with Tommye Standard.  Agree with above, note added to reflect my findings.  On exam, irregular, tachycardic.  Patient presented to the emergency room after being seen in her pulmonologist office with tachypalpitations.  She was found to be in rapid atrial fibrillation.  She feels weak and fatigued as well as having some mild shortness of breath.  She has a long history of her on amiodarone approximately 1 month ago.  She does have significant pulmonary issues, and thus amiodarone may not be the best medication long-term.  She would be a difficult ablation candidate.  Would plan for cardioversion in the emergency room.  We Janiyha Montufar have her follow-up in [place] with Crissie Sickles to talk about further options such as continued amiodarone versus AV node ablation and pacemaker implant.  Bricen Victory M. Militza Devery MD 01/16/2021 4:28 PM

## 2021-01-16 NOTE — Progress Notes (Signed)
_0  ID: Monique Robles, female    DOB: 1940/08/30, 80 y.o.   MRN: [id number]  Chief Complaint  Patient presents with   Follow-up    Follow up on hospital visit.     Referring provider: Celene Squibb, MD  HPI: 80 year old female, never smoked.  Past medical history significant for induced pneumonitis, chronic lung disease, chronic respiratory failure with hypoxia, congestive heart failure, CVA, GERD, essential tremor, GERD.  Patient of Dr. Melvyn Novas, seen in initial consult 12/05/2020 for dyspnea she does not have demonstrable COPD or asthma.  Previous [facility] pulmonary encounter: 12/05/2020  Pulmonary/ 1st office eval/[facility] ? Macrodantin lung -was on it from Jan 2022 to 11/21/20     Chief Complaint  Patient presents with   Hospitalization Follow-up  Dyspnea:  baseline half way up steps, now SNF and 02 now 2lpm at rest 4-5 lpm with activity  Cough: minimal Sleep: 30 degrees electric bed  SABA use: multiple forms complicated by palpitations ? [facility]     No obvious day to day or daytime variability or assoc excess/ purulent sputum or mucus plugs or hemoptysis or cp or chest tightness, subjective wheeze or overt sinus or hb symptoms.    Sleeping  without nocturnal  or early am exacerbation  of respiratory  c/o's or need for noct saba. Also denies any obvious fluctuation of symptoms with weather or environmental changes or other aggravating or alleviating factors except as outlined above    No unusual exposure hx or h/o childhood pna/ asthma or knowledge of premature birth.   Hospitalization  01/07/21- Pulmonary, Dr. Melvyn Novas  80 yo female with history of recurrent UTI's was on nitrofurantoin therapy from January through September 2022.  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  At the time she had changes on chest imaging consistent acute pneumonitis and more chronic fibrosis.  She was discharged  home on supplemental oxygen and prednisone.  She had outpt follow up with Dr. Melvyn Novas and had prednisone weaned off as of mid oct 2022    She presented to the ER on 12/31/20 with dyspnea.  Found to have worsening hypoxia with SpO2 74% on 5 liters and A fib with RVR (HR 150 to 180).  Found to have recurrent UTI.  Started on amiodarone and antibiotics.  She was treated with diuretics.  Heart rate improved and remained in negative fluid balance.  Still had dyspnea and PCCM asked to assess.  Acute on chronic hypoxic respiratory failure. 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 - sed rate up on 01/04/21 - started prednisone 40 mg daily on 01/04/21; slowly taper off over next few weeks - follow serial sats with activity and ESR with goal of keeping < 20 and adjusting prednisone dose accordingly    A fib with RVR. Acute on chronic diastolic CHF. - s/p cardioversion 01/04/21 >> appears to be maintaining SR  - amiodarone, eliquis per cardiology - not effects of macardantin and amiodarone similar and acute phases of both may elevate ESR  Recurrent UTI. - completed Abx 01/04/21 with neg cultures    Deconditioning. - likely contributing to dyspnea - can probably start PT soon if she remains in sinus rhythm       01/16/2021- Interim hx  Patient  presents today for hospital follow-up. She was admitted on 12/31/2020 - 01/07/2021 for worsening shortness of breath.  She was seen in ED on 12/31/20 and found to have A. fib rapid ventricular rate.  Patient underwent successful cardioversion into normal sinus rhythm and was discharged to rehab.  Patient was readmitted on 10/24 with recurrent atrial fibrillation ER and associated acute on chronic hypoxic respiratory failure in the setting of CHF and sepsis due to UTI.  Patient underwent DCCV on 10/28  and remained in NSR since cardioversion.  She is on oral amiodarone.  Etiology of respiratory failure felt to be multifactorial due to CHF, A. fib, fibrosis.  She was diuresed with IV Lasix and transitioned back to oral diuretics which were increased from 20 mg daily to 40 mg daily.  Patient was started on prolonged course of prednisone for elevated ESR.  Patchy findings on chest imaging is worrisome for underlying infection versus ILD.  She remains supplemental oxygen.  Presents today with her daughter. She is in wheelchair. Her main complaints today is fatigue/weakness. Her breathing appears to be alright. It took 2-3 people to help assist her to the restroom today. She will be seeing cardiology end of this month and neurology in January for tremors.     Allergies  Allergen Reactions   Coumadin [Warfarin] Hives   Atorvastatin Other (See Comments)    Muscle aches   Ciprofloxacin    Macrobid [Nitrofurantoin]     Cause respiratory flare   Sulfa Antibiotics Itching and Rash    There is no immunization history for the selected administration types on file for this patient.  Past Medical History:  Diagnosis Date   Aortic insufficiency    Atrial fibrillation (HCC)    Diastolic dysfunction    DOE (dyspnea on exertion)    GERD (gastroesophageal reflux disease)    History of pneumonia    History of pulmonary embolism    History of stroke    HTN (hypertension)    Hyperlipemia    Mitral valve regurgitation    Osteoporosis    Tremor    Vitamin D deficiency     Tobacco History: Social History   Tobacco Use  Smoking Status Never  Smokeless Tobacco Never   Counseling given: Not Answered   Outpatient Medications Prior to Visit  Medication Sig Dispense Refill   Acetaminophen (TYLENOL ARTHRITIS PAIN PO) Take 650 mg by mouth in the morning, at noon, and at bedtime.     amiodarone (PACERONE) 200 MG tablet Take 1 tablet (200 mg total) by mouth 2 (two) times daily.     apixaban  (ELIQUIS) 5 MG TABS tablet Take 5 mg by mouth 2 (two) times daily.     calcium carbonate (TUMS - DOSED IN MG ELEMENTAL CALCIUM) 500 MG chewable tablet Chew 1 tablet by mouth daily as needed for indigestion or heartburn.     cholecalciferol (VITAMIN D3) 25 MCG (1000 UNIT) tablet Take 1,000 Units by mouth daily.     cyclobenzaprine (FLEXERIL) 5 MG tablet Take 5 mg by mouth daily as needed.     diphenhydrAMINE (BENADRYL) 25 MG tablet Take 25 mg by mouth daily as needed for allergies.     docusate sodium (COLACE) 100 MG capsule Take 1 capsule (100 mg total) by mouth 2 (two) times daily. 10 capsule 0   ferrous sulfate 325 (65 FE) MG EC tablet Take 1 tablet (325 mg total) by mouth 2 (two) times daily. 60 tablet 3   furosemide (LASIX) 20 MG  tablet Take 2 tablets (40 mg total) by mouth daily. 30 tablet    gabapentin (NEURONTIN) 100 MG capsule Take 200 mg by mouth 3 (three) times daily.     HYDROcodone-acetaminophen (NORCO/VICODIN) 5-325 MG tablet Take 1 tablet by mouth 2 (two) times daily as needed for moderate pain. 15 tablet 0   Lactobacillus TABS Take 1 tablet by mouth with breakfast, with lunch, and with evening meal.     levalbuterol (XOPENEX) 1.25 MG/0.5ML nebulizer solution Take 1.25 mg by nebulization every 6 (six) hours as needed for wheezing or shortness of breath. 1 each 12   Melatonin 10 MG TABS Take 10 mg by mouth at bedtime.     midodrine (PROAMATINE) 5 MG tablet Take 5 mg by mouth 3 (three) times daily with meals.     Multiple Vitamin (MULTIVITAMIN) tablet Take 1 tablet by mouth daily.     omeprazole (PRILOSEC) 40 MG capsule Take 1 capsule by mouth daily.     predniSONE (DELTASONE) 20 MG tablet Take 74m po daily and decrease by 163mevery 7 days until complete     senna-docusate (SENOKOT-S) 8.6-50 MG tablet Take 2 tablets by mouth at bedtime as needed for moderate constipation.     sucralfate (CARAFATE) 1 g tablet Take 1 g by mouth at bedtime.     Vibegron (GEMTESA) 75 MG TABS Take 1  capsule by mouth daily. 30 tablet 11   No facility-administered medications prior to visit.    Review of Systems  Review of Systems  Constitutional:  Positive for fatigue.  Respiratory:  Negative for cough, chest tightness and wheezing.     Physical Exam  BP 118/76 (BP Location: Right Arm, Patient Position: Sitting, Cuff Size: Normal)   Pulse 89   Temp 97.9 F (36.6 C) (Oral)   Ht _0  (1.549 m)   Wt 178 lb 12.8 oz (81.1 kg)   SpO2 100%   BMI 33.78 kg/m  Physical Exam Constitutional:      General: She is not in acute distress.    Appearance: Normal appearance. She is not ill-appearing.  HENT:     Head: Normocephalic and atraumatic.     Mouth/Throat:     Comments: Deferred d/t masking Cardiovascular:     Rate and Rhythm: Tachycardia present. Rhythm irregular.  Pulmonary:     Effort: Pulmonary effort is normal.     Breath sounds: Normal breath sounds. No wheezing, rhonchi or rales.     Comments: Unlabored Musculoskeletal:     Comments: In WC, requires assistance with transfers   Skin:    General: Skin is warm and dry.  Neurological:     General: No focal deficit present.     Mental Status: She is alert and oriented to person, place, and time. Mental status is at baseline.     Motor: Weakness present.     Comments: Tremor   Psychiatric:        Mood and Affect: Mood normal.        Behavior: Behavior normal.        Thought Content: Thought content normal.        Judgment: Judgment normal.     Lab Results:  CBC    Component Value Date/Time   WBC 12.2 (H) 01/07/2021 0346   RBC 3.30 (L) 01/07/2021 0346   HGB 8.5 (L) 01/07/2021 0346   HCT 28.2 (L) 01/07/2021 0346   PLT 533 (H) 01/07/2021 0346   MCV 85.5 01/07/2021 0346   MCH 25.8 (L)  01/07/2021 0346   MCHC 30.1 01/07/2021 0346   RDW 16.3 (H) 01/07/2021 0346   LYMPHSABS 3.3 12/31/2020 1506   MONOABS 1.5 (H) 12/31/2020 1506   EOSABS 0.2 12/31/2020 1506   BASOSABS 0.1 12/31/2020 1506    BMET     Component Value Date/Time   NA 135 01/07/2021 0346   K 3.9 01/07/2021 0346   CL 98 01/07/2021 0346   CO2 28 01/07/2021 0346   GLUCOSE 109 (H) 01/07/2021 0346   BUN 26 (H) 01/07/2021 0346   CREATININE 0.68 01/07/2021 0346   CALCIUM 9.1 01/07/2021 0346   GFRNONAA >60 01/07/2021 0346    BNP    Component Value Date/Time   BNP 161.0 (H) 01/04/2021 0405    ProBNP    Component Value Date/Time   PROBNP 277.0 (H) 12/05/2020 1636    Imaging: CT Angio Chest PE W and/or Wo Contrast  Result Date: 12/31/2020 CLINICAL DATA:  Pulmonary embolus suspected. High probability. Shortness of breath. Atrial fibrillation. Hypoxia. EXAM: CT ANGIOGRAPHY CHEST WITH CONTRAST TECHNIQUE: Multidetector CT imaging of the chest was performed using the standard protocol during bolus administration of intravenous contrast. Multiplanar CT image reconstructions and MIPs were obtained to evaluate the vascular anatomy. CONTRAST:  11m OMNIPAQUE IOHEXOL 350 MG/ML SOLN COMPARISON:  One-view chest x-ray 12/31/2020.  CTA chest 11/22/2020. FINDINGS: Cardiovascular: Heart is enlarged. There are calcifications are present. Pulmonary artery opacification is excellent. No focal filling defects are present to suggest pulmonary embolus. Pulmonary artery size is normal. Atherosclerotic calcifications are present in the aortic arch. Mediastinum/Nodes: Mild bilateral hilar adenopathy is again seen from right greater than left. No significant mediastinal or axillary adenopathy is present. Thyroid gland, trachea, and esophagus demonstrate no significant findings. Lungs/Pleura: Bilateral pleural effusions are present. Ground-glass attenuation is again noted bilaterally. No airway obstruction is present. Areas of dependent atelectasis are present bilaterally. Calcification is again noted along the minor fissure. 5 mm right middle lobe calcified nodule again seen. Upper Abdomen: Cholecystectomy noted. Atherosclerotic changes are present within  the aorta and branch vessels. Upper abdomen is otherwise unremarkable. Musculoskeletal: Exaggerated thoracic kyphosis again noted. No focal osseous lesions are present. Review of the MIP images confirms the above findings. IMPRESSION: 1. No evidence for pulmonary embolus. 2. Cardiomegaly with bilateral pleural effusions and ground-glass attenuation compatible with congestive heart failure and probable bilateral pneumonia. 3. Aortic Atherosclerosis (ICD10-I70.0). Electronically Signed   By: CSan MorelleM.D.   On: 12/31/2020 16:29   DG Chest Port 1 View  Result Date: 01/03/2021 CLINICAL DATA:  Respiratory distress EXAM: PORTABLE CHEST 1 VIEW COMPARISON:  Chest x-ray 12/31/2020 FINDINGS: Cardiomegaly and mediastinum appear unchanged. Calcified plaques in the aortic arch. Persistent prominent interstitial and patchy irregular airspace opacities identified bilaterally, not significantly changed since previous study. No new consolidation appreciated. No significant pleural effusion. No pneumothorax. IMPRESSION: No significant change since previous study. Electronically Signed   By: DOfilia NeasM.D.   On: 01/03/2021 13:20   DG Chest Port 1 View  Result Date: 12/31/2020 CLINICAL DATA:  Shortness of breath EXAM: PORTABLE CHEST 1 VIEW COMPARISON:  12/27/2020 FINDINGS: The heart size and mediastinal contours are within normal limits. Mild, diffuse bilateral interstitial pulmonary opacity. The visualized skeletal structures are unremarkable. IMPRESSION: Mild, diffuse bilateral interstitial pulmonary opacity, which may reflect edema or infection. No focal airspace opacity. Electronically Signed   By: ADelanna AhmadiM.D.   On: 12/31/2020 14:37   DG Chest Portable 1 View  Result Date: 12/27/2020 CLINICAL DATA:  Hypoxia. EXAM:  PORTABLE CHEST 1 VIEW COMPARISON:  December 05, 2020. FINDINGS: Stable cardiomediastinal silhouette. Stable bilateral lung opacities are noted most consistent with chronic  interstitial lung disease. No definite acute abnormality is noted. Bony thorax is unremarkable. IMPRESSION: Stable chronic findings as described above. No definite acute abnormality is noted. Electronically Signed   By: Marijo Conception M.D.   On: 12/27/2020 18:40   ECHOCARDIOGRAM LIMITED  Result Date: 01/03/2021    ECHOCARDIOGRAM LIMITED REPORT   Patient Name:   Monique Robles Date of Exam: 01/03/2021 Medical Rec #:  [id number]        Height:       61.0 in Accession #:    [id number]       Weight:       192.7 lb Date of Birth:  1941-01-28        BSA:          1.859 m Patient Age:    59 years         BP:           113/60 mmHg Patient Gender: F                HR:           105 bpm. Exam Location:  [facility] Procedure: Limited Echo Indications:    Dyspnea  History:        Patient has prior history of Echocardiogram examinations, most                 recent 09/13/2020. Stroke, Signs/Symptoms:Dyspnea; Risk                 Factors:Hypertension and Dyslipidemia. Sepsis.  Sonographer:    Wenda Low Referring Phys: [id number] Gr[place]  1. Left ventricular ejection fraction, by estimation, is 55 to 60%. The left ventricle has normal function. Left ventricular diastolic parameters are indeterminate.  2. Right ventricular systolic function is normal. The right ventricular size is normal.  3. IVC is small suggesting low RA pressure and hypovolemia. FINDINGS  Left Ventricle: Left ventricular ejection fraction, by estimation, is 55 to 60%. The left ventricle has normal function. Left ventricular diastolic parameters are indeterminate. Right Ventricle: The right ventricular size is normal. No increase in right ventricular wall thickness. Right ventricular systolic function is normal. Venous: IVC is small suggesting low RA pressure and hypovolemia. LEFT VENTRICLE PLAX 2D LVIDd:         4.90 cm LVIDs:         3.30 cm LV PW:         0.70 cm LV IVS:        1.10 cm LVOT diam:     1.80 cm LVOT Area:     2.54  cm  LEFT ATRIUM         Index LA diam:    5.10 cm 2.74 cm/m   AORTA Ao Root diam: 2.90 cm Ao Asc diam:  3.20 cm  SHUNTS Systemic Diam: 1.80 cm Carlyle Dolly MD Electronically signed by Carlyle Dolly MD Signature Date/Time: 01/03/2021/4:29:18 PM    Final      Assessment & Plan:   A-fib [facility]) - Patient was noted to be back in Afib with RVR.  Maintained on Eliquis 53m BID and Amiodarone 2041mBID.  She has not missed any doses. Discussed with Dr. JoCarlyle Dollyith cardiology who recommend ED for cardioversion. They are working on getting her an outpatient apt with EP.  Drug-induced pneumonitis - Nitrofurantoin  pulmonary toxicity with pneumonitis and pulmonary fibrosis. No acute respiratory complaints; Breathing is unlabored today in office - Currently on prednisone taper (93m x 1 week; 381mx 1 week; 2021m 1 week; 89m26m1 week) - Needs follow-up CXR and ESR today (goal <20)  Chronic respiratory failure with hypoxia (HCC) - Continue supplemental oxygen 2 at rest and 4L with exertion to maintain O2 between 90-95%  CHF (congestive heart failure) (HCC) - Continue lasix 40mg58mly   60 mins spent on case ; >50% face to face with patient  ElizaMartyn Ehrich11/11/2020

## 2021-01-22 ENCOUNTER — Ambulatory Visit: Payer: Medicare Other | Admitting: Cardiology

## 2021-01-28 ENCOUNTER — Other Ambulatory Visit: Payer: Self-pay

## 2021-01-28 ENCOUNTER — Ambulatory Visit (INDEPENDENT_AMBULATORY_CARE_PROVIDER_SITE_OTHER): Payer: Medicare Other | Admitting: Nurse Practitioner

## 2021-01-28 ENCOUNTER — Encounter: Payer: Self-pay | Admitting: Nurse Practitioner

## 2021-01-28 VITALS — BP 126/84 | HR 78 | Ht 61.0 in | Wt 179.0 lb

## 2021-01-28 DIAGNOSIS — I480 Paroxysmal atrial fibrillation: Secondary | ICD-10-CM | POA: Diagnosis not present

## 2021-01-28 DIAGNOSIS — I959 Hypotension, unspecified: Secondary | ICD-10-CM | POA: Diagnosis not present

## 2021-01-28 DIAGNOSIS — Z87440 Personal history of urinary (tract) infections: Secondary | ICD-10-CM | POA: Diagnosis not present

## 2021-01-28 DIAGNOSIS — I5032 Chronic diastolic (congestive) heart failure: Secondary | ICD-10-CM | POA: Diagnosis not present

## 2021-01-28 NOTE — Progress Notes (Signed)
Office Visit    Patient Name: Annalie Wenner Date of Encounter: 01/28/2021  Primary Care Provider:  Celene Squibb, MD Primary Cardiologist:  Dorris Carnes, MD  Chief Complaint    80 year old female with a history of atrial fibrillation, aortic insufficiency, stroke (2018), HFpEF, PE (approximately 2001), chronic respiratory failure, hypotension requiring midodrine, hyperlipidemia, GERD/dysphagia s/p esophageal dilation, tremor, and mitral regurgitation, who presents for follow-up related to PAF/Flutter.  Past Medical History    Past Medical History:  Diagnosis Date   (HFpEF) heart failure with preserved ejection fraction ([place])    a. 09/2020 Echo: EF 65-70%, no rwma, GrI DD, nl RV fxn, mildly dil LA. Mild MR. Mild AS; b. 12/2020 Echo: EF 55-60%. Nl RV fxn.   Aortic insufficiency    a. 09/2020 Echo: No significant AI.   Chest pain    a. 10/2020 MV: EF 68%, no ischemia/scar.   Chronic respiratory failure (HCC)    DOE (dyspnea on exertion)    GERD (gastroesophageal reflux disease)    History of pneumonia    History of pulmonary embolism 2001   History of recurrent UTIs    History of stroke 2018   HTN (hypertension)    Hyperlipemia    Hypotension    a. On midodrine.   Mitral valve regurgitation    a. 09/2020 Echo: Mild MR.   Osteoporosis    PAF (paroxysmal atrial fibrillation) ([place])    a. CHA2DS2VASc = 7-->Eliquis; b. 12/2020 Amio started-->DCCV x 1; c. 01/2021 ED eval for AF-->DCCV x 1.   Tremor    Vitamin D deficiency    Past Surgical History:  Procedure Laterality Date   BALLOON DILATION N/A 11/28/2020   Procedure: BALLOON DILATION;  Surgeon: Rogene Houston, MD;  Location: [facility];  Service: Endoscopy;  Laterality: N/A;   BLADDER SURGERY     x 3   CARDIOVERSION N/A 01/04/2021   Procedure: CARDIOVERSION;  Surgeon: Arnoldo Lenis, MD;  Location: [facility];  Service: Endoscopy;  Laterality: N/A;   CERVICAL SPINE SURGERY     CHOLECYSTECTOMY     COLONOSCOPY      COLONOSCOPY WITH ESOPHAGOGASTRODUODENOSCOPY (EGD)     ESOPHAGOGASTRODUODENOSCOPY (EGD) WITH PROPOFOL N/A 11/28/2020   Procedure: ESOPHAGOGASTRODUODENOSCOPY (EGD) WITH PROPOFOL;  Surgeon: Rogene Houston, MD;  Location: [facility];  Service: Endoscopy;  Laterality: N/A;   LUMBAR SPINE SURGERY     REPLACEMENT TOTAL KNEE Left    RIGHT LUNG WEDGE REMOVAL     TONSILLECTOMY      Allergies  Allergies  Allergen Reactions   Coumadin [Warfarin] Hives   Atorvastatin Other (See Comments)    Muscle aches   Ciprofloxacin    Macrobid [Nitrofurantoin]     Cause respiratory flare   Sulfa Antibiotics Itching and Rash    History of Present Illness    80 year old female with the above past medical history including paroxysmal atrial fibrillation/flutter, aortic insufficiency, stroke (2018), pulmonary embolism (approximately 2001), chronic respiratory failure, hypotension requiring midodrine, hyperlipidemia, GERD/dysphagia status post esophageal dilatation, tremor, and mitral regurgitation.  Patient previously lived in [place] and was followed by a cardiologist and there.  She moved to [place] in 2020 or 2021, and establish care with Dr. Harrington Challenger in June 2022.  Patient has a history of chronic dyspnea on exertion and also occasional chest heaviness.  Zio monitoring in June 2022 showed predominantly sinus rhythm with 10 short runs of SVT but otherwise no significant arrhythmias.  An echocardiogram in July 2022  showed an EF of 65 to 70% with grade 1 diastolic dysfunction, and mild MR/aortic stenosis.  Stress testing was undertaken in August 2022 and showed no evidence of infarct or ischemia, with an EF of 68%.  Unfortunately, Ms. Henault has had multiple ER visits and hospitalizations over the past several months.  In September 2022, she was admitted for hypoxia.  Hospital course was complicated by atrial fibrillation which was managed with rate control and anticoagulation with Eliquis.  She was discharged to a  [place] facility.  On October 28 outpatient follow-up of an eating, she was found to be in A. fib with RVR was taken to the emergency department, cardioverted, and subsequently discharged.  She was admitted to [facility] a few days later secondary to worsening dyspnea and recurrent A. fib with RVR.  She was also found to have a UTI.  Rates remained poorly controlled despite IV diltiazem and IV amiodarone, and she underwent cardioversion October 28.  She was subsequently discharged on oral amiodarone.  On November 9, she was seen by pulmonology as an outpatient, was noted to be very weak, and was also found to be in A. fib with RVR.  She presented to the [facility] ED where she was seen by our EP team.  She was felt to be a poor long-term candidate for amiodarone therapy given lung disease.  She was also felt to be a difficult ablation candidate.  She was cardioverted in the emergency department and subsequently discharged with plan for outpatient EP follow-up.  She has since stayed at her skilled nursing facility and overall, has done well.  She wears oxygen around-the-clock has been working with physical therapy.  She does experience some dyspnea on exertion but notes that this has been stable.  She has not had any chest pain or palpitations and is in sinus rhythm today.  She denies PND, orthopnea, dizziness, syncope, edema, or early satiety.  Though the patient has not had any significant dysuria, patient's daughter notes that her urine has been malodorous and dark the past day or 2.  She is concerned about her history of recurrent UTIs and A. fib.  Home Medications    Current Outpatient Medications  Medication Sig Dispense Refill   amiodarone (PACERONE) 200 MG tablet Take 1 tablet (200 mg total) by mouth 2 (two) times daily.     apixaban (ELIQUIS) 5 MG TABS tablet Take 5 mg by mouth 2 (two) times daily.     calcium carbonate (TUMS - DOSED IN MG ELEMENTAL CALCIUM) 500 MG chewable tablet Chew 1 tablet by  mouth daily as needed for indigestion or heartburn.     cholecalciferol (VITAMIN D3) 25 MCG (1000 UNIT) tablet Take 1,000 Units by mouth daily.     cyclobenzaprine (FLEXERIL) 5 MG tablet Take 5 mg by mouth daily as needed.     diphenhydrAMINE (BENADRYL) 25 MG tablet Take 25 mg by mouth daily as needed for allergies.     docusate sodium (COLACE) 100 MG capsule Take 1 capsule (100 mg total) by mouth 2 (two) times daily. 10 capsule 0   ferrous sulfate 325 (65 FE) MG EC tablet Take 1 tablet (325 mg total) by mouth 2 (two) times daily. 60 tablet 3   furosemide (LASIX) 20 MG tablet Take 2 tablets (40 mg total) by mouth daily. 30 tablet    gabapentin (NEURONTIN) 100 MG capsule Take 200 mg by mouth 3 (three) times daily.     HYDROcodone-acetaminophen (NORCO/VICODIN) 5-325 MG tablet Take  1 tablet by mouth 2 (two) times daily as needed for moderate pain. 15 tablet 0   Lactobacillus TABS Take 1 tablet by mouth with breakfast, with lunch, and with evening meal.     levalbuterol (XOPENEX) 1.25 MG/0.5ML nebulizer solution Take 1.25 mg by nebulization every 6 (six) hours as needed for wheezing or shortness of breath. 1 each 12   Melatonin 10 MG TABS Take 10 mg by mouth at bedtime.     midodrine (PROAMATINE) 5 MG tablet Take 5 mg by mouth 3 (three) times daily with meals.     Multiple Vitamin (MULTIVITAMIN) tablet Take 1 tablet by mouth daily.     omeprazole (PRILOSEC) 40 MG capsule Take 1 capsule by mouth daily.     predniSONE (DELTASONE) 20 MG tablet Take 40mg  po daily and decrease by 10mg  every 7 days until complete     senna-docusate (SENOKOT-S) 8.6-50 MG tablet Take 2 tablets by mouth at bedtime as needed for moderate constipation.     sucralfate (CARAFATE) 1 g tablet Take 1 g by mouth at bedtime.     Vibegron (GEMTESA) 75 MG TABS Take 1 capsule by mouth daily. 30 tablet 11   No current facility-administered medications for this visit.     Review of Systems    Chronic dyspnea on exertion, which is  stable.  Strength is improving while working with physical therapy.  She denies chest pain, palpitations, PND, orthopnea, dizziness, syncope, edema, or early satiety.  Malodorous urine and dark urine over the past few days.  All other systems reviewed and are otherwise negative except as noted above.    Physical Exam    VS:  BP 126/84   Pulse 78   Ht 5\' 1"  (1.549 m)   Wt 179 lb (81.2 kg)   SpO2 99%   BMI 33.82 kg/m  , BMI Body mass index is 33.82 kg/m.     GEN: Well nourished, well developed, in no acute distress. HEENT: normal. Neck: Supple, no JVD, carotid bruits, or masses. Cardiac: RRR, no murmurs, rubs, or gallops. No clubbing, cyanosis, edema.  Radials/PT 2+ and equal bilaterally.  Respiratory:  Respirations regular and unlabored, clear to auscultation bilaterally. GI: Obese, soft, nontender, nondistended, BS + x 4. MS: no deformity or atrophy. Skin: warm and dry, no rash. Neuro:  Strength and sensation are intact. Psych: Normal affect.  Accessory Clinical Findings    ECG personally reviewed by me today -regular sinus rhythm, 72- no acute changes.  Lab Results  Component Value Date   WBC 18.5 (H) 01/16/2021   HGB 11.1 (L) 01/16/2021   HCT 38.0 01/16/2021   MCV 90.0 01/16/2021   PLT 628 (H) 01/16/2021   Lab Results  Component Value Date   CREATININE 0.84 01/16/2021   BUN 19 01/16/2021   NA 136 01/16/2021   K 4.3 01/16/2021   CL 98 01/16/2021   CO2 26 01/16/2021   Lab Results  Component Value Date   ALT 10 01/01/2021   AST 22 01/01/2021   ALKPHOS 74 01/01/2021   BILITOT 0.6 01/01/2021   Lab Results  Component Value Date   CHOL 229 (H) 08/31/2020   HDL 64 08/31/2020   LDLCALC 147 (H) 08/31/2020   TRIG 92 08/31/2020   CHOLHDL 3.6 08/31/2020     Assessment & Plan    1.  Paroxysmal atrial fibrillation: Initially diagnosed in September and complicated by recurrent respiratory failure and UTIs.  She required cardioversion on October 28 and more recently,  was  found to be in A. fib at pulmonology clinic visit November 9, prompting ER evaluation, and repeat cardioversion after being seen by our EP team in [place].  She has been maintaining sinus rhythm on amiodarone 10 mg twice daily since her November 9 cardioversion.  She previously did not tolerate AV nodal blocking agents such as metoprolol or diltiazem secondary to hypotension requiring midodrine therapy, which is ongoing.  She has been anticoagulated with Eliquis 5 mg twice daily and labs were stable on November 9.  She has follow-up with Dr. Lovena Le tomorrow to discuss antiarrhythmic management further, as amiodarone was not felt to be an ideal antiarrhythmic in the setting of chronic hypoxic respiratory failure.  2.  Chronic HFpEF: Euvolemic on examination today with stable renal function and electrolytes November 9.  Heart rate and blood pressure well controlled.  She remains on Lasix 40 mg daily.  3.  Recurrent UTIs: This is been felt to be a driver of recurrent A. fib in the recent past.  Patient's daughter is concerned that patient's urine has been malodorous and dark for the past day or so.  I have written an order for urinalysis to be carried out at the skilled nursing facility today.  4.  Hypotension: Stable on midodrine therapy.  Blood pressure 126/84 today.  Wonder if this could be weaned off-recommend weaning trial at nursing home.  5.  Chronic hypoxic respiratory failure: On home O2.  Working with physical therapy and generally tolerating this though notes chronic dyspnea on exertion.  Followed by pulmonology.  6.  Disposition: Follow-up with electrophysiology as planned tomorrow.    Murray Hodgkins, NP 01/28/2021, 5:20 PM

## 2021-01-28 NOTE — Patient Instructions (Signed)
Medication Instructions:  Your physician recommends that you continue on your current medications as directed. Please refer to the Current Medication list given to you today.   Labwork: None today   Testing/Procedures: None today   Follow-Up: Follow up with Dr.Taylor tomorrow.   Any Other Special Instructions Will Be Listed Below (If Applicable).  If you need a refill on your cardiac medications before your next appointment, please call your pharmacy.

## 2021-01-29 ENCOUNTER — Encounter: Payer: Self-pay | Admitting: Internal Medicine

## 2021-01-29 ENCOUNTER — Ambulatory Visit (INDEPENDENT_AMBULATORY_CARE_PROVIDER_SITE_OTHER): Payer: Medicare Other | Admitting: Internal Medicine

## 2021-01-29 VITALS — BP 136/76 | HR 77 | Ht 61.0 in | Wt 179.0 lb

## 2021-01-29 DIAGNOSIS — I5032 Chronic diastolic (congestive) heart failure: Secondary | ICD-10-CM | POA: Diagnosis not present

## 2021-01-29 DIAGNOSIS — I480 Paroxysmal atrial fibrillation: Secondary | ICD-10-CM

## 2021-01-29 MED ORDER — MIDODRINE HCL 5 MG PO TABS: 5.0000 mg | ORAL_TABLET | Freq: Two times a day (BID) | ORAL | 11 refills | Status: AC

## 2021-01-29 NOTE — Patient Instructions (Signed)
Medication Instructions:   On 02/07/21 Decrease Amiodarone to 200 mg Daily  Decrease Midodrine to two times daily   *If you need a refill on your cardiac medications before your next appointment, please call your pharmacy*   Lab Work: NONE   If you have labs (blood work) drawn today and your tests are completely normal, you will receive your results only by: [place] (if you have MyChart) OR A paper copy in the mail If you have any lab test that is abnormal or we need to change your treatment, we will call you to review the results.   Testing/Procedures: NONE    Follow-Up: At [facility], you and your health needs are our priority.  As part of our continuing mission to provide you with exceptional heart care, we have created designated Provider Care Teams.  These Care Teams include your primary Cardiologist (physician) and Advanced Practice Providers (APPs -  Physician Assistants and Nurse Practitioners) who all work together to provide you with the care you need, when you need it.  We recommend signing up for the patient portal called "MyChart".  Sign up information is provided on this After Visit Summary.  MyChart is used to connect with patients for Virtual Visits (Telemedicine).  Patients are able to view lab/test results, encounter notes, upcoming appointments, etc.  Non-urgent messages can be sent to your provider as well.   To learn more about what you can do with MyChart, go to [email or web address].    Your next appointment:    June 2023   The format for your next appointment:   In Person  Provider:   Cristopher Peru, MD    Other Instructions Thank you for choosing [facility]!

## 2021-01-29 NOTE — Progress Notes (Signed)
HPI Monique Robles is referred today by Dr. Harl Bowie. She is a pleasant 80 yo woman with uncontrolled atrial fib and an RVR. She has had multiple hospitalizations. She has had pneumonia and sepsis recently. She is improved and is undergoing cardiac rehab. The patient denies chest pain.  Allergies  Allergen Reactions   Coumadin [Warfarin] Hives   Atorvastatin Other (See Comments)    Muscle aches   Ciprofloxacin    Macrobid [Nitrofurantoin]     Cause respiratory flare   Sulfa Antibiotics Itching and Rash     Current Outpatient Medications  Medication Sig Dispense Refill   amiodarone (PACERONE) 200 MG tablet Take 1 tablet (200 mg total) by mouth 2 (two) times daily.     apixaban (ELIQUIS) 5 MG TABS tablet Take 5 mg by mouth 2 (two) times daily.     calcium carbonate (TUMS - DOSED IN MG ELEMENTAL CALCIUM) 500 MG chewable tablet Chew 1 tablet by mouth daily as needed for indigestion or heartburn.     cholecalciferol (VITAMIN D3) 25 MCG (1000 UNIT) tablet Take 1,000 Units by mouth daily.     cyclobenzaprine (FLEXERIL) 5 MG tablet Take 5 mg by mouth daily as needed.     diphenhydrAMINE (BENADRYL) 25 MG tablet Take 25 mg by mouth daily as needed for allergies.     docusate sodium (COLACE) 100 MG capsule Take 1 capsule (100 mg total) by mouth 2 (two) times daily. 10 capsule 0   ferrous sulfate 325 (65 FE) MG EC tablet Take 1 tablet (325 mg total) by mouth 2 (two) times daily. 60 tablet 3   furosemide (LASIX) 20 MG tablet Take 2 tablets (40 mg total) by mouth daily. 30 tablet    gabapentin (NEURONTIN) 100 MG capsule Take 200 mg by mouth 3 (three) times daily.     HYDROcodone-acetaminophen (NORCO/VICODIN) 5-325 MG tablet Take 1 tablet by mouth 2 (two) times daily as needed for moderate pain. 15 tablet 0   Lactobacillus TABS Take 1 tablet by mouth with breakfast, with lunch, and with evening meal.     levalbuterol (XOPENEX) 1.25 MG/0.5ML nebulizer solution Take 1.25 mg by nebulization every 6  (six) hours as needed for wheezing or shortness of breath. 1 each 12   Melatonin 10 MG TABS Take 10 mg by mouth at bedtime.     midodrine (PROAMATINE) 5 MG tablet Take 5 mg by mouth 3 (three) times daily with meals.     Multiple Vitamin (MULTIVITAMIN) tablet Take 1 tablet by mouth daily.     omeprazole (PRILOSEC) 40 MG capsule Take 1 capsule by mouth daily.     predniSONE (DELTASONE) 20 MG tablet Take 40mg  po daily and decrease by 10mg  every 7 days until complete     senna-docusate (SENOKOT-S) 8.6-50 MG tablet Take 2 tablets by mouth at bedtime as needed for moderate constipation.     sucralfate (CARAFATE) 1 g tablet Take 1 g by mouth at bedtime.     Vibegron (GEMTESA) 75 MG TABS Take 1 capsule by mouth daily. 30 tablet 11   No current facility-administered medications for this visit.     Past Medical History:  Diagnosis Date   (HFpEF) heart failure with preserved ejection fraction ([place])    a. 09/2020 Echo: EF 65-70%, no rwma, GrI DD, nl RV fxn, mildly dil LA. Mild MR. Mild AS; b. 12/2020 Echo: EF 55-60%. Nl RV fxn.   Aortic insufficiency    a. 09/2020 Echo: No significant AI.  Chest pain    a. 10/2020 MV: EF 68%, no ischemia/scar.   Chronic respiratory failure (HCC)    DOE (dyspnea on exertion)    GERD (gastroesophageal reflux disease)    History of pneumonia    History of pulmonary embolism 2001   History of recurrent UTIs    History of stroke 2018   HTN (hypertension)    Hyperlipemia    Hypotension    a. On midodrine.   Mitral valve regurgitation    a. 09/2020 Echo: Mild MR.   Osteoporosis    PAF (paroxysmal atrial fibrillation) ([place])    a. CHA2DS2VASc = 7-->Eliquis; b. 12/2020 Amio started-->DCCV x 1; c. 01/2021 ED eval for AF-->DCCV x 1.   Tremor    Vitamin D deficiency     ROS:   All systems reviewed and negative except as noted in the HPI.   Past Surgical History:  Procedure Laterality Date   BALLOON DILATION N/A 11/28/2020   Procedure: BALLOON DILATION;  Surgeon:  Rogene Houston, MD;  [facility];  Service: Endoscopy;  Laterality: N/A;   BLADDER SURGERY     x 3   CARDIOVERSION N/A 01/04/2021   Procedure: CARDIOVERSION;  Surgeon: Arnoldo Lenis, MD;  Location: [facility];  Service: Endoscopy;  Laterality: N/A;   CERVICAL SPINE SURGERY     CHOLECYSTECTOMY     COLONOSCOPY     COLONOSCOPY WITH ESOPHAGOGASTRODUODENOSCOPY (EGD)     ESOPHAGOGASTRODUODENOSCOPY (EGD) WITH PROPOFOL N/A 11/28/2020   Procedure: ESOPHAGOGASTRODUODENOSCOPY (EGD) WITH PROPOFOL;  Surgeon: Rogene Houston, MD;  Location: [facility];  Service: Endoscopy;  Laterality: N/A;   LUMBAR SPINE SURGERY     REPLACEMENT TOTAL KNEE Left    RIGHT LUNG WEDGE REMOVAL     TONSILLECTOMY       Family History  Problem Relation Age of Onset   Alzheimer's disease Mother        died at 9   Cancer Mother    Tremor Mother    Pneumonia Father    Heart disease Father    Diabetes Father    Heart attack Father        died at 63     Social History   Socioeconomic History   Marital status: Widowed    Spouse name: Not on file   Number of children: 3   Years of education: 12   Highest education level: High school graduate  Occupational History   Occupation: Retired  Tobacco Use   Smoking status: Never   Smokeless tobacco: Never  Vaping Use   Vaping Use: Never used  Substance and Sexual Activity   Alcohol use: Not Currently   Drug use: Never   Sexual activity: Not on file  Other Topics Concern   Not on file  Social History Narrative   Lives with her daughter.   Right-handed.   Rare caffeine use.   Social Determinants of Health   Financial Resource Strain: Not on file  Food Insecurity: Not on file  Transportation Needs: Not on file  Physical Activity: Not on file  Stress: Not on file  Social Connections: Not on file  Intimate Partner Violence: Not on file     Ht 5\' 1"  (1.549 m)   Wt 179 lb (81.2 kg)   BMI 33.82 kg/m   Physical Exam:  Well appearing  NAD HEENT: Unremarkable Neck:  No JVD, no thyromegally Lymphatics:  No adenopathy Back:  No CVA tenderness Lungs:  Clear with scattered rales but  no wheezes. HEART:  Regular rate rhythm, no murmurs, no rubs, no clicks Abd:  soft, positive bowel sounds, no organomegally, no rebound, no guarding Ext:  2 plus pulses, no edema, no cyanosis, no clubbing Skin:  No rashes no nodules Neuro:  CN II through XII intact, motor grossly intact  EKG - reviewed. NSR  Assess/Plan:  Atrial fib - she is not a great candidate for long term amiodarone but in light of her mulitple medical issues and severe symptoms with uncontrolled atrial fib, I would favor continuing the amiodarone with plans to start weaning off of amiodarone in the next 6 months. She will take 400 mg daily for another week and then reduce to 200 mg daily. I'll see her back in 6 weeks.  Interstitial lung disease - appears stable at least by exam. She will follow with Dr. Melvyn Novas. Hypotension - she is improved and I will reduce the midodrine to bid.  Monique Overlie Latishia Suitt,MD

## 2021-02-06 NOTE — Progress Notes (Signed)
Showed msg to GT.   Looks like patient did not show up last week  Has appt in Jan with him   Can that be moved up?

## 2021-02-21 ENCOUNTER — Encounter: Payer: Self-pay | Admitting: Internal Medicine

## 2021-02-21 ENCOUNTER — Ambulatory Visit (INDEPENDENT_AMBULATORY_CARE_PROVIDER_SITE_OTHER): Payer: Medicare Other | Admitting: Internal Medicine

## 2021-02-21 ENCOUNTER — Other Ambulatory Visit: Payer: Self-pay

## 2021-02-21 DIAGNOSIS — J189 Pneumonia, unspecified organism: Secondary | ICD-10-CM | POA: Diagnosis not present

## 2021-02-21 DIAGNOSIS — T50905A Adverse effect of unspecified drugs, medicaments and biological substances, initial encounter: Secondary | ICD-10-CM | POA: Diagnosis not present

## 2021-02-21 DIAGNOSIS — J9611 Chronic respiratory failure with hypoxia: Secondary | ICD-10-CM

## 2021-02-21 NOTE — Progress Notes (Signed)
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 Transiently may drop with Physical therapy, ok to increase it.  Avoid further use of Nitrofurantoin Eliquis 5mg  po bid Encourage Use of Incentive spirometry and flutter valve Prednisone taper ordered over 2 weeks      Discharge Condition: Stable CODE STATUS: Full Code  Diet recommendation: Heart healthy   Brief/Interim Summary: 80 year old with history of essential tremor, prior CVA, GERD, overactive bladder came to the hospital for evaluation of hypoxia and progressive shortness of breath.  Patient was diagnosed with likely bronchitis with intermittent fluid overload requiring Lasix.  There is a possibility patient may have drug-related toxicity from nitrofurantoin.  Seen by pulmonary.Hospital stay also complicated by A. fib with RVR started on Cardizem drip. Now she is on Propranolol.  Due to dysphagia she had barium swallow which showed esophageal motility disorder therefore GI plans for endoscopy 11/29/2020.  EGD showed benign stenosis requiring dilation.  Eliquis was resumed on 11/29/2020.  PT recommended SNF therefore arrangements were made.  Rest the recommendations as stated above.     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  Due to poor lung compliance, expect transiently become hypoxic while working with physical therapy but should recover.  Okay to increase supplemental oxygen if necessary in the meantime. -CT of the chest was reviewed, chronic interstitial lung disease identified, negative for pulmonary embolism, possible multifocal pneumonia - Pulmonary recommends prednisone taper over 2 weeks and follow-up with their service in 10-14 days.  Continue bronchodilators as necessary to be used post discharge.  A. fib with RVR -history of A. fib - Continue propranolol and Eliquis.  Cardizem PO   Dysphagia - Barium swallow showed esophageal motility disorder.  Endoscopy performed 9/21 showed benign esophageal stenosis and possible short segment of Barrett's esophagus.  Biopsies were performed along with dilation.   Essential tremor -continue propanolol -Stable  History of CVA -Stable, continue Eliquis,  -Not on any statins    GERD Continue Protonix Stable   Overactive bladder Continue trospium and vibegron Stable    Insomnia Continue melatonin.  Would avoid Benadryl or any other medications on beers list   Obesity (BMI 34.96) Patient will be counseled on diet and lifestyle modification when more stable   Chronic anemia -iron deficiency anemia - Will require outpatient colonoscopy once she is recovered from her acute illness      History of Present Illness  12/05/2020  Pulmonary/ 1st office eval/Monique Robles ? Macrodantin lung -was on it from Jan 2022 to 11/21/20  Chief Complaint  Patient presents with   Hospitalization Follow-up  Dyspnea:  baseline half way up steps, now SNF and 02 now 2lpm at rest 4-5  lpm with activity  Cough: minimal Sleep: 78 degrees electric bed  SABA use: multiple forms complicated by palpitations ? [facility] Stop the routine breathing treatments  =  albtuerol neb and inhalers Change the  levoalbuterol 1.25 mg vial up to every 4 hours as needed for wheeze/ coughing short of  breath Adjust 02 to keep sats  > 90%      02/21/2021  f/u ov/Monique Robles office/Monique Robles re: ? Macrodantin lung / 02 dep maint on 2lpm/ 4lpm with activity  and off prednisone end of Nov 2022 Chief Complaint  Patient presents with   Follow-up    SOB has improved since last OV in 11/22 with Monique Robles.   2LO2 cont. All the time.   Dyspnea:  walking to PT on 4 pronged walker Cough: none  Sleeping: bed is flat/ 2 pillows SABA use: none  02: 2lpm at rest and 4lpm with ex  Covid status: vax x 2 never infected      No obvious day to day or daytime variability or assoc excess/ purulent sputum or mucus plugs or hemoptysis or cp or chest tightness, subjective wheeze or overt sinus or hb symptoms.   Sleeping  without nocturnal  or early am exacerbation  of respiratory  c/o's or need for noct saba. Also denies any obvious fluctuation of symptoms with weather or environmental changes or other aggravating or alleviating factors except as outlined above   No unusual exposure hx or h/o childhood pna/ asthma or knowledge of premature birth.  Current Allergies, Complete Past Medical History, Past Surgical History, Family History, and Social History were reviewed in [employer] record.  ROS  The following are not active complaints unless bolded Hoarseness, sore throat, dysphagia, dental problems, itching, sneezing,  nasal congestion or discharge of excess mucus or purulent secretions, ear ache,   fever, chills, sweats, unintended wt loss or wt gain, classically pleuritic or exertional cp,  orthopnea pnd or arm/hand swelling  or leg swelling, presyncope, palpitations, abdominal pain, anorexia,  nausea, vomiting, diarrhea  or change in bowel habits or change in bladder habits, change in stools or change in urine, dysuria, hematuria,  rash, arthralgias, visual complaints, headache, numbness, weakness or ataxia or problems with walking or coordination,  change in mood or  memory.        Current Meds  Medication Sig   amiodarone (PACERONE) 200 MG tablet Take 1 tablet (200 mg total) by mouth 2 (two) times daily. (Patient taking differently: Take 200 mg by mouth daily.)   apixaban (ELIQUIS) 5 MG TABS tablet Take 5 mg by mouth 2 (two) times daily.   calcium carbonate (TUMS - DOSED IN MG ELEMENTAL CALCIUM) 500 MG chewable tablet Chew 1 tablet by mouth daily as needed for indigestion or heartburn.   cholecalciferol (VITAMIN D3) 25 MCG (1000 UNIT) tablet Take 1,000 Units by mouth daily.   cyclobenzaprine (FLEXERIL) 5 MG tablet Take 5 mg by mouth daily as needed.   diphenhydrAMINE (BENADRYL) 25 MG tablet Take 25 mg by mouth daily as needed for allergies.   docusate sodium (COLACE) 100 MG capsule Take 1 capsule (100 mg total) by mouth 2 (two) times daily.   ferrous sulfate 325 (65 FE) MG EC tablet Take  1 tablet (325 mg total) by mouth 2 (two) times daily.   furosemide (LASIX) 20 MG tablet Take 2 tablets (40 mg total) by mouth daily.   gabapentin (NEURONTIN) 100 MG capsule Take 200 mg by mouth 3 (three) times daily.   HYDROcodone-acetaminophen (NORCO/VICODIN) 5-325 MG tablet Take 1 tablet by mouth 2 (two) times daily as needed for moderate pain.   Lactobacillus TABS Take 1 tablet by mouth with breakfast, with lunch, and with evening meal.   levalbuterol (XOPENEX) 1.25 MG/0.5ML nebulizer solution Take 1.25 mg by nebulization every 6 (six) hours as needed for wheezing or shortness of breath.   Melatonin 10 MG TABS Take 10 mg by mouth at bedtime.   midodrine (PROAMATINE) 5 MG tablet Take 1 tablet (5 mg total) by mouth 2 (two) times daily with a meal.   Multiple Vitamin (MULTIVITAMIN) tablet Take 1  tablet by mouth daily.   omeprazole (PRILOSEC) 40 MG capsule Take 1 capsule by mouth daily.   predniSONE (DELTASONE) 20 MG tablet Take 40mg  po daily and decrease by 10mg  every 7 days until complete   senna-docusate (SENOKOT-S) 8.6-50 MG tablet Take 2 tablets by mouth at bedtime as needed for moderate constipation.   sucralfate (CARAFATE) 1 g tablet Take 1 g by mouth at bedtime.   Vibegron (GEMTESA) 75 MG TABS Take 1 capsule by mouth daily.               Past Medical History:  Diagnosis Date   Aortic insufficiency    Atrial fibrillation (HCC)    Diastolic dysfunction    DOE (dyspnea on exertion)    GERD (gastroesophageal reflux disease)    History of pneumonia    History of pulmonary embolism    History of stroke    HTN (hypertension)    Hyperlipemia    Mitral valve regurgitation    Osteoporosis    Tremor    Vitamin D deficiency       Objective:    Wt Readings from Last 3 Encounters:  02/21/21 178 lb 0.6 oz (80.8 kg)  01/29/21 179 lb (81.2 kg)  01/28/21 179 lb (81.2 kg)      Vital signs reviewed  02/21/2021  - Note at rest 02 sats  97% on 2lpm  cont   General appearance:    elderly wf w/c bound/ resting tremor    HEENT : pt wearing mask not removed for exam due to covid -19 concerns.    NECK :  without JVD/Nodes/TM/ nl carotid upstrokes bilaterally   LUNGS: no acc muscle use,  Nl contour chest  with trace crackles bases  bilaterally without cough on insp or exp maneuvers   CV:  RRR  no s3 or murmur or increase in P2, and trace  R > L pitting edema ankles  ABD:  soft and nontender with nl inspiratory excursion in the supine position. No bruits or organomegaly appreciated, bowel sounds nl  MS:  Nl gait/ ext warm without deformities, calf tenderness, cyanosis or clubbing No obvious joint restrictions   SKIN: warm and dry without lesions    NEURO:  alert, approp, nl sensorium with  no motor or cerebellar deficits apparent.         CT chest 02/23/17 >>  post operative changes Rt lung, 5 mm nodule RUL Echo 09/14/20 >> EF 65 to 70%, grade 1 DD, mild LA dilation, mild MR, mild AS CT angio chest 11/22/20 >> prominent LN, diffuse patchy b/l GGO air space disease, mild esophageal dilation  ANA/ ANCA/RA 9/16 22 neg           Lab Results  Component Value Date   ESRSEDRATE 19 12/05/2020   ESRSEDRATE 35 (H) 11/25/2020   ESRSEDRATE 45 (H) 11/23/2020            Assessment

## 2021-02-21 NOTE — Assessment & Plan Note (Signed)
2lpm at rest 4 lpm with activity as of 02/21/2021   Advised Make sure you check your oxygen saturation  AT  your highest level of activity (not after you stop)   to be sure it stays over 90% and adjust  02 flow upward to maintain this level if needed but remember to turn it back to previous settings when you stop (to conserve your supply).           Each maintenance medication was reviewed in detail including emphasizing most importantly the difference between maintenance and prns and under what circumstances the prns are to be triggered using an action plan format where appropriate.  Total time for H and P, chart review, counseling, reviewing 02 device(s) and generating customized AVS unique to this office visit / same day charting = 21 min

## 2021-02-21 NOTE — Patient Instructions (Addendum)
No change in medications  Use incentive spirometry as much as possible   Be sure to adjust 02 up if needed to maintain sats > 90% at all times   Please schedule a follow up office visit in 6 weeks, call sooner if needed   >>> defer cc of dysuria to sniff with caveat :  NO MACRODANTIN

## 2021-02-21 NOTE — Assessment & Plan Note (Addendum)
?   Macrodantin lung -was on it from Jan 2022 to 11/21/20  Lab Results  Component Value Date   ESRSEDRATE 60 (H) 01/04/2021   ESRSEDRATE 19 12/05/2020   ESRSEDRATE 35 (H) 11/25/2020  off prednisone 02/07/21 s flare as of ov 02/21/2021  But not on amiodarone  Rec: Minimize amio dosing Avoid macrodantin No more prednisone Follow clinically with sats on exertion  F/u 6 weeks with cxr

## 2021-02-25 DIAGNOSIS — R309 Painful micturition, unspecified: Secondary | ICD-10-CM | POA: Diagnosis not present

## 2021-02-27 DIAGNOSIS — J9611 Chronic respiratory failure with hypoxia: Secondary | ICD-10-CM | POA: Diagnosis not present

## 2021-02-27 DIAGNOSIS — R2689 Other abnormalities of gait and mobility: Secondary | ICD-10-CM | POA: Diagnosis not present

## 2021-02-27 DIAGNOSIS — M6281 Muscle weakness (generalized): Secondary | ICD-10-CM | POA: Diagnosis not present

## 2021-02-27 DIAGNOSIS — I4819 Other persistent atrial fibrillation: Secondary | ICD-10-CM | POA: Diagnosis not present

## 2021-02-27 DIAGNOSIS — A4189 Other specified sepsis: Secondary | ICD-10-CM | POA: Diagnosis not present

## 2021-02-27 DIAGNOSIS — K219 Gastro-esophageal reflux disease without esophagitis: Secondary | ICD-10-CM | POA: Diagnosis not present

## 2021-02-27 DIAGNOSIS — I5021 Acute systolic (congestive) heart failure: Secondary | ICD-10-CM | POA: Diagnosis not present

## 2021-02-28 DIAGNOSIS — R2689 Other abnormalities of gait and mobility: Secondary | ICD-10-CM | POA: Diagnosis not present

## 2021-02-28 DIAGNOSIS — M6281 Muscle weakness (generalized): Secondary | ICD-10-CM | POA: Diagnosis not present

## 2021-02-28 DIAGNOSIS — J9611 Chronic respiratory failure with hypoxia: Secondary | ICD-10-CM | POA: Diagnosis not present

## 2021-03-01 DIAGNOSIS — M6281 Muscle weakness (generalized): Secondary | ICD-10-CM | POA: Diagnosis not present

## 2021-03-01 DIAGNOSIS — R2689 Other abnormalities of gait and mobility: Secondary | ICD-10-CM | POA: Diagnosis not present

## 2021-03-01 DIAGNOSIS — J9611 Chronic respiratory failure with hypoxia: Secondary | ICD-10-CM | POA: Diagnosis not present

## 2021-03-02 DIAGNOSIS — R2689 Other abnormalities of gait and mobility: Secondary | ICD-10-CM | POA: Diagnosis not present

## 2021-03-02 DIAGNOSIS — J9611 Chronic respiratory failure with hypoxia: Secondary | ICD-10-CM | POA: Diagnosis not present

## 2021-03-02 DIAGNOSIS — M6281 Muscle weakness (generalized): Secondary | ICD-10-CM | POA: Diagnosis not present

## 2021-03-05 DIAGNOSIS — J9611 Chronic respiratory failure with hypoxia: Secondary | ICD-10-CM | POA: Diagnosis not present

## 2021-03-05 DIAGNOSIS — M6281 Muscle weakness (generalized): Secondary | ICD-10-CM | POA: Diagnosis not present

## 2021-03-05 DIAGNOSIS — R2689 Other abnormalities of gait and mobility: Secondary | ICD-10-CM | POA: Diagnosis not present

## 2021-03-06 DIAGNOSIS — J9611 Chronic respiratory failure with hypoxia: Secondary | ICD-10-CM | POA: Diagnosis not present

## 2021-03-06 DIAGNOSIS — M6281 Muscle weakness (generalized): Secondary | ICD-10-CM | POA: Diagnosis not present

## 2021-03-06 DIAGNOSIS — R2689 Other abnormalities of gait and mobility: Secondary | ICD-10-CM | POA: Diagnosis not present

## 2021-03-07 DIAGNOSIS — M6281 Muscle weakness (generalized): Secondary | ICD-10-CM | POA: Diagnosis not present

## 2021-03-07 DIAGNOSIS — J9611 Chronic respiratory failure with hypoxia: Secondary | ICD-10-CM | POA: Diagnosis not present

## 2021-03-07 DIAGNOSIS — R2689 Other abnormalities of gait and mobility: Secondary | ICD-10-CM | POA: Diagnosis not present

## 2021-03-08 DIAGNOSIS — M6281 Muscle weakness (generalized): Secondary | ICD-10-CM | POA: Diagnosis not present

## 2021-03-08 DIAGNOSIS — J9611 Chronic respiratory failure with hypoxia: Secondary | ICD-10-CM | POA: Diagnosis not present

## 2021-03-08 DIAGNOSIS — R2689 Other abnormalities of gait and mobility: Secondary | ICD-10-CM | POA: Diagnosis not present

## 2021-03-09 DIAGNOSIS — J9611 Chronic respiratory failure with hypoxia: Secondary | ICD-10-CM | POA: Diagnosis not present

## 2021-03-09 DIAGNOSIS — M6281 Muscle weakness (generalized): Secondary | ICD-10-CM | POA: Diagnosis not present

## 2021-03-09 DIAGNOSIS — R2689 Other abnormalities of gait and mobility: Secondary | ICD-10-CM | POA: Diagnosis not present

## 2021-03-11 DIAGNOSIS — R2689 Other abnormalities of gait and mobility: Secondary | ICD-10-CM | POA: Diagnosis not present

## 2021-03-11 DIAGNOSIS — M6281 Muscle weakness (generalized): Secondary | ICD-10-CM | POA: Diagnosis not present

## 2021-03-11 DIAGNOSIS — J9611 Chronic respiratory failure with hypoxia: Secondary | ICD-10-CM | POA: Diagnosis not present

## 2021-03-12 DIAGNOSIS — J9611 Chronic respiratory failure with hypoxia: Secondary | ICD-10-CM | POA: Diagnosis not present

## 2021-03-12 DIAGNOSIS — M6281 Muscle weakness (generalized): Secondary | ICD-10-CM | POA: Diagnosis not present

## 2021-03-12 DIAGNOSIS — R2689 Other abnormalities of gait and mobility: Secondary | ICD-10-CM | POA: Diagnosis not present

## 2021-03-13 DIAGNOSIS — R2689 Other abnormalities of gait and mobility: Secondary | ICD-10-CM | POA: Diagnosis not present

## 2021-03-13 DIAGNOSIS — J9611 Chronic respiratory failure with hypoxia: Secondary | ICD-10-CM | POA: Diagnosis not present

## 2021-03-13 DIAGNOSIS — M6281 Muscle weakness (generalized): Secondary | ICD-10-CM | POA: Diagnosis not present

## 2021-03-14 ENCOUNTER — Ambulatory Visit: Payer: Medicare Other | Admitting: Internal Medicine

## 2021-03-14 DIAGNOSIS — M6281 Muscle weakness (generalized): Secondary | ICD-10-CM | POA: Diagnosis not present

## 2021-03-14 DIAGNOSIS — J9611 Chronic respiratory failure with hypoxia: Secondary | ICD-10-CM | POA: Diagnosis not present

## 2021-03-14 DIAGNOSIS — R2689 Other abnormalities of gait and mobility: Secondary | ICD-10-CM | POA: Diagnosis not present

## 2021-03-15 ENCOUNTER — Institutional Professional Consult (permissible substitution): Payer: Medicare Other | Admitting: Neurology

## 2021-03-15 DIAGNOSIS — M6281 Muscle weakness (generalized): Secondary | ICD-10-CM | POA: Diagnosis not present

## 2021-03-15 DIAGNOSIS — J9611 Chronic respiratory failure with hypoxia: Secondary | ICD-10-CM | POA: Diagnosis not present

## 2021-03-15 DIAGNOSIS — R2689 Other abnormalities of gait and mobility: Secondary | ICD-10-CM | POA: Diagnosis not present

## 2021-03-16 DIAGNOSIS — R2689 Other abnormalities of gait and mobility: Secondary | ICD-10-CM | POA: Diagnosis not present

## 2021-03-16 DIAGNOSIS — M6281 Muscle weakness (generalized): Secondary | ICD-10-CM | POA: Diagnosis not present

## 2021-03-16 DIAGNOSIS — J9611 Chronic respiratory failure with hypoxia: Secondary | ICD-10-CM | POA: Diagnosis not present

## 2021-03-18 ENCOUNTER — Other Ambulatory Visit: Payer: Self-pay

## 2021-03-18 ENCOUNTER — Ambulatory Visit (INDEPENDENT_AMBULATORY_CARE_PROVIDER_SITE_OTHER): Payer: Medicare Other | Admitting: Physician Assistant

## 2021-03-18 VITALS — BP 159/98 | HR 93 | Ht 61.0 in | Wt 176.0 lb

## 2021-03-18 DIAGNOSIS — N3021 Other chronic cystitis with hematuria: Secondary | ICD-10-CM

## 2021-03-18 DIAGNOSIS — J9611 Chronic respiratory failure with hypoxia: Secondary | ICD-10-CM | POA: Diagnosis not present

## 2021-03-18 DIAGNOSIS — N3281 Overactive bladder: Secondary | ICD-10-CM | POA: Diagnosis not present

## 2021-03-18 DIAGNOSIS — N3000 Acute cystitis without hematuria: Secondary | ICD-10-CM

## 2021-03-18 DIAGNOSIS — M6281 Muscle weakness (generalized): Secondary | ICD-10-CM | POA: Diagnosis not present

## 2021-03-18 DIAGNOSIS — R2689 Other abnormalities of gait and mobility: Secondary | ICD-10-CM | POA: Diagnosis not present

## 2021-03-18 LAB — URINALYSIS, ROUTINE W REFLEX MICROSCOPIC
Bilirubin, UA: NEGATIVE
Glucose, UA: NEGATIVE
Ketones, UA: NEGATIVE
Nitrite, UA: NEGATIVE
Protein,UA: NEGATIVE
RBC, UA: NEGATIVE
Specific Gravity, UA: 1.01 (ref 1.005–1.030)
Urobilinogen, Ur: 0.2 mg/dL (ref 0.2–1.0)
pH, UA: 6.5 (ref 5.0–7.5)

## 2021-03-18 LAB — MICROSCOPIC EXAMINATION
Epithelial Cells (non renal): >10 /hpf — AB (ref 0–10)
RBC, Urine: NONE SEEN /hpf (ref 0–2)
Renal Epithel, UA: NONE SEEN /hpf

## 2021-03-18 LAB — BLADDER SCAN AMB NON-IMAGING: Scan Result: 21

## 2021-03-18 MED ORDER — CEPHALEXIN 500 MG PO CAPS: 500.0000 mg | ORAL_CAPSULE | Freq: Two times a day (BID) | ORAL | Status: DC

## 2021-03-18 NOTE — Progress Notes (Signed)
03/18/2021 9:44 AM   Monique Robles 08-23-1940 [id number]  Referring provider: Celene Squibb, MD [address] [place],  [place] [address]  CC: Abdominal pain and dysuria   HPI: 08/24/20 Monique Monique Robles is a 81yo here for evaluation of urinary incontinence and recurrent UTI. She was previously followup by a urologist in [place], [place] for chronic UTI and had CT and cystoscopy which per patient was normal. She gets 6-7 UTIs per year. She has urge incontinence and uses 5-6 pads per day which are soaked. She uses 2 pads at night.  She was taking oxybutyin for several years and had to stop it due to starting eliquis. She then tried Detrol which failed to improve her incontinence. She notes worse incontinence on gabapentin. She has a hx of CVA in 2018. PVR 0cc.   09/28/20 Monique Robles is a 81yo here for followup for recurrent UTI and OAB. Last urine culture fro 08/2020 was ecoli sensitive to macrobid. She finished a 7 day course and her LUTS improved. No dysuria currently. She continues to have urinary frequency and urgency. She has urge incontinence daily. She tried mirabegron which failed to improve her LUTS.   03/18/21  Pt presents today c/o urinary urgency, burning, and frequency with lower abdominal pain. Her OAB sxs improved on Gemtesa until UTI sxs started. Pt was on HS Macrodantin which was associated with pt admission for respiratory failure in September 22 and pt was again hospitalized in Oct and Nov 22. Urinalysis and cx results reviewed from ED and inpatient care. Pt now in rehab at [facility] and [facility]. UTIs at that facility tx with Keflex per pt's daughter. Cx: 12/10/20 Mixed Flora 01/31/21 E Coli R to Cipro, Levoquin, Bactrim 12/19 E.Coli R to Cipro, Levoquin, Bactrim  UA=11-30 WBCs, moderate bacteria-culture ordered PVR=21   PMH: Past Medical History:  Diagnosis Date   (HFpEF) heart failure with preserved ejection fraction ([place])    a. 09/2020 Echo: EF 65-70%,  no rwma, GrI DD, nl RV fxn, mildly dil LA. Mild MR. Mild AS; b. 12/2020 Echo: EF 55-60%. Nl RV fxn.   Aortic insufficiency    a. 09/2020 Echo: No significant AI.   Chest pain    a. 10/2020 MV: EF 68%, no ischemia/scar.   Chronic respiratory failure (HCC)    DOE (dyspnea on exertion)    GERD (gastroesophageal reflux disease)    History of pneumonia    History of pulmonary embolism 2001   History of recurrent UTIs    History of stroke 2018   HTN (hypertension)    Hyperlipemia    Hypotension    a. On midodrine.   Mitral valve regurgitation    a. 09/2020 Echo: Mild MR.   Osteoporosis    PAF (paroxysmal atrial fibrillation) ([place])    a. CHA2DS2VASc = 7-->Eliquis; b. 12/2020 Amio started-->DCCV x 1; c. 01/2021 ED eval for AF-->DCCV x 1.   Tremor    Vitamin D deficiency     Surgical History: Past Surgical History:  Procedure Laterality Date   BALLOON DILATION N/A 11/28/2020   Procedure: BALLOON DILATION;  Surgeon: Rogene Houston, MD;  Location: [facility];  Service: Endoscopy;  Laterality: N/A;   BLADDER SURGERY     x 3   CARDIOVERSION N/A 01/04/2021   Procedure: CARDIOVERSION;  Surgeon: Arnoldo Lenis, MD;  Location: [facility] ORS;  Service: Endoscopy;  Laterality: N/A;   CERVICAL SPINE SURGERY     CHOLECYSTECTOMY  COLONOSCOPY     COLONOSCOPY WITH ESOPHAGOGASTRODUODENOSCOPY (EGD)     ESOPHAGOGASTRODUODENOSCOPY (EGD) WITH PROPOFOL N/A 11/28/2020   Procedure: ESOPHAGOGASTRODUODENOSCOPY (EGD) WITH PROPOFOL;  Surgeon: Rogene Houston, MD;  Location: [facility];  Service: Endoscopy;  Laterality: N/A;   LUMBAR SPINE SURGERY     REPLACEMENT TOTAL KNEE Left    RIGHT LUNG WEDGE REMOVAL     TONSILLECTOMY      Home Medications:  Allergies as of 03/18/2021       Reactions   Coumadin [warfarin] Hives   Atorvastatin Other (See Comments)   Muscle aches   Ciprofloxacin    Macrobid [nitrofurantoin]    Cause respiratory flare   Sulfa Antibiotics Itching, Rash        Medication  List        Accurate as of March 18, 2021  9:44 AM. If you have any questions, ask your nurse or doctor.          acetaminophen 650 MG CR tablet Commonly known as: TYLENOL Take 650 mg by mouth every 8 (eight) hours as needed for pain.   amiodarone 200 MG tablet Commonly known as: PACERONE Take 1 tablet (200 mg total) by mouth 2 (two) times daily. What changed: when to take this   apixaban 5 MG Tabs tablet Commonly known as: ELIQUIS Take 5 mg by mouth 2 (two) times daily.   calcium carbonate 500 MG chewable tablet Commonly known as: TUMS - dosed in mg elemental calcium Chew 1 tablet by mouth daily as needed for indigestion or heartburn.   cholecalciferol 25 MCG (1000 UNIT) tablet Commonly known as: VITAMIN D3 Take 1,000 Units by mouth daily.   cyclobenzaprine 5 MG tablet Commonly known as: FLEXERIL Take 5 mg by mouth daily as needed.   diphenhydrAMINE 25 MG tablet Commonly known as: BENADRYL Take 25 mg by mouth daily as needed for allergies.   docusate sodium 100 MG capsule Commonly known as: COLACE Take 1 capsule (100 mg total) by mouth 2 (two) times daily.   ferrous sulfate 325 (65 FE) MG EC tablet Take 1 tablet (325 mg total) by mouth 2 (two) times daily.   furosemide 20 MG tablet Commonly known as: LASIX Take 2 tablets (40 mg total) by mouth daily.   gabapentin 100 MG capsule Commonly known as: NEURONTIN Take 200 mg by mouth 3 (three) times daily.   Gemtesa 75 MG Tabs Generic drug: Vibegron Take 1 capsule by mouth daily.   HYDROcodone-acetaminophen 5-325 MG tablet Commonly known as: NORCO/VICODIN Take 1 tablet by mouth 2 (two) times daily as needed for moderate pain.   Lactobacillus Tabs Take 1 tablet by mouth with breakfast, with lunch, and with evening meal.   levalbuterol 1.25 MG/0.5ML nebulizer solution Commonly known as: XOPENEX Take 1.25 mg by nebulization every 6 (six) hours as needed for wheezing or shortness of breath.   Melatonin 10  MG Tabs Take 10 mg by mouth at bedtime.   midodrine 5 MG tablet Commonly known as: PROAMATINE Take 1 tablet (5 mg total) by mouth 2 (two) times daily with a meal.   multivitamin tablet Take 1 tablet by mouth daily.   omeprazole 40 MG capsule Commonly known as: PRILOSEC Take 1 capsule by mouth daily.   senna-docusate 8.6-50 MG tablet Commonly known as: Senokot-S Take 2 tablets by mouth at bedtime as needed for moderate constipation.   sucralfate 1 g tablet Commonly known as: CARAFATE Take 1 g by mouth at bedtime.  Allergies:  Allergies  Allergen Reactions   Coumadin [Warfarin] Hives   Atorvastatin Other (See Comments)    Muscle aches   Ciprofloxacin    Macrobid [Nitrofurantoin]     Cause respiratory flare   Sulfa Antibiotics Itching and Rash    Family History: Family History  Problem Relation Age of Onset   Alzheimer's disease Mother        died at 41   Cancer Mother    Tremor Mother    Pneumonia Father    Heart disease Father    Diabetes Father    Heart attack Father        died at 29    Social History:  reports that she has never smoked. She has never used smokeless tobacco. She reports that she does not currently use alcohol. She reports that she does not use drugs.  ROS: All other review of systems were reviewed and are negative except what is noted above in HPI  Physical Exam: BP (!) 159/98    Pulse 93    Ht 5\' 1"  (1.549 m)    Wt 176 lb (79.8 kg)    BMI 33.25 kg/m   Constitutional:  Alert and oriented, No acute distress. HEENT: [place] AT, trachea midline Cardiovascular: No clubbing, cyanosis. Respiratory: Normal respiratory effort, pt on O2 via nasal canula GI: Abdomen is soft, mild suprapubic tenderness, nondistended, no abdominal masses GU: No CVA tenderness.  Psychiatric: Normal mood and affect.  Laboratory Data: Lab Results  Component Value Date   WBC 18.5 (H) 01/16/2021   HGB 11.1 (L) 01/16/2021   HCT 38.0 01/16/2021   MCV 90.0  01/16/2021   PLT 628 (H) 01/16/2021    Lab Results  Component Value Date   CREATININE 0.84 01/16/2021   Urinalysis    Component Value Date/Time   COLORURINE YELLOW 01/16/2021 1538   APPEARANCEUR HAZY (A) 01/16/2021 1538   APPEARANCEUR Clear 09/28/2020 1121   LABSPEC 1.005 01/16/2021 1538   PHURINE 7.0 01/16/2021 1538   GLUCOSEU NEGATIVE 01/16/2021 1538   HGBUR NEGATIVE 01/16/2021 1538   BILIRUBINUR NEGATIVE 01/16/2021 1538   BILIRUBINUR Negative 09/28/2020 1121   KETONESUR NEGATIVE 01/16/2021 1538   PROTEINUR NEGATIVE 01/16/2021 1538   NITRITE NEGATIVE 01/16/2021 1538   LEUKOCYTESUR MODERATE (A) 01/16/2021 1538    Lab Results  Component Value Date   LABMICR See below: 09/28/2020   WBCUA 6-10 (A) 09/28/2020   LABEPIT 0-10 09/28/2020   BACTERIA FEW (A) 01/16/2021    Pertinent Imaging: No imaging at this visit  Assessment & Plan:    1. Overactive bladder Continue Gemtesa HS FU in one month for recheck - BLADDER SCAN AMB NON-IMAGING  2. Chronic cystitis with hematuria Tx acute sxs and UTI with Keflex x 7 days and culture pending.  Resume HS antibx pending today's culture results. - Urinalysis, Routine w reflex microscopic   No follow-ups on file.  [place], Berneice Heinrich, PA-C  [facility] [place]

## 2021-03-18 NOTE — Progress Notes (Signed)
post void residual= 21  Urological Symptom Review  Patient is experiencing the following symptoms: Frequent urination Hard to postpone urination Burning/pain with urination Get up at night to urinate Urinary tract infection   Review of Systems  Gastrointestinal (upper)  : Negative for upper GI symptoms  Gastrointestinal (lower) : Negative for lower GI symptoms  Constitutional : Fatigue  Skin: Negative for skin symptoms  Eyes: Blurred vision  Ear/Nose/Throat : Negative for Ear/Nose/Throat symptoms  Hematologic/Lymphatic: Negative for Hematologic/Lymphatic symptoms  Cardiovascular : Leg swelling  Respiratory : Shortness of breath  Endocrine: Negative for endocrine symptoms  Musculoskeletal: Negative for musculoskeletal symptoms  Neurological: Headaches Dizziness  Psychologic: Negative for psychiatric symptoms

## 2021-03-19 DIAGNOSIS — J9611 Chronic respiratory failure with hypoxia: Secondary | ICD-10-CM | POA: Diagnosis not present

## 2021-03-19 DIAGNOSIS — R2689 Other abnormalities of gait and mobility: Secondary | ICD-10-CM | POA: Diagnosis not present

## 2021-03-19 DIAGNOSIS — M6281 Muscle weakness (generalized): Secondary | ICD-10-CM | POA: Diagnosis not present

## 2021-03-20 DIAGNOSIS — M6281 Muscle weakness (generalized): Secondary | ICD-10-CM | POA: Diagnosis not present

## 2021-03-20 DIAGNOSIS — J9611 Chronic respiratory failure with hypoxia: Secondary | ICD-10-CM | POA: Diagnosis not present

## 2021-03-20 DIAGNOSIS — R2689 Other abnormalities of gait and mobility: Secondary | ICD-10-CM | POA: Diagnosis not present

## 2021-03-21 ENCOUNTER — Telehealth: Payer: Self-pay

## 2021-03-21 ENCOUNTER — Other Ambulatory Visit: Payer: Self-pay

## 2021-03-21 ENCOUNTER — Other Ambulatory Visit: Payer: Self-pay | Admitting: Physician Assistant

## 2021-03-21 DIAGNOSIS — R2689 Other abnormalities of gait and mobility: Secondary | ICD-10-CM | POA: Diagnosis not present

## 2021-03-21 DIAGNOSIS — J9611 Chronic respiratory failure with hypoxia: Secondary | ICD-10-CM | POA: Diagnosis not present

## 2021-03-21 DIAGNOSIS — N3021 Other chronic cystitis with hematuria: Secondary | ICD-10-CM

## 2021-03-21 DIAGNOSIS — M6281 Muscle weakness (generalized): Secondary | ICD-10-CM | POA: Diagnosis not present

## 2021-03-21 LAB — URINE CULTURE

## 2021-03-21 MED ORDER — AMBULATORY NON FORMULARY MEDICATION: 0 refills | Status: DC

## 2021-03-21 MED ORDER — AMOXICILLIN 500 MG PO TABS: 500.0000 mg | ORAL_TABLET | Freq: Two times a day (BID) | ORAL | 0 refills | Status: DC

## 2021-03-21 NOTE — Progress Notes (Signed)
Urine culture indicates vancomycin resistant Enterococcus faecalis.  50K - 100,000 colonies.  It is sensitive to penicillin and Macrobid which she cannot take.  Amoxicillin for 7 days prescribed.  Consider Keflex 250 at bedtime if she resolves symptoms.

## 2021-03-21 NOTE — Telephone Encounter (Signed)
Spoke with patient's nurse Lovena Le at [facility] and made her aware of new orders. Orders also faxed  to facility at [phone]

## 2021-03-21 NOTE — Addendum Note (Signed)
Addended byIris Pert on: 03/21/2021 10:27 AM   Modules accepted: Orders

## 2021-03-21 NOTE — Telephone Encounter (Signed)
Spoke with patient's daughter and made her aware of new orders.

## 2021-03-21 NOTE — Telephone Encounter (Signed)
-----   Message from Reynaldo Minium, [place] sent at 03/21/2021  9:37 AM EST ----- Pt culture indicates Amoxicillin is best for treatment. She needs to DC the Keflex and start new Rx that I will send to pharmacy. Will assess her sxs next week in order to resume nighttime dosing of preventative antibiotics.  She was on nightly Macrodantin in the past which she can no longer take. ----- Message ----- From: Interface, [facility] Lab Results In Sent: 03/18/2021   4:26 PM EST To: Reynaldo Minium, PA-C

## 2021-03-22 DIAGNOSIS — J9611 Chronic respiratory failure with hypoxia: Secondary | ICD-10-CM | POA: Diagnosis not present

## 2021-03-22 DIAGNOSIS — R2689 Other abnormalities of gait and mobility: Secondary | ICD-10-CM | POA: Diagnosis not present

## 2021-03-22 DIAGNOSIS — M6281 Muscle weakness (generalized): Secondary | ICD-10-CM | POA: Diagnosis not present

## 2021-03-25 DIAGNOSIS — M6281 Muscle weakness (generalized): Secondary | ICD-10-CM | POA: Diagnosis not present

## 2021-03-25 DIAGNOSIS — R2689 Other abnormalities of gait and mobility: Secondary | ICD-10-CM | POA: Diagnosis not present

## 2021-03-25 DIAGNOSIS — J9611 Chronic respiratory failure with hypoxia: Secondary | ICD-10-CM | POA: Diagnosis not present

## 2021-03-26 ENCOUNTER — Ambulatory Visit (INDEPENDENT_AMBULATORY_CARE_PROVIDER_SITE_OTHER): Payer: Medicare Other | Admitting: Internal Medicine

## 2021-03-26 ENCOUNTER — Encounter: Payer: Self-pay | Admitting: Internal Medicine

## 2021-03-26 ENCOUNTER — Other Ambulatory Visit: Payer: Self-pay

## 2021-03-26 VITALS — BP 138/80 | HR 90 | Ht 61.0 in | Wt 178.2 lb

## 2021-03-26 DIAGNOSIS — M6281 Muscle weakness (generalized): Secondary | ICD-10-CM | POA: Diagnosis not present

## 2021-03-26 DIAGNOSIS — R2689 Other abnormalities of gait and mobility: Secondary | ICD-10-CM | POA: Diagnosis not present

## 2021-03-26 DIAGNOSIS — J9611 Chronic respiratory failure with hypoxia: Secondary | ICD-10-CM | POA: Diagnosis not present

## 2021-03-26 DIAGNOSIS — I480 Paroxysmal atrial fibrillation: Secondary | ICD-10-CM

## 2021-03-26 MED ORDER — MULTAQ 400 MG PO TABS: 400.0000 mg | ORAL_TABLET | Freq: Two times a day (BID) | ORAL | 11 refills | Status: DC

## 2021-03-26 NOTE — Patient Instructions (Signed)
Medication Instructions:   Stop taking Amiodarone on May 08, 2021 Start Taking Multaq 400 mg Two Times Daily with Food.   *If you need a refill on your cardiac medications before your next appointment, please call your pharmacy*   Lab Work: Your physician recommends that you return for lab work in: June ( BMP, Amiodarone, TSH )   If you have labs (blood work) drawn today and your tests are completely normal, you will receive your results only by: [place] (if you have [facility] [place]) OR A paper copy in the mail If you have any lab test that is abnormal or we need to change your treatment, we will call you to review the results.   Testing/Procedures: NONE    Follow-Up: At [facility], you and your health needs are our priority.  As part of our continuing mission to provide you with exceptional heart care, we have created designated Provider Care Teams.  These Care Teams include your primary Cardiologist (physician) and Advanced Practice Providers (APPs -  Physician Assistants and Nurse Practitioners) who all work together to provide you with the care you need, when you need it.  We recommend signing up for the patient portal called "MyChart".  Sign up information is provided on this After Visit Summary.  MyChart is used to connect with patients for Virtual Visits (Telemedicine).  Patients are able to view lab/test results, encounter notes, upcoming appointments, etc.  Non-urgent messages can be sent to your provider as well.   To learn more about what you can do with MyChart, go to [email or web address].    Your next appointment:    June   The format for your next appointment:   In Person  Provider:   Cristopher Peru, MD    Other Instructions Thank you for choosing [facility]!

## 2021-03-26 NOTE — Progress Notes (Signed)
HPI Mrs. Coin is referred today by Dr. Harl Bowie. She is a pleasant 81 yo woman with uncontrolled atrial fib and an RVR. She has had multiple hospitalizations. She has had pneumonia and sepsis recently. She is improved and is undergoing cardiac rehab. The patient denies chest pain.  Allergies  Allergen Reactions   Coumadin [Warfarin] Hives   Atorvastatin Other (See Comments)    Muscle aches   Ciprofloxacin    Macrobid [Nitrofurantoin]     Cause respiratory flare   Sulfa Antibiotics Itching and Rash     Current Outpatient Medications  Medication Sig Dispense Refill   acetaminophen (TYLENOL) 650 MG CR tablet Take 650 mg by mouth every 8 (eight) hours as needed for pain.     AMBULATORY NON FORMULARY MEDICATION Amoxicillin 500 mg po bid x 7 days  Pt culture indicates Amoxicillin is best for treatment. Start Amoxicillin 500 mg po bid x 7 days. She needs to DC the Keflex and start new Rx. Will assess her sxs next week in order to resume nighttime dosing of preventative antibiotics.  She was on nightly Macrodantin in the past which she can no longer take. 500 mg 0   amiodarone (PACERONE) 200 MG tablet Take 1 tablet (200 mg total) by mouth 2 (two) times daily. (Patient taking differently: Take 200 mg by mouth daily.)     amoxicillin (AMOXIL) 500 MG tablet Take 1 tablet (500 mg total) by mouth 2 (two) times daily. 14 tablet 0   apixaban (ELIQUIS) 5 MG TABS tablet Take 5 mg by mouth 2 (two) times daily.     calcium carbonate (TUMS - DOSED IN MG ELEMENTAL CALCIUM) 500 MG chewable tablet Chew 1 tablet by mouth daily as needed for indigestion or heartburn.     cholecalciferol (VITAMIN D3) 25 MCG (1000 UNIT) tablet Take 1,000 Units by mouth daily.     cyclobenzaprine (FLEXERIL) 5 MG tablet Take 5 mg by mouth daily as needed.     diphenhydrAMINE (BENADRYL) 25 MG tablet Take 25 mg by mouth daily as needed for allergies.     docusate sodium (COLACE) 100 MG capsule Take 1 capsule (100 mg total) by  mouth 2 (two) times daily. 10 capsule 0   ferrous sulfate 325 (65 FE) MG EC tablet Take 1 tablet (325 mg total) by mouth 2 (two) times daily. 60 tablet 3   furosemide (LASIX) 20 MG tablet Take 2 tablets (40 mg total) by mouth daily. 30 tablet    gabapentin (NEURONTIN) 100 MG capsule Take 200 mg by mouth 3 (three) times daily.     HYDROcodone-acetaminophen (NORCO/VICODIN) 5-325 MG tablet Take 1 tablet by mouth 2 (two) times daily as needed for moderate pain. 15 tablet 0   Lactobacillus TABS Take 1 tablet by mouth with breakfast, with lunch, and with evening meal.     levalbuterol (XOPENEX) 1.25 MG/0.5ML nebulizer solution Take 1.25 mg by nebulization every 6 (six) hours as needed for wheezing or shortness of breath. 1 each 12   Melatonin 10 MG TABS Take 10 mg by mouth at bedtime.     midodrine (PROAMATINE) 5 MG tablet Take 1 tablet (5 mg total) by mouth 2 (two) times daily with a meal. 60 tablet 11   Multiple Vitamin (MULTIVITAMIN) tablet Take 1 tablet by mouth daily.     omeprazole (PRILOSEC) 40 MG capsule Take 1 capsule by mouth daily.     senna-docusate (SENOKOT-S) 8.6-50 MG tablet Take 2 tablets by mouth at bedtime as  needed for moderate constipation.     sucralfate (CARAFATE) 1 g tablet Take 1 g by mouth at bedtime.     Vibegron (GEMTESA) 75 MG TABS Take 1 capsule by mouth daily. 30 tablet 11   No current facility-administered medications for this visit.     Past Medical History:  Diagnosis Date   (HFpEF) heart failure with preserved ejection fraction ([place])    a. 09/2020 Echo: EF 65-70%, no rwma, GrI DD, nl RV fxn, mildly dil LA. Mild MR. Mild AS; b. 12/2020 Echo: EF 55-60%. Nl RV fxn.   Aortic insufficiency    a. 09/2020 Echo: No significant AI.   Chest pain    a. 10/2020 MV: EF 68%, no ischemia/scar.   Chronic respiratory failure (HCC)    DOE (dyspnea on exertion)    GERD (gastroesophageal reflux disease)    History of pneumonia    History of pulmonary embolism 2001   History of  recurrent UTIs    History of stroke 2018   HTN (hypertension)    Hyperlipemia    Hypotension    a. On midodrine.   Mitral valve regurgitation    a. 09/2020 Echo: Mild MR.   Osteoporosis    PAF (paroxysmal atrial fibrillation) ([place])    a. CHA2DS2VASc = 7-->Eliquis; b. 12/2020 Amio started-->DCCV x 1; c. 01/2021 ED eval for AF-->DCCV x 1.   Tremor    Vitamin D deficiency     ROS:   All systems reviewed and negative except as noted in the HPI.   Past Surgical History:  Procedure Laterality Date   BALLOON DILATION N/A 11/28/2020   Procedure: BALLOON DILATION;  Surgeon: Rogene Houston, MD;  Location: [facility];  Service: Endoscopy;  Laterality: N/A;   BLADDER SURGERY     x 3   CARDIOVERSION N/A 01/04/2021   Procedure: CARDIOVERSION;  Surgeon: Arnoldo Lenis, MD;  Location: [facility] ORS;  Service: Endoscopy;  Laterality: N/A;   CERVICAL SPINE SURGERY     CHOLECYSTECTOMY     COLONOSCOPY     COLONOSCOPY WITH ESOPHAGOGASTRODUODENOSCOPY (EGD)     ESOPHAGOGASTRODUODENOSCOPY (EGD) WITH PROPOFOL N/A 11/28/2020   Procedure: ESOPHAGOGASTRODUODENOSCOPY (EGD) WITH PROPOFOL;  Surgeon: Rogene Houston, MD;  Location: [facility];  Service: Endoscopy;  Laterality: N/A;   LUMBAR SPINE SURGERY     REPLACEMENT TOTAL KNEE Left    RIGHT LUNG WEDGE REMOVAL     TONSILLECTOMY       Family History  Problem Relation Age of Onset   Alzheimer's disease Mother        died at 71   Cancer Mother    Tremor Mother    Pneumonia Father    Heart disease Father    Diabetes Father    Heart attack Father        died at 47     Social History   Socioeconomic History   Marital status: Widowed    Spouse name: Not on file   Number of children: 3   Years of education: 12   Highest education level: High school graduate  Occupational History   Occupation: Retired  Tobacco Use   Smoking status: Never   Smokeless tobacco: Never  Vaping Use   Vaping Use: Never used  Substance and Sexual Activity    Alcohol use: Not Currently   Drug use: Never   Sexual activity: Not on file  Other Topics Concern   Not on file  Social History Narrative   Lives with her  daughter.   Right-handed.   Rare caffeine use.   Social Determinants of Health   Financial Resource Strain: Not on file  Food Insecurity: Not on file  Transportation Needs: Not on file  Physical Activity: Not on file  Stress: Not on file  Social Connections: Not on file  Intimate Partner Violence: Not on file     BP 138/80    Pulse 90    Ht 5\' 1"  (1.549 m)    Wt 178 lb 3.2 oz (80.8 kg)    SpO2 100%    BMI 33.67 kg/m   Physical Exam:  Well appearing NAD HEENT: Unremarkable Neck:  No JVD, no thyromegally Lymphatics:  No adenopathy Back:  No CVA tenderness Lungs:  Clear HEART:  Regular rate rhythm, no murmurs, no rubs, no clicks Abd:  soft, positive bowel sounds, no organomegally, no rebound, no guarding Ext:  2 plus pulses, no edema, no cyanosis, no clubbing Skin:  No rashes no nodules Neuro:  CN II through XII intact, motor grossly intact  EKG  DEVICE  Normal device function.  See PaceArt for details.   Assess/Plan:   Atrial fib - she is not a great candidate for long term amiodarone but in light of her mulitple medical issues and severe symptoms with uncontrolled atrial fib, I would favor continuing the amiodarone with plans to start weaning off of amiodarone stopping it on 05/08/21. I will have her start Multaq 400 bid with plans for me to see her back in June with blood work including an amiodarone level.  If her levels are acceptable she will be admitted in June or July to initiate dofetilide. Interstitial lung disease - appears stable at least by exam. She will follow with Dr. Melvyn Novas. Continue oxygen. Hypotension - she is improved and I will reduce the midodrine to bid. Obesity - once things stabilized we will work on weight loss.    Carleene Overlie Taunja Brickner,MD

## 2021-03-27 DIAGNOSIS — R2689 Other abnormalities of gait and mobility: Secondary | ICD-10-CM | POA: Diagnosis not present

## 2021-03-27 DIAGNOSIS — M6281 Muscle weakness (generalized): Secondary | ICD-10-CM | POA: Diagnosis not present

## 2021-03-27 DIAGNOSIS — J9611 Chronic respiratory failure with hypoxia: Secondary | ICD-10-CM | POA: Diagnosis not present

## 2021-03-28 DIAGNOSIS — M6281 Muscle weakness (generalized): Secondary | ICD-10-CM | POA: Diagnosis not present

## 2021-03-28 DIAGNOSIS — J9611 Chronic respiratory failure with hypoxia: Secondary | ICD-10-CM | POA: Diagnosis not present

## 2021-03-28 DIAGNOSIS — R2689 Other abnormalities of gait and mobility: Secondary | ICD-10-CM | POA: Diagnosis not present

## 2021-03-29 DIAGNOSIS — J9611 Chronic respiratory failure with hypoxia: Secondary | ICD-10-CM | POA: Diagnosis not present

## 2021-03-29 DIAGNOSIS — M6281 Muscle weakness (generalized): Secondary | ICD-10-CM | POA: Diagnosis not present

## 2021-03-29 DIAGNOSIS — R2689 Other abnormalities of gait and mobility: Secondary | ICD-10-CM | POA: Diagnosis not present

## 2021-04-01 ENCOUNTER — Telehealth: Payer: Self-pay

## 2021-04-01 DIAGNOSIS — I4819 Other persistent atrial fibrillation: Secondary | ICD-10-CM | POA: Diagnosis not present

## 2021-04-01 DIAGNOSIS — J9611 Chronic respiratory failure with hypoxia: Secondary | ICD-10-CM | POA: Diagnosis not present

## 2021-04-01 DIAGNOSIS — R2689 Other abnormalities of gait and mobility: Secondary | ICD-10-CM | POA: Diagnosis not present

## 2021-04-01 DIAGNOSIS — K219 Gastro-esophageal reflux disease without esophagitis: Secondary | ICD-10-CM | POA: Diagnosis not present

## 2021-04-01 DIAGNOSIS — M6281 Muscle weakness (generalized): Secondary | ICD-10-CM | POA: Diagnosis not present

## 2021-04-01 DIAGNOSIS — I5021 Acute systolic (congestive) heart failure: Secondary | ICD-10-CM | POA: Diagnosis not present

## 2021-04-01 NOTE — Telephone Encounter (Signed)
I spoke with Velna Hatchet, the patient's nurse at [facility], the facility where the patient is getting rehab.  Patient has completed her recent antibiotic but continues to have symptoms.  Urinalysis and urine culture ordered at the facility and will address treatment pending those results.  Once the patient's UTI is cleared, will prescribe at bedtime dosing for prevention.

## 2021-04-01 NOTE — Telephone Encounter (Signed)
Brandy a nurse form [employer] called advising patient completed medication on the 19th of this month. Patient is still having discomfort and the nurse advised the discussion of a nighttime med to help with symptoms. She wanted to follow up and see what your thoughts were.

## 2021-04-02 DIAGNOSIS — M79672 Pain in left foot: Secondary | ICD-10-CM | POA: Diagnosis not present

## 2021-04-02 DIAGNOSIS — M79671 Pain in right foot: Secondary | ICD-10-CM | POA: Diagnosis not present

## 2021-04-02 DIAGNOSIS — R2689 Other abnormalities of gait and mobility: Secondary | ICD-10-CM | POA: Diagnosis not present

## 2021-04-02 DIAGNOSIS — I739 Peripheral vascular disease, unspecified: Secondary | ICD-10-CM | POA: Diagnosis not present

## 2021-04-02 DIAGNOSIS — J9611 Chronic respiratory failure with hypoxia: Secondary | ICD-10-CM | POA: Diagnosis not present

## 2021-04-02 DIAGNOSIS — L110 Acquired keratosis follicularis: Secondary | ICD-10-CM | POA: Diagnosis not present

## 2021-04-02 DIAGNOSIS — M79675 Pain in left toe(s): Secondary | ICD-10-CM | POA: Diagnosis not present

## 2021-04-02 DIAGNOSIS — M79674 Pain in right toe(s): Secondary | ICD-10-CM | POA: Diagnosis not present

## 2021-04-02 DIAGNOSIS — M6281 Muscle weakness (generalized): Secondary | ICD-10-CM | POA: Diagnosis not present

## 2021-04-03 DIAGNOSIS — R2689 Other abnormalities of gait and mobility: Secondary | ICD-10-CM | POA: Diagnosis not present

## 2021-04-03 DIAGNOSIS — M6281 Muscle weakness (generalized): Secondary | ICD-10-CM | POA: Diagnosis not present

## 2021-04-03 DIAGNOSIS — J9611 Chronic respiratory failure with hypoxia: Secondary | ICD-10-CM | POA: Diagnosis not present

## 2021-04-04 DIAGNOSIS — J9611 Chronic respiratory failure with hypoxia: Secondary | ICD-10-CM | POA: Diagnosis not present

## 2021-04-04 DIAGNOSIS — M6281 Muscle weakness (generalized): Secondary | ICD-10-CM | POA: Diagnosis not present

## 2021-04-04 DIAGNOSIS — R2689 Other abnormalities of gait and mobility: Secondary | ICD-10-CM | POA: Diagnosis not present

## 2021-04-05 ENCOUNTER — Telehealth: Payer: Self-pay

## 2021-04-05 ENCOUNTER — Other Ambulatory Visit: Payer: Self-pay

## 2021-04-05 ENCOUNTER — Other Ambulatory Visit: Payer: Self-pay | Admitting: Urology

## 2021-04-05 DIAGNOSIS — J9611 Chronic respiratory failure with hypoxia: Secondary | ICD-10-CM | POA: Diagnosis not present

## 2021-04-05 DIAGNOSIS — M6281 Muscle weakness (generalized): Secondary | ICD-10-CM | POA: Diagnosis not present

## 2021-04-05 DIAGNOSIS — R2689 Other abnormalities of gait and mobility: Secondary | ICD-10-CM | POA: Diagnosis not present

## 2021-04-05 MED ORDER — DOXYCYCLINE HYCLATE 100 MG PO CAPS: 100.0000 mg | ORAL_CAPSULE | Freq: Two times a day (BID) | ORAL | 0 refills | Status: DC

## 2021-04-05 NOTE — Telephone Encounter (Signed)
-----   Message from Reynaldo Minium, [place] sent at 04/05/2021  1:32 PM EST ----- [facility] faxed urine cx results to office.  The pt needs 10 days of Doxycycline 100 BID and FU UA in 2 weeks.

## 2021-04-05 NOTE — Telephone Encounter (Signed)
[facility] and gave verbal for doxycycline 100mg  BID for 10 days.  Rx faxed as well to [phone]  Patient appt with Sharee Pimple for ua in 2 weeks mailed. Patient will be discharged on 04/10/2021

## 2021-04-07 DIAGNOSIS — J449 Chronic obstructive pulmonary disease, unspecified: Secondary | ICD-10-CM | POA: Diagnosis not present

## 2021-04-07 DIAGNOSIS — I509 Heart failure, unspecified: Secondary | ICD-10-CM | POA: Diagnosis not present

## 2021-04-07 DIAGNOSIS — J9611 Chronic respiratory failure with hypoxia: Secondary | ICD-10-CM | POA: Diagnosis not present

## 2021-04-08 DIAGNOSIS — R2689 Other abnormalities of gait and mobility: Secondary | ICD-10-CM | POA: Diagnosis not present

## 2021-04-08 DIAGNOSIS — M6281 Muscle weakness (generalized): Secondary | ICD-10-CM | POA: Diagnosis not present

## 2021-04-08 DIAGNOSIS — J9611 Chronic respiratory failure with hypoxia: Secondary | ICD-10-CM | POA: Diagnosis not present

## 2021-04-09 DIAGNOSIS — M6281 Muscle weakness (generalized): Secondary | ICD-10-CM | POA: Diagnosis not present

## 2021-04-09 DIAGNOSIS — J9611 Chronic respiratory failure with hypoxia: Secondary | ICD-10-CM | POA: Diagnosis not present

## 2021-04-09 DIAGNOSIS — R2689 Other abnormalities of gait and mobility: Secondary | ICD-10-CM | POA: Diagnosis not present

## 2021-04-10 ENCOUNTER — Other Ambulatory Visit: Payer: Self-pay

## 2021-04-10 ENCOUNTER — Ambulatory Visit (INDEPENDENT_AMBULATORY_CARE_PROVIDER_SITE_OTHER): Payer: Medicare Other | Admitting: Internal Medicine

## 2021-04-10 ENCOUNTER — Encounter: Payer: Self-pay | Admitting: Internal Medicine

## 2021-04-10 DIAGNOSIS — T50905A Adverse effect of unspecified drugs, medicaments and biological substances, initial encounter: Secondary | ICD-10-CM | POA: Diagnosis not present

## 2021-04-10 DIAGNOSIS — J9611 Chronic respiratory failure with hypoxia: Secondary | ICD-10-CM

## 2021-04-10 DIAGNOSIS — J189 Pneumonia, unspecified organism: Secondary | ICD-10-CM

## 2021-04-10 NOTE — Assessment & Plan Note (Signed)
?   Macrodantin lung -was on it from Jan 2022 to 11/21/20  Lab Results  Component Value Date   ESRSEDRATE 60 (H) 01/04/2021   ESRSEDRATE 19 12/05/2020   ESRSEDRATE 35 (H) 11/25/2020  off prednisone 02/07/21 s flare as of ov 02/21/2021   ? How much still 02 dep but no evidence of flare off macrodantin and on amiodarone with plans to wean off outlined by last cards note.  In meatime key is mobilize as much as she can and f/u q 3 m

## 2021-04-10 NOTE — Assessment & Plan Note (Signed)
2lpm at rest 4 lpm with activity as of 02/21/2021   Advised to titrate to sats > 90% and will refer for best fit eval  - worse case scenario may need basket on rolling walker for 02  to help simplify mobilization/ advised    F/u @ 3 months      Each maintenance medication was reviewed in detail including emphasizing most importantly the difference between maintenance and prns and under what circumstances the prns are to be triggered using an action plan format where appropriate.  Total time for H and P, chart review, counseling, reviewing 02 device(s) and generating customized AVS unique to this post SNF  office visit / same day charting  > 30 min

## 2021-04-10 NOTE — Patient Instructions (Signed)
Make sure you check your oxygen saturation  AT  your highest level of activity (not after you stop)   to be sure it stays over 90% and adjust  02 flow upward to maintain this level if needed but remember to turn it back to previous settings when you stop (to conserve your supply).   We will request a best fit for portable oxygen    Please schedule a follow up visit in 3 months but call sooner if needed

## 2021-04-10 NOTE — Progress Notes (Signed)
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 Transiently may drop with Physical therapy, ok to increase it.  Avoid further use of Nitrofurantoin Eliquis 5mg  po bid Encourage Use of Incentive spirometry and flutter valve Prednisone taper ordered over 2 weeks      Discharge Condition: Stable CODE STATUS: Full Code  Diet recommendation: Heart healthy   Brief/Interim Summary: 81 year old with history of essential tremor, prior CVA, GERD, overactive bladder came to the hospital for evaluation of hypoxia and progressive shortness of breath.  Patient was diagnosed with likely bronchitis with intermittent fluid overload requiring Lasix.  There is a possibility patient may have drug-related toxicity from nitrofurantoin.  Seen by pulmonary.Hospital stay also complicated by A. fib with RVR started on Cardizem drip. Now she is on Propranolol.  Due to dysphagia she had barium swallow which showed esophageal motility disorder therefore GI plans for endoscopy 11/29/2020.  EGD showed benign stenosis requiring dilation.  Eliquis was resumed on 11/29/2020.  PT recommended SNF therefore arrangements were made.  Rest the recommendations as stated above.     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  Due to poor lung compliance, expect transiently become hypoxic while working with physical therapy but should recover.  Okay to increase supplemental oxygen if necessary in the meantime. -CT of the chest was reviewed, chronic interstitial lung disease identified, negative for pulmonary embolism, possible multifocal pneumonia - Pulmonary recommends prednisone taper over 2 weeks and follow-up with their service in 10-14 days.  Continue bronchodilators as necessary to be used post discharge.  A. fib with RVR -history of A. fib - Continue propranolol and Eliquis.  Cardizem PO   Dysphagia - Barium swallow showed esophageal motility disorder.  Endoscopy performed 9/21 showed benign esophageal stenosis and possible short segment of Barrett's esophagus.  Biopsies were performed along with dilation.   Essential tremor -continue propanolol -Stable  History of CVA -Stable, continue Eliquis,  -Not on any statins    GERD Continue Protonix Stable   Overactive bladder Continue trospium and vibegron Stable    Insomnia Continue melatonin.  Would avoid Benadryl or any other medications on beers list   Obesity (BMI 34.96) Patient will be counseled on diet and lifestyle modification when more stable   Chronic anemia -iron deficiency anemia - Will require outpatient colonoscopy once she is recovered from her acute illness      History of Present Illness  12/05/2020  Pulmonary/ 1st office eval/Avarose Mervine ? Macrodantin lung -was on it from Jan 2022 to 11/21/20  Chief Complaint  Patient presents with   Hospitalization Follow-up  Dyspnea:  baseline half way up steps, now SNF and 02 now 2lpm at rest 4-5  lpm with activity  Cough: minimal Sleep: 30 degrees electric bed  SABA use: multiple forms complicated by palpitations ? [facility] Stop the routine breathing treatments  =  albtuerol neb and inhalers Change the  levoalbuterol 1.25 mg vial up to every 4 hours as needed for wheeze/ coughing short of  breath Adjust 02 to keep sats  > 90%      02/21/2021  f/u ov/[place] office/Aahan Marques re: ? Macrodantin lung / 02 dep maint on 2lpm/ 4lpm with activity  and off prednisone end of Nov 2022 Chief Complaint  Patient presents with   Follow-up    SOB has improved since last OV in 11/22 with Volanda Napoleon.   2LO2 cont. All the time.   Dyspnea:  walking to PT on 4 pronged walker Cough: none  Sleeping: bed is flat/ 2 pillows SABA use: none  02: 2lpm at rest and 4lpm with ex  Covid status: vax x 2 never infected  Rec No change in medications Use incentive spirometry as much as possible  Be sure to adjust 02 up if needed to maintain sats > 90% at all times  Please schedule a follow up office visit in 6 weeks, call sooner if needed  >>> defer cc of dysuria to sniff with caveat :  NO MACRODANTIN   04/10/2021  f/u ov/[place] office/Fynn Adel re: ? Macrodantin lung / 02 dep  maint on amiodarone now  Chief Complaint  Patient presents with   Follow-up    Breathing has improved since last OV.  Pt d/c from SNF today. 2 LO2 cont.   Dyspnea:  walking down thall at NH with  PT with 4 pronged walker / maybe 100 ft   on 4lpm  Cough: none  Sleeping: 30 degree HOB / plans to use propped up mattress at home  SABA use: none 02: 2lpm at rest and stting and 4lpm walking Covid status:  vax x 2      No obvious day to day or daytime variability or assoc excess/ purulent sputum or mucus plugs or hemoptysis or cp or chest tightness, subjective wheeze or overt sinus or hb symptoms.   Sleeping  without nocturnal  or early am exacerbation  of respiratory  c/o's or need for noct saba. Also denies any obvious fluctuation of  symptoms with weather or environmental changes or other aggravating or alleviating factors except as outlined above   No unusual exposure hx or h/o childhood pna/ asthma or knowledge of premature birth.  Current Allergies, Complete Past Medical History, Past Surgical History, Family History, and Social History were reviewed in [employer] record.  ROS  The following are not active complaints unless bolded Hoarseness, sore throat, dysphagia, dental problems, itching, sneezing,  nasal congestion or discharge of excess mucus or purulent secretions, ear ache,   fever, chills, sweats, unintended wt loss or wt gain, classically pleuritic or exertional cp,  orthopnea pnd or arm/hand swelling  or leg swelling, presyncope, palpitations, abdominal pain, anorexia, nausea, vomiting, diarrhea  or change in bowel habits or change in bladder habits, change in stools or change in urine, dysuria, hematuria,  rash, arthralgias, visual complaints, headache, numbness, weakness or ataxia or problems with walking or coordination,  change in mood  or  memory.        Current Meds  Medication Sig   acetaminophen (TYLENOL) 650 MG CR tablet Take 650 mg by mouth every 8 (eight) hours as needed for pain.   apixaban (ELIQUIS) 5 MG TABS tablet Take 5 mg by mouth 2 (two) times daily.   calcium carbonate (TUMS - DOSED IN MG ELEMENTAL CALCIUM) 500 MG chewable tablet Chew 1 tablet by mouth daily as needed for indigestion or heartburn.   cholecalciferol (VITAMIN D3) 25 MCG (1000 UNIT) tablet Take 1,000 Units by mouth daily.   cyclobenzaprine (FLEXERIL) 5 MG tablet Take 5 mg by mouth daily as needed.   diphenhydrAMINE (BENADRYL) 25 MG tablet Take 25 mg by mouth daily as needed for allergies.   docusate sodium (COLACE) 100 MG capsule Take 1 capsule (100 mg total) by mouth 2 (two) times daily.   dronedarone (MULTAQ) 400 MG tablet Take 1 tablet (400 mg total) by mouth 2 (two) times daily with a meal.   ferrous  sulfate 325 (65 FE) MG EC tablet Take 1 tablet (325 mg total) by mouth 2 (two) times daily.   furosemide (LASIX) 20 MG tablet Take 2 tablets (40 mg total) by mouth daily.   gabapentin (NEURONTIN) 100 MG capsule Take 200 mg by mouth 3 (three) times daily.   HYDROcodone-acetaminophen (NORCO/VICODIN) 5-325 MG tablet Take 1 tablet by mouth 2 (two) times daily as needed for moderate pain.   Lactobacillus TABS Take 1 tablet by mouth with breakfast, with lunch, and with evening meal.   levalbuterol (XOPENEX) 1.25 MG/0.5ML nebulizer solution Take 1.25 mg by nebulization every 6 (six) hours as needed for wheezing or shortness of breath.   Melatonin 10 MG TABS Take 10 mg by mouth at bedtime.   midodrine (PROAMATINE) 5 MG tablet Take 1 tablet (5 mg total) by mouth 2 (two) times daily with a meal.   Multiple Vitamin (MULTIVITAMIN) tablet Take 1 tablet by mouth daily.   omeprazole (PRILOSEC) 40 MG capsule Take 1 capsule by mouth daily.   senna-docusate (SENOKOT-S) 8.6-50 MG tablet Take 2 tablets by mouth at bedtime as needed for moderate constipation.   sucralfate (CARAFATE) 1 g tablet Take 1 g by mouth at bedtime.   Vibegron (GEMTESA) 75 MG TABS Take 1 capsule by mouth daily.   [DISCONTINUED] AMBULATORY NON FORMULARY MEDICATION Amoxicillin 500 mg po bid x 7 days  Pt culture indicates Amoxicillin is best for treatment. Start Amoxicillin 500 mg po bid x 7 days. She needs to DC the Keflex and start new Rx. Will assess her sxs next week in order to resume nighttime dosing of preventative antibiotics.  She was on nightly Macrodantin in the past which she can no longer take.   [DISCONTINUED] doxycycline (VIBRAMYCIN) 100 MG capsule Take 1 capsule (100 mg total) by mouth 2 (two) times daily.                   Past Medical History:  Diagnosis Date   Aortic insufficiency    Atrial fibrillation (HCC)    Diastolic dysfunction    DOE (dyspnea on exertion)    GERD (gastroesophageal reflux disease)    History  of pneumonia    History of pulmonary embolism    History of stroke    HTN (hypertension)    Hyperlipemia    Mitral valve regurgitation    Osteoporosis    Tremor    Vitamin D deficiency       Objective:    04/10/2021  178   02/21/21 178 lb 0.6 oz (80.8 kg)  01/29/21 179 lb (81.2 kg)  01/28/21 179 lb (81.2 kg)    Vital signs reviewed  04/10/2021  - Note at rest 02 sats  99% on 2lpm    General appearance:    w/c bound elderly wf nad    HEENT : pt wearing mask not removed for exam due to covid -19 concerns.    NECK :  without JVD/Nodes/TM/ nl carotid upstrokes bilaterally   LUNGS: no acc muscle use,  Nl contour chest with insp crackles in bases  bilaterally without cough on insp or exp maneuvers   CV:  Mostly RRR  rare skipped beat no s3 or murmur or increase in P2, and no edema   ABD:  soft and nontender with nl inspiratory excursion in the supine position. No bruits or organomegaly appreciated, bowel sounds nl  MS: ext warm without deformities, calf tenderness, cyanosis or clubbing No obvious joint restrictions   SKIN: warm and dry without lesions    NEURO:  alert, approp, nl sensorium with  no motor or cerebellar deficits apparent.            Assessment

## 2021-04-11 ENCOUNTER — Telehealth: Payer: Self-pay

## 2021-04-11 NOTE — Telephone Encounter (Signed)
-----   Message from Tanda Rockers, MD sent at 04/10/2021  4:29 PM EST ----- Let daughter know that many pts use 02 when ambulating with walker and best one is one that fits the 02 in a basket in front or to the side to ease the use of whatever device she's trying to qualify for

## 2021-04-11 NOTE — Telephone Encounter (Signed)
Called and informed daughter. She voiced understanding.

## 2021-04-15 ENCOUNTER — Telehealth: Payer: Self-pay

## 2021-04-15 NOTE — Telephone Encounter (Signed)
Pt's daughter [name] calling to check on which appt her mother needs to come to. Pt has 2 appointments.  Please advise.  Call back at:  [phone]  Thanks, Helene Kelp

## 2021-04-16 NOTE — Telephone Encounter (Signed)
She needs to keep the 04/19/21 appointment.

## 2021-04-17 DIAGNOSIS — N390 Urinary tract infection, site not specified: Secondary | ICD-10-CM | POA: Diagnosis not present

## 2021-04-17 DIAGNOSIS — N3281 Overactive bladder: Secondary | ICD-10-CM | POA: Diagnosis not present

## 2021-04-17 DIAGNOSIS — R110 Nausea: Secondary | ICD-10-CM | POA: Diagnosis not present

## 2021-04-17 DIAGNOSIS — Z79899 Other long term (current) drug therapy: Secondary | ICD-10-CM | POA: Diagnosis not present

## 2021-04-17 DIAGNOSIS — E559 Vitamin D deficiency, unspecified: Secondary | ICD-10-CM | POA: Diagnosis not present

## 2021-04-17 DIAGNOSIS — L293 Anogenital pruritus, unspecified: Secondary | ICD-10-CM | POA: Diagnosis not present

## 2021-04-17 DIAGNOSIS — I4891 Unspecified atrial fibrillation: Secondary | ICD-10-CM | POA: Diagnosis not present

## 2021-04-17 DIAGNOSIS — J849 Interstitial pulmonary disease, unspecified: Secondary | ICD-10-CM | POA: Diagnosis not present

## 2021-04-17 DIAGNOSIS — G250 Essential tremor: Secondary | ICD-10-CM | POA: Diagnosis not present

## 2021-04-17 DIAGNOSIS — Z136 Encounter for screening for cardiovascular disorders: Secondary | ICD-10-CM | POA: Diagnosis not present

## 2021-04-17 DIAGNOSIS — R600 Localized edema: Secondary | ICD-10-CM | POA: Diagnosis not present

## 2021-04-17 DIAGNOSIS — K219 Gastro-esophageal reflux disease without esophagitis: Secondary | ICD-10-CM | POA: Diagnosis not present

## 2021-04-17 DIAGNOSIS — D649 Anemia, unspecified: Secondary | ICD-10-CM | POA: Diagnosis not present

## 2021-04-17 DIAGNOSIS — M545 Low back pain, unspecified: Secondary | ICD-10-CM | POA: Diagnosis not present

## 2021-04-18 ENCOUNTER — Ambulatory Visit: Payer: Medicare Other | Admitting: Gastroenterology

## 2021-04-19 ENCOUNTER — Ambulatory Visit: Payer: Medicare Other | Admitting: Physician Assistant

## 2021-04-19 ENCOUNTER — Telehealth: Payer: Self-pay | Admitting: Internal Medicine

## 2021-04-19 NOTE — Telephone Encounter (Signed)
Dtr calling to inform us that pt's cholesterol was elevated and PCP wants to start her on a statin, in which she has had issues taking before. Dtr is going to send cholesterol numbers to Dr. Lovena Le for review.  Aware we may be able to refer pt to our pharmD clinic to discuss cholesterol control options with statin intolerance. Will route to RN to follow up on next week.

## 2021-04-19 NOTE — Telephone Encounter (Signed)
Daughter is calling wanting to discuss medication changes that PCP is wanting to make.

## 2021-04-21 ENCOUNTER — Encounter: Payer: Self-pay | Admitting: Internal Medicine

## 2021-04-24 NOTE — Telephone Encounter (Signed)
See Dr. Tanna Furry response via MyChart:  Monique Lance, MD to [name]   9:58 PM Its a fair concern. I think in the short term we can hold off on her taking the cholesterol medications. Encouraged her to avoid fatty foods and sugar. GT

## 2021-04-29 ENCOUNTER — Ambulatory Visit: Payer: Medicare Other | Admitting: Physician Assistant

## 2021-05-02 ENCOUNTER — Ambulatory Visit: Payer: Medicare Other | Admitting: Physician Assistant

## 2021-05-08 DIAGNOSIS — I509 Heart failure, unspecified: Secondary | ICD-10-CM | POA: Diagnosis not present

## 2021-05-08 DIAGNOSIS — J9611 Chronic respiratory failure with hypoxia: Secondary | ICD-10-CM | POA: Diagnosis not present

## 2021-05-08 DIAGNOSIS — J449 Chronic obstructive pulmonary disease, unspecified: Secondary | ICD-10-CM | POA: Diagnosis not present

## 2021-05-09 ENCOUNTER — Ambulatory Visit: Payer: Medicare Other | Admitting: Physician Assistant

## 2021-05-09 DIAGNOSIS — N3000 Acute cystitis without hematuria: Secondary | ICD-10-CM

## 2021-05-09 DIAGNOSIS — N3021 Other chronic cystitis with hematuria: Secondary | ICD-10-CM

## 2021-05-09 DIAGNOSIS — N3281 Overactive bladder: Secondary | ICD-10-CM

## 2021-05-10 ENCOUNTER — Other Ambulatory Visit: Payer: Self-pay

## 2021-05-10 ENCOUNTER — Encounter: Payer: Self-pay | Admitting: Urology

## 2021-05-10 ENCOUNTER — Ambulatory Visit: Payer: Medicare Other | Admitting: Urology

## 2021-05-10 VITALS — BP 127/79 | HR 83 | Wt 165.0 lb

## 2021-05-10 DIAGNOSIS — N3021 Other chronic cystitis with hematuria: Secondary | ICD-10-CM

## 2021-05-10 DIAGNOSIS — N3281 Overactive bladder: Secondary | ICD-10-CM | POA: Diagnosis not present

## 2021-05-10 DIAGNOSIS — N3000 Acute cystitis without hematuria: Secondary | ICD-10-CM | POA: Diagnosis not present

## 2021-05-10 LAB — URINALYSIS, ROUTINE W REFLEX MICROSCOPIC
Bilirubin, UA: NEGATIVE
Glucose, UA: NEGATIVE
Ketones, UA: NEGATIVE
Nitrite, UA: POSITIVE — AB
Specific Gravity, UA: 1.02 (ref 1.005–1.030)
Urobilinogen, Ur: 0.2 mg/dL (ref 0.2–1.0)
pH, UA: 6 (ref 5.0–7.5)

## 2021-05-10 LAB — MICROSCOPIC EXAMINATION: Renal Epithel, UA: NONE SEEN /hpf

## 2021-05-10 LAB — BLADDER SCAN AMB NON-IMAGING: Scan Result: 1

## 2021-05-10 MED ORDER — TRIMETHOPRIM 100 MG PO TABS: 100.0000 mg | ORAL_TABLET | Freq: Every evening | ORAL | 11 refills | Status: DC

## 2021-05-10 MED ORDER — FLUCONAZOLE 150 MG PO TABS: 150.0000 mg | ORAL_TABLET | Freq: Once | ORAL | 0 refills | Status: DC

## 2021-05-10 MED ORDER — FLUCONAZOLE 150 MG PO TABS
150.0000 mg | ORAL_TABLET | Freq: Every day | ORAL | 0 refills | Status: DC
Start: 2021-05-10 — End: 2021-06-15

## 2021-05-10 MED ORDER — AMOXICILLIN-POT CLAVULANATE 875-125 MG PO TABS: 1.0000 | ORAL_TABLET | Freq: Two times a day (BID) | ORAL | 0 refills | Status: DC

## 2021-05-10 MED ORDER — GEMTESA 75 MG PO TABS: 1.0000 | ORAL_TABLET | Freq: Every day | ORAL | 11 refills | Status: AC

## 2021-05-10 NOTE — Progress Notes (Signed)
05/10/2021 10:38 AM   Monique Robles Jun 18, 1940 [id number]  Referring provider: Celene Squibb, MD [address],  [place] [id number]  Followup OAB and recurrent UTI   HPI: Monique Robles is a 81yo here for followup for recurrent UTI and OAB. She is doing well on gemtesa 75mg  daily and has only mild urgency and occasional mild urge incontinence. For the past 3-4 months she has noted an increased frequency of her UTIs. She has been diagnosed with 4 in the past 3 months. She was previously on macrobid but had to stop if for pulmonary issues. UA today is concerning for infection. No other complaitns today   PMH: Past Medical History:  Diagnosis Date   (HFpEF) heart failure with preserved ejection fraction ([place])    a. 09/2020 Echo: EF 65-70%, no rwma, GrI DD, nl RV fxn, mildly dil LA. Mild MR. Mild AS; b. 12/2020 Echo: EF 55-60%. Nl RV fxn.   Aortic insufficiency    a. 09/2020 Echo: No significant AI.   Chest pain    a. 10/2020 MV: EF 68%, no ischemia/scar.   Chronic respiratory failure (HCC)    DOE (dyspnea on exertion)    GERD (gastroesophageal reflux disease)    History of pneumonia    History of pulmonary embolism 2001   History of recurrent UTIs    History of stroke 2018   HTN (hypertension)    Hyperlipemia    Hypotension    a. On midodrine.   Mitral valve regurgitation    a. 09/2020 Echo: Mild MR.   Osteoporosis    PAF (paroxysmal atrial fibrillation) ([place])    a. CHA2DS2VASc = 7-->Eliquis; b. 12/2020 Amio started-->DCCV x 1; c. 01/2021 ED eval for AF-->DCCV x 1.   Tremor    Vitamin D deficiency     Surgical History: Past Surgical History:  Procedure Laterality Date   BALLOON DILATION N/A 11/28/2020   Procedure: BALLOON DILATION;  Surgeon: Rogene Houston, MD;  Location: [facility];  Service: Endoscopy;  Laterality: N/A;   BLADDER SURGERY     x 3   CARDIOVERSION N/A 01/04/2021   Procedure: CARDIOVERSION;  Surgeon: Arnoldo Lenis, MD;  Location: [facility];   Service: Endoscopy;  Laterality: N/A;   CERVICAL SPINE SURGERY     CHOLECYSTECTOMY     COLONOSCOPY     COLONOSCOPY WITH ESOPHAGOGASTRODUODENOSCOPY (EGD)     ESOPHAGOGASTRODUODENOSCOPY (EGD) WITH PROPOFOL N/A 11/28/2020   Procedure: ESOPHAGOGASTRODUODENOSCOPY (EGD) WITH PROPOFOL;  Surgeon: Rogene Houston, MD;  Location: [facility];  Service: Endoscopy;  Laterality: N/A;   LUMBAR SPINE SURGERY     REPLACEMENT TOTAL KNEE Left    RIGHT LUNG WEDGE REMOVAL     TONSILLECTOMY      Home Medications:  Allergies as of 05/10/2021       Reactions   Coumadin [warfarin] Hives   Atorvastatin Other (See Comments)   Muscle aches   Ciprofloxacin    Macrobid [nitrofurantoin]    Cause respiratory flare   Sulfa Antibiotics Itching, Rash        Medication List        Accurate as of May 10, 2021 10:38 AM. If you have any questions, ask your nurse or doctor.          acetaminophen 650 MG CR tablet Commonly known as: TYLENOL Take 650 mg by mouth every 8 (eight) hours as needed for pain.   amiodarone 200 MG tablet Commonly known as: PACERONE Take 200  mg by mouth daily.   apixaban 5 MG Tabs tablet Commonly known as: ELIQUIS Take 5 mg by mouth 2 (two) times daily.   calcium carbonate 500 MG chewable tablet Commonly known as: TUMS - dosed in mg elemental calcium Chew 1 tablet by mouth daily as needed for indigestion or heartburn.   cholecalciferol 25 MCG (1000 UNIT) tablet Commonly known as: VITAMIN D3 Take 1,000 Units by mouth daily.   cyclobenzaprine 5 MG tablet Commonly known as: FLEXERIL Take 5 mg by mouth daily as needed.   diphenhydrAMINE 25 MG tablet Commonly known as: BENADRYL Take 25 mg by mouth daily as needed for allergies.   docusate sodium 100 MG capsule Commonly known as: COLACE Take 1 capsule (100 mg total) by mouth 2 (two) times daily.   ferrous sulfate 325 (65 FE) MG EC tablet Take 1 tablet (325 mg total) by mouth 2 (two) times daily.   furosemide 20 MG  tablet Commonly known as: LASIX Take 2 tablets (40 mg total) by mouth daily.   gabapentin 100 MG capsule Commonly known as: NEURONTIN Take 200 mg by mouth 3 (three) times daily.   Gemtesa 75 MG Tabs Generic drug: Vibegron Take 1 capsule by mouth daily.   HYDROcodone-acetaminophen 5-325 MG tablet Commonly known as: NORCO/VICODIN Take 1 tablet by mouth 2 (two) times daily as needed for moderate pain.   Lactobacillus Tabs Take 1 tablet by mouth with breakfast, with lunch, and with evening meal.   levalbuterol 1.25 MG/0.5ML nebulizer solution Commonly known as: XOPENEX Take 1.25 mg by nebulization every 6 (six) hours as needed for wheezing or shortness of breath.   Melatonin 10 MG Tabs Take 10 mg by mouth at bedtime.   midodrine 5 MG tablet Commonly known as: PROAMATINE Take 1 tablet (5 mg total) by mouth 2 (two) times daily with a meal.   Multaq 400 MG tablet Generic drug: dronedarone Take 1 tablet (400 mg total) by mouth 2 (two) times daily with a meal.   multivitamin tablet Take 1 tablet by mouth daily.   omeprazole 40 MG capsule Commonly known as: PRILOSEC Take 1 capsule by mouth daily.   senna-docusate 8.6-50 MG tablet Commonly known as: Senokot-S Take 2 tablets by mouth at bedtime as needed for moderate constipation.   sucralfate 1 g tablet Commonly known as: CARAFATE Take 1 g by mouth at bedtime.        Allergies:  Allergies  Allergen Reactions   Coumadin [Warfarin] Hives   Atorvastatin Other (See Comments)    Muscle aches   Ciprofloxacin    Macrobid [Nitrofurantoin]     Cause respiratory flare   Sulfa Antibiotics Itching and Rash    Family History: Family History  Problem Relation Age of Onset   Alzheimer's disease Mother        died at 71   Cancer Mother    Tremor Mother    Pneumonia Father    Heart disease Father    Diabetes Father    Heart attack Father        died at 46    Social History:  reports that she has never smoked. She  has never used smokeless tobacco. She reports that she does not currently use alcohol. She reports that she does not use drugs.  ROS: All other review of systems were reviewed and are negative except what is noted above in HPI  Physical Exam: BP 127/79    Pulse 83    Wt 165 lb (74.8 kg)  BMI 31.18 kg/m   Constitutional:  Alert and oriented, No acute distress. HEENT: [place] AT, moist mucus membranes.  Trachea midline, no masses. Cardiovascular: No clubbing, cyanosis, or edema. Respiratory: Normal respiratory effort, no increased work of breathing. GI: Abdomen is soft, nontender, nondistended, no abdominal masses GU: No CVA tenderness.  Lymph: No cervical or inguinal lymphadenopathy. Skin: No rashes, bruises or suspicious lesions. Neurologic: Grossly intact, no focal deficits, moving all 4 extremities. Psychiatric: Normal mood and affect.  Laboratory Data: Lab Results  Component Value Date   WBC 18.5 (H) 01/16/2021   HGB 11.1 (L) 01/16/2021   HCT 38.0 01/16/2021   MCV 90.0 01/16/2021   PLT 628 (H) 01/16/2021    Lab Results  Component Value Date   CREATININE 0.84 01/16/2021    No results found for: PSA  No results found for: TESTOSTERONE  No results found for: HGBA1C  Urinalysis    Component Value Date/Time   COLORURINE YELLOW 01/16/2021 1538   APPEARANCEUR Clear 03/18/2021 0954   LABSPEC 1.005 01/16/2021 1538   PHURINE 7.0 01/16/2021 1538   GLUCOSEU Negative 03/18/2021 [place] 01/16/2021 1538   BILIRUBINUR Negative 03/18/2021 [place] 01/16/2021 1538   PROTEINUR Negative 03/18/2021 0954   PROTEINUR NEGATIVE 01/16/2021 1538   NITRITE Negative 03/18/2021 0954   NITRITE NEGATIVE 01/16/2021 1538   LEUKOCYTESUR 1+ (A) 03/18/2021 0954   LEUKOCYTESUR MODERATE (A) 01/16/2021 1538    Lab Results  Component Value Date   LABMICR See below: 03/18/2021   WBCUA 11-30 (A) 03/18/2021   LABEPIT >10 (A) 03/18/2021   BACTERIA Moderate (A)  03/18/2021    Pertinent Imaging:  No results found for this or any previous visit.  No results found for this or any previous visit.  No results found for this or any previous visit.  No results found for this or any previous visit.  No results found for this or any previous visit.  No results found for this or any previous visit.  No results found for this or any previous visit.  No results found for this or any previous visit.   Assessment & Plan:    1. Overactive bladder -continue gemtesa - Urinalysis, Routine w reflex microscopic - BLADDER SCAN AMB NON-IMAGING  2. Chronic cystitis with hematuria -urine for culture, augmentin BID for 7 days. We will then start trimethoprim 100mg  qhs -patient instructed to start taking a probiotic - Urinalysis, Routine w reflex microscopic - BLADDER SCAN AMB NON-IMAGING    No follow-ups on file.  Nicolette Bang, MD  [facility] [place]

## 2021-05-10 NOTE — Progress Notes (Signed)
post void residual =1ml 

## 2021-05-13 ENCOUNTER — Encounter: Payer: Self-pay | Admitting: Urology

## 2021-05-13 LAB — URINE CULTURE

## 2021-05-13 NOTE — Patient Instructions (Signed)

## 2021-06-08 DIAGNOSIS — J9611 Chronic respiratory failure with hypoxia: Secondary | ICD-10-CM | POA: Diagnosis not present

## 2021-06-11 ENCOUNTER — Telehealth: Payer: Self-pay | Admitting: Internal Medicine

## 2021-06-11 NOTE — Telephone Encounter (Signed)
Pt c/o Shortness Of Breath: STAT if SOB developed within the last 24 hours or pt is noticeably SOB on the phone ? ?1. Are you currently SOB (can you hear that pt is SOB on the phone)? no ? ?2. How long have you been experiencing SOB? At least the last 2 or 3 weeks, getting worse ? ?3. Are you SOB when sitting or when up moving around? Moving around ? ?4. Are you currently experiencing any other symptoms? Fatigue and some headaches ? ?Patient's daughter states the patient has been having increased SOB when moving around. She says she also has been very exhausted an believes it is heart related not lung related.  ? ?

## 2021-06-11 NOTE — Telephone Encounter (Signed)
I spoke with the pts daughter and she reports the pt has been having increased SOB with minimal exertion and increased fatigue... she does not think it is Pulmonary related... her O2 sat being 98% on 2 liters.. she is seeing Dr. Melvyn Novas but not until May... she wants her to see Dr. Lovena Le much sooner than June.... she feels it is related to stopping the Amiodarone and starting Multaq...  I advised her that she may also need to see her PCP... Dr. Nevada Crane for a full physical... I will also forward to Dr. Lovena Le and his nurse for review.  ?

## 2021-06-12 NOTE — Telephone Encounter (Signed)
Returned call to Pt's daughter. ? ?Per daughter Pt has been fatigued and SOB. ? ?Daughter states her BP/pulse have been WNL. ? ?She notes that when Pt ambulates her oxygen level will drop into the 70-80's but this returns to upper 90's when she sits down.  She is currently on oxygen at home.  Per her pulmonologist she can be on 2-4L.  Daughter states it is a challenge for the Pt to increase the oxygen as needed. ? ?Pt evaluated by GT in Januay 2023.  Pt was advised to continue amiodarone until 05/08/2021.  At that date Pt stopped amiodarone and started Multaq 400 mg PO BID.  Plan is to follow up with Pt in June 2023 with plan to recheck amio level and plan for dofetilide admission. ? ?Additionally Pt has some chest pressure in the morning, but this resolves as the day goes on. ? ?Daughter is unsure who to follow up with regarding this issue:  cardiac vs pulmonary.  ? ?Advised would discuss with primary cardiology to determine next steps. ? ?

## 2021-06-13 ENCOUNTER — Emergency Department (HOSPITAL_COMMUNITY): Payer: Medicare Other

## 2021-06-13 ENCOUNTER — Inpatient Hospital Stay (HOSPITAL_COMMUNITY)
Admission: EM | Admit: 2021-06-13 | Discharge: 2021-06-15 | DRG: 205 | Disposition: A | Discharge status: Home / Self Care | Payer: Medicare Other | Attending: Internal Medicine | Admitting: Internal Medicine

## 2021-06-13 ENCOUNTER — Encounter (HOSPITAL_COMMUNITY): Payer: Self-pay | Admitting: *Deleted

## 2021-06-13 ENCOUNTER — Other Ambulatory Visit: Payer: Self-pay

## 2021-06-13 DIAGNOSIS — M810 Age-related osteoporosis without current pathological fracture: Secondary | ICD-10-CM | POA: Diagnosis present

## 2021-06-13 DIAGNOSIS — J9621 Acute and chronic respiratory failure with hypoxia: Secondary | ICD-10-CM | POA: Diagnosis not present

## 2021-06-13 DIAGNOSIS — J704 Drug-induced interstitial lung disorders, unspecified: Secondary | ICD-10-CM | POA: Diagnosis not present

## 2021-06-13 DIAGNOSIS — G250 Essential tremor: Secondary | ICD-10-CM | POA: Diagnosis present

## 2021-06-13 DIAGNOSIS — G4700 Insomnia, unspecified: Secondary | ICD-10-CM | POA: Diagnosis not present

## 2021-06-13 DIAGNOSIS — D75839 Thrombocytosis, unspecified: Secondary | ICD-10-CM | POA: Diagnosis not present

## 2021-06-13 DIAGNOSIS — Z833 Family history of diabetes mellitus: Secondary | ICD-10-CM

## 2021-06-13 DIAGNOSIS — K219 Gastro-esophageal reflux disease without esophagitis: Secondary | ICD-10-CM | POA: Diagnosis present

## 2021-06-13 DIAGNOSIS — Z881 Allergy status to other antibiotic agents status: Secondary | ICD-10-CM | POA: Diagnosis not present

## 2021-06-13 DIAGNOSIS — T378X5A Adverse effect of other specified systemic anti-infectives and antiparasitics, initial encounter: Secondary | ICD-10-CM | POA: Diagnosis not present

## 2021-06-13 DIAGNOSIS — Z809 Family history of malignant neoplasm, unspecified: Secondary | ICD-10-CM

## 2021-06-13 DIAGNOSIS — X58XXXA Exposure to other specified factors, initial encounter: Secondary | ICD-10-CM | POA: Diagnosis present

## 2021-06-13 DIAGNOSIS — Z888 Allergy status to other drugs, medicaments and biological substances status: Secondary | ICD-10-CM | POA: Diagnosis not present

## 2021-06-13 DIAGNOSIS — I482 Chronic atrial fibrillation, unspecified: Secondary | ICD-10-CM | POA: Diagnosis not present

## 2021-06-13 DIAGNOSIS — R8281 Pyuria: Secondary | ICD-10-CM | POA: Diagnosis not present

## 2021-06-13 DIAGNOSIS — R0602 Shortness of breath: Principal | ICD-10-CM

## 2021-06-13 DIAGNOSIS — Z820 Family history of epilepsy and other diseases of the nervous system: Secondary | ICD-10-CM

## 2021-06-13 DIAGNOSIS — I5032 Chronic diastolic (congestive) heart failure: Secondary | ICD-10-CM | POA: Diagnosis present

## 2021-06-13 DIAGNOSIS — I700 Atherosclerosis of aorta: Secondary | ICD-10-CM | POA: Diagnosis not present

## 2021-06-13 DIAGNOSIS — E559 Vitamin D deficiency, unspecified: Secondary | ICD-10-CM | POA: Diagnosis not present

## 2021-06-13 DIAGNOSIS — Z96652 Presence of left artificial knee joint: Secondary | ICD-10-CM | POA: Diagnosis present

## 2021-06-13 DIAGNOSIS — Z7901 Long term (current) use of anticoagulants: Secondary | ICD-10-CM | POA: Diagnosis not present

## 2021-06-13 DIAGNOSIS — Z8673 Personal history of transient ischemic attack (TIA), and cerebral infarction without residual deficits: Secondary | ICD-10-CM | POA: Diagnosis not present

## 2021-06-13 DIAGNOSIS — J8410 Pulmonary fibrosis, unspecified: Secondary | ICD-10-CM | POA: Diagnosis not present

## 2021-06-13 DIAGNOSIS — I959 Hypotension, unspecified: Secondary | ICD-10-CM | POA: Diagnosis present

## 2021-06-13 DIAGNOSIS — J189 Pneumonia, unspecified organism: Secondary | ICD-10-CM

## 2021-06-13 DIAGNOSIS — R7989 Other specified abnormal findings of blood chemistry: Secondary | ICD-10-CM | POA: Diagnosis not present

## 2021-06-13 DIAGNOSIS — R519 Headache, unspecified: Secondary | ICD-10-CM | POA: Diagnosis not present

## 2021-06-13 DIAGNOSIS — D649 Anemia, unspecified: Secondary | ICD-10-CM | POA: Diagnosis not present

## 2021-06-13 DIAGNOSIS — R109 Unspecified abdominal pain: Secondary | ICD-10-CM | POA: Diagnosis not present

## 2021-06-13 DIAGNOSIS — T50905A Adverse effect of unspecified drugs, medicaments and biological substances, initial encounter: Secondary | ICD-10-CM

## 2021-06-13 DIAGNOSIS — Z9049 Acquired absence of other specified parts of digestive tract: Secondary | ICD-10-CM

## 2021-06-13 DIAGNOSIS — Z20822 Contact with and (suspected) exposure to covid-19: Secondary | ICD-10-CM | POA: Diagnosis not present

## 2021-06-13 DIAGNOSIS — I480 Paroxysmal atrial fibrillation: Secondary | ICD-10-CM | POA: Diagnosis not present

## 2021-06-13 DIAGNOSIS — I110 Hypertensive heart disease with heart failure: Secondary | ICD-10-CM | POA: Diagnosis not present

## 2021-06-13 DIAGNOSIS — Z86711 Personal history of pulmonary embolism: Secondary | ICD-10-CM

## 2021-06-13 DIAGNOSIS — N3281 Overactive bladder: Secondary | ICD-10-CM | POA: Diagnosis present

## 2021-06-13 DIAGNOSIS — Z79890 Hormone replacement therapy (postmenopausal): Secondary | ICD-10-CM

## 2021-06-13 DIAGNOSIS — Z8249 Family history of ischemic heart disease and other diseases of the circulatory system: Secondary | ICD-10-CM

## 2021-06-13 DIAGNOSIS — I517 Cardiomegaly: Secondary | ICD-10-CM | POA: Diagnosis not present

## 2021-06-13 DIAGNOSIS — Z66 Do not resuscitate: Secondary | ICD-10-CM | POA: Diagnosis not present

## 2021-06-13 DIAGNOSIS — R82998 Other abnormal findings in urine: Secondary | ICD-10-CM

## 2021-06-13 DIAGNOSIS — Z87440 Personal history of urinary (tract) infections: Secondary | ICD-10-CM

## 2021-06-13 DIAGNOSIS — E785 Hyperlipidemia, unspecified: Secondary | ICD-10-CM | POA: Diagnosis present

## 2021-06-13 LAB — URINALYSIS, ROUTINE W REFLEX MICROSCOPIC
Bilirubin Urine: NEGATIVE
Glucose, UA: NEGATIVE mg/dL
Hgb urine dipstick: NEGATIVE
Ketones, ur: NEGATIVE mg/dL
Nitrite: NEGATIVE
Protein, ur: NEGATIVE mg/dL
Specific Gravity, Urine: 1.005 (ref 1.005–1.030)
pH: 6 (ref 5.0–8.0)

## 2021-06-13 LAB — RESP PANEL BY RT-PCR (FLU A&B, COVID) ARPGX2
Influenza A by PCR: NEGATIVE
Influenza B by PCR: NEGATIVE
SARS Coronavirus 2 by RT PCR: NEGATIVE

## 2021-06-13 LAB — LIPASE, BLOOD: Lipase: 43 U/L (ref 11–51)

## 2021-06-13 LAB — BASIC METABOLIC PANEL
Anion gap: 7 (ref 5–15)
BUN: 21 mg/dL (ref 8–23)
CO2: 23 mmol/L (ref 22–32)
Calcium: 8.7 mg/dL — ABNORMAL LOW (ref 8.9–10.3)
Chloride: 108 mmol/L (ref 98–111)
Creatinine, Ser: 1.03 mg/dL — ABNORMAL HIGH (ref 0.44–1.00)
GFR, Estimated: 56 mL/min — ABNORMAL LOW (ref >60)
Glucose, Bld: 110 mg/dL — ABNORMAL HIGH (ref 70–99)
Potassium: 3.9 mmol/L (ref 3.5–5.1)
Sodium: 138 mmol/L (ref 135–145)

## 2021-06-13 LAB — CBC
HCT: 29.1 % — ABNORMAL LOW (ref 36.0–46.0)
Hemoglobin: 9.1 g/dL — ABNORMAL LOW (ref 12.0–15.0)
MCH: 30.7 pg (ref 26.0–34.0)
MCHC: 31.3 g/dL (ref 30.0–36.0)
MCV: 98.3 fL (ref 80.0–100.0)
Platelets: 435 10*3/uL — ABNORMAL HIGH (ref 150–400)
RBC: 2.96 MIL/uL — ABNORMAL LOW (ref 3.87–5.11)
RDW: 12.5 % (ref 11.5–15.5)
WBC: 6.9 10*3/uL (ref 4.0–10.5)
nRBC: 0 % (ref 0.0–0.2)

## 2021-06-13 LAB — TROPONIN I (HIGH SENSITIVITY)
Troponin I (High Sensitivity): 4 ng/L (ref <18)
Troponin I (High Sensitivity): 4 ng/L (ref <18)

## 2021-06-13 LAB — HEPATIC FUNCTION PANEL
ALT: 13 U/L (ref 0–44)
AST: 24 U/L (ref 15–41)
Albumin: 3.9 g/dL (ref 3.5–5.0)
Alkaline Phosphatase: 74 U/L (ref 38–126)
Bilirubin, Direct: 0.1 mg/dL (ref 0.0–0.2)
Indirect Bilirubin: 0.5 mg/dL (ref 0.3–0.9)
Total Bilirubin: 0.6 mg/dL (ref 0.3–1.2)
Total Protein: 7.2 g/dL (ref 6.5–8.1)

## 2021-06-13 LAB — D-DIMER, QUANTITATIVE: D-Dimer, Quant: 0.39 ug/mL-FEU (ref 0.00–0.50)

## 2021-06-13 LAB — BRAIN NATRIURETIC PEPTIDE: B Natriuretic Peptide: 121 pg/mL — ABNORMAL HIGH (ref 0.0–100.0)

## 2021-06-13 LAB — SEDIMENTATION RATE: Sed Rate: 40 mm/hr — ABNORMAL HIGH (ref 0–22)

## 2021-06-13 IMAGING — CT CT HEAD W/O CM
4 series · 16 of 47 positions shown, 18 images · non-contrast
Comparison: MR head [DATE]

CLINICAL DATA: Shortness of breath, worsening headache over past
couple days, abdominal pain, history of heart failure, atrial
fibrillation, stroke



[Series 2: head w o · axial · 0.45mm/px · z∈[+33,+133]mm · 6 of 30 slices shown, 8 images]
[im 5/30  brain]
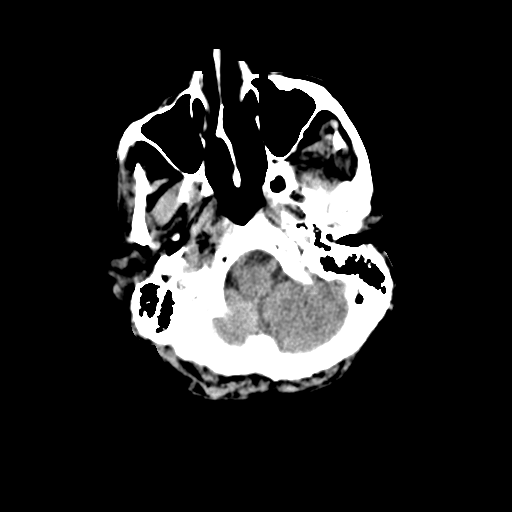
[im 5/30  bone]
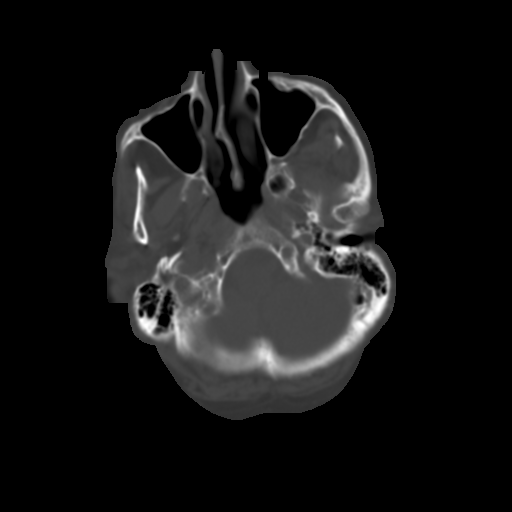
[im 9/30  brain]
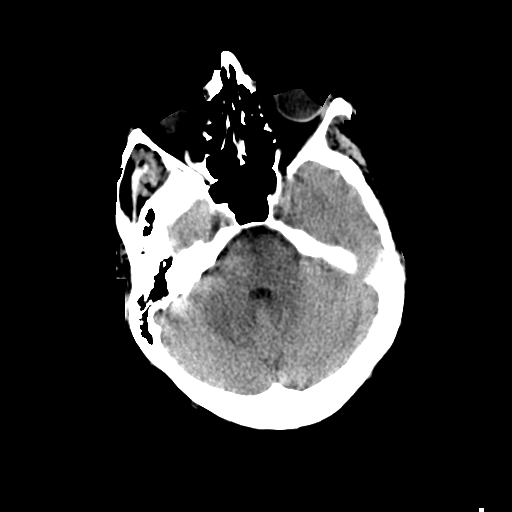
[im 13/30  brain]
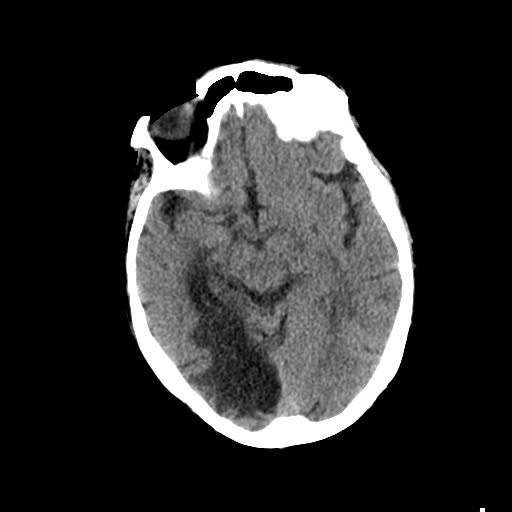
[im 17/30  brain]
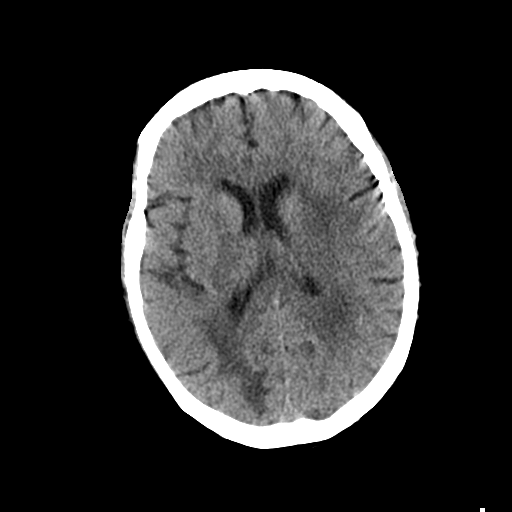
[im 21/30  brain]
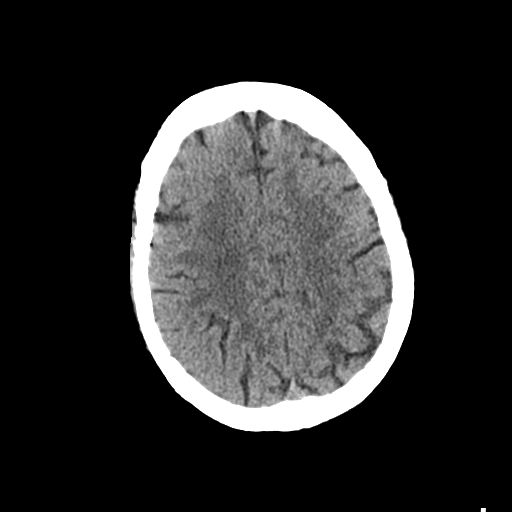
[im 21/30  bone]
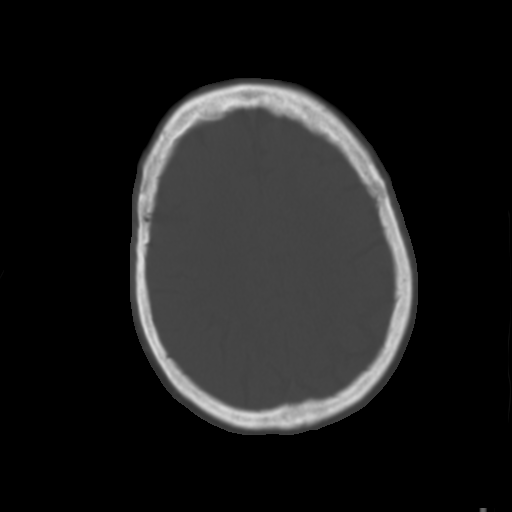
[im 25/30  brain]
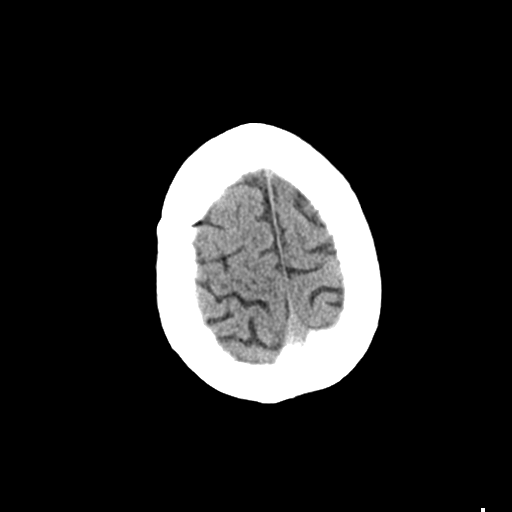

[Series 3: head bone · axial · 0.45mm/px · z∈[+27,+77]mm · 4 of 76 slices shown]
[im 8/76  bone]
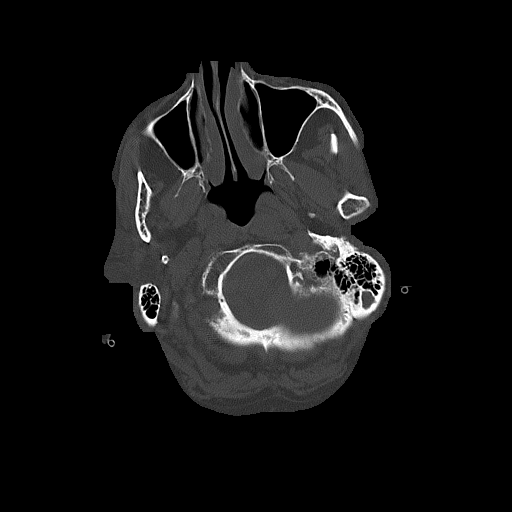
[im 15/76  bone]
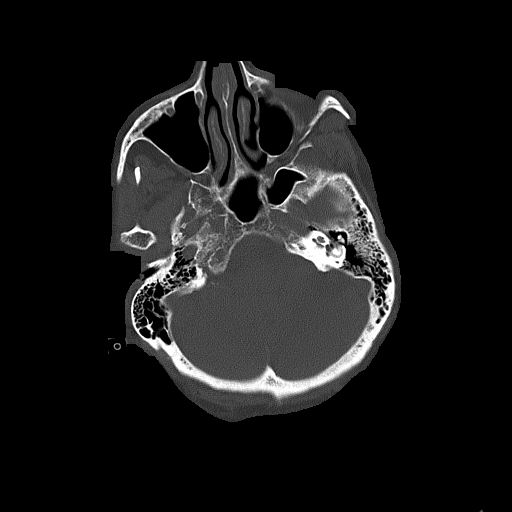
[im 26/76  bone]
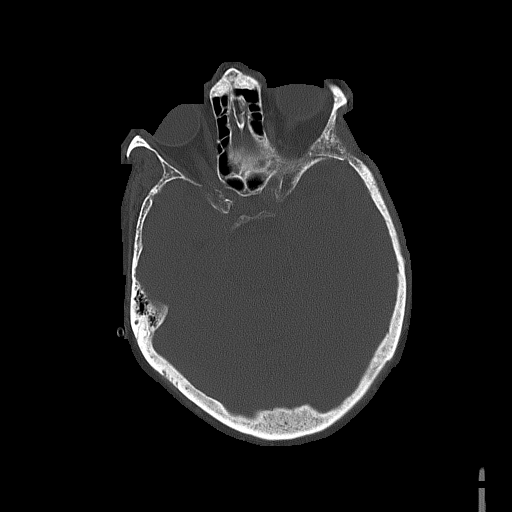
[im 33/76  bone]
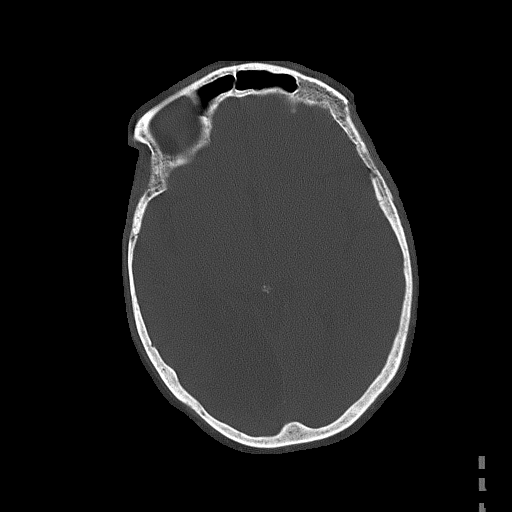

[Series 4: coronal soft · coronal · 0.31mm/px · 3 of 67 slices shown]
[im 23/67  brain]
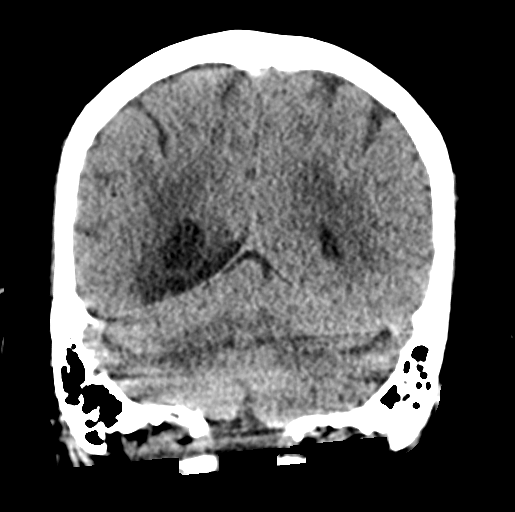
[im 30/67  brain]
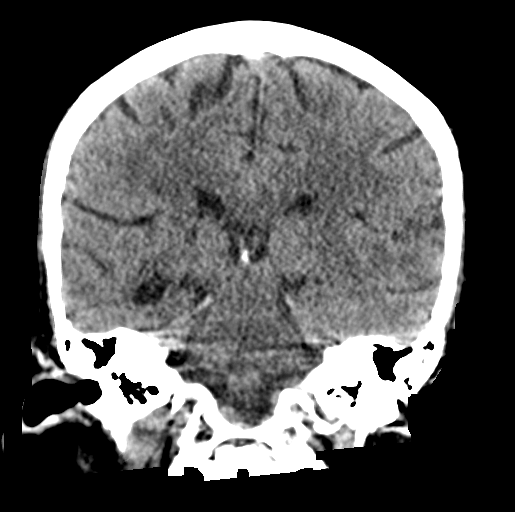
[im 37/67  brain]
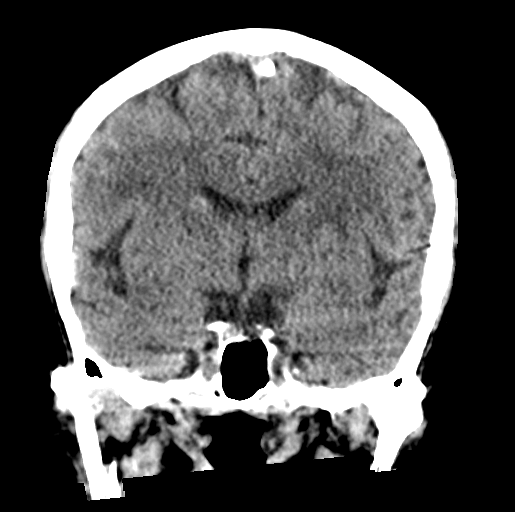

[Series 5: sagittal soft · sagittal · 0.31mm/px · 3 of 50 slices shown]
[im 17/50  brain]
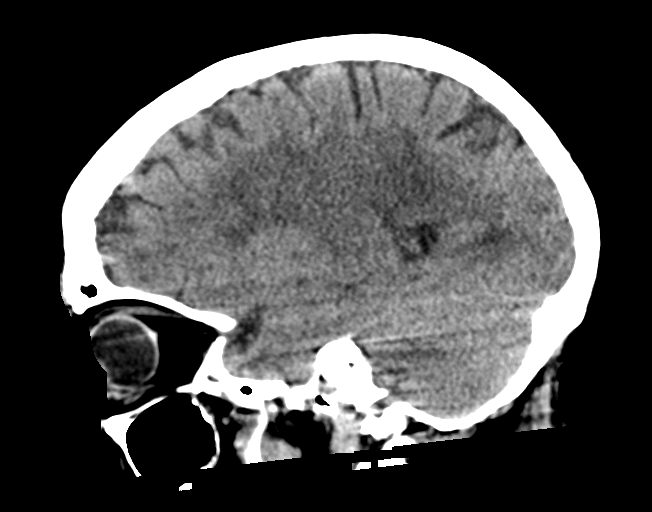
[im 25/50  brain]
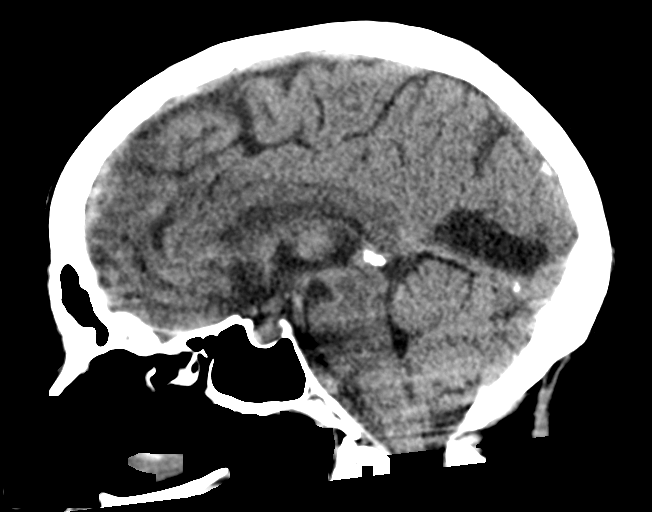
[im 33/50  brain]
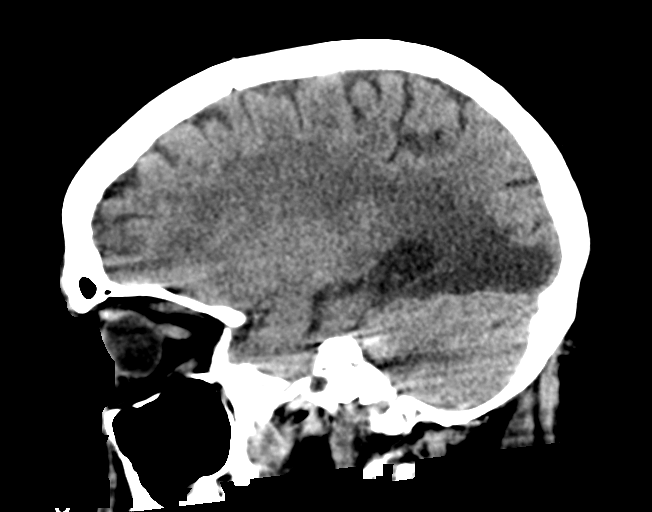

[16 of 47 positions shown; findings below may reference images not displayed]

FINDINGS: Brain: Mild atrophy. Normal ventricular morphology. No midline shift
or mass effect. Small vessel chronic ischemic changes of deep
cerebral white matter. Old RIGHT occipital and temporo-occipital
infarcts unchanged. No intracranial hemorrhage, mass lesion, or
evidence of acute infarction. No extra-axial fluid collections.

Vascular: No hyperdense vessels. Minimal atherosclerotic
calcification of internal carotid arteries at skull base

Skull: Intact

Sinuses/Orbits: Clear

Other: N/A
IMPRESSION: Atrophy with small vessel chronic ischemic changes of deep cerebral
white matter.

Old RIGHT occipital and temporo-occipital infarcts.

No acute intracranial abnormalities.

## 2021-06-13 IMAGING — CT CT ABD-PELV W/ CM
2 of 5 series · 15 of 46 positions shown, 17 images · IV contrast (Omnipaque or Isovue)
Comparison: None.

CLINICAL DATA: Abdominal pain, acute, nonlocalized

EXAM:
CT ABDOMEN AND PELVIS WITH CONTRAST
TECHNIQUE: Multidetector CT imaging of the abdomen and pelvis was performed
using the standard protocol following bolus administration of
intravenous contrast.

[Series 2: axial (person_name) · axial · 0.77mm/px · z∈[-644,-219]mm · 12 of 97 slices shown, 14 images]
[im 6/97  soft-tissue]
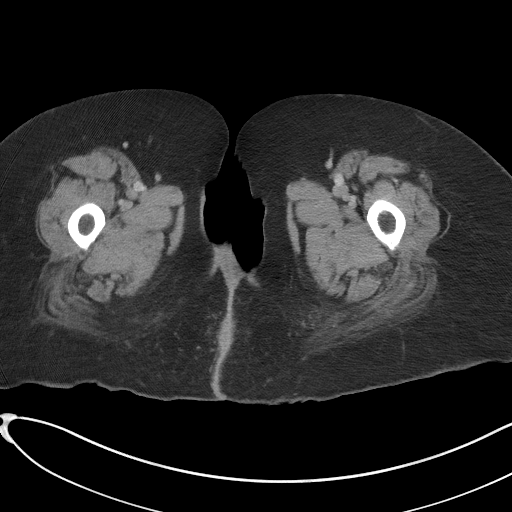
[im 6/97  bone]
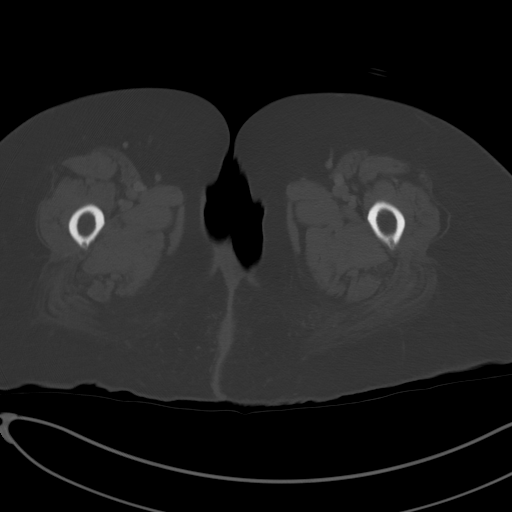
[im 17/97  soft-tissue]
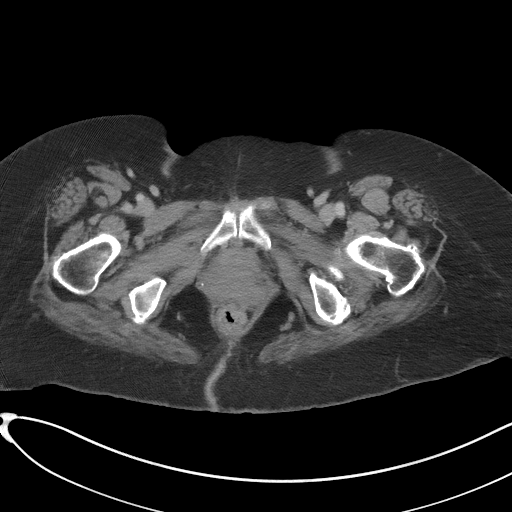
[im 22/97  soft-tissue]
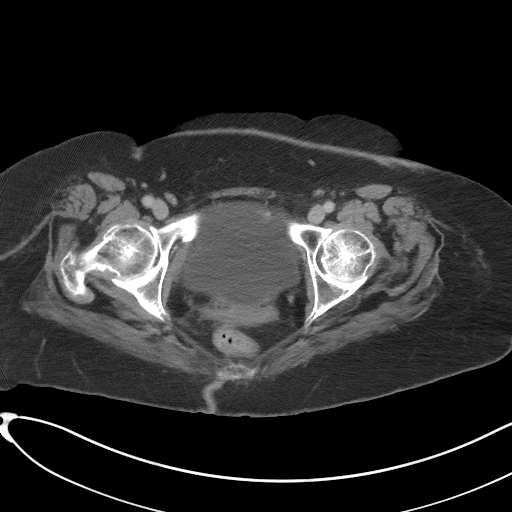
[im 27/97  soft-tissue]
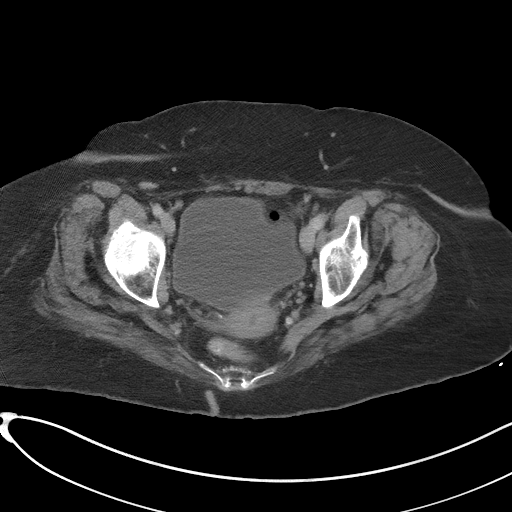
[im 38/97  soft-tissue]
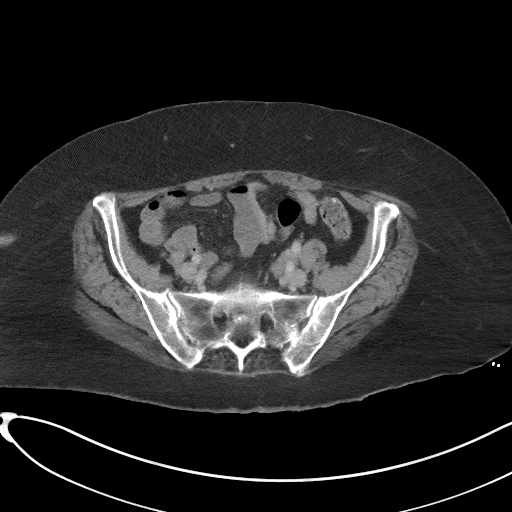
[im 43/97  soft-tissue]
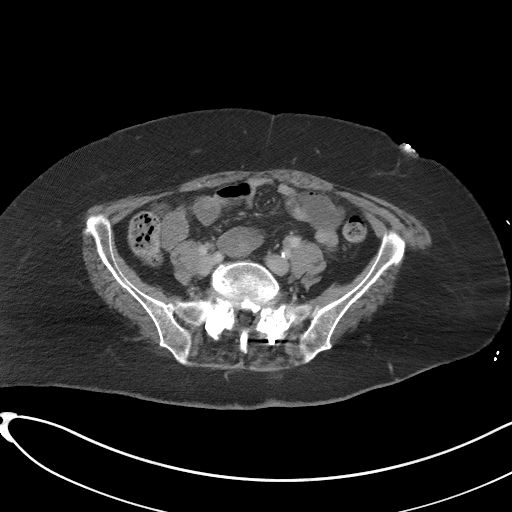
[im 54/97  soft-tissue]
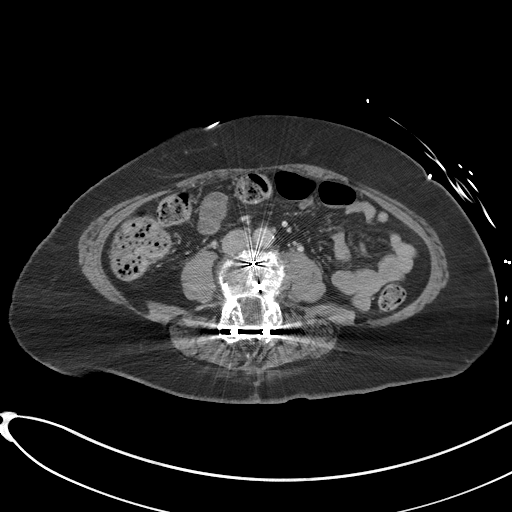
[im 59/97  soft-tissue]
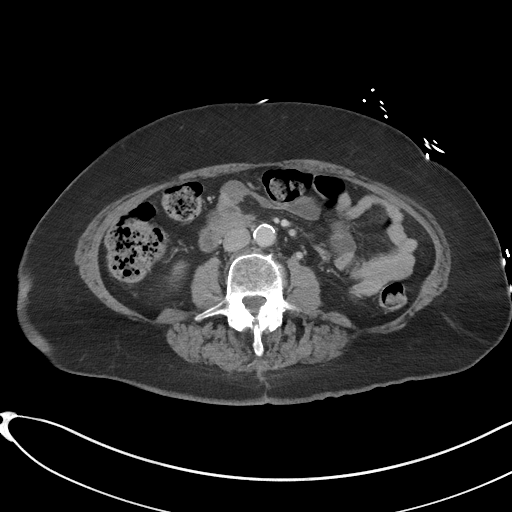
[im 70/97  soft-tissue]
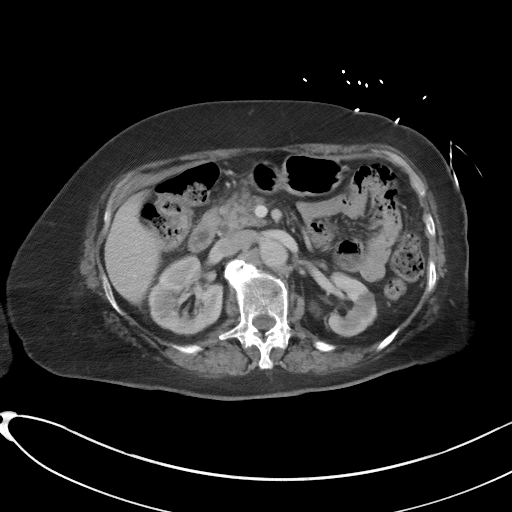
[im 70/97  bone]
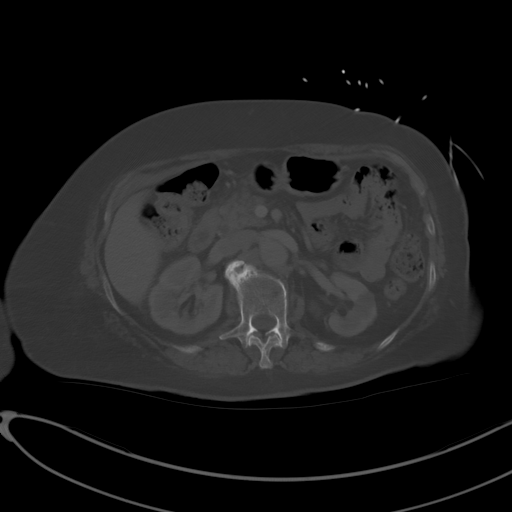
[im 75/97  soft-tissue]
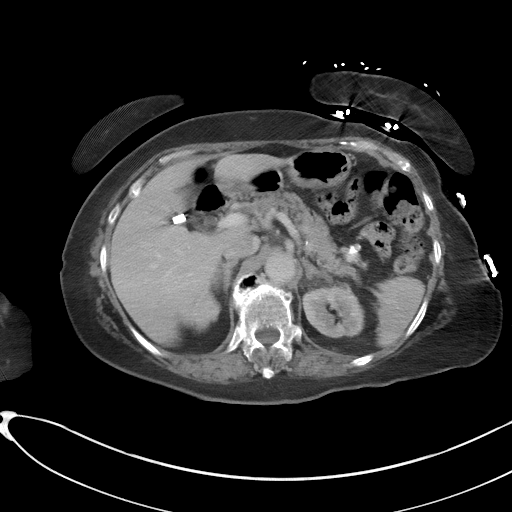
[im 81/97  soft-tissue]
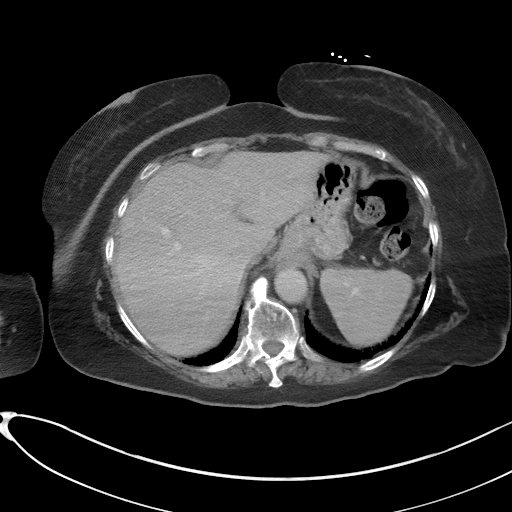
[im 91/97  soft-tissue]
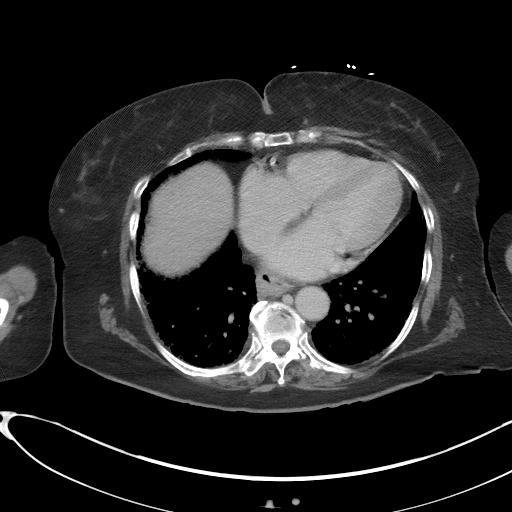

[Series 5: coronal (person_name) · coronal · 0.83mm/px · 3 of 98 slices shown]
[im 33/98  soft-tissue]
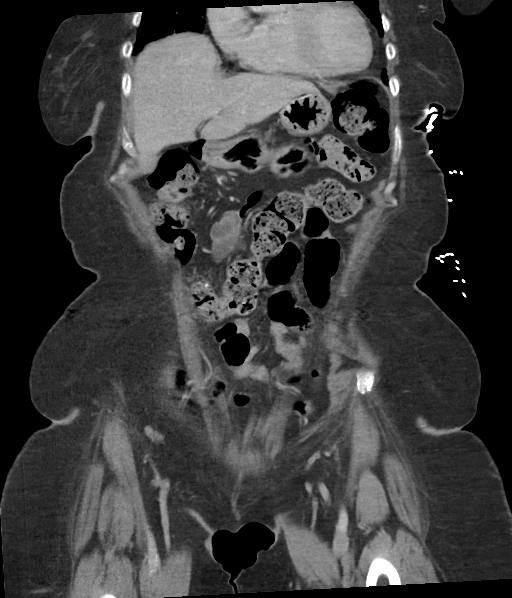
[im 44/98  soft-tissue]
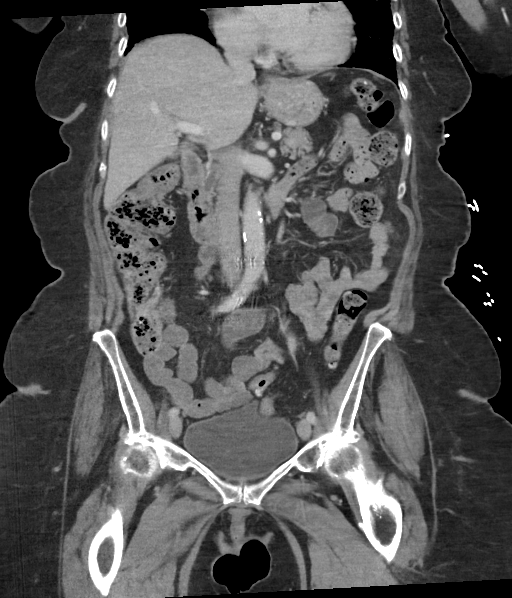
[im 54/98  soft-tissue]
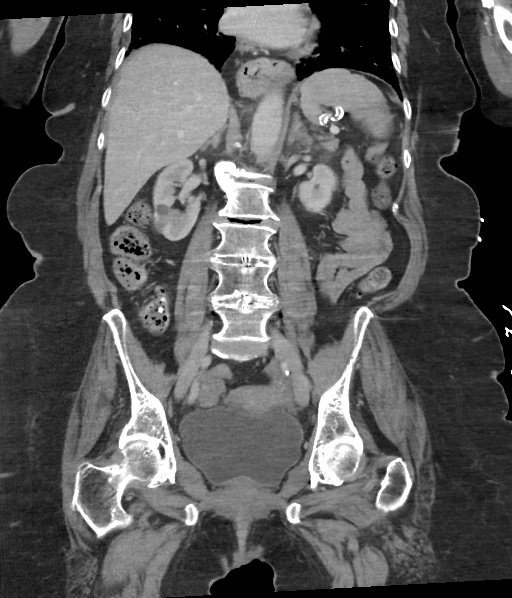

[15 of 46 positions shown; findings below may reference images not displayed]

RADIATION DOSE REDUCTION: This exam was performed according to the
departmental dose-optimization program which includes automated
exposure control, adjustment of the mA and/or kV according to
patient size and/or use of iterative reconstruction technique.

CONTRAST:  100mL OMNIPAQUE IOHEXOL 300 MG/ML  SOLN
FINDINGS: Lower chest: Mild bibasilar pulmonary fibrotic change. At least
moderate coronary artery calcification. Small hiatal hernia.

Hepatobiliary: No focal liver abnormality is seen. Status post
cholecystectomy. No biliary dilatation.

Pancreas: Unremarkable

Spleen: Unremarkable

Adrenals/Urinary Tract: The adrenal glands are unremarkable. The
kidneys are normal in size and position. Multiple low-attenuation
lesions are seen scattered throughout the kidneys bilaterally,
majority of which are too small to characterize, however, the
largest within the lower pole the right kidney represents a simple
cortical cyst. In absence of prior imaging, this may be better
assessed with dedicated renal sonography for definitive
characterization. No solid enhancing intrarenal masses are
identified. No hydronephrosis. No intrarenal or ureteral calculi.
The bladder is unremarkable.

Stomach/Bowel: The stomach, small bowel, and large bowel are
unremarkable save for mild sigmoid diverticulosis. No evidence of
obstruction or focal inflammation. The appendix is diminutive but is
otherwise unremarkable. No free intraperitoneal gas or fluid.

Vascular/Lymphatic: Aortic atherosclerosis. No enlarged abdominal or
pelvic lymph nodes.

Reproductive: Uterus and bilateral adnexa are unremarkable.

Other: No abdominal wall hernia.  Rectum unremarkable.

Musculoskeletal: Degenerative changes are seen within the lumbar
spine. No lytic more blastic bone lesions are seen. L3-L5 lumbar
fusion with instrumentation and posterior decompression has been
performed.
IMPRESSION: No acute intra-abdominal pathology. No definite radiographic
explanation for the patient's reported symptoms.

Multiple low-attenuation lesions within the kidneys, the majority of
which are too small to accurately characterized on this examination.
Dedicated renal sonography may be helpful, on a nonemergent basis,
for definitive characterization if prior examinations are
unavailable to document stability.

Aortic Atherosclerosis ([DV]-[DV]).

## 2021-06-13 IMAGING — DX DG CHEST 1V PORT
1 series · 1 of 1 positions shown · non-contrast
Comparison: [DATE]

CLINICAL DATA: Shortness of breath

EXAM:
PORTABLE CHEST 1 VIEW

[chest ap]
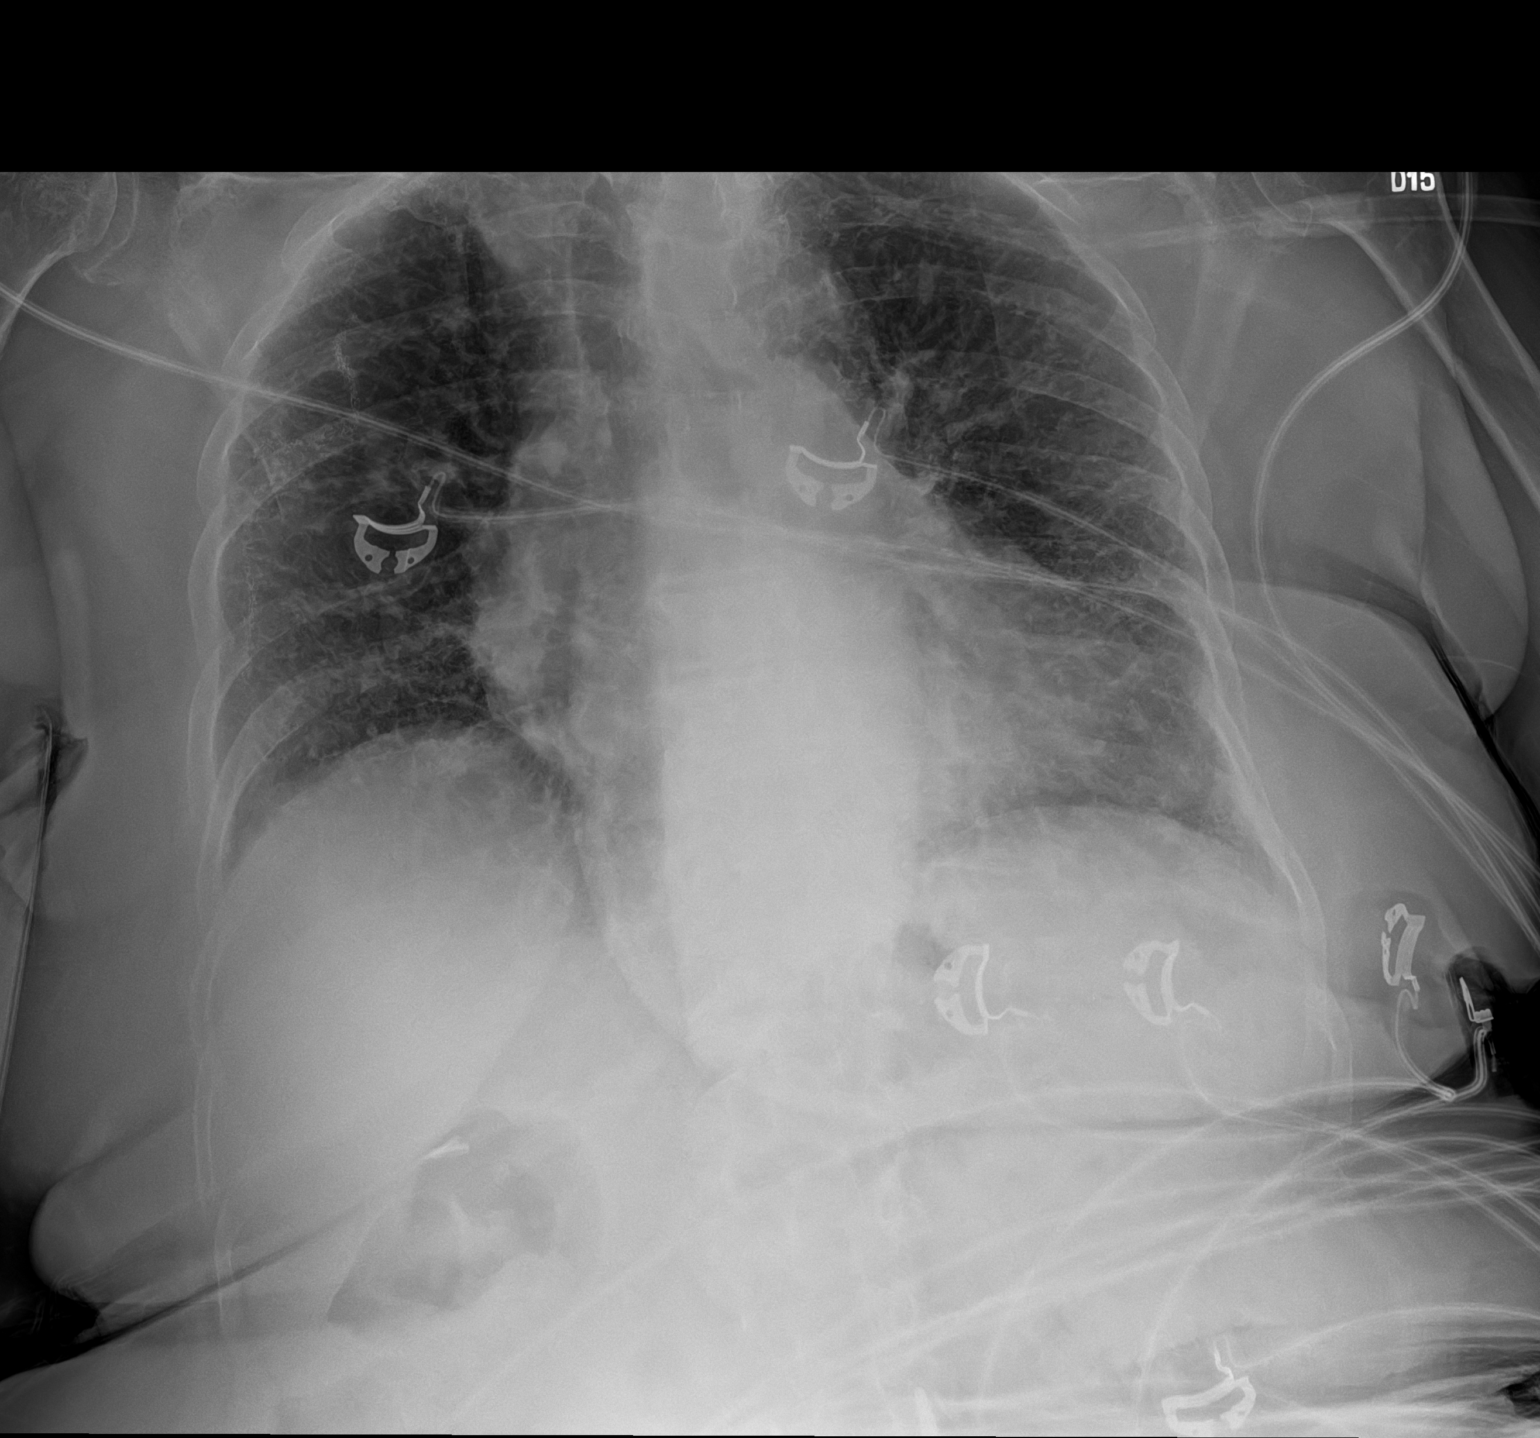

[1 of 1 positions shown; findings below may reference images not displayed]

FINDINGS: Cardiomegaly. Diffuse, somewhat coarse appearing bilateral
interstitial pulmonary opacity. Suture lines of the right lung. The
visualized skeletal structures are unremarkable.
IMPRESSION: Cardiomegaly with diffuse, somewhat coarse appearing bilateral
interstitial pulmonary opacity, consistent with fibrosis. No new or
acute appearing airspace opacity.

## 2021-06-13 MED ORDER — IPRATROPIUM-ALBUTEROL 0.5-2.5 (3) MG/3ML IN SOLN
RESPIRATORY_TRACT | Status: AC
  Filled 2021-06-13: qty 3

## 2021-06-13 MED ORDER — ALBUTEROL SULFATE (2.5 MG/3ML) 0.083% IN NEBU: 5.0000 mg | INHALATION_SOLUTION | Freq: Once | RESPIRATORY_TRACT | Status: DC

## 2021-06-13 MED ORDER — FUROSEMIDE 10 MG/ML IJ SOLN
40.0000 mg | Freq: Once | INTRAMUSCULAR | Status: AC
  Administered 2021-06-13: 40 mg via INTRAVENOUS
  Filled 2021-06-13: qty 4

## 2021-06-13 MED ORDER — IPRATROPIUM-ALBUTEROL 0.5-2.5 (3) MG/3ML IN SOLN
3.0000 mL | Freq: Once | RESPIRATORY_TRACT | Status: AC
  Administered 2021-06-13: 3 mL via RESPIRATORY_TRACT

## 2021-06-13 MED ORDER — SODIUM CHLORIDE 0.9 % IV SOLN
1.0000 g | Freq: Once | INTRAVENOUS | Status: AC
Start: 2021-06-13 — End: 2021-06-13
  Administered 2021-06-13: 1 g via INTRAVENOUS
  Filled 2021-06-13: qty 10

## 2021-06-13 MED ORDER — ACETAMINOPHEN 325 MG PO TABS
650.0000 mg | ORAL_TABLET | Freq: Once | ORAL | Status: AC
  Administered 2021-06-13: 650 mg via ORAL
  Filled 2021-06-13: qty 2

## 2021-06-13 MED ORDER — IOHEXOL 300 MG/ML  SOLN
100.0000 mL | Freq: Once | INTRAMUSCULAR | Status: AC | PRN
  Administered 2021-06-13: 100 mL via INTRAVENOUS

## 2021-06-13 MED ORDER — HYDROCODONE-ACETAMINOPHEN 5-325 MG PO TABS
1.0000 | ORAL_TABLET | Freq: Once | ORAL | Status: AC
  Administered 2021-06-13: 1 via ORAL
  Filled 2021-06-13: qty 1

## 2021-06-13 MED ORDER — IPRATROPIUM BROMIDE 0.02 % IN SOLN: 0.5000 mg | Freq: Once | RESPIRATORY_TRACT | Status: DC

## 2021-06-13 MED ORDER — METHYLPREDNISOLONE SODIUM SUCC 125 MG IJ SOLR
80.0000 mg | Freq: Once | INTRAMUSCULAR | Status: AC
  Administered 2021-06-13: 80 mg via INTRAVENOUS
  Filled 2021-06-13: qty 2

## 2021-06-13 NOTE — Telephone Encounter (Signed)
Pt has gone to ED for evaluation. ? ?Await further needs. ?

## 2021-06-13 NOTE — ED Triage Notes (Signed)
Shortness of breath

## 2021-06-13 NOTE — ED Provider Notes (Addendum)
?[facility] ?Provider Note ? ? ?CSN: [id number] ?Arrival date & time: 06/13/21  1401 ? ?  ? ?History ? ?Chief Complaint  ?Patient presents with  ? Shortness of Breath  ? ? ?Monique Robles is a 81 y.o. female with history significant for atrial fibrillation, chronic respiratory failure (on 2L [place]) secondary to interstitial lung disease, also hx of CHF and history of PE on Eliquis presenting with increasing shortness of breath with exertion.  Dg at bedside states she is stable at rest but any attempts at ambulation causes consideration sob and fatigue.  She is currently being weaned off amiodarone over concerns this is worsening her lung disease. Currently on Multaq with anticipated admission in June if amiodarone is completely weaned  in order to start dofetilide. Pt denies chest pain, palpitations, fevers or chills.  She does endorse an occasional nonproductive cough.  Denies wheezing, denies increased peripheral edema (but has chronic edema).  She sleeps elevated, no recognized orthopnea. She takes lasix daily for chf and control of peripheral edema.  ? ?The history is provided by the patient, a relative and medical records.  ? ? ? ?  ? ?Home Medications ?Prior to Admission medications   ?Medication Sig Start Date End Date Taking? Authorizing Provider  ?acetaminophen (TYLENOL) 650 MG CR tablet Take 650 mg by mouth every 8 (eight) hours as needed for pain.   Yes [provider]  ?apixaban (ELIQUIS) 5 MG TABS tablet Take 5 mg by mouth 2 (two) times daily.   Yes [provider]  ?Cholecalciferol (VITAMIN D3) 1.25 MG (50000 UT) CAPS Take 1 capsule by mouth once a week. Monday at lunch 04/17/21  Yes [provider]  ?cyclobenzaprine (FLEXERIL) 5 MG tablet Take 5 mg by mouth daily as needed. 07/31/20  Yes [provider]  ?diphenhydrAMINE (BENADRYL) 25 MG tablet Take 25 mg by mouth daily as needed for allergies.   Yes [provider]  ?docusate sodium (COLACE) 100  MG capsule Take 1 capsule (100 mg total) by mouth 2 (two) times daily. 01/07/21  Yes Kathie Dike, MD  ?dronedarone (MULTAQ) 400 MG tablet Take 1 tablet (400 mg total) by mouth 2 (two) times daily with a meal. ?Patient taking differently: Take 400 mg by mouth See admin instructions. Take 1 tablet in the morning and 1 tablet at 6-630 pm with meals 03/26/21  Yes Evans Lance, MD  ?furosemide (LASIX) 20 MG tablet Take 2 tablets (40 mg total) by mouth daily. 01/07/21  Yes Kathie Dike, MD  ?gabapentin (NEURONTIN) 100 MG capsule Take 200 mg by mouth See admin instructions. Take 200 mg in the morning and mid day   Yes [provider]  ?HYDROcodone-acetaminophen (NORCO/VICODIN) 5-325 MG tablet Take 1 tablet by mouth 2 (two) times daily as needed for moderate pain. 11/29/20  Yes Amin, Jeanella Flattery, MD  ?levalbuterol (XOPENEX) 1.25 MG/0.5ML nebulizer solution Take 1.25 mg by nebulization every 6 (six) hours as needed for wheezing or shortness of breath. 11/29/20  Yes Amin, Jeanella Flattery, MD  ?Melatonin 10 MG TABS Take 10 mg by mouth at bedtime.   Yes [provider]  ?midodrine (PROAMATINE) 5 MG tablet Take 1 tablet (5 mg total) by mouth 2 (two) times daily with a meal. 01/29/21  Yes Evans Lance, MD  ?Multiple Vitamin (MULTIVITAMIN) tablet Take 1 tablet by mouth daily.   Yes [provider]  ?omeprazole (PRILOSEC) 40 MG capsule Take 1 capsule by mouth daily. 06/21/20  Yes [provider]  ?ondansetron (ZOFRAN) 4 MG tablet Take 4 mg by mouth every 8 (eight) hours as needed. 04/17/21  Yes [provider]  ?sucralfate (CARAFATE) 1 g tablet Take 1 g by mouth at bedtime.   Yes [provider]  ?trimethoprim (TRIMPEX) 100 MG tablet Take 1 tablet (100 mg total) by mouth at bedtime. 05/10/21  Yes McKenzie, Candee Furbish, MD  ?Vibegron (GEMTESA) 75 MG TABS Take 1 capsule by mouth daily. 05/10/21  Yes McKenzie, Candee Furbish, MD  ?amoxicillin-clavulanate (AUGMENTIN) 875-125 MG tablet  Take 1 tablet by mouth every 12 (twelve) hours. ?Patient not taking: Reported on 06/13/2021 05/10/21   Cleon Gustin, MD  ?calcium carbonate (TUMS - DOSED IN MG ELEMENTAL CALCIUM) 500 MG chewable tablet Chew 1 tablet by mouth daily as needed for indigestion or heartburn.    [provider]  ?ferrous sulfate 325 (65 FE) MG EC tablet Take 1 tablet (325 mg total) by mouth 2 (two) times daily. ?Patient not taking: Reported on 06/13/2021 01/07/21 01/07/22  Kathie Dike, MD  ?fluconazole (DIFLUCAN) 150 MG tablet Take 1 tablet (150 mg total) by mouth daily. ?Patient not taking: Reported on 06/13/2021 05/10/21   Cleon Gustin, MD  ?primidone (MYSOLINE) 50 MG tablet Take 50 mg by mouth 2 (two) times daily. 04/17/21   [provider]  ?senna-docusate (SENOKOT-S) 8.6-50 MG tablet Take 2 tablets by mouth at bedtime as needed for moderate constipation. ?Patient not taking: Reported on 06/13/2021 11/29/20   Damita Lack, MD  ?   ? ?Allergies    ?Coumadin [warfarin], Atorvastatin, Ciprofloxacin, Macrobid [nitrofurantoin], and Sulfa antibiotics   ? ?Review of Systems   ?Review of Systems  ?Constitutional:  Negative for chills and fever.  ?HENT:  Negative for congestion and sore throat.   ?Eyes: Negative.   ?Respiratory:  Positive for cough and shortness of breath. Negative for chest tightness, wheezing and stridor.   ?Cardiovascular:  Positive for leg swelling. Negative for chest pain and palpitations.  ?Gastrointestinal:  Negative for abdominal pain and nausea.  ?Genitourinary: Negative.   ?Musculoskeletal:  Negative for arthralgias, joint swelling and neck pain.  ?Skin: Negative.  Negative for rash and wound.  ?Neurological:  Positive for headaches. Negative for dizziness, weakness, light-headedness and numbness.  ?Psychiatric/Behavioral: Negative.    ? ?Physical Exam ?Updated Vital Signs ?BP 119/65   Pulse 64   Temp 98 ?F (36.7 ?C) (Oral)   Resp 19   Ht '5\' 1"'$  (1.549 m)   Wt 72.6 kg   SpO2 100%    BMI 30.23 kg/m?  ?Physical Exam ?Vitals and nursing note reviewed.  ?Constitutional:   ?   Appearance: She is well-developed.  ?HENT:  ?   Head: Normocephalic and atraumatic.  ?Eyes:  ?   Conjunctiva/sclera: Conjunctivae normal.  ?Cardiovascular:  ?   Rate and Rhythm: Normal rate and regular rhythm.  ?   Heart sounds: Normal heart sounds.  ?Pulmonary:  ?   Effort: Pulmonary effort is normal.  ?   Breath sounds: Rhonchi present. No wheezing.  ?   Comments: Faint [place] crackles present. No wheezing or rales. ?Abdominal:  ?   General: Bowel sounds are normal.  ?   Palpations: Abdomen is soft.  ?   Tenderness: There is no abdominal tenderness.  ?Musculoskeletal:     ?   General: Normal range of motion.  ?   Cervical back: Normal range of motion.  ?   Right lower leg: No tenderness. Edema present.  ?  Left lower leg: No tenderness. Edema present.  ?   Comments: Bilateral ankle edema.   ?Skin: ?   General: Skin is warm and dry.  ?Neurological:  ?   Mental Status: She is alert.  ? ? ?ED Results / Procedures / Treatments   ?Labs ?(all labs ordered are listed, but only abnormal results are displayed) ?Labs Reviewed  ?BASIC METABOLIC PANEL - Abnormal; Notable for the following components:  ?    Result Value  ? Glucose, Bld 110 (*)   ? Creatinine, Ser 1.03 (*)   ? Calcium 8.7 (*)   ? GFR, Estimated 56 (*)   ? All other components within normal limits  ?CBC - Abnormal; Notable for the following components:  ? RBC 2.96 (*)   ? Hemoglobin 9.1 (*)   ? HCT 29.1 (*)   ? Platelets 435 (*)   ? All other components within normal limits  ?BRAIN NATRIURETIC PEPTIDE - Abnormal; Notable for the following components:  ? B Natriuretic Peptide 121.0 (*)   ? All other components within normal limits  ?URINALYSIS, ROUTINE W REFLEX MICROSCOPIC - Abnormal; Notable for the following components:  ? Color, Urine STRAW (*)   ? Leukocytes,Ua SMALL (*)   ? Bacteria, UA RARE (*)   ? All other components within normal limits  ?SEDIMENTATION RATE -  Abnormal; Notable for the following components:  ? Sed Rate 40 (*)   ? All other components within normal limits  ?RESP PANEL BY RT-PCR (FLU A&B, COVID) [id number]  ?URINE CULTURE  ?D-DIMER, QUANTITATIVE  ?LIPASE, BLO

## 2021-06-13 NOTE — H&P (Addendum)
?History and Physical  ? ? ?Patient: Monique Robles YBW:[id number] DOB: [birth year] ?DOA: 06/13/2021 ?DOS: the patient was seen and examined on 06/14/2021 ?PCP: Celene Squibb, MD  ?Patient coming from: Home ? ?Chief Complaint:  ?Chief Complaint  ?Patient presents with  ? Shortness of Breath  ? ?HPI: Monique Robles is a 81 y.o. female with medical history significant of  A. fib on Eliquis, chronic respiratory failure on supplemental oxygen at 2 LPM due to ILD.  Essential tremor, diastolic CHF, GERD, history of pulmonary embolism, history of stroke, hypertension, vitamin D deficiency, essential tremor presented to the ED with worsening shortness of breath which worsens with exertion. ?Patient states that she has been in and out of hospital in the last couple of months due to the shortness of breath, she complains of occasional nonproductive cough and that she could barely make it to the bathroom without needing to rest prior to being able to return to her room. She denies fever, chills, headache, nausea, vomiting, abdominal pain. ?She was admitted from 12/31/20 to 01/07/2021 due to recurrent A-fib with RVR in which she was initially restarted on home propranolol and was initially started on amiodarone infusion as well as Cardizem infusion which was subsequently held due to hypotension, but patient continued on amiodarone.  Patient also had pulmonary fibrosis considered to be related to nitrofurantoin ? ?ED Course:  ?In the emergency department, BP was 146/82, other vital signs were within normal range, O2 sat was 100% on 2 LPM.  Work-up in the ED showed normocytic anemia, thrombocytosis, BNP 121 (was 161 on 01/04/2021), D-dimer 0.39, normal BMP except for slightly elevated creatinine, sed rate 40, troponin x2 was flat at 4.  Influenza A, B, SARS coronavirus 2 was negative. ?CT head without contrast showed atrophy with small vessel chronic ischemic changes of deep cerebral ?white matter. ?CT abdomen and pelvis with contrast  showed no acute intra-abdominal pathology ?Chest x-ray showed cardiomegaly with diffuse, somewhat coarse appearing bilateral interstitial pulmonary opacity, consistent with fibrosis. No new or acute appearing airspace opacity. ?Breathing treatment with Solu-Medrol was started, pain was controlled with Norco, IV Lasix was given ? ?Review of Systems: ?Review of systems as noted in the HPI. All other systems reviewed and are negative. ? ? ?Past Medical History:  ?Diagnosis Date  ? (HFpEF) heart failure with preserved ejection fraction ([place])   ? a. 09/2020 Echo: EF 65-70%, no rwma, GrI DD, nl RV fxn, mildly dil LA. Mild MR. Mild AS; b. 12/2020 Echo: EF 55-60%. Nl RV fxn.  ? Aortic insufficiency   ? a. 09/2020 Echo: No significant AI.  ? Chest pain   ? a. 10/2020 MV: EF 68%, no ischemia/scar.  ? Chronic respiratory failure ([place])   ? DOE (dyspnea on exertion)   ? GERD (gastroesophageal reflux disease)   ? History of pneumonia   ? History of pulmonary embolism 2001  ? History of recurrent UTIs   ? History of stroke 2018  ? HTN (hypertension)   ? Hyperlipemia   ? Hypotension   ? a. On midodrine.  ? Mitral valve regurgitation   ? a. 09/2020 Echo: Mild MR.  ? Osteoporosis   ? PAF (paroxysmal atrial fibrillation) ([place])   ? a. CHA2DS2VASc = 7-->Eliquis; b. 12/2020 Amio started-->DCCV x 1; c. 01/2021 ED eval for AF-->DCCV x 1.  ? Tremor   ? Vitamin D deficiency   ? ?Past Surgical History:  ?Procedure Laterality Date  ? BALLOON DILATION N/A 11/28/2020  ? Procedure: BALLOON DILATION;  Surgeon: Rogene Houston, MD;  Location: [facility];  Service: Endoscopy;  Laterality: N/A;  ? BLADDER SURGERY    ? x 3  ? CARDIOVERSION N/A 01/04/2021  ? Procedure: CARDIOVERSION;  Surgeon: Arnoldo Lenis, MD;  Location: [facility] OR[facility];  Service: Endoscopy;  Laterality: N/A;  ? CERVICAL SPINE SURGERY    ? CHOLECYSTECTOMY    ? COLONOSCOPY    ? COLONOSCOPY WITH ESOPHAGOGASTRODUODENOSCOPY (EGD)    ? ESOPHAGOGASTRODUODENOSCOPY (EGD) WITH PROPOFOL N/A  11/28/2020  ? Procedure: ESOPHAGOGASTRODUODENOSCOPY (EGD) WITH PROPOFOL;  Surgeon: Rogene Houston, MD;  Location: [facility];  Service: Endoscopy;  Laterality: N/A;  ? LUMBAR SPINE SURGERY    ? REPLACEMENT TOTAL KNEE Left   ? RIGHT LUNG WEDGE REMOVAL    ? TONSILLECTOMY    ? ? ?Social History:  reports that she has never smoked. She has never used smokeless tobacco. She reports that she does not currently use alcohol. She reports that she does not use drugs. ? ? ?Allergies  ?Allergen Reactions  ? Coumadin [Warfarin] Hives  ? Atorvastatin Other (See Comments)  ?  Muscle aches  ? Ciprofloxacin   ? Macrobid [Nitrofurantoin]   ?  Cause respiratory flare  ? Sulfa Antibiotics Itching and Rash  ? ? ?Family History  ?Problem Relation Age of Onset  ? Alzheimer's disease Mother   ?     died at 65  ? Cancer Mother   ? Tremor Mother   ? Pneumonia Father   ? Heart disease Father   ? Diabetes Father   ? Heart attack Father   ?     died at 22  ?  ? ?Prior to Admission medications   ?Medication Sig Start Date End Date Taking? Authorizing Provider  ?acetaminophen (TYLENOL) 650 MG CR tablet Take 650 mg by mouth every 8 (eight) hours as needed for pain.   Yes [provider]  ?apixaban (ELIQUIS) 5 MG TABS tablet Take 5 mg by mouth 2 (two) times daily.   Yes [provider]  ?Cholecalciferol (VITAMIN D3) 1.25 MG (50000 UT) CAPS Take 1 capsule by mouth once a week. Monday at lunch 04/17/21  Yes [provider]  ?cyclobenzaprine (FLEXERIL) 5 MG tablet Take 5 mg by mouth daily as needed. 07/31/20  Yes [provider]  ?diphenhydrAMINE (BENADRYL) 25 MG tablet Take 25 mg by mouth daily as needed for allergies.   Yes [provider]  ?docusate sodium (COLACE) 100 MG capsule Take 1 capsule (100 mg total) by mouth 2 (two) times daily. 01/07/21  Yes Kathie Dike, MD  ?dronedarone (MULTAQ) 400 MG tablet Take 1 tablet (400 mg total) by mouth 2 (two) times daily with a meal. ?Patient taking  differently: Take 400 mg by mouth See admin instructions. Take 1 tablet in the morning and 1 tablet at 6-630 pm with meals 03/26/21  Yes Evans Lance, MD  ?furosemide (LASIX) 20 MG tablet Take 2 tablets (40 mg total) by mouth daily. 01/07/21  Yes Kathie Dike, MD  ?gabapentin (NEURONTIN) 100 MG capsule Take 200 mg by mouth See admin instructions. Take 200 mg in the morning and mid day   Yes [provider]  ?HYDROcodone-acetaminophen (NORCO/VICODIN) 5-325 MG tablet Take 1 tablet by mouth 2 (two) times daily as needed for moderate pain. 11/29/20  Yes Amin, Jeanella Flattery, MD  ?levalbuterol (XOPENEX) 1.25 MG/0.5ML nebulizer solution Take 1.25 mg by nebulization every 6 (six) hours as needed for wheezing or shortness of breath. 11/29/20  Yes  Amin, Jeanella Flattery, MD  ?Melatonin 10 MG TABS Take 10 mg by mouth at bedtime.   Yes [provider]  ?midodrine (PROAMATINE) 5 MG tablet Take 1 tablet (5 mg total) by mouth 2 (two) times daily with a meal. 01/29/21  Yes Evans Lance, MD  ?Multiple Vitamin (MULTIVITAMIN) tablet Take 1 tablet by mouth daily.   Yes [provider]  ?omeprazole (PRILOSEC) 40 MG capsule Take 1 capsule by mouth daily. 06/21/20  Yes [provider]  ?ondansetron (ZOFRAN) 4 MG tablet Take 4 mg by mouth every 8 (eight) hours as needed. 04/17/21  Yes [provider]  ?sucralfate (CARAFATE) 1 g tablet Take 1 g by mouth at bedtime.   Yes [provider]  ?trimethoprim (TRIMPEX) 100 MG tablet Take 1 tablet (100 mg total) by mouth at bedtime. 05/10/21  Yes McKenzie, Candee Furbish, MD  ?Vibegron (GEMTESA) 75 MG TABS Take 1 capsule by mouth daily. 05/10/21  Yes McKenzie, Candee Furbish, MD  ?amoxicillin-clavulanate (AUGMENTIN) 875-125 MG tablet Take 1 tablet by mouth every 12 (twelve) hours. ?Patient not taking: Reported on 06/13/2021 05/10/21   Cleon Gustin, MD  ?calcium carbonate (TUMS - DOSED IN MG ELEMENTAL CALCIUM) 500 MG chewable tablet Chew 1 tablet by mouth  daily as needed for indigestion or heartburn.    [provider]  ?ferrous sulfate 325 (65 FE) MG EC tablet Take 1 tablet (325 mg total) by mouth 2 (two) times daily. ?Patient not taking: Reported on 06/13/2021 10/31/2

## 2021-06-14 DIAGNOSIS — D75839 Thrombocytosis, unspecified: Secondary | ICD-10-CM | POA: Diagnosis present

## 2021-06-14 DIAGNOSIS — R7989 Other specified abnormal findings of blood chemistry: Secondary | ICD-10-CM | POA: Diagnosis present

## 2021-06-14 DIAGNOSIS — I959 Hypotension, unspecified: Secondary | ICD-10-CM | POA: Diagnosis present

## 2021-06-14 DIAGNOSIS — J8410 Pulmonary fibrosis, unspecified: Secondary | ICD-10-CM | POA: Diagnosis present

## 2021-06-14 DIAGNOSIS — D649 Anemia, unspecified: Secondary | ICD-10-CM | POA: Diagnosis present

## 2021-06-14 DIAGNOSIS — J9621 Acute and chronic respiratory failure with hypoxia: Secondary | ICD-10-CM | POA: Diagnosis present

## 2021-06-14 DIAGNOSIS — Z20822 Contact with and (suspected) exposure to covid-19: Secondary | ICD-10-CM | POA: Diagnosis present

## 2021-06-14 DIAGNOSIS — E559 Vitamin D deficiency, unspecified: Secondary | ICD-10-CM | POA: Diagnosis present

## 2021-06-14 DIAGNOSIS — I110 Hypertensive heart disease with heart failure: Secondary | ICD-10-CM | POA: Diagnosis present

## 2021-06-14 DIAGNOSIS — I480 Paroxysmal atrial fibrillation: Secondary | ICD-10-CM | POA: Diagnosis present

## 2021-06-14 DIAGNOSIS — G250 Essential tremor: Secondary | ICD-10-CM | POA: Diagnosis present

## 2021-06-14 DIAGNOSIS — K219 Gastro-esophageal reflux disease without esophagitis: Secondary | ICD-10-CM | POA: Diagnosis present

## 2021-06-14 DIAGNOSIS — X58XXXA Exposure to other specified factors, initial encounter: Secondary | ICD-10-CM | POA: Diagnosis present

## 2021-06-14 DIAGNOSIS — Z79890 Hormone replacement therapy (postmenopausal): Secondary | ICD-10-CM | POA: Diagnosis not present

## 2021-06-14 DIAGNOSIS — N3281 Overactive bladder: Secondary | ICD-10-CM | POA: Diagnosis present

## 2021-06-14 DIAGNOSIS — T378X5A Adverse effect of other specified systemic anti-infectives and antiparasitics, initial encounter: Secondary | ICD-10-CM | POA: Diagnosis present

## 2021-06-14 DIAGNOSIS — I5032 Chronic diastolic (congestive) heart failure: Secondary | ICD-10-CM | POA: Diagnosis present

## 2021-06-14 DIAGNOSIS — J704 Drug-induced interstitial lung disorders, unspecified: Secondary | ICD-10-CM | POA: Diagnosis present

## 2021-06-14 DIAGNOSIS — I482 Chronic atrial fibrillation, unspecified: Secondary | ICD-10-CM | POA: Diagnosis present

## 2021-06-14 DIAGNOSIS — Z86711 Personal history of pulmonary embolism: Secondary | ICD-10-CM | POA: Diagnosis not present

## 2021-06-14 DIAGNOSIS — G4700 Insomnia, unspecified: Secondary | ICD-10-CM | POA: Diagnosis present

## 2021-06-14 DIAGNOSIS — Z8673 Personal history of transient ischemic attack (TIA), and cerebral infarction without residual deficits: Secondary | ICD-10-CM | POA: Diagnosis not present

## 2021-06-14 DIAGNOSIS — Z66 Do not resuscitate: Secondary | ICD-10-CM | POA: Diagnosis present

## 2021-06-14 DIAGNOSIS — Z881 Allergy status to other antibiotic agents status: Secondary | ICD-10-CM | POA: Diagnosis not present

## 2021-06-14 DIAGNOSIS — Z888 Allergy status to other drugs, medicaments and biological substances status: Secondary | ICD-10-CM | POA: Diagnosis not present

## 2021-06-14 DIAGNOSIS — Z7901 Long term (current) use of anticoagulants: Secondary | ICD-10-CM | POA: Diagnosis not present

## 2021-06-14 LAB — COMPREHENSIVE METABOLIC PANEL
ALT: 13 U/L (ref 0–44)
AST: 24 U/L (ref 15–41)
Albumin: 3.6 g/dL (ref 3.5–5.0)
Alkaline Phosphatase: 70 U/L (ref 38–126)
Anion gap: 10 (ref 5–15)
BUN: 19 mg/dL (ref 8–23)
CO2: 23 mmol/L (ref 22–32)
Calcium: 8.6 mg/dL — ABNORMAL LOW (ref 8.9–10.3)
Chloride: 103 mmol/L (ref 98–111)
Creatinine, Ser: 1 mg/dL (ref 0.44–1.00)
GFR, Estimated: 58 mL/min — ABNORMAL LOW (ref >60)
Glucose, Bld: 153 mg/dL — ABNORMAL HIGH (ref 70–99)
Potassium: 3.8 mmol/L (ref 3.5–5.1)
Sodium: 136 mmol/L (ref 135–145)
Total Bilirubin: 0.3 mg/dL (ref 0.3–1.2)
Total Protein: 6.8 g/dL (ref 6.5–8.1)

## 2021-06-14 LAB — PHOSPHORUS: Phosphorus: 3.4 mg/dL (ref 2.5–4.6)

## 2021-06-14 LAB — CBC
HCT: 27.5 % — ABNORMAL LOW (ref 36.0–46.0)
Hemoglobin: 8.7 g/dL — ABNORMAL LOW (ref 12.0–15.0)
MCH: 30.6 pg (ref 26.0–34.0)
MCHC: 31.6 g/dL (ref 30.0–36.0)
MCV: 96.8 fL (ref 80.0–100.0)
Platelets: 404 10*3/uL — ABNORMAL HIGH (ref 150–400)
RBC: 2.84 MIL/uL — ABNORMAL LOW (ref 3.87–5.11)
RDW: 12.6 % (ref 11.5–15.5)
WBC: 4.6 10*3/uL (ref 4.0–10.5)
nRBC: 0 % (ref 0.0–0.2)

## 2021-06-14 LAB — MAGNESIUM: Magnesium: 2.1 mg/dL (ref 1.7–2.4)

## 2021-06-14 MED ORDER — VIBEGRON 75 MG PO TABS
1.0000 | ORAL_TABLET | Freq: Every day | ORAL | Status: DC
  Filled 2021-06-14 (×3): qty 1

## 2021-06-14 MED ORDER — VIBEGRON 75 MG PO TABS
1.0000 | ORAL_TABLET | Freq: Every day | ORAL | Status: DC
  Administered 2021-06-14: 1 via ORAL
  Filled 2021-06-14: qty 1

## 2021-06-14 MED ORDER — APIXABAN 5 MG PO TABS
5.0000 mg | ORAL_TABLET | Freq: Two times a day (BID) | ORAL | Status: DC
  Administered 2021-06-14 – 2021-06-15 (×4): 5 mg via ORAL
  Filled 2021-06-14 (×5): qty 1

## 2021-06-14 MED ORDER — ACETAMINOPHEN 325 MG PO TABS
ORAL_TABLET | ORAL | Status: AC
  Filled 2021-06-14: qty 2

## 2021-06-14 MED ORDER — MELATONIN 3 MG PO TABS
9.0000 mg | ORAL_TABLET | Freq: Every day | ORAL | Status: DC
  Administered 2021-06-14 (×2): 9 mg via ORAL
  Filled 2021-06-14 (×2): qty 3

## 2021-06-14 MED ORDER — MIDODRINE HCL 5 MG PO TABS
5.0000 mg | ORAL_TABLET | Freq: Two times a day (BID) | ORAL | Status: DC
  Administered 2021-06-14 – 2021-06-15 (×3): 5 mg via ORAL
  Filled 2021-06-14 (×3): qty 1

## 2021-06-14 MED ORDER — DRONEDARONE HCL 400 MG PO TABS
400.0000 mg | ORAL_TABLET | Freq: Two times a day (BID) | ORAL | Status: DC
  Administered 2021-06-14 – 2021-06-15 (×3): 400 mg via ORAL
  Filled 2021-06-14 (×7): qty 1

## 2021-06-14 MED ORDER — DRONEDARONE HCL 400 MG PO TABS: 400.0000 mg | ORAL_TABLET | ORAL | Status: DC

## 2021-06-14 MED ORDER — IPRATROPIUM-ALBUTEROL 0.5-2.5 (3) MG/3ML IN SOLN: 3.0000 mL | RESPIRATORY_TRACT | Status: DC | PRN

## 2021-06-14 MED ORDER — METHYLPREDNISOLONE SODIUM SUCC 40 MG IJ SOLR
40.0000 mg | Freq: Two times a day (BID) | INTRAMUSCULAR | Status: DC
  Administered 2021-06-14 – 2021-06-15 (×2): 40 mg via INTRAVENOUS
  Filled 2021-06-14 (×2): qty 1

## 2021-06-14 MED ORDER — PANTOPRAZOLE SODIUM 40 MG PO TBEC
40.0000 mg | DELAYED_RELEASE_TABLET | Freq: Every day | ORAL | Status: DC
  Administered 2021-06-14 – 2021-06-15 (×2): 40 mg via ORAL
  Filled 2021-06-14 (×3): qty 1

## 2021-06-14 MED ORDER — ACETAMINOPHEN 325 MG PO TABS
650.0000 mg | ORAL_TABLET | Freq: Once | ORAL | Status: AC
  Administered 2021-06-14: 650 mg via ORAL

## 2021-06-14 MED ORDER — ACETAMINOPHEN 325 MG PO TABS
650.0000 mg | ORAL_TABLET | Freq: Once | ORAL | Status: AC
  Administered 2021-06-14: 650 mg via ORAL
  Filled 2021-06-14: qty 2

## 2021-06-14 MED ORDER — LEVALBUTEROL HCL 1.25 MG/0.5ML IN NEBU
1.2500 mg | INHALATION_SOLUTION | Freq: Four times a day (QID) | RESPIRATORY_TRACT | Status: DC | PRN
  Administered 2021-06-14: 1.25 mg via RESPIRATORY_TRACT
  Filled 2021-06-14: qty 0.5

## 2021-06-14 NOTE — Progress Notes (Signed)
?PROGRESS NOTE ? ? ? ?Monique Robles  IBB:[id number] DOB: 12-14-1940 DOA: 06/13/2021 ?PCP: Celene Squibb, MD ? ? ?Brief Narrative:  ?Per HPI: ?Monique Robles is a 81 y.o. female with medical history significant of  A. fib on Eliquis, chronic respiratory failure on supplemental oxygen at 2 LPM due to ILD.  Essential tremor, diastolic CHF, GERD, history of pulmonary embolism, history of stroke, hypertension, vitamin D deficiency, essential tremor presented to the ED with worsening shortness of breath which worsens with exertion. ?Patient states that she has been in and out of hospital in the last couple of months due to the shortness of breath, she complains of occasional nonproductive cough and that she could barely make it to the bathroom without needing to rest prior to being able to return to her room. She denies fever, chills, headache, nausea, vomiting, abdominal pain. ?She was admitted from 12/31/20 to 01/07/2021 due to recurrent A-fib with RVR in which she was initially restarted on home propranolol and was initially started on amiodarone infusion as well as Cardizem infusion which was subsequently held due to hypotension, but patient continued on amiodarone.  Patient also had pulmonary fibrosis considered to be related to nitrofurantoin. ? ?-Patient has been admitted with acute on chronic hypoxemic respiratory failure secondary to pulmonary fibrosis in the setting of prior nitrofurantoin use.  She has been started on IV Solu-Medrol.    ? ? ?Assessment & Plan: ?  ?Principal Problem: ?  Acute on chronic respiratory failure with hypoxia ([place]) ?Active Problems: ?  Essential tremor ?  Overactive bladder ?  GERD (gastroesophageal reflux disease) ?  Insomnia ?  Drug-induced pneumonitis ?  Atrial fibrillation, chronic ([place]) ?  Elevated brain natriuretic peptide (BNP) level ?  Thrombocytosis ?  History of pulmonary embolism ? ?Assessment and Plan: ? ?Acute on chronic respiratory failure with hypoxia possibly secondary to  pulmonary fibrosis related to nitrofurantoin ?Continue Xopenex, Solu-Medrol ?Continue Protonix to prevent steroid-induced ulcer ?Continue incentive spirometry and flutter valve ?Pulmonology consult appreciated, plans to ambulate today and if doing better by a.m., anticipate 2-week prednisone taper ?Continue supplemental oxygen to maintain O2 sat and wears 2 L nasal cannula at home ?  ?Thrombocytosis possibly reactive ?Platelets 435, continue to monitor platelets level with morning labs ?  ?Essential tremor ?Stable; propranolol was held due to hypotension ?  ?History of pulmonary embolism ?Continue Eliquis ?  ?Chronic atrial fibrillation ?Continue Multaq, Eliquis ?  ?Overactive bladder ?Continue Vibegron ?  ?GERD ?Continue Protonix ? ?Diastolic CHF ?Elevated BNP ?BNP 121 (was 161 on 01/04/2021) ?Echocardiogram done on 01/03/2021 showed LVEF of 55 to 60%.  LV diastolic parameters are indeterminate ?Continue total input/output, daily weights and fluid restriction ?Continue Cardiac diet             ?  ?Essential hypertension ?BP meds held at this time due to soft BP ?Continue midodrine ?  ?Insomnia ?Continue melatonin ? ?  ?DVT prophylaxis: Eliquis ?Code Status: DNR ?Family Communication: None at bedside ?Disposition Plan:  ?Status is: Observation ?The patient will require care spanning > 2 midnights and should be moved to inpatient because: Need for IV steroids. ? ?Consultants:  ?Pulmonology ? ?Procedures:  ?None ? ?Antimicrobials:  ?None ? ? ?Subjective: ?Patient seen and evaluated today with no new acute complaints or concerns. No acute concerns or events noted overnight.  She is on 2 L nasal cannula which is her usual baseline.  She denies any chest tightness or coughing. ? ?Objective: ?Vitals:  ? 06/14/21 0630 06/14/21 0700  06/14/21 0730 06/14/21 0840  ?BP: 107/61 107/62 118/86 125/71  ?Pulse: 64 61 60 75  ?Resp: '18 18 17 '$ (!) 22  ?Temp:      ?TempSrc:      ?SpO2: 99% 100% 100% 96%  ?Weight:      ?Height:       ? ? ?Intake/Output Summary (Last 24 hours) at 06/14/2021 0926 ?Last data filed at 06/14/2021 0201 ?Gross per 24 hour  ?Intake 100 ml  ?Output 1100 ml  ?Net -1000 ml  ? ?Filed Weights  ? 06/13/21 1441  ?Weight: 72.6 kg  ? ? ?Examination: ? ?General exam: Appears calm and comfortable  ?Respiratory system: Clear to auscultation. Respiratory effort normal.  Currently on 2 L nasal cannula. ?Cardiovascular system: S1 & S2 heard, RRR.  ?Gastrointestinal system: Abdomen is soft ?Central nervous system: Alert and awake ?Extremities: No edema ?Skin: No significant lesions noted ?Psychiatry: Flat affect. ? ? ? ?Data Reviewed: I have personally reviewed following labs and imaging studies ? ?CBC: ?Recent Labs  ?Lab 06/13/21 ?1425 06/14/21 ?0342  ?WBC 6.9 4.6  ?HGB 9.1* 8.7*  ?HCT 29.1* 27.5*  ?MCV 98.3 96.8  ?PLT 435* 404*  ? ?Basic Metabolic Panel: ?Recent Labs  ?Lab 06/13/21 ?1425 06/14/21 ?0342  ?NA 138 136  ?K 3.9 3.8  ?CL 108 103  ?CO2 23 23  ?GLUCOSE 110* 153*  ?BUN 21 19  ?CREATININE 1.03* 1.00  ?CALCIUM 8.7* 8.6*  ?MG  --  2.1  ?PHOS  --  3.4  ? ?GFR: ?Estimated Creatinine Clearance: 42.2 mL/min (by C-G formula based on SCr of 1 mg/dL). ?Liver Function Tests: ?Recent Labs  ?Lab 06/13/21 ?1559 06/14/21 ?0342  ?AST 24 24  ?ALT 13 13  ?ALKPHOS 74 70  ?BILITOT 0.6 0.3  ?PROT 7.2 6.8  ?ALBUMIN 3.9 3.6  ? ?Recent Labs  ?Lab 06/13/21 ?1559  ?LIPASE 43  ? ?No results for input(s): AMMONIA in the last 168 hours. ?Coagulation Profile: ?No results for input(s): INR, PROTIME in the last 168 hours. ?Cardiac Enzymes: ?No results for input(s): CKTOTAL, CKMB, CKMBINDEX, TROPONINI in the last 168 hours. ?BNP (last 3 results) ?Recent Labs  ?  12/05/20 ?1636  ?PROBNP 277.0*  ? ?HbA1C: ?No results for input(s): HGBA1C in the last 72 hours. ?CBG: ?No results for input(s): GLUCAP in the last 168 hours. ?Lipid Profile: ?No results for input(s): CHOL, HDL, LDLCALC, TRIG, CHOLHDL, LDLDIRECT in the last 72 hours. ?Thyroid Function Tests: ?No results  for input(s): TSH, T4TOTAL, FREET4, T3FREE, THYROIDAB in the last 72 hours. ?Anemia Panel: ?No results for input(s): VITAMINB12, FOLATE, FERRITIN, TIBC, IRON, RETICCTPCT in the last 72 hours. ?Sepsis Labs: ?No results for input(s): PROCALCITON, LATICACIDVEN in the last 168 hours. ? ?Recent Results (from the past 240 hour(s))  ?Resp Panel by RT-PCR (Flu A&B, Covid) Nasopharyngeal Swab     Status: None  ? Collection Time: 06/13/21  3:43 PM  ? Specimen: Nasopharyngeal Swab; Nasopharyngeal(NP) swabs in vial transport medium  ?Result Value Ref Range Status  ? SARS Coronavirus 2 by RT PCR NEGATIVE NEGATIVE Final  ?  Comment: (NOTE) ?SARS-CoV-2 target nucleic acids are NOT DETECTED. ? ?The SARS-CoV-2 RNA is generally detectable in upper respiratory ?specimens during the acute phase of infection. The lowest ?concentration of SARS-CoV-2 viral copies this assay can detect is ?138 copies/mL. A negative result does not preclude SARS-Cov-2 ?infection and should not be used as the sole basis for treatment or ?other patient management decisions. A negative result may occur with  ?improper specimen collection/handling,  submission of specimen other ?than nasopharyngeal swab, presence of viral mutation(s) within the ?areas targeted by this assay, and inadequate number of viral ?copies(<138 copies/mL). A negative result must be combined with ?clinical observations, patient history, and epidemiological ?information. The expected result is Negative. ? ?Fact Sheet for Patients:  ?EntrepreneurPulse.com.au ? ?Fact Sheet for Healthcare Providers:  ?IncredibleEmployment.be ? ?This test is no t yet approved or cleared by the [place] FDA and  ?has been authorized for detection and/or diagnosis of SARS-CoV-2 by ?FDA under an Emergency Use Authorization (EUA). This EUA will remain  ?in effect (meaning this test can be used) for the duration of the ?COVID-19 declaration under Section 564(b)(1) of the Act,  21 ?U.S.C.section 360bbb-3(b)(1), unless the authorization is terminated  ?or revoked sooner.  ? ? ?  ? Influenza A by PCR NEGATIVE NEGATIVE Final  ? Influenza B by PCR NEGATIVE NEGATIVE Final  ?  Comment: (NO

## 2021-06-14 NOTE — TOC Progression Note (Signed)
?  Transition of Care (TOC) Screening Note ? ? ?Patient Details  ?Name: Monique Robles ?Date of Birth: 1940/09/06 ? ? ?Transition of Care (TOC) CM/SW Contact:    ?Boneta Lucks, RN ?Phone Number: ?06/14/2021, 12:03 PM ? ? ? ?Transition of Care Department [facility]) has reviewed patient and no TOC needs have been identified at this time. We will continue to monitor patient advancement through interdisciplinary progression rounds. If new patient transition needs arise, please place a TOC consult. ? ? ? ?  ?Barriers to Discharge: Continued Medical Work up ? ?

## 2021-06-14 NOTE — Care Management Obs Status (Signed)
MEDICARE OBSERVATION STATUS NOTIFICATION ? ? ?Patient Details  ?Name: Monique Robles ?MRN: [id number] ?Date of Birth: 29-Nov-1940 ? ? ?Medicare Observation Status Notification Given:  Yes ? ? ? ?Iona Beard, LCSWA ?06/14/2021, 9:02 AM ?

## 2021-06-14 NOTE — Consult Note (Signed)
? ?NAME:  Monique Robles, MRN:  [id number], DOB:  October 11, 1940, LOS: 0 ?ADMISSION DATE:  06/13/2021, CONSULTATION DATE:  06/13/21 ?REFERRING MD:  [facility]/ EDP , CHIEF COMPLAINT:  dyspnea   ? 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 Of amiodarone toxicity / 02 dep at that point 2lpm rest, 4 lpm walking at NH  now seen in ER during w/u for uneplained progressive doe, not at rest assoc with dry cough and non specific abd pain and swelling s cp or leg swelling  ?  ?  ?  ? ?Interim History / Subjective:  ?Lying flat for Abd CT on my arrival with no dyspnea  ? ?Objective   ?Blood pressure 110/66, pulse 64, temperature 98 ?F (36.7 ?C), temperature source Oral, resp. rate 17, height '5\' 1"'  (1.549 m), weight 72.6 kg, SpO2 99 %. ?   ?   ? ?Intake/Output Summary (Last 24 hours) at 06/14/2021 0438 ?Last data filed at 06/14/2021 0201 ?Gross per 24 hour  ?Intake 100 ml  ?Output 1100 ml  ?Net -1000 ml  ? ?Filed Weights  ? 06/13/21 1441  ?Weight: 72.6 kg  ? ? ?Tmax:  98 ?General appearance:    elderly wf lying flat on strecher nad   ?At Rest 02 sats  99% on 3lpm   ?No jvd ?Oropharynx clear,  mucosa nl ?Neck supple ?Lungs with a few scattered exp > insp rhonchi bilaterally ?RRR no s3 or or sign murmur ?Abd mod distended , limited  excursion no guarding or rebound ?Extr warm with no edema or clubbing noted ?Neuro  Sensorium intact,  no apparent motor deficits  ? ? ?I personally reviewed images and agree with radiology impression as follows:  ?CXR:   portable 4/6 pm ? Cardiomegaly with diffuse, somewhat coarse appearing bilateral ?interstitial pulmonary opacity, consistent with fibrosis. No new or ?acute appearing airspace opacity. ? ?BNP  4/6  121 ? ?D Dimer 0.39  (on Eliquis)  ?  ?Lab Results  ?Component Value Date  ? ESRSEDRATE 40 (H) 06/13/2021  ? ESRSEDRATE 60 (H) 01/04/2021  ? ESRSEDRATE 19 12/05/2020  ?   ? ? ? ? 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  ?>>> this is a tough call but for now I would hold off additional steroids based on problems 2 and 3 below and probably should admit for Triad w/u but if decline in sats would start back on Solumedrol 40 mg IV q 12 h over the weekend and our team can see her 1st of week.  ? ?Comment re amiodarone: ?Risk factors for pulmonary toxicity include age greater than 65, daily dose greater than equal to 400 mg, a high cumulative dose, or pre-existing lung disease   (2/4 risk factors)  ?  ?2) Unexplained abd pain/ distention new problem ? Correlate wit breathing issues but CT Abd is benign. ?- note it wouldn't take much abd distention to cause significant dyspnea given her poor baseline functional status. ?>>> w/u per EDP in progress.  ? ? ?3) Chronic anemia may also may be contributing to DOE ?Lab Results  ?Component Value Date  ? HGB 8.7 (  L) 06/14/2021  ? HGB 9.1 (L) 06/13/2021  ? HGB 11.1 (L) 01/16/2021  ?>>> defer w/u to EDP ? ?Discussed with daughter in ER prior to CT abd  ?Labs   ?CBC: ?Recent Labs  ?Lab 06/13/21 ?1425 06/14/21 ?0342  ?WBC 6.9 4.6  ?HGB 9.1* 8.7*  ?HCT 29.1* 27.5*  ?MCV 98.3 96.8  ?PLT 435* 404*  ? ? ?Basic Metabolic Panel: ?Recent Labs  ?Lab 06/13/21 ?1425  ?NA 138  ?K 3.9  ?CL 108  ?CO2 23  ?GLUCOSE 110*  ?BUN 21  ?CREATININE 1.03*  ?CALCIUM 8.7*  ? ?GFR: ?Estimated Creatinine Clearance: 41 mL/min (A) (by C-G formula based on SCr of 1.03 mg/dL (H)). ?Recent Labs  ?Lab 06/13/21 ?1425 06/14/21 ?0342  ?WBC 6.9 4.6  ? ? ?Liver Function Tests: ?Recent Labs  ?Lab 06/13/21 ?1559  ?AST 24  ?ALT 13  ?ALKPHOS 74  ?BILITOT 0.6  ?PROT 7.2  ?ALBUMIN 3.9  ? ?Recent Labs  ?Lab  06/13/21 ?1559  ?LIPASE 43  ? ?No results for input(s): AMMONIA in the last 168 hours. ? ?ABG ?   ?Component Value Date/Time  ? PHART 7.452 (H) 11/24/2020 3295  ? PCO2ART 36.3 11/24/2020 0905  ? PO2ART 57.6 (L) 11/24/2020 0905  ? HCO3 24.0 12/31/2020 1325  ? ACIDBASEDEF 3.2 (H) 11/23/2020 0945  ? O2SAT 45.1 12/31/2020 1325  ?  ? ?Coagulation Profile: ?No results for input(s): INR, PROTIME in the last 168 hours. ? ?Cardiac Enzymes: ?No results for input(s): CKTOTAL, CKMB, CKMBINDEX, TROPONINI in the last 168 hours. ? ?HbA1C: ?No results found for: HGBA1C ? ?CBG: ?No results for input(s): GLUCAP in the last 168 hours. ? ?  ? ?Past Medical History:  ?She,  has a past medical history of (HFpEF) heart failure with preserved ejection fraction [facility]), Aortic insufficiency, Chest pain, Chronic respiratory failure ([place]), DOE (dyspnea on exertion), GERD (gastroesophageal reflux disease), History of pneumonia, History of pulmonary embolism (2001), History of recurrent UTIs, History of stroke (2018), HTN (hypertension), Hyperlipemia, Hypotension, Mitral valve regurgitation, Osteoporosis, PAF (paroxysmal atrial fibrillation) ([place]), Tremor, and Vitamin D deficiency.  ? ?Surgical History:  ? ?Past Surgical History:  ?Procedure Laterality Date  ? BALLOON DILATION N/A 11/28/2020  ? Procedure: BALLOON DILATION;  Surgeon: Rogene Houston, MD;  Location: [facility];  Service: Endoscopy;  Laterality: N/A;  ? BLADDER SURGERY    ? x 3  ? CARDIOVERSION N/A 01/04/2021  ? Procedure: CARDIOVERSION;  Surgeon: Arnoldo Lenis, MD;  Location: [facility] ORS;  Service: Endoscopy;  Laterality: N/A;  ? CERVICAL SPINE SURGERY    ? CHOLECYSTECTOMY    ? COLONOSCOPY    ? COLONOSCOPY WITH ESOPHAGOGASTRODUODENOSCOPY (EGD)    ? ESOPHAGOGASTRODUODENOSCOPY (EGD) WITH PROPOFOL N/A 11/28/2020  ? Procedure: ESOPHAGOGASTRODUODENOSCOPY (EGD) WITH PROPOFOL;  Surgeon: Rogene Houston, MD;  Location: [facility];  Service: Endoscopy;  Laterality: N/A;  ? LUMBAR  SPINE SURGERY    ? REPLACEMENT TOTAL KNEE Left   ? RIGHT LUNG WEDGE REMOVAL    ? TONSILLECTOMY    ?  ? ?Social History:  ? reports that she has never smoked. She has never used smokeless tobacco. She reports that she does not currently use alcohol. She reports that she does not use drugs.  ? ?Family History:  ?Her family history includes Alzheimer's disease in her mother; Cancer in her mother; Diabetes in her father; Heart attack in her father; Heart disease in her father; Pneumonia in her father; Tremor in her mother.  ? ?Allergies ?Allergies  ?  Allergen Reactions  ? Coumadin [Warfarin] Hives  ? Atorvastatin Other (See Comments)  ?  Muscle aches  ? Ciprofloxacin   ? Macrobid [Nitrofurantoin]   ?  Cause respiratory flare  ? Sulfa Antibiotics Itching and Rash  ?  ? ?Home Medications  ?Prior to Admission medications   ?Medication Sig Start Date End Date Taking? Authorizing Provider  ?acetaminophen (TYLENOL) 650 MG CR tablet Take 650 mg by mouth every 8 (eight) hours as needed for pain.   Yes [provider]  ?apixaban (ELIQUIS) 5 MG TABS tablet Take 5 mg by mouth 2 (two) times daily.   Yes [provider]  ?Cholecalciferol (VITAMIN D3) 1.25 MG (50000 UT) CAPS Take 1 capsule by mouth once a week. Monday at lunch 04/17/21  Yes [provider]  ?cyclobenzaprine (FLEXERIL) 5 MG tablet Take 5 mg by mouth daily as needed. 07/31/20  Yes [provider]  ?diphenhydrAMINE (BENADRYL) 25 MG tablet Take 25 mg by mouth daily as needed for allergies.   Yes [provider]  ?docusate sodium (COLACE) 100 MG capsule Take 1 capsule (100 mg total) by mouth 2 (two) times daily. 01/07/21  Yes Kathie Dike, MD  ?dronedarone (MULTAQ) 400 MG tablet Take 1 tablet (400 mg total) by mouth 2 (two) times daily with a meal. ?Patient taking differently: Take 400 mg by mouth See admin instructions. Take 1 tablet in the morning and 1 tablet at 6-630 pm with meals 03/26/21  Yes Evans Lance, MD   ?furosemide (LASIX) 20 MG tablet Take 2 tablets (40 mg total) by mouth daily. 01/07/21  Yes Kathie Dike, MD  ?gabapentin (NEURONTIN) 100 MG capsule Take 200 mg by mouth See admin instructions. Take 200 mg in the mornin

## 2021-06-14 NOTE — Progress Notes (Signed)
Patient states the neb that was given earlier has made  her very jittery. She does not wish to have another. He lungs appear diminished , saturation is 98 on 3 liters. ?

## 2021-06-14 NOTE — ED Notes (Signed)
Pt ambulated in hallway with walker, steady gait, O2 at 2L. Pt ambulated approximately 20-25 feet and reported feeling weak. O2 saturation 92%, respiratory rate-30. Pt ambulated back to room with walker and O2 at 2L, tachypnea noted. Pt O2 saturation 87%, heart rate-116, respiratory rate-30. Pt returned to stretcher, O2 saturation returned to 100%.   ?

## 2021-06-14 NOTE — Hospital Course (Signed)
Per HPI: ?Monique Robles is a 81 y.o. female with medical history significant of  A. fib on Eliquis, chronic respiratory failure on supplemental oxygen at 2 LPM due to ILD.  Essential tremor, diastolic CHF, GERD, history of pulmonary embolism, history of stroke, hypertension, vitamin D deficiency, essential tremor presented to the ED with worsening shortness of breath which worsens with exertion. ?Patient states that she has been in and out of hospital in the last couple of months due to the shortness of breath, she complains of occasional nonproductive cough and that she could barely make it to the bathroom without needing to rest prior to being able to return to her room. She denies fever, chills, headache, nausea, vomiting, abdominal pain. ?She was admitted from 12/31/20 to 01/07/2021 due to recurrent A-fib with RVR in which she was initially restarted on home propranolol and was initially started on amiodarone infusion as well as Cardizem infusion which was subsequently held due to hypotension, but patient continued on amiodarone.  Patient also had pulmonary fibrosis considered to be related to nitrofurantoin. ? ?-Patient has been admitted with acute on chronic hypoxemic respiratory failure secondary to pulmonary fibrosis in the setting of prior nitrofurantoin use.  She has been started on IV Solu-Medrol.   ?

## 2021-06-14 NOTE — Plan of Care (Signed)

## 2021-06-15 LAB — CBC
HCT: 27.9 % — ABNORMAL LOW (ref 36.0–46.0)
Hemoglobin: 8.7 g/dL — ABNORMAL LOW (ref 12.0–15.0)
MCH: 30.3 pg (ref 26.0–34.0)
MCHC: 31.2 g/dL (ref 30.0–36.0)
MCV: 97.2 fL (ref 80.0–100.0)
Platelets: 443 10*3/uL — ABNORMAL HIGH (ref 150–400)
RBC: 2.87 MIL/uL — ABNORMAL LOW (ref 3.87–5.11)
RDW: 12.6 % (ref 11.5–15.5)
WBC: 8.8 10*3/uL (ref 4.0–10.5)
nRBC: 0 % (ref 0.0–0.2)

## 2021-06-15 LAB — BASIC METABOLIC PANEL
Anion gap: 10 (ref 5–15)
BUN: 26 mg/dL — ABNORMAL HIGH (ref 8–23)
CO2: 25 mmol/L (ref 22–32)
Calcium: 9.3 mg/dL (ref 8.9–10.3)
Chloride: 104 mmol/L (ref 98–111)
Creatinine, Ser: 1.04 mg/dL — ABNORMAL HIGH (ref 0.44–1.00)
GFR, Estimated: 55 mL/min — ABNORMAL LOW (ref >60)
Glucose, Bld: 111 mg/dL — ABNORMAL HIGH (ref 70–99)
Potassium: 4.1 mmol/L (ref 3.5–5.1)
Sodium: 139 mmol/L (ref 135–145)

## 2021-06-15 LAB — MAGNESIUM: Magnesium: 2.4 mg/dL (ref 1.7–2.4)

## 2021-06-15 MED ORDER — PREDNISONE 10 MG PO TABS: ORAL_TABLET | ORAL | 0 refills | Status: DC

## 2021-06-15 MED ORDER — PANTOPRAZOLE SODIUM 40 MG PO TBEC: 40.0000 mg | DELAYED_RELEASE_TABLET | Freq: Every day | ORAL | 0 refills | Status: DC

## 2021-06-15 MED ORDER — ACETAMINOPHEN 325 MG PO TABS
650.0000 mg | ORAL_TABLET | Freq: Four times a day (QID) | ORAL | Status: DC | PRN
Start: 2021-06-15 — End: 2021-06-15
  Administered 2021-06-15: 650 mg via ORAL
  Filled 2021-06-15: qty 2

## 2021-06-15 NOTE — Discharge Summary (Signed)
Physician Discharge Summary  ?Monique Robles GHW:[id number] DOB: February 04, 1941 DOA: 06/13/2021 ? ?PCP: Celene Squibb, MD ? ?Admit date: 06/13/2021 ? ?Discharge date: 06/15/2021 ? ?Admitted From:Home ? ?Disposition:  Home ? ?Recommendations for Outpatient Follow-up:  ?Follow up with PCP in 1-2 weeks ?Follow-up with Dr. Melvyn Novas in 2 weeks outpatient ?Remain on prednisone taper over 2 weeks as prescribed ?Continue other home medications as prior ? ?Home Health: None ? ?Equipment/Devices: Has home 3 L nasal cannula ? ?Discharge Condition:Stable ? ?CODE STATUS: DNR ? ?Diet recommendation: Heart Healthy ? ?Brief/Interim Summary: ?Per HPI: ?Monique Robles is a 81 y.o. female with medical history significant of  A. fib on Eliquis, chronic respiratory failure on supplemental oxygen at 2 LPM due to ILD.  Essential tremor, diastolic CHF, GERD, history of pulmonary embolism, history of stroke, hypertension, vitamin D deficiency, essential tremor presented to the ED with worsening shortness of breath which worsens with exertion. ?Patient states that she has been in and out of hospital in the last couple of months due to the shortness of breath, she complains of occasional nonproductive cough and that she could barely make it to the bathroom without needing to rest prior to being able to return to her room. She denies fever, chills, headache, nausea, vomiting, abdominal pain. ?She was admitted from 12/31/20 to 01/07/2021 due to recurrent A-fib with RVR in which she was initially restarted on home propranolol and was initially started on amiodarone infusion as well as Cardizem infusion which was subsequently held due to hypotension, but patient continued on amiodarone.  Patient also had pulmonary fibrosis considered to be related to nitrofurantoin. ?  ?-Patient has been admitted with acute on chronic hypoxemic respiratory failure secondary to pulmonary fibrosis in the setting of prior nitrofurantoin use.  She was started on IV Solu-Medrol and has  had improvement in her symptoms particularly with exertion.  She is in stable condition for discharge and remains on her baseline level of oxygen use at 3 L.  No other acute acute events noted throughout the course of this admission and she is stable for discharge. ? ?Discharge Diagnoses:  ?Principal Problem: ?  Acute on chronic respiratory failure with hypoxia (HCC) ?Active Problems: ?  Essential tremor ?  Overactive bladder ?  GERD (gastroesophageal reflux disease) ?  Insomnia ?  Drug-induced pneumonitis ?  Atrial fibrillation, chronic ([place]) ?  Elevated brain natriuretic peptide (BNP) level ?  Thrombocytosis ?  History of pulmonary embolism ? ?Principal discharge diagnosis: Acute on chronic hypoxemic respiratory failure secondary to mild pulmonary fibrosis flare. ? ?Discharge Instructions ? ?Discharge Instructions   ? ? Diet - low sodium heart healthy   Complete by: As directed ?  ? Increase activity slowly   Complete by: As directed ?  ? ?  ? ?Allergies as of 06/15/2021   ? ?   Reactions  ? Coumadin [warfarin] Hives  ? Atorvastatin Other (See Comments)  ? Muscle aches  ? Ciprofloxacin   ? Macrobid [nitrofurantoin]   ? Cause respiratory flare  ? Sulfa Antibiotics Itching, Rash  ? ?  ? ?  ?Medication List  ?  ? ?STOP taking these medications   ? ?amoxicillin-clavulanate 875-125 MG tablet ?Commonly known as: AUGMENTIN ?  ?fluconazole 150 MG tablet ?Commonly known as: DIFLUCAN ?  ?senna-docusate 8.6-50 MG tablet ?Commonly known as: Senokot-S ?  ? ?  ? ?TAKE these medications   ? ?acetaminophen 650 MG CR tablet ?Commonly known as: TYLENOL ?Take 650 mg by mouth every 8 (eight) hours  as needed for pain. ?  ?apixaban 5 MG Tabs tablet ?Commonly known as: ELIQUIS ?Take 5 mg by mouth 2 (two) times daily. ?  ?calcium carbonate 500 MG chewable tablet ?Commonly known as: TUMS - dosed in mg elemental calcium ?Chew 1 tablet by mouth daily as needed for indigestion or heartburn. ?  ?cyclobenzaprine 5 MG tablet ?Commonly known as:  FLEXERIL ?Take 5 mg by mouth daily as needed. ?  ?diphenhydrAMINE 25 MG tablet ?Commonly known as: BENADRYL ?Take 25 mg by mouth daily as needed for allergies. ?  ?docusate sodium 100 MG capsule ?Commonly known as: COLACE ?Take 1 capsule (100 mg total) by mouth 2 (two) times daily. ?  ?ferrous sulfate 325 (65 FE) MG EC tablet ?Take 1 tablet (325 mg total) by mouth 2 (two) times daily. ?  ?furosemide 20 MG tablet ?Commonly known as: LASIX ?Take 2 tablets (40 mg total) by mouth daily. ?  ?gabapentin 100 MG capsule ?Commonly known as: NEURONTIN ?Take 200 mg by mouth See admin instructions. Take 200 mg in the morning and mid day ?  ?Gemtesa 75 MG Tabs ?Generic drug: Vibegron ?Take 1 capsule by mouth daily. ?  ?HYDROcodone-acetaminophen 5-325 MG tablet ?Commonly known as: NORCO/VICODIN ?Take 1 tablet by mouth 2 (two) times daily as needed for moderate pain. ?  ?levalbuterol 1.25 MG/0.5ML nebulizer solution ?Commonly known as: XOPENEX ?Take 1.25 mg by nebulization every 6 (six) hours as needed for wheezing or shortness of breath. ?  ?Melatonin 10 MG Tabs ?Take 10 mg by mouth at bedtime. ?  ?midodrine 5 MG tablet ?Commonly known as: PROAMATINE ?Take 1 tablet (5 mg total) by mouth 2 (two) times daily with a meal. ?  ?Multaq 400 MG tablet ?Generic drug: dronedarone ?Take 1 tablet (400 mg total) by mouth 2 (two) times daily with a meal. ?What changed:  ?when to take this ?additional instructions ?  ?multivitamin tablet ?Take 1 tablet by mouth daily. ?  ?omeprazole 40 MG capsule ?Commonly known as: PRILOSEC ?Take 1 capsule by mouth daily. ?  ?ondansetron 4 MG tablet ?Commonly known as: ZOFRAN ?Take 4 mg by mouth every 8 (eight) hours as needed. ?  ?pantoprazole 40 MG tablet ?Commonly known as: PROTONIX ?Take 1 tablet (40 mg total) by mouth daily for 14 days. ?Start taking on: June 16, 2021 ?  ?predniSONE 10 MG tablet ?Commonly known as: DELTASONE ?Take 4 tablets (40 mg total) by mouth daily for 3 days, THEN 3 tablets (30 mg  total) daily for 3 days, THEN 2 tablets (20 mg total) daily for 3 days, THEN 1 tablet (10 mg total) daily for 3 days. ?Start taking on: June 15, 2021 ?  ?primidone 50 MG tablet ?Commonly known as: MYSOLINE ?Take 50 mg by mouth 2 (two) times daily. ?  ?sucralfate 1 g tablet ?Commonly known as: CARAFATE ?Take 1 g by mouth at bedtime. ?  ?trimethoprim 100 MG tablet ?Commonly known as: TRIMPEX ?Take 1 tablet (100 mg total) by mouth at bedtime. ?  ?Vitamin D3 1.25 MG (50000 UT) Caps ?Take 1 capsule by mouth once a week. Monday at lunch ?  ? ?  ? ? Follow-up Information   ? ? Celene Squibb, MD. Schedule an appointment as soon as possible for a visit in 1 week(s).   ?Specialty: Internal Medicine ?Contact information: ?[address] ?[place] ?[place] [address] ?[phone] ? ? ?  ?  ? ? Tanda Rockers, MD. Schedule an appointment as soon as possible for a visit  in 2 week(s).   ?Specialty: Pulmonary Disease ?Contact information: ?[address] ?[address] ?[place] [address] ?[phone] ? ? ?  ?  ? ?  ?  ? ?  ? ?Allergies  ?Allergen Reactions  ? Coumadin [Warfarin] Hives  ? Atorvastatin Other (See Comments)  ?  Muscle aches  ? Ciprofloxacin   ? Macrobid [Nitrofurantoin]   ?  Cause respiratory flare  ? Sulfa Antibiotics Itching and Rash  ? ? ?Consultations: ?Pulmonology ? ? ?Procedures/Studies: ?CT Head Wo Contrast ? ?Result Date: 06/13/2021 ?CLINICAL DATA:  Shortness of breath, worsening headache over past couple days, abdominal pain, history of heart failure, atrial fibrillation, stroke EXAM: CT HEAD WITHOUT CONTRAST TECHNIQUE: Contiguous axial images were obtained from the base of the skull through the vertex without intravenous contrast. RADIATION DOSE REDUCTION: This exam was performed according to the departmental dose-optimization program which includes automated exposure control, adjustment of the mA and/or kV according to patient size and/or use of iterative reconstruction technique. COMPARISON:  MR head 09/09/2019  FINDINGS: Brain: Mild atrophy. Normal ventricular morphology. No midline shift or mass effect. Small vessel chronic ischemic changes of deep cerebral white matter. Old RIGHT occipital and temporo-occipita

## 2021-06-16 LAB — URINE CULTURE: Culture: >=100000 — AB

## 2021-06-19 ENCOUNTER — Other Ambulatory Visit: Payer: Self-pay

## 2021-06-19 DIAGNOSIS — I5030 Unspecified diastolic (congestive) heart failure: Secondary | ICD-10-CM

## 2021-06-19 NOTE — Patient Outreach (Signed)
[facility]) Care Management ? ?06/19/2021 ? ?Monique Robles ?1940-03-26 ?[id number] ? ? ?Received hospital referral from Dellis Anes for RN case manager for post hospital follow up. PCP is embedded with [facility], forwarded referral to [facility] for follow up. ? ?Monique Robles ?[place] Management Assistant ?[phone] ? ?

## 2021-06-23 ENCOUNTER — Encounter (HOSPITAL_COMMUNITY): Payer: Self-pay

## 2021-06-23 ENCOUNTER — Emergency Department (HOSPITAL_COMMUNITY): Payer: Medicare Other

## 2021-06-23 ENCOUNTER — Other Ambulatory Visit: Payer: Self-pay

## 2021-06-23 ENCOUNTER — Inpatient Hospital Stay (HOSPITAL_COMMUNITY)
Admission: EM | Admit: 2021-06-23 | Discharge: 2021-06-26 | DRG: 197 | Disposition: A | Discharge status: Skilled Nursing Facility | Payer: Medicare Other | Attending: Family Medicine | Admitting: Family Medicine

## 2021-06-23 DIAGNOSIS — I5022 Chronic systolic (congestive) heart failure: Secondary | ICD-10-CM | POA: Diagnosis not present

## 2021-06-23 DIAGNOSIS — R0609 Other forms of dyspnea: Secondary | ICD-10-CM | POA: Diagnosis not present

## 2021-06-23 DIAGNOSIS — J84112 Idiopathic pulmonary fibrosis: Secondary | ICD-10-CM | POA: Diagnosis not present

## 2021-06-23 DIAGNOSIS — E785 Hyperlipidemia, unspecified: Secondary | ICD-10-CM | POA: Diagnosis not present

## 2021-06-23 DIAGNOSIS — E538 Deficiency of other specified B group vitamins: Secondary | ICD-10-CM | POA: Diagnosis present

## 2021-06-23 DIAGNOSIS — Z820 Family history of epilepsy and other diseases of the nervous system: Secondary | ICD-10-CM

## 2021-06-23 DIAGNOSIS — R531 Weakness: Secondary | ICD-10-CM

## 2021-06-23 DIAGNOSIS — I340 Nonrheumatic mitral (valve) insufficiency: Secondary | ICD-10-CM | POA: Diagnosis not present

## 2021-06-23 DIAGNOSIS — R2689 Other abnormalities of gait and mobility: Secondary | ICD-10-CM | POA: Diagnosis not present

## 2021-06-23 DIAGNOSIS — M6281 Muscle weakness (generalized): Secondary | ICD-10-CM | POA: Diagnosis not present

## 2021-06-23 DIAGNOSIS — D509 Iron deficiency anemia, unspecified: Secondary | ICD-10-CM | POA: Diagnosis present

## 2021-06-23 DIAGNOSIS — M810 Age-related osteoporosis without current pathological fracture: Secondary | ICD-10-CM | POA: Diagnosis not present

## 2021-06-23 DIAGNOSIS — I110 Hypertensive heart disease with heart failure: Secondary | ICD-10-CM | POA: Diagnosis present

## 2021-06-23 DIAGNOSIS — Z79899 Other long term (current) drug therapy: Secondary | ICD-10-CM | POA: Diagnosis not present

## 2021-06-23 DIAGNOSIS — K219 Gastro-esophageal reflux disease without esophagitis: Secondary | ICD-10-CM | POA: Diagnosis not present

## 2021-06-23 DIAGNOSIS — E872 Acidosis, unspecified: Secondary | ICD-10-CM | POA: Diagnosis not present

## 2021-06-23 DIAGNOSIS — Z9981 Dependence on supplemental oxygen: Secondary | ICD-10-CM

## 2021-06-23 DIAGNOSIS — Z96652 Presence of left artificial knee joint: Secondary | ICD-10-CM | POA: Diagnosis present

## 2021-06-23 DIAGNOSIS — R0600 Dyspnea, unspecified: Secondary | ICD-10-CM

## 2021-06-23 DIAGNOSIS — Z888 Allergy status to other drugs, medicaments and biological substances status: Secondary | ICD-10-CM

## 2021-06-23 DIAGNOSIS — D72829 Elevated white blood cell count, unspecified: Secondary | ICD-10-CM | POA: Diagnosis present

## 2021-06-23 DIAGNOSIS — E559 Vitamin D deficiency, unspecified: Secondary | ICD-10-CM | POA: Diagnosis not present

## 2021-06-23 DIAGNOSIS — Z7401 Bed confinement status: Secondary | ICD-10-CM | POA: Diagnosis not present

## 2021-06-23 DIAGNOSIS — Z86711 Personal history of pulmonary embolism: Secondary | ICD-10-CM

## 2021-06-23 DIAGNOSIS — J9611 Chronic respiratory failure with hypoxia: Secondary | ICD-10-CM | POA: Diagnosis not present

## 2021-06-23 DIAGNOSIS — J9811 Atelectasis: Secondary | ICD-10-CM | POA: Diagnosis not present

## 2021-06-23 DIAGNOSIS — Z515 Encounter for palliative care: Secondary | ICD-10-CM | POA: Diagnosis not present

## 2021-06-23 DIAGNOSIS — Z7901 Long term (current) use of anticoagulants: Secondary | ICD-10-CM

## 2021-06-23 DIAGNOSIS — Z881 Allergy status to other antibiotic agents status: Secondary | ICD-10-CM

## 2021-06-23 DIAGNOSIS — R5383 Other fatigue: Secondary | ICD-10-CM

## 2021-06-23 DIAGNOSIS — Z833 Family history of diabetes mellitus: Secondary | ICD-10-CM

## 2021-06-23 DIAGNOSIS — Z87440 Personal history of urinary (tract) infections: Secondary | ICD-10-CM

## 2021-06-23 DIAGNOSIS — J849 Interstitial pulmonary disease, unspecified: Secondary | ICD-10-CM | POA: Diagnosis not present

## 2021-06-23 DIAGNOSIS — I700 Atherosclerosis of aorta: Secondary | ICD-10-CM | POA: Diagnosis not present

## 2021-06-23 DIAGNOSIS — J449 Chronic obstructive pulmonary disease, unspecified: Secondary | ICD-10-CM | POA: Diagnosis present

## 2021-06-23 DIAGNOSIS — Z809 Family history of malignant neoplasm, unspecified: Secondary | ICD-10-CM

## 2021-06-23 DIAGNOSIS — E611 Iron deficiency: Secondary | ICD-10-CM | POA: Diagnosis present

## 2021-06-23 DIAGNOSIS — Z66 Do not resuscitate: Secondary | ICD-10-CM | POA: Diagnosis present

## 2021-06-23 DIAGNOSIS — N3021 Other chronic cystitis with hematuria: Secondary | ICD-10-CM

## 2021-06-23 DIAGNOSIS — Z882 Allergy status to sulfonamides status: Secondary | ICD-10-CM | POA: Diagnosis not present

## 2021-06-23 DIAGNOSIS — I480 Paroxysmal atrial fibrillation: Secondary | ICD-10-CM | POA: Diagnosis not present

## 2021-06-23 DIAGNOSIS — R5381 Other malaise: Secondary | ICD-10-CM | POA: Diagnosis not present

## 2021-06-23 DIAGNOSIS — I5032 Chronic diastolic (congestive) heart failure: Secondary | ICD-10-CM | POA: Diagnosis present

## 2021-06-23 DIAGNOSIS — Z743 Need for continuous supervision: Secondary | ICD-10-CM | POA: Diagnosis not present

## 2021-06-23 DIAGNOSIS — Z20822 Contact with and (suspected) exposure to covid-19: Secondary | ICD-10-CM | POA: Diagnosis not present

## 2021-06-23 DIAGNOSIS — Z7189 Other specified counseling: Secondary | ICD-10-CM | POA: Diagnosis not present

## 2021-06-23 DIAGNOSIS — Z8249 Family history of ischemic heart disease and other diseases of the circulatory system: Secondary | ICD-10-CM

## 2021-06-23 DIAGNOSIS — R0602 Shortness of breath: Secondary | ICD-10-CM | POA: Diagnosis not present

## 2021-06-23 DIAGNOSIS — R54 Age-related physical debility: Secondary | ICD-10-CM | POA: Diagnosis present

## 2021-06-23 DIAGNOSIS — R251 Tremor, unspecified: Secondary | ICD-10-CM | POA: Diagnosis present

## 2021-06-23 DIAGNOSIS — Z8673 Personal history of transient ischemic attack (TIA), and cerebral infarction without residual deficits: Secondary | ICD-10-CM | POA: Diagnosis not present

## 2021-06-23 LAB — URINALYSIS, ROUTINE W REFLEX MICROSCOPIC
Bacteria, UA: NONE SEEN
Bilirubin Urine: NEGATIVE
Glucose, UA: NEGATIVE mg/dL
Ketones, ur: NEGATIVE mg/dL
Leukocytes,Ua: NEGATIVE
Nitrite: NEGATIVE
Protein, ur: NEGATIVE mg/dL
Specific Gravity, Urine: 1.014 (ref 1.005–1.030)
pH: 5 (ref 5.0–8.0)

## 2021-06-23 LAB — CBC WITH DIFFERENTIAL/PLATELET
Abs Immature Granulocytes: 0.1 10*3/uL — ABNORMAL HIGH (ref 0.00–0.07)
Basophils Absolute: 0 10*3/uL (ref 0.0–0.1)
Basophils Relative: 0 %
Eosinophils Absolute: 0 10*3/uL (ref 0.0–0.5)
Eosinophils Relative: 0 %
HCT: 30.1 % — ABNORMAL LOW (ref 36.0–46.0)
Hemoglobin: 9.2 g/dL — ABNORMAL LOW (ref 12.0–15.0)
Immature Granulocytes: 1 %
Lymphocytes Relative: 10 %
Lymphs Abs: 1.4 10*3/uL (ref 0.7–4.0)
MCH: 29.8 pg (ref 26.0–34.0)
MCHC: 30.6 g/dL (ref 30.0–36.0)
MCV: 97.4 fL (ref 80.0–100.0)
Monocytes Absolute: 0.2 10*3/uL (ref 0.1–1.0)
Monocytes Relative: 1 %
Neutro Abs: 12.1 10*3/uL — ABNORMAL HIGH (ref 1.7–7.7)
Neutrophils Relative %: 88 %
Platelets: 532 10*3/uL — ABNORMAL HIGH (ref 150–400)
RBC: 3.09 MIL/uL — ABNORMAL LOW (ref 3.87–5.11)
RDW: 13.5 % (ref 11.5–15.5)
WBC: 13.8 10*3/uL — ABNORMAL HIGH (ref 4.0–10.5)
nRBC: 0 % (ref 0.0–0.2)

## 2021-06-23 LAB — RESP PANEL BY RT-PCR (FLU A&B, COVID) ARPGX2
Influenza A by PCR: NEGATIVE
Influenza B by PCR: NEGATIVE
SARS Coronavirus 2 by RT PCR: NEGATIVE

## 2021-06-23 LAB — COMPREHENSIVE METABOLIC PANEL
ALT: 13 U/L (ref 0–44)
AST: 23 U/L (ref 15–41)
Albumin: 4.1 g/dL (ref 3.5–5.0)
Alkaline Phosphatase: 70 U/L (ref 38–126)
Anion gap: 10 (ref 5–15)
BUN: 30 mg/dL — ABNORMAL HIGH (ref 8–23)
CO2: 20 mmol/L — ABNORMAL LOW (ref 22–32)
Calcium: 8.6 mg/dL — ABNORMAL LOW (ref 8.9–10.3)
Chloride: 108 mmol/L (ref 98–111)
Creatinine, Ser: 1.17 mg/dL — ABNORMAL HIGH (ref 0.44–1.00)
GFR, Estimated: 48 mL/min — ABNORMAL LOW (ref >60)
Glucose, Bld: 178 mg/dL — ABNORMAL HIGH (ref 70–99)
Potassium: 4.3 mmol/L (ref 3.5–5.1)
Sodium: 138 mmol/L (ref 135–145)
Total Bilirubin: 0.4 mg/dL (ref 0.3–1.2)
Total Protein: 7.4 g/dL (ref 6.5–8.1)

## 2021-06-23 LAB — LACTIC ACID, PLASMA
Lactic Acid, Venous: 2.8 mmol/L (ref 0.5–1.9)
Lactic Acid, Venous: 2 mmol/L (ref 0.5–1.9)

## 2021-06-23 LAB — BRAIN NATRIURETIC PEPTIDE: B Natriuretic Peptide: 66 pg/mL (ref 0.0–100.0)

## 2021-06-23 IMAGING — DX DG CHEST 1V PORT
1 series · 1 of 1 positions shown · non-contrast
Comparison: Chest x-ray [DATE]

CLINICAL DATA: sob

EXAM:
PORTABLE CHEST 1 VIEW

[chest ap]
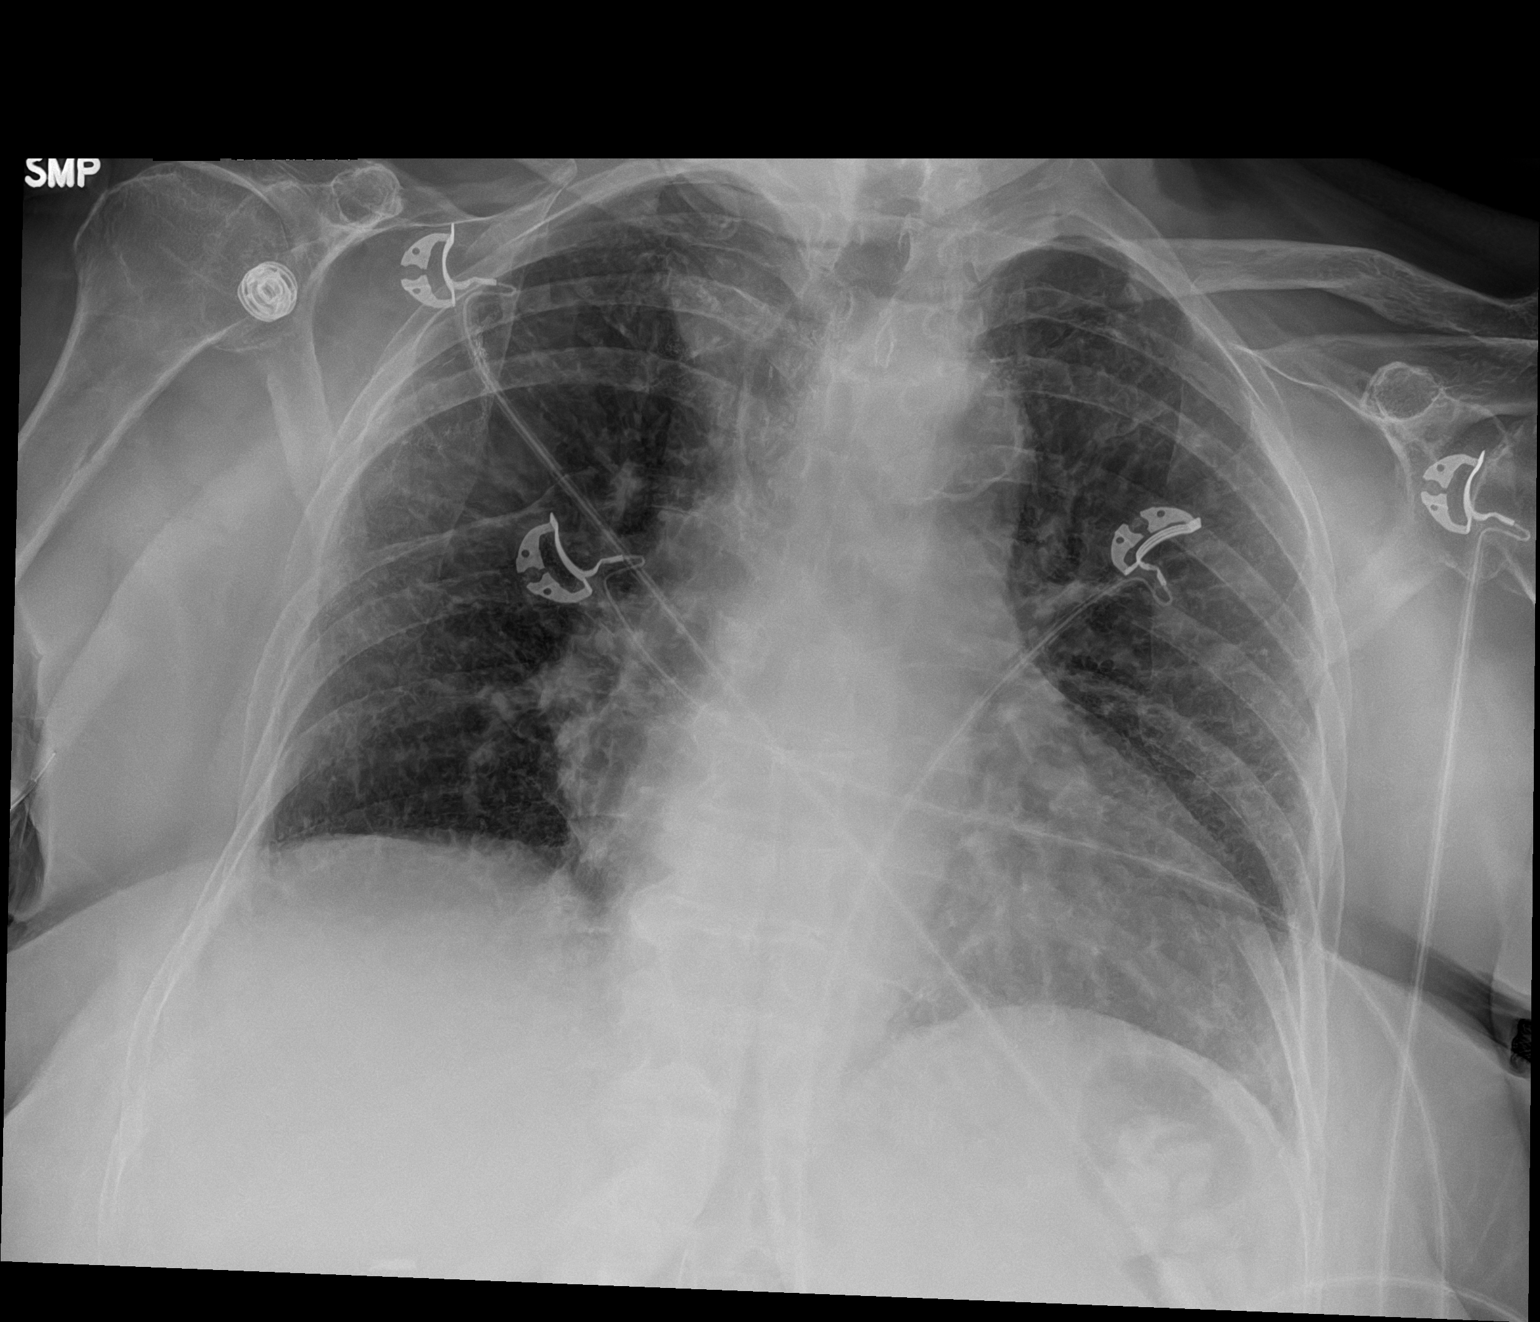

[1 of 1 positions shown; findings below may reference images not displayed]

FINDINGS: The heart and mediastinal contours are unchanged. Aortic
calcification.

Pulmonary staples overlie the right lung. Query developing
retrocardiac airspace opacity. Chronic coarsened interstitial
markings with no overt pulmonary edema. No pleural effusion. No
pneumothorax.

No acute osseous abnormality. Right acromioclavicular joint
degenerative changes.
IMPRESSION: 1. Query developing retrocardiac airspace opacity. Recommend PA and
lateral view for further evaluation.
2. Aortic Atherosclerosis ([K9]-[K9]).

## 2021-06-23 IMAGING — DX DG CHEST 2V
2 series · 2 of 2 positions shown · non-contrast
Comparison: Chest x-ray [DATE]

CLINICAL DATA: Shortness of breath

EXAM:
CHEST - 2 VIEW

[chest pa]
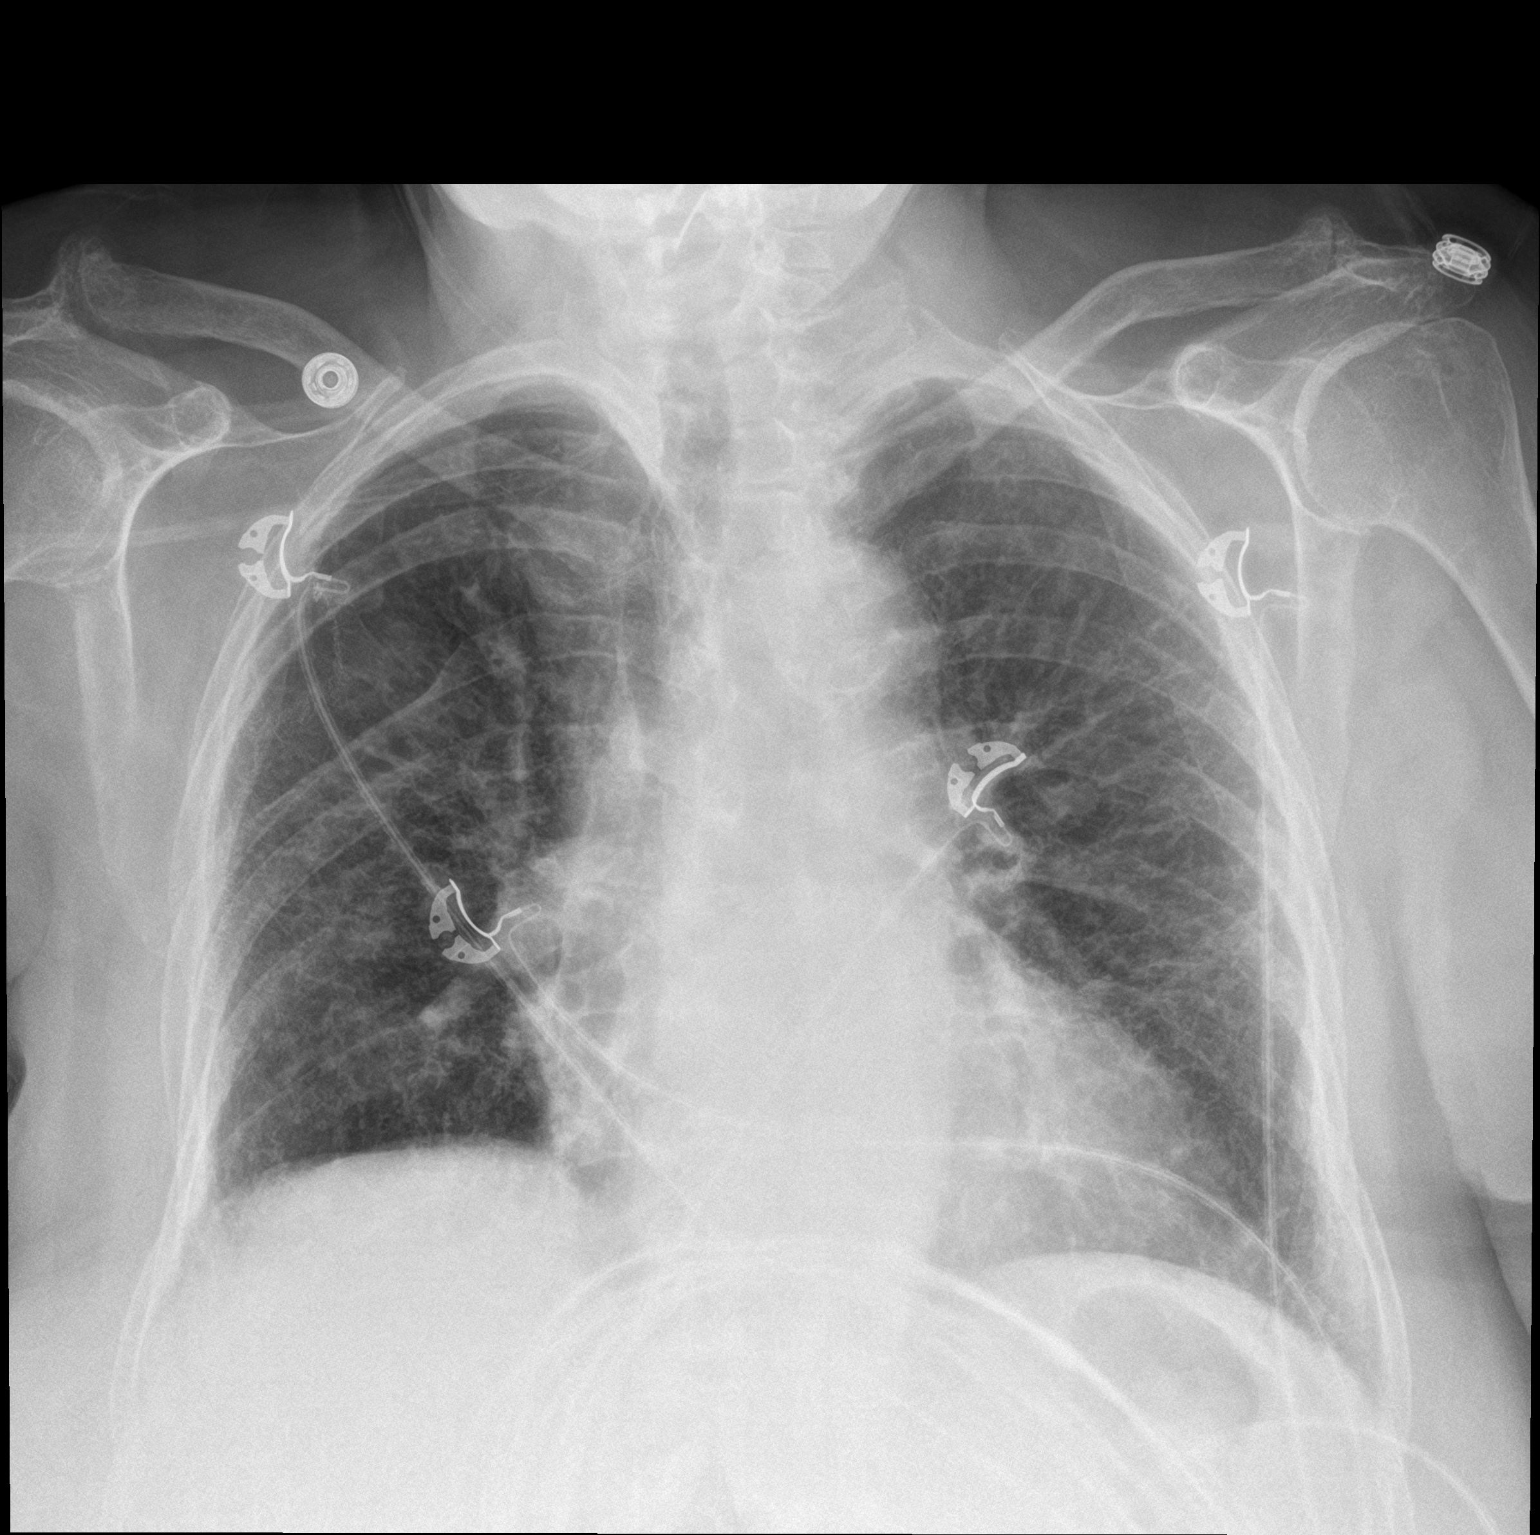

[chest lat]
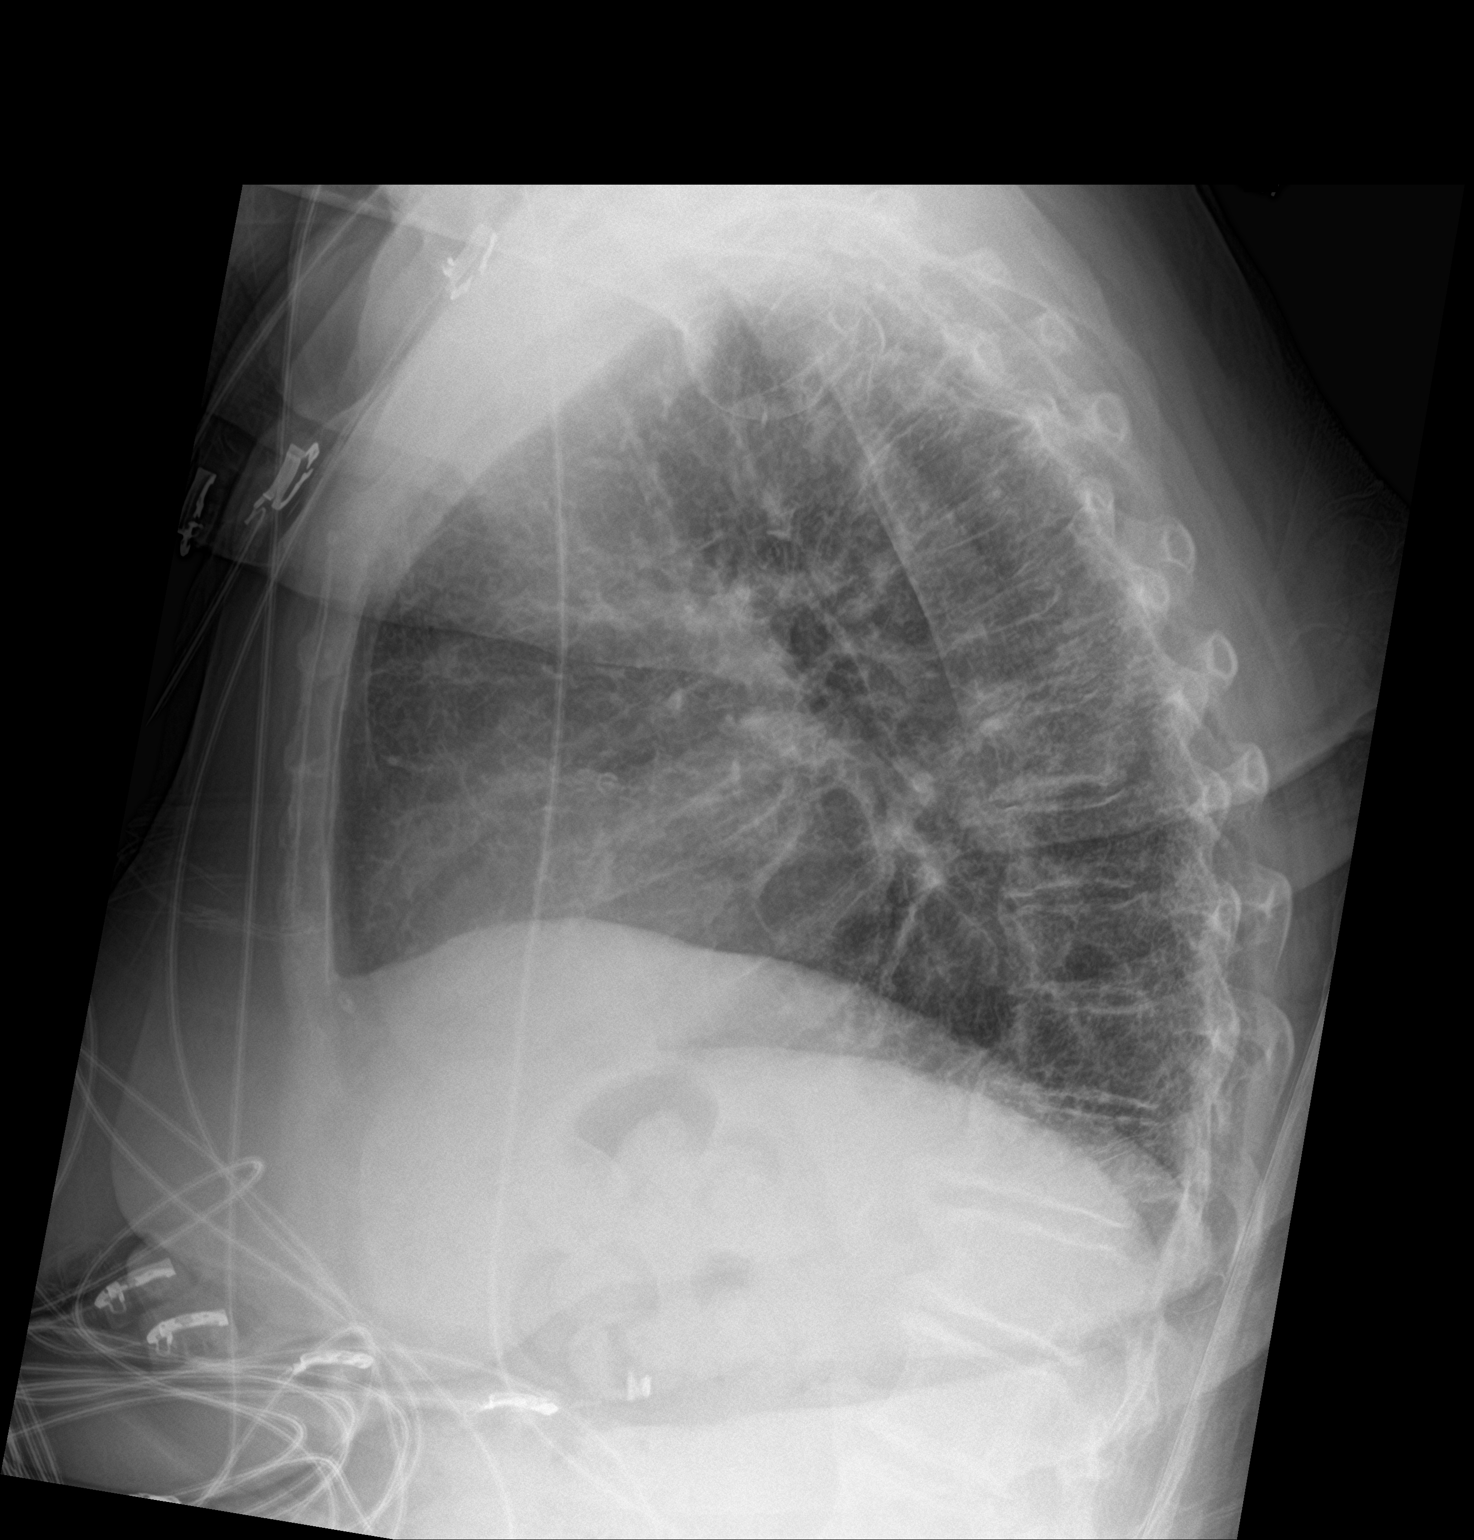

[2 of 2 positions shown; findings below may reference images not displayed]

FINDINGS: Heart is enlarged. Mediastinum appears stable. Calcified plaques in
the aortic arch. Chronic prominent interstitial lung markings. No
focal consolidation, pleural effusion or pneumothorax identified.
IMPRESSION: Chronic changes with no acute process identified.

## 2021-06-23 NOTE — ED Notes (Signed)
Verbal consent for MSE.  ?

## 2021-06-23 NOTE — ED Provider Notes (Signed)
?[facility] ?Provider Note ? ? ?CSN: [id number] ?Arrival date & time: 06/23/21  1652 ? ?  ? ?History ? ?Chief Complaint  ?Patient presents with  ? Shortness of Breath  ? ? ?Monique Robles is a 81 y.o. female. ? ? ?Shortness of Breath ?Associated symptoms: no fever   ?Patient with shortness of breath and fatigue.  Cannot get up and even walk to the bathroom anymore due to more shortness of breath.  Discharge about a week ago with COPD exacerbation.  Culture from recent hospitalization taken on 10 days ago showed E. coli and greater 100,000 CFU's.  2 days ago started on Augmentin by PCP.  Wears chronic oxygen around 3 L.  Patient and her daughter state that when she walks she really has no energy and gets more short of breath with the oxygen level has not been decreasing when she does this.  Not having fevers.  Has had dysuria and lower abdominal pain however. ?  ? ?Home Medications ?Prior to Admission medications   ?Medication Sig Start Date End Date Taking? Authorizing Provider  ?acetaminophen (TYLENOL) 650 MG CR tablet Take 650 mg by mouth every 8 (eight) hours as needed for pain.    [provider]  ?apixaban (ELIQUIS) 5 MG TABS tablet Take 5 mg by mouth 2 (two) times daily.    [provider]  ?calcium carbonate (TUMS - DOSED IN MG ELEMENTAL CALCIUM) 500 MG chewable tablet Chew 1 tablet by mouth daily as needed for indigestion or heartburn.    [provider]  ?Cholecalciferol (VITAMIN D3) 1.25 MG (50000 UT) CAPS Take 1 capsule by mouth once a week. Monday at lunch 04/17/21   [provider]  ?cyclobenzaprine (FLEXERIL) 5 MG tablet Take 5 mg by mouth daily as needed. 07/31/20   [provider]  ?diphenhydrAMINE (BENADRYL) 25 MG tablet Take 25 mg by mouth daily as needed for allergies.    [provider]  ?docusate sodium (COLACE) 100 MG capsule Take 1 capsule (100 mg total) by mouth 2 (two) times daily. 01/07/21   Kathie Dike, MD   ?dronedarone (MULTAQ) 400 MG tablet Take 1 tablet (400 mg total) by mouth 2 (two) times daily with a meal. ?Patient taking differently: Take 400 mg by mouth See admin instructions. Take 1 tablet in the morning and 1 tablet at 6-630 pm with meals 03/26/21   Evans Lance, MD  ?ferrous sulfate 325 (65 FE) MG EC tablet Take 1 tablet (325 mg total) by mouth 2 (two) times daily. ?Patient not taking: Reported on 06/13/2021 01/07/21 01/07/22  Kathie Dike, MD  ?furosemide (LASIX) 20 MG tablet Take 2 tablets (40 mg total) by mouth daily. 01/07/21   Kathie Dike, MD  ?gabapentin (NEURONTIN) 100 MG capsule Take 200 mg by mouth See admin instructions. Take 200 mg in the morning and mid day    [provider]  ?HYDROcodone-acetaminophen (NORCO/VICODIN) 5-325 MG tablet Take 1 tablet by mouth 2 (two) times daily as needed for moderate pain. 11/29/20   Amin, Jeanella Flattery, MD  ?levalbuterol (XOPENEX) 1.25 MG/0.5ML nebulizer solution Take 1.25 mg by nebulization every 6 (six) hours as needed for wheezing or shortness of breath. 11/29/20   Amin, Jeanella Flattery, MD  ?Melatonin 10 MG TABS Take 10 mg by mouth at bedtime.    [provider]  ?midodrine (PROAMATINE) 5 MG tablet Take 1 tablet (5 mg total) by mouth 2 (two) times daily with a meal. 01/29/21   Evans Lance,  MD  ?Multiple Vitamin (MULTIVITAMIN) tablet Take 1 tablet by mouth daily.    [provider]  ?omeprazole (PRILOSEC) 40 MG capsule Take 1 capsule by mouth daily. 06/21/20   [provider]  ?ondansetron (ZOFRAN) 4 MG tablet Take 4 mg by mouth every 8 (eight) hours as needed. 04/17/21   [provider]  ?pantoprazole (PROTONIX) 40 MG tablet Take 1 tablet (40 mg total) by mouth daily for 14 days. 06/16/21 06/30/21  Manuella Ghazi, Pratik D, DO  ?predniSONE (DELTASONE) 10 MG tablet Take 4 tablets (40 mg total) by mouth daily for 3 days, THEN 3 tablets (30 mg total) daily for 3 days, THEN 2 tablets (20 mg total) daily for 3 days, THEN 1 tablet  (10 mg total) daily for 3 days. 06/15/21 06/27/21  Manuella Ghazi, Pratik D, DO  ?primidone (MYSOLINE) 50 MG tablet Take 50 mg by mouth 2 (two) times daily. 04/17/21   [provider]  ?sucralfate (CARAFATE) 1 g tablet Take 1 g by mouth at bedtime.    [provider]  ?trimethoprim (TRIMPEX) 100 MG tablet Take 1 tablet (100 mg total) by mouth at bedtime. 05/10/21   McKenzie, Candee Furbish, MD  ?Vibegron (GEMTESA) 75 MG TABS Take 1 capsule by mouth daily. 05/10/21   McKenzie, Candee Furbish, MD  ?   ? ?Allergies    ?Coumadin [warfarin], Atorvastatin, Ciprofloxacin, Macrobid [nitrofurantoin], and Sulfa antibiotics   ? ?Review of Systems   ?Review of Systems  ?Constitutional:  Positive for appetite change and fatigue. Negative for fever.  ?Respiratory:  Positive for shortness of breath.   ?Genitourinary:  Positive for dysuria.  ? ?Physical Exam ?Updated Vital Signs ?BP (!) 105/52   Pulse 65   Temp 97.9 ?F (36.6 ?C) (Oral)   Resp (!) 23   Ht '5\' 1"'$  (1.549 m)   Wt 77.9 kg   SpO2 100%   BMI 32.45 kg/m?  ?Physical Exam ?Vitals and nursing note reviewed.  ?HENT:  ?   Head: Atraumatic.  ?Cardiovascular:  ?   Rate and Rhythm: Normal rate. Rhythm irregular.  ?Pulmonary:  ?   Comments: Mildly harsh breath sounds without focal rales or wheezes. ?Chest:  ?   Chest wall: No tenderness.  ?Abdominal:  ?   Tenderness: There is abdominal tenderness.  ?   Comments: Mild lower abdominal tenderness no rebound or guarding.  No hernia palpated.  ?Musculoskeletal:  ?   Right lower leg: Edema present.  ?   Left lower leg: Edema present.  ?   Comments: Mild edema bilateral lower extremities  ?Skin: ?   Capillary Refill: Capillary refill takes less than 2 seconds.  ?Neurological:  ?   Mental Status: She is alert.  ? ? ?ED Results / Procedures / Treatments   ?Labs ?(all labs ordered are listed, but only abnormal results are displayed) ?Labs Reviewed  ?COMPREHENSIVE METABOLIC PANEL - Abnormal; Notable for the following components:  ?    Result Value   ? CO2 20 (*)   ? Glucose, Bld 178 (*)   ? BUN 30 (*)   ? Creatinine, Ser 1.17 (*)   ? Calcium 8.6 (*)   ? GFR, Estimated 48 (*)   ? All other components within normal limits  ?CBC WITH DIFFERENTIAL/PLATELET - Abnormal; Notable for the following components:  ? WBC 13.8 (*)   ? RBC 3.09 (*)   ? Hemoglobin 9.2 (*)   ? HCT 30.1 (*)   ? Platelets 532 (*)   ? Neutro Abs 12.1 (*)   ?  Abs Immature Granulocytes 0.10 (*)   ? All other components within normal limits  ?URINALYSIS, ROUTINE W REFLEX MICROSCOPIC - Abnormal; Notable for the following components:  ? Color, Urine AMBER (*)   ? Hgb urine dipstick MODERATE (*)   ? All other components within normal limits  ?LACTIC ACID, PLASMA - Abnormal; Notable for the following components:  ? Lactic Acid, Venous 2.8 (*)   ? All other components within normal limits  ?LACTIC ACID, PLASMA - Abnormal; Notable for the following components:  ? Lactic Acid, Venous 2.0 (*)   ? All other components within normal limits  ?RESP PANEL BY RT-PCR (FLU A&B, COVID) [id number]  ?CULTURE, BLOOD (ROUTINE X 2)  ?CULTURE, BLOOD (ROUTINE X 2)  ?BRAIN NATRIURETIC PEPTIDE  ? ? ?EKG ?EKG Interpretation ? ?Date/Time:  Sunday June 23 2021 17:00:33 EDT ?Ventricular Rate:  81 ?PR Interval:    ?QRS Duration: 92 ?QT Interval:  378 ?QTC Calculation: 439 ?R Axis:   51 ?Text Interpretation: sinus with PVCs Low voltage, precordial leads Confirmed by Davonna Belling [phone]) on 06/23/2021 5:10:18 PM ? ?Radiology ?DG Chest 2 View ? ?Result Date: 06/23/2021 ?CLINICAL DATA:  Shortness of breath EXAM: CHEST - 2 VIEW COMPARISON:  Chest x-ray 06/23/2021 FINDINGS: Heart is enlarged. Mediastinum appears stable. Calcified plaques in the aortic arch. Chronic prominent interstitial lung markings. No focal consolidation, pleural effusion or pneumothorax identified. IMPRESSION: Chronic changes with no acute process identified. Electronically Signed   By: Ofilia Neas M.D.   On: 06/23/2021 18:31  ? ?DG Chest Portable 1  View ? ?Result Date: 06/23/2021 ?CLINICAL DATA:  sob EXAM: PORTABLE CHEST 1 VIEW COMPARISON:  Chest x-ray 06/13/2021 FINDINGS: The heart and mediastinal contours are unchanged. Aortic calcification. Pulmonary staples overlie

## 2021-06-23 NOTE — ED Triage Notes (Signed)
Patient complaining of SOB, chest pain, generalized weakness. Currently being treated for UTI. Patient wears oxygen at 3LPM cont. Pain in left arm. ?

## 2021-06-24 ENCOUNTER — Observation Stay (HOSPITAL_COMMUNITY): Payer: Medicare Other

## 2021-06-24 DIAGNOSIS — K219 Gastro-esophageal reflux disease without esophagitis: Secondary | ICD-10-CM

## 2021-06-24 DIAGNOSIS — J9611 Chronic respiratory failure with hypoxia: Secondary | ICD-10-CM

## 2021-06-24 DIAGNOSIS — J9811 Atelectasis: Secondary | ICD-10-CM | POA: Diagnosis not present

## 2021-06-24 DIAGNOSIS — D72829 Elevated white blood cell count, unspecified: Secondary | ICD-10-CM | POA: Diagnosis present

## 2021-06-24 DIAGNOSIS — Z7189 Other specified counseling: Secondary | ICD-10-CM | POA: Diagnosis not present

## 2021-06-24 DIAGNOSIS — Z515 Encounter for palliative care: Secondary | ICD-10-CM

## 2021-06-24 DIAGNOSIS — R531 Weakness: Secondary | ICD-10-CM | POA: Diagnosis not present

## 2021-06-24 DIAGNOSIS — R0602 Shortness of breath: Secondary | ICD-10-CM | POA: Diagnosis not present

## 2021-06-24 DIAGNOSIS — E872 Acidosis, unspecified: Secondary | ICD-10-CM

## 2021-06-24 DIAGNOSIS — I700 Atherosclerosis of aorta: Secondary | ICD-10-CM | POA: Diagnosis not present

## 2021-06-24 LAB — RETICULOCYTES
Immature Retic Fract: 30 % — ABNORMAL HIGH (ref 2.3–15.9)
RBC.: 2.55 MIL/uL — ABNORMAL LOW (ref 3.87–5.11)
Retic Count, Absolute: 86.2 10*3/uL (ref 19.0–186.0)
Retic Ct Pct: 3.4 % — ABNORMAL HIGH (ref 0.4–3.1)

## 2021-06-24 LAB — COMPREHENSIVE METABOLIC PANEL
ALT: 10 U/L (ref 0–44)
AST: 15 U/L (ref 15–41)
Albumin: 3.3 g/dL — ABNORMAL LOW (ref 3.5–5.0)
Alkaline Phosphatase: 53 U/L (ref 38–126)
Anion gap: 6 (ref 5–15)
BUN: 28 mg/dL — ABNORMAL HIGH (ref 8–23)
CO2: 24 mmol/L (ref 22–32)
Calcium: 8.2 mg/dL — ABNORMAL LOW (ref 8.9–10.3)
Chloride: 110 mmol/L (ref 98–111)
Creatinine, Ser: 0.91 mg/dL (ref 0.44–1.00)
GFR, Estimated: >60 mL/min (ref >60)
Glucose, Bld: 102 mg/dL — ABNORMAL HIGH (ref 70–99)
Potassium: 4.5 mmol/L (ref 3.5–5.1)
Sodium: 140 mmol/L (ref 135–145)
Total Bilirubin: 0.3 mg/dL (ref 0.3–1.2)
Total Protein: 6 g/dL — ABNORMAL LOW (ref 6.5–8.1)

## 2021-06-24 LAB — CBC WITH DIFFERENTIAL/PLATELET
Abs Immature Granulocytes: 0.05 10*3/uL (ref 0.00–0.07)
Basophils Absolute: 0 10*3/uL (ref 0.0–0.1)
Basophils Relative: 0 %
Eosinophils Absolute: 0 10*3/uL (ref 0.0–0.5)
Eosinophils Relative: 0 %
HCT: 23.6 % — ABNORMAL LOW (ref 36.0–46.0)
Hemoglobin: 7.2 g/dL — ABNORMAL LOW (ref 12.0–15.0)
Immature Granulocytes: 1 %
Lymphocytes Relative: 18 %
Lymphs Abs: 1.8 10*3/uL (ref 0.7–4.0)
MCH: 29.4 pg (ref 26.0–34.0)
MCHC: 30.5 g/dL (ref 30.0–36.0)
MCV: 96.3 fL (ref 80.0–100.0)
Monocytes Absolute: 0.8 10*3/uL (ref 0.1–1.0)
Monocytes Relative: 8 %
Neutro Abs: 7.4 10*3/uL (ref 1.7–7.7)
Neutrophils Relative %: 73 %
Platelets: 406 10*3/uL — ABNORMAL HIGH (ref 150–400)
RBC: 2.45 MIL/uL — ABNORMAL LOW (ref 3.87–5.11)
RDW: 13.4 % (ref 11.5–15.5)
WBC: 10 10*3/uL (ref 4.0–10.5)
nRBC: 0 % (ref 0.0–0.2)

## 2021-06-24 LAB — PREPARE RBC (CROSSMATCH)

## 2021-06-24 LAB — IRON AND TIBC
Iron: 12 ug/dL — ABNORMAL LOW (ref 28–170)
Saturation Ratios: 2 % — ABNORMAL LOW (ref 10.4–31.8)
TIBC: 482 ug/dL — ABNORMAL HIGH (ref 250–450)
UIBC: 470 ug/dL

## 2021-06-24 LAB — FERRITIN: Ferritin: 9 ng/mL — ABNORMAL LOW (ref 11–307)

## 2021-06-24 LAB — ABO/RH: ABO/RH(D): O POS

## 2021-06-24 LAB — TSH: TSH: 0.31 u[IU]/mL — ABNORMAL LOW (ref 0.350–4.500)

## 2021-06-24 LAB — FOLATE: Folate: 18.6 ng/mL (ref >5.9)

## 2021-06-24 LAB — MAGNESIUM: Magnesium: 2.3 mg/dL (ref 1.7–2.4)

## 2021-06-24 LAB — T4, FREE: Free T4: 1.59 ng/dL — ABNORMAL HIGH (ref 0.61–1.12)

## 2021-06-24 LAB — PROCALCITONIN: Procalcitonin: <0.1 ng/mL

## 2021-06-24 LAB — TROPONIN I (HIGH SENSITIVITY): Troponin I (High Sensitivity): 4 ng/L (ref <18)

## 2021-06-24 LAB — VITAMIN B12: Vitamin B-12: 176 pg/mL — ABNORMAL LOW (ref 180–914)

## 2021-06-24 MED ORDER — FERROUS SULFATE 325 (65 FE) MG PO TABS
325.0000 mg | ORAL_TABLET | Freq: Every day | ORAL | Status: DC
  Administered 2021-06-25 – 2021-06-26 (×2): 325 mg via ORAL
  Filled 2021-06-24 (×2): qty 1

## 2021-06-24 MED ORDER — GUAIFENESIN ER 600 MG PO TB12
600.0000 mg | ORAL_TABLET | Freq: Two times a day (BID) | ORAL | Status: DC
  Administered 2021-06-24 – 2021-06-26 (×4): 600 mg via ORAL
  Filled 2021-06-24 (×4): qty 1

## 2021-06-24 MED ORDER — MIDODRINE HCL 5 MG PO TABS
5.0000 mg | ORAL_TABLET | Freq: Two times a day (BID) | ORAL | Status: DC
  Administered 2021-06-24 – 2021-06-26 (×6): 5 mg via ORAL
  Filled 2021-06-24 (×6): qty 1

## 2021-06-24 MED ORDER — CYANOCOBALAMIN 1000 MCG/ML IJ SOLN
1000.0000 ug | Freq: Every day | INTRAMUSCULAR | Status: AC
  Administered 2021-06-24 – 2021-06-26 (×3): 1000 ug via INTRAMUSCULAR
  Filled 2021-06-24 (×3): qty 1

## 2021-06-24 MED ORDER — VIBEGRON 75 MG PO TABS: 1.0000 | ORAL_TABLET | Freq: Every day | ORAL | Status: DC

## 2021-06-24 MED ORDER — HYDROCODONE-ACETAMINOPHEN 5-325 MG PO TABS: 1.0000 | ORAL_TABLET | Freq: Two times a day (BID) | ORAL | Status: DC | PRN

## 2021-06-24 MED ORDER — MELATONIN 3 MG PO TABS
6.0000 mg | ORAL_TABLET | Freq: Every day | ORAL | Status: DC
  Administered 2021-06-24 – 2021-06-25 (×3): 6 mg via ORAL
  Filled 2021-06-24 (×3): qty 2

## 2021-06-24 MED ORDER — MORPHINE SULFATE (PF) 2 MG/ML IV SOLN: 1.0000 mg | INTRAVENOUS | Status: DC | PRN

## 2021-06-24 MED ORDER — GABAPENTIN 100 MG PO CAPS: 200.0000 mg | ORAL_CAPSULE | ORAL | Status: DC

## 2021-06-24 MED ORDER — ONDANSETRON HCL 4 MG PO TABS: 4.0000 mg | ORAL_TABLET | Freq: Four times a day (QID) | ORAL | Status: DC | PRN

## 2021-06-24 MED ORDER — ACETAMINOPHEN 650 MG RE SUPP: 650.0000 mg | Freq: Four times a day (QID) | RECTAL | Status: DC | PRN

## 2021-06-24 MED ORDER — IPRATROPIUM-ALBUTEROL 0.5-2.5 (3) MG/3ML IN SOLN
3.0000 mL | Freq: Four times a day (QID) | RESPIRATORY_TRACT | Status: DC
  Filled 2021-06-24 (×2): qty 3

## 2021-06-24 MED ORDER — DRONEDARONE HCL 400 MG PO TABS
400.0000 mg | ORAL_TABLET | Freq: Two times a day (BID) | ORAL | Status: DC
  Administered 2021-06-24: 400 mg via ORAL
  Filled 2021-06-24 (×3): qty 1

## 2021-06-24 MED ORDER — GABAPENTIN 100 MG PO CAPS
200.0000 mg | ORAL_CAPSULE | Freq: Two times a day (BID) | ORAL | Status: DC
  Administered 2021-06-24 – 2021-06-26 (×6): 200 mg via ORAL
  Filled 2021-06-24 (×6): qty 2

## 2021-06-24 MED ORDER — ONDANSETRON HCL 4 MG/2ML IJ SOLN: 4.0000 mg | Freq: Four times a day (QID) | INTRAMUSCULAR | Status: DC | PRN

## 2021-06-24 MED ORDER — METHYLPREDNISOLONE SODIUM SUCC 40 MG IJ SOLR
40.0000 mg | Freq: Two times a day (BID) | INTRAMUSCULAR | Status: DC
  Administered 2021-06-24 – 2021-06-26 (×5): 40 mg via INTRAVENOUS
  Filled 2021-06-24 (×5): qty 1

## 2021-06-24 MED ORDER — OXYCODONE HCL 5 MG PO TABS
5.0000 mg | ORAL_TABLET | ORAL | Status: DC | PRN
  Administered 2021-06-24 – 2021-06-25 (×2): 5 mg via ORAL
  Filled 2021-06-24 (×2): qty 1

## 2021-06-24 MED ORDER — PRIMIDONE 50 MG PO TABS
50.0000 mg | ORAL_TABLET | Freq: Two times a day (BID) | ORAL | Status: DC
  Administered 2021-06-24 – 2021-06-26 (×5): 50 mg via ORAL
  Filled 2021-06-24 (×6): qty 1

## 2021-06-24 MED ORDER — ONE-DAILY MULTI VITAMINS PO TABS: 1.0000 | ORAL_TABLET | Freq: Every day | ORAL | Status: DC

## 2021-06-24 MED ORDER — SODIUM CHLORIDE 0.9% IV SOLUTION: Freq: Once | INTRAVENOUS | Status: AC

## 2021-06-24 MED ORDER — PANTOPRAZOLE SODIUM 40 MG PO TBEC
40.0000 mg | DELAYED_RELEASE_TABLET | Freq: Every day | ORAL | Status: DC
  Administered 2021-06-24 – 2021-06-26 (×3): 40 mg via ORAL
  Filled 2021-06-24 (×3): qty 1

## 2021-06-24 MED ORDER — ALBUTEROL SULFATE (2.5 MG/3ML) 0.083% IN NEBU: 2.5000 mg | INHALATION_SOLUTION | RESPIRATORY_TRACT | Status: DC | PRN

## 2021-06-24 MED ORDER — SODIUM CHLORIDE 0.9 % IV SOLN: INTRAVENOUS | Status: DC

## 2021-06-24 MED ORDER — SODIUM CHLORIDE 0.9 % IV BOLUS
500.0000 mL | Freq: Once | INTRAVENOUS | Status: AC
  Administered 2021-06-24: 500 mL via INTRAVENOUS

## 2021-06-24 MED ORDER — FUROSEMIDE 40 MG PO TABS
40.0000 mg | ORAL_TABLET | Freq: Every day | ORAL | Status: DC
  Administered 2021-06-24 – 2021-06-26 (×3): 40 mg via ORAL
  Filled 2021-06-24 (×3): qty 1

## 2021-06-24 MED ORDER — APIXABAN 5 MG PO TABS
5.0000 mg | ORAL_TABLET | Freq: Two times a day (BID) | ORAL | Status: DC
  Administered 2021-06-24 – 2021-06-26 (×6): 5 mg via ORAL
  Filled 2021-06-24 (×6): qty 1

## 2021-06-24 MED ORDER — ACETAMINOPHEN 325 MG PO TABS
650.0000 mg | ORAL_TABLET | Freq: Four times a day (QID) | ORAL | Status: DC | PRN
  Administered 2021-06-24 – 2021-06-26 (×3): 650 mg via ORAL
  Filled 2021-06-24 (×3): qty 2

## 2021-06-24 MED ORDER — ADULT MULTIVITAMIN W/MINERALS CH
1.0000 | ORAL_TABLET | Freq: Every day | ORAL | Status: DC
  Administered 2021-06-25 – 2021-06-26 (×2): 1 via ORAL
  Filled 2021-06-24 (×2): qty 1

## 2021-06-24 MED ORDER — MORPHINE SULFATE (PF) 2 MG/ML IV SOLN: 2.0000 mg | INTRAVENOUS | Status: DC | PRN

## 2021-06-24 NOTE — Consult Note (Signed)
? ?                                                                                ?Consultation Note ?Date: 06/24/2021  ? ?Patient Name: Monique Robles  ?DOB: 1940-06-08  MRN: [id number]  Age / Sex: 81 y.o., female  ?PCP: Celene Squibb, MD ?Referring Physician: Murlean Iba, MD ? ?Reason for Consultation: Establishing goals of care ? ?HPI/Patient Profile: 81 y.o. female  with past medical history of atrial fibrillation on Eliquis, chronic respiratory failure on supplemental oxygen at 2L due to pulmonary fibrosis (associated with nitrofurantoin? Amiodarone?), essential tremor, HFpEF, GERD, pulmonary embolism, stroke, hypertension, vitamin D deficiency admitted on 06/23/2021 with worsening shortness of breath and fatigue, nonproductive cough, chest pain, palpitations.  ? ?Clinical Assessment and Goals of Care: ?I met today with [name] and her daughter, Monique Robles, at bedside after reviewing records including recurrent hospitalizations and decline since September 2022. Ms. Buel is lying in bed and appears with weakness and fatigue. She is extremely sweet but also able to express her frustration with her physical limitations and decline. Allowed her space to speak of the scary feeling of being short of breath. I reassured [name] that there are many ways that we can help to relieve this feeling even when the treatments are not working to make her better we have medications to give her relief.  ? ?We reviewed medications, diagnostics, and plans. They are awaiting to hear from pulmonology. We discussed that her primary problem is her lung disease and all her other health issues further complicate her breathing and overall wellbeing. They are hopeful that there will be some adjustments in medications or steroids to provide her with relief for longer to be able to maintain out of the hospital longer. There hope is to pursue rehab and to be able to maintain longer - recognizing that significant improvement is not  likely realistic. I introduced the idea of discussion if [name] continues to struggle and continues on the path of needing to be re-hospitalized every few weeks without much overall improvement. They continue to be hopeful and take this one day at a time. [name] continues to endorse desire to pursue rehab with ultimate goal to return back home where she lives with [name] ([name] and her husband work during the day). [name] does endorse that she feels better already after her admission.  ? ?All questions/concerns addressed. Emotional support provided.  ? ?Primary Decision Maker ?PATIENT ?  ? ?SUMMARY OF RECOMMENDATIONS   ?- Ongoing discussions and support needed ? ?Code Status/Advance Care Planning: ?DNR (not discussed today) ? ? ?Symptom Management:  ?Per attending and pulmonary.  ? ?Palliative Prophylaxis:  ?Aspiration, Bowel Regimen, and Frequent Pain Assessment ? ?Prognosis:  ?Overall prognosis poor with worsening lung disease and declining reserve.  ? ?Discharge Planning: To Be Determined  ? ?  ? ?Primary Diagnoses: ?Present on Admission: ? GERD (gastroesophageal reflux disease) ? History of pulmonary embolism ? Chronic respiratory failure with hypoxia (HCC) ? Leukocytosis ? ? ?I have reviewed the medical record, interviewed the patient and family, and examined the patient. The following aspects are pertinent. ? ?Past Medical History:  ?Diagnosis  Date  ? (HFpEF) heart failure with preserved ejection fraction ([place])   ? a. 09/2020 Echo: EF 65-70%, no rwma, GrI DD, nl RV fxn, mildly dil LA. Mild MR. Mild AS; b. 12/2020 Echo: EF 55-60%. Nl RV fxn.  ? Aortic insufficiency   ? a. 09/2020 Echo: No significant AI.  ? Chest pain   ? a. 10/2020 MV: EF 68%, no ischemia/scar.  ? Chronic respiratory failure ([place])   ? DOE (dyspnea on exertion)   ? GERD (gastroesophageal reflux disease)   ? History of pneumonia   ? History of pulmonary embolism 2001  ? History of recurrent UTIs   ? History of stroke 2018  ? HTN  (hypertension)   ? Hyperlipemia   ? Hypotension   ? a. On midodrine.  ? Mitral valve regurgitation   ? a. 09/2020 Echo: Mild MR.  ? Osteoporosis   ? PAF (paroxysmal atrial fibrillation) ([place])   ? a. CHA2DS2VASc = 7-->Eliquis; b. 12/2020 Amio started-->DCCV x 1; c. 01/2021 ED eval for AF-->DCCV x 1.  ? Tremor   ? Vitamin D deficiency   ? ?Social History  ? ?Socioeconomic History  ? Marital status: Widowed  ?  Spouse name: Not on file  ? Number of children: 3  ? Years of education: 108  ? Highest education level: High school graduate  ?Occupational History  ? Occupation: Retired  ?Tobacco Use  ? Smoking status: Never  ? Smokeless tobacco: Never  ?Vaping Use  ? Vaping Use: Never used  ?Substance and Sexual Activity  ? Alcohol use: Not Currently  ? Drug use: Never  ? Sexual activity: Not on file  ?Other Topics Concern  ? Not on file  ?Social History Narrative  ? Lives with her daughter.  ? Right-handed.  ? Rare caffeine use.  ? ?Social Determinants of Health  ? ?Financial Resource Strain: Not on file  ?Food Insecurity: Not on file  ?Transportation Needs: Not on file  ?Physical Activity: Not on file  ?Stress: Not on file  ?Social Connections: Not on file  ? ?Family History  ?Problem Relation Age of Onset  ? Alzheimer's disease Mother   ?     died at 61  ? Cancer Mother   ? Tremor Mother   ? Pneumonia Father   ? Heart disease Father   ? Diabetes Father   ? Heart attack Father   ?     died at 33  ? ?Scheduled Meds: ? apixaban  5 mg Oral BID  ? gabapentin  200 mg Oral BID  ? melatonin  6 mg Oral QHS  ? methylPREDNISolone (SOLU-MEDROL) injection  40 mg Intravenous Q12H  ? midodrine  5 mg Oral BID WC  ? pantoprazole  40 mg Oral Daily  ? primidone  50 mg Oral BID  ? ?Continuous Infusions: ? sodium chloride 50 mL/hr at 06/24/21 0901  ? ?PRN Meds:.acetaminophen **OR** acetaminophen, albuterol, morphine injection, ondansetron **OR** ondansetron (ZOFRAN) IV, oxyCODONE ?Allergies  ?Allergen Reactions  ? Coumadin [Warfarin] Hives  ?  Atorvastatin Other (See Comments)  ?  Muscle aches  ? Ciprofloxacin   ? Macrobid [Nitrofurantoin]   ?  Cause respiratory flare  ? Sulfa Antibiotics Itching and Rash  ? ?Review of Systems  ?Constitutional:  Positive for activity change and fatigue.  ?Respiratory:  Positive for cough and shortness of breath.   ?Neurological:  Positive for weakness.  ? ?Physical Exam ?Vitals and nursing note reviewed.  ?Constitutional:   ?   General: She is  not in acute distress. ?   Appearance: She is ill-appearing.  ?Cardiovascular:  ?   Rate and Rhythm: Normal rate.  ?Pulmonary:  ?   Effort: No tachypnea, accessory muscle usage or respiratory distress.  ?Neurological:  ?   Mental Status: She is alert and oriented to person, place, and time.  ? ? ?Vital Signs: BP (!) 104/52 (BP Location: Left Arm)   Pulse 73   Temp 98.2 ?F (36.8 ?C) (Oral)   Resp 18   Ht '5\' 1"'  (1.549 m)   Wt 77.9 kg   SpO2 100%   BMI 32.45 kg/m?  ?Pain Scale: 0-10 ?  ?Pain Score: 5  ? ? ?SpO2: SpO2: 100 % ?O2 Device:SpO2: 100 % ?O2 Flow Rate: .O2 Flow Rate (L/min): 3 L/min ? ?IO: Intake/output summary:  ?Intake/Output Summary (Last 24 hours) at 06/24/2021 1544 ?Last data filed at 06/24/2021 1500 ?Gross per 24 hour  ?Intake 541.67 ml  ?Output 650 ml  ?Net -108.33 ml  ? ? ?LBM: Last BM Date : 06/23/21 ?Baseline Weight: Weight: 77.9 kg ?Most recent weight: Weight: 77.9 kg     ?Palliative Assessment/Data: ? ? ? ? ?Time In: 1600 ? ?Time Total: 75 min ?Greater than 50%  of this time was spent counseling and coordinating care related to the above assessment and plan. ? ?Signed by: ?Vinie Sill, NP ?Palliative Medicine Team ?Pager # [phone] (M-F 8a-5p) ?Team Phone # [phone] (Nights/Weekends) ?  ? ? ? ? ? ? ? ? ? ? ? ? ?  ?

## 2021-06-24 NOTE — Plan of Care (Signed)
?  Problem: Acute Rehab PT Goals(only PT should resolve) ?Goal: Pt Will Go Supine/Side To Sit ?Outcome: Progressing ?Flowsheets (Taken 06/24/2021 1321) ?Pt will go Supine/Side to Sit: with min guard assist ?Goal: Pt Will Go Sit To Supine/Side ?Outcome: Progressing ?Flowsheets (Taken 06/24/2021 1321) ?Pt will go Sit to Supine/Side: ? with min guard assist ? with minimal assist ?Goal: Patient Will Transfer Sit To/From Stand ?Outcome: Progressing ?Flowsheets (Taken 06/24/2021 1321) ?Patient will transfer sit to/from stand: with min guard assist ?Goal: Pt Will Transfer Bed To Chair/Chair To Bed ?Outcome: Progressing ?Flowsheets (Taken 06/24/2021 1321) ?Pt will Transfer Bed to Chair/Chair to Bed: min guard assist ?Goal: Pt Will Ambulate ?Outcome: Progressing ?Flowsheets (Taken 06/24/2021 1321) ?Pt will Ambulate: ? 25 feet ? with rolling walker ? with min guard assist ? with minimal assist ?Goal: Pt/caregiver will Perform Home Exercise Program ?Outcome: Progressing ?Flowsheets (Taken 06/24/2021 1321) ?Pt/caregiver will Perform Home Exercise Program: ? For increased strengthening ? For improved balance ? Independently ?  ? ?1:22 PM, 06/24/21 ?Mearl Latin PT, DPT ?Physical Therapist at [facility] ?[facility] ? ?

## 2021-06-24 NOTE — TOC Progression Note (Signed)
Transition of Care (TOC) - Progression Note  ? ? ?Patient Details  ?Name: Monique Robles ?MRN: [id number] ?Date of Birth: 1940-07-30 ? ?Transition of Care (TOC) CM/SW Contact  ?Shade Flood, LCSW ?Phone Number: ?06/24/2021, 3:04 PM ? ?Clinical Narrative:    ? ?TOC following. PT recommending SNF rehab at dc. Spoke with pt/dtr to update. They are agreeable to SNF referrals. CMS provider options reviewed and TOC will refer as requested.  ? ?Expected Discharge Plan: [place] ?Barriers to Discharge: Continued Medical Work up ? ?Expected Discharge Plan and Services ?Expected Discharge Plan: [place] ?  ?  ?  ?Living arrangements for the past 2 months: [place] ?                ?  ?  ?  ?  ?  ?  ?  ?  ?  ?  ? ? ?Social Determinants of Health (SDOH) Interventions ?  ? ?Readmission Risk Interventions ? ?  11/29/2020  ?  1:39 PM 11/28/2020  ? 10:54 AM  ?Readmission Risk Prevention Plan  ?Transportation Screening  Complete  ?PCP or Specialist Appt within 5-7 Days Complete   ?Home Care Screening  Complete  ?Medication Review (RN CM)  Complete  ? ? ?

## 2021-06-24 NOTE — [place] FL2 (Signed)
?[place] MEDICAID FL2 LEVEL OF CARE SCREENING TOOL  ?  ? ?IDENTIFICATION  ?Patient Name: ?Monique Robles Birthdate: 26-Oct-1940 Sex: female Admission Date (Current Location): ?06/23/2021  ?[place] and [place] Number: ? [place] and Address:  ?[facility] [address], [place] ?     Provider Number: ?[id number]  ?Attending Physician Name and Address:  ?Murlean Iba, MD ? Relative Name and Phone Number:  ?Chapmon,[name] (Daughter) [phone] ?   ?Current Level of Care: ?Hospital Recommended Level of Care: ?[place] Prior Approval Number: ?  ? ?Date Approved/Denied: ?  PASRR Number: ?[id number] A ? ?Discharge Plan: ?SNF ?  ? ?Current Diagnoses: ?Patient Active Problem List  ? Diagnosis Date Noted  ? Generalized weakness 06/24/2021  ? Leukocytosis 06/24/2021  ? Lactic acidosis 06/24/2021  ? Elevated brain natriuretic peptide (BNP) level 06/14/2021  ? Thrombocytosis 06/14/2021  ? History of pulmonary embolism 06/14/2021  ? Acute on chronic respiratory failure with hypoxia ([place]) 06/13/2021  ? Atrial fibrillation, chronic ([place]) 01/16/2021  ? CHF (congestive heart failure) ([place]) 01/16/2021  ? Severe sepsis ([place]) 12/31/2020  ? Chronic respiratory failure with hypoxia ([place]) 12/06/2020  ? DOE (dyspnea on exertion) 12/05/2020  ? Esophageal dysphagia   ? Chronic lung disease   ? Drug-induced pneumonitis   ? Iron deficiency anemia due to chronic blood loss 11/23/2020  ? Acute respiratory failure with hypoxia ([place]) 11/21/2020  ? GERD (gastroesophageal reflux disease) 11/21/2020  ? Bronchitis 11/21/2020  ? Obesity (BMI 30-39.9) 11/21/2020  ? Insomnia 11/21/2020  ? Acute respiratory failure ([place]) 11/21/2020  ? Overactive bladder 08/24/2020  ? Acute cystitis without hematuria 08/24/2020  ? Chronic cystitis with hematuria 08/24/2020  ? Essential tremor 08/15/2019  ? Cerebrovascular accident (CVA) ([place]) 08/15/2019  ? Gait abnormality 08/15/2019  ? ? ?Orientation RESPIRATION  BLADDER Height & Weight   ?  ?Self, Time, Situation, Place ? O2 (3L) Continent Weight: 171 lb 11.8 oz (77.9 kg) ?Height:  '5\' 1"'$  (154.9 cm)  ?BEHAVIORAL SYMPTOMS/MOOD NEUROLOGICAL BOWEL NUTRITION STATUS  ?    Continent Diet (Heart healthy)  ?AMBULATORY STATUS COMMUNICATION OF NEEDS Skin   ?Extensive Assist Verbally Normal ?  ?  ?  ?    ?     ?     ? ? ?Personal Care Assistance Level of Assistance  ?Bathing, Feeding, Dressing Bathing Assistance: Limited assistance ?Feeding assistance: Independent ?Dressing Assistance: Limited assistance ?   ? ?Functional Limitations Info  ?Sight, Speech, Hearing Sight Info: Adequate ?Hearing Info: Adequate ?Speech Info: Adequate  ? ? ?SPECIAL CARE FACTORS FREQUENCY  ?PT (By licensed PT), OT (By licensed OT)   ?  ?PT Frequency: 5 times weekly ?OT Frequency: 5 times weekly ?  ?  ?  ?   ? ? ?Contractures Contractures Info: Not present  ? ? ?Additional Factors Info  ?Code Status, Allergies Code Status Info: DNR ?Allergies Info: Coumadin (Warfarin), Atorvastatin, Ciprofloxacin, Macrobid (Nitrofurantoin), Sulfa Antibiotics ?  ?  ?  ?   ? ?Current Medications (06/24/2021):  This is the current hospital active medication list ?Current Facility-Administered Medications  ?Medication Dose Route Frequency Provider Last Rate Last Admin  ? 0.9 %  sodium chloride infusion   Intravenous Continuous Johnson, Clanford L, MD 50 mL/hr at 06/24/21 0901 Rate Change at 06/24/21 0901  ? acetaminophen (TYLENOL) tablet 650 mg  650 mg Oral Q6H PRN Zierle-Ghosh, Asia B, DO   650 mg at 06/24/21 0757  ? Or  ? acetaminophen (TYLENOL) suppository 650 mg  650 mg Rectal Q6H PRN Zierle-Ghosh, Asia B, DO      ? albuterol (PROVENTIL) (2.5 MG/3ML) 0.083% nebulizer solution 2.5 mg  2.5 mg Nebulization Q2H PRN Zierle-Ghosh, Asia B, DO      ? apixaban (ELIQUIS) tablet 5 mg  5 mg Oral BID Zierle-Ghosh, Asia B, DO   5 mg at 06/24/21 0857  ? gabapentin (NEURONTIN) capsule 200 mg  200 mg Oral BID Johnson, Clanford L, MD   200 mg  at 06/24/21 1215  ? melatonin tablet 6 mg  6 mg Oral QHS Zierle-Ghosh, Asia B, DO   6 mg at 06/24/21 0117  ? methylPREDNISolone sodium succinate (SOLU-MEDROL) 40 mg/mL injection 40 mg  40 mg Intravenous Q12H Johnson, Clanford L, MD   40 mg at 06/24/21 0900  ? midodrine (PROAMATINE) tablet 5 mg  5 mg Oral BID WC Zierle-Ghosh, Asia B, DO   5 mg at 06/24/21 7591  ? morphine (PF) 2 MG/ML injection 1 mg  1 mg Intravenous Q4H PRN Johnson, Clanford L, MD      ? ondansetron (ZOFRAN) tablet 4 mg  4 mg Oral Q6H PRN Zierle-Ghosh, Asia B, DO      ? Or  ? ondansetron (ZOFRAN) injection 4 mg  4 mg Intravenous Q6H PRN Zierle-Ghosh, Asia B, DO      ? oxyCODONE (Oxy IR/ROXICODONE) immediate release tablet 5 mg  5 mg Oral Q4H PRN Zierle-Ghosh, Asia B, DO   5 mg at 06/24/21 0118  ? pantoprazole (PROTONIX) EC tablet 40 mg  40 mg Oral Daily Zierle-Ghosh, Asia B, DO   40 mg at 06/24/21 0856  ? primidone (MYSOLINE) tablet 50 mg  50 mg Oral BID Zierle-Ghosh, Asia B, DO   50 mg at 06/24/21 0856  ? ? ? ?Discharge Medications: ?Please see discharge summary for a list of discharge medications. ? ?Relevant Imaging Results: ? ?Relevant Lab Results: ? ? ?Additional Information ?SSN: [id number] ? ?Iona Beard, LCSWA ? ? ? ? ?

## 2021-06-24 NOTE — Assessment & Plan Note (Addendum)
-   Initially 2.8 and then 2.0 ?-TREATED  ?

## 2021-06-24 NOTE — TOC Initial Note (Signed)
Transition of Care (TOC) - Initial/Assessment Note  ? ? ?Patient Details  ?Name: Monique Robles ?MRN: [id number] ?Date of Birth: 1941-02-06 ? ?Transition of Care (TOC) CM/SW Contact:    ?Iona Beard, LCSWA ?Phone Number: ?06/24/2021, 10:02 AM ? ?Clinical Narrative:                 ?Pt is high risk for readmission. CSW spoke with pt to complete assessment. Pt states that she lives with her daughter. Pt is independent in completing her ADLs. Pt states that she has not been driving and her daughter has been providing transportation as needed. Pt states that she has not had HH and is not interested in those services. Pt has a walker that she uses for ambulation. Pt is chronic on 3L O2 and it is supplied through [employer]. TOC to follow.  ? ?Expected Discharge Plan: Home/Self Care ?Barriers to Discharge: Continued Medical Work up ? ? ?Patient Goals and CMS Choice ?Patient states their goals for this hospitalization and ongoing recovery are:: Return home ?CMS Medicare.gov Compare Post Acute Care list provided to:: Patient ?Choice offered to / list presented to : Patient, Adult Children ? ?Expected Discharge Plan and Services ?Expected Discharge Plan: Home/Self Care ?  ?  ?  ?Living arrangements for the past 2 months: [place] ?                ?  ?  ?  ?  ?  ?  ?  ?  ?  ?  ? ?Prior Living Arrangements/Services ?Living arrangements for the past 2 months: [place] ?Lives with:: Adult Children ?Patient language and need for interpreter reviewed:: Yes ?Do you feel safe going back to the place where you live?: Yes      ?Need for Family Participation in Patient Care: Yes (Comment) ?Care giver support system in place?: Yes (comment) ?Current home services: DME ?Criminal Activity/Legal Involvement Pertinent to Current Situation/Hospitalization: No - Comment as needed ? ?Activities of Daily Living ?  ?  ? ?Permission Sought/Granted ?  ?  ?   ?   ?   ?   ? ?Emotional Assessment ?Appearance:: Appears stated  age ?Attitude/Demeanor/Rapport: Engaged ?Affect (typically observed): Accepting ?Orientation: : Oriented to Self, Oriented to Place, Oriented to  Time, Oriented to Situation ?Alcohol / Substance Use: Not Applicable ?Psych Involvement: No (comment) ? ?Admission diagnosis:  Generalized weakness [R53.1] ?Fatigue, unspecified type [R53.83] ?Dyspnea, unspecified type [R06.00] ?Patient Active Problem List  ? Diagnosis Date Noted  ? Generalized weakness 06/24/2021  ? Leukocytosis 06/24/2021  ? Lactic acidosis 06/24/2021  ? Elevated brain natriuretic peptide (BNP) level 06/14/2021  ? Thrombocytosis 06/14/2021  ? History of pulmonary embolism 06/14/2021  ? Acute on chronic respiratory failure with hypoxia ([place]) 06/13/2021  ? Atrial fibrillation, chronic ([place]) 01/16/2021  ? CHF (congestive heart failure) ([place]) 01/16/2021  ? Severe sepsis ([place]) 12/31/2020  ? Chronic respiratory failure with hypoxia ([place]) 12/06/2020  ? DOE (dyspnea on exertion) 12/05/2020  ? Esophageal dysphagia   ? Chronic lung disease   ? Drug-induced pneumonitis   ? Iron deficiency anemia due to chronic blood loss 11/23/2020  ?  Class: Chronic  ? Acute respiratory failure with hypoxia ([place]) 11/21/2020  ? GERD (gastroesophageal reflux disease) 11/21/2020  ? Bronchitis 11/21/2020  ? Obesity (BMI 30-39.9) 11/21/2020  ? Insomnia 11/21/2020  ? Acute respiratory failure ([place]) 11/21/2020  ? Overactive bladder 08/24/2020  ? Acute cystitis without hematuria 08/24/2020  ? Chronic cystitis with hematuria 08/24/2020  ?  Essential tremor 08/15/2019  ? Cerebrovascular accident (CVA) ([place]) 08/15/2019  ? Gait abnormality 08/15/2019  ? ?PCP:  Celene Squibb, MD ?Pharmacy:   ?[place], [place] [address] ?[address] ?[place] [address] ?Phone: [phone] Fax: [phone] ? ? ? ? ?Social Determinants of Health (SDOH) Interventions ?  ? ?Readmission Risk Interventions ? ?  11/29/2020  ?  1:39 PM 11/28/2020  ? 10:54 AM  ?Readmission Risk  Prevention Plan  ?Transportation Screening  Complete  ?PCP or Specialist Appt within 5-7 Days Complete   ?Home Care Screening  Complete  ?Medication Review (RN CM)  Complete  ? ? ? ?

## 2021-06-24 NOTE — Progress Notes (Signed)
ASSUMPTION OF CARE NOTE  ? ?06/24/2021 ?4:24 PM ? ?Monique Robles was seen and examined.  The H&P by the admitting provider, orders, imaging was reviewed.  Please see new orders.  Will continue to follow.  Pt's Hg down to 7.2.  Will transfuse 1 unit PRBC. Will ask pulmonary to consult per family request and will get 2D echocardiogram.   ? ?Vitals:  ? 06/24/21 0858 06/24/21 1319  ?BP: (!) 140/53 (!) 104/52  ?Pulse: (!) 58 73  ?Resp: 19 18  ?Temp: (!) 97.5 ?F (36.4 ?C) 98.2 ?F (36.8 ?C)  ?SpO2: 100% 100%  ? ? ?Results for orders placed or performed during the hospital encounter of 06/23/21  ?Resp Panel by RT-PCR (Flu A&B, Covid) Nasopharyngeal Swab  ? Specimen: Nasopharyngeal Swab; Nasopharyngeal(NP) swabs in vial transport medium  ?Result Value Ref Range  ? SARS Coronavirus 2 by RT PCR NEGATIVE NEGATIVE  ? Influenza A by PCR NEGATIVE NEGATIVE  ? Influenza B by PCR NEGATIVE NEGATIVE  ?Culture, blood (routine x 2)  ? Specimen: Right Antecubital; Blood  ?Result Value Ref Range  ? Specimen Description    ?  RIGHT ANTECUBITAL BOTTLES DRAWN AEROBIC AND ANAEROBIC  ? Special Requests    ?  Blood Culture adequate volume ?Performed at [facility], [address], [place], [place] [address] ?  ? Culture PENDING   ? Report Status PENDING   ?Culture, blood (routine x 2)  ? Specimen: Left Antecubital; Blood  ?Result Value Ref Range  ? Specimen Description    ?  LEFT ANTECUBITAL BOTTLES DRAWN AEROBIC AND ANAEROBIC  ? Special Requests    ?  Blood Culture adequate volume ?Performed at [facility], [address], [place], [place] [address] ?  ? Culture PENDING   ? Report Status PENDING   ?Comprehensive metabolic panel  ?Result Value Ref Range  ? Sodium 138 135 - 145 mmol/L  ? Potassium 4.3 3.5 - 5.1 mmol/L  ? Chloride 108 98 - 111 mmol/L  ? CO2 20 (L) 22 - 32 mmol/L  ? Glucose, Bld 178 (H) 70 - 99 mg/dL  ? BUN 30 (H) 8 - 23 mg/dL  ? Creatinine, Ser 1.17 (H) 0.44 - 1.00 mg/dL  ? Calcium 8.6 (L) 8.9 - 10.3 mg/dL  ? Total Protein 7.4 6.5 -  8.1 g/dL  ? Albumin 4.1 3.5 - 5.0 g/dL  ? AST 23 15 - 41 U/L  ? ALT 13 0 - 44 U/L  ? Alkaline Phosphatase 70 38 - 126 U/L  ? Total Bilirubin 0.4 0.3 - 1.2 mg/dL  ? GFR, Estimated 48 (L) >60 mL/min  ? Anion gap 10 5 - 15  ?CBC with Differential  ?Result Value Ref Range  ? WBC 13.8 (H) 4.0 - 10.5 K/uL  ? RBC 3.09 (L) 3.87 - 5.11 MIL/uL  ? Hemoglobin 9.2 (L) 12.0 - 15.0 g/dL  ? HCT 30.1 (L) 36.0 - 46.0 %  ? MCV 97.4 80.0 - 100.0 fL  ? MCH 29.8 26.0 - 34.0 pg  ? MCHC 30.6 30.0 - 36.0 g/dL  ? RDW 13.5 11.5 - 15.5 %  ? Platelets 532 (H) 150 - 400 K/uL  ? nRBC 0.0 0.0 - 0.2 %  ? Neutrophils Relative % 88 %  ? Neutro Abs 12.1 (H) 1.7 - 7.7 K/uL  ? Lymphocytes Relative 10 %  ? Lymphs Abs 1.4 0.7 - 4.0 K/uL  ? Monocytes Relative 1 %  ? Monocytes Absolute 0.2 0.1 - 1.0 K/uL  ? Eosinophils Relative  0 %  ? Eosinophils Absolute 0.0 0.0 - 0.5 K/uL  ? Basophils Relative 0 %  ? Basophils Absolute 0.0 0.0 - 0.1 K/uL  ? Immature Granulocytes 1 %  ? Abs Immature Granulocytes 0.10 (H) 0.00 - 0.07 K/uL  ?Urinalysis, Routine w reflex microscopic Urine, In & Out Cath  ?Result Value Ref Range  ? Color, Urine AMBER (A) YELLOW  ? APPearance CLEAR CLEAR  ? Specific Gravity, Urine 1.014 1.005 - 1.030  ? pH 5.0 5.0 - 8.0  ? Glucose, UA NEGATIVE NEGATIVE mg/dL  ? Hgb urine dipstick MODERATE (A) NEGATIVE  ? Bilirubin Urine NEGATIVE NEGATIVE  ? Ketones, ur NEGATIVE NEGATIVE mg/dL  ? Protein, ur NEGATIVE NEGATIVE mg/dL  ? Nitrite NEGATIVE NEGATIVE  ? Leukocytes,Ua NEGATIVE NEGATIVE  ? RBC / HPF 11-20 0 - 5 RBC/hpf  ? WBC, UA 0-5 0 - 5 WBC/hpf  ? Bacteria, UA NONE SEEN NONE SEEN  ? Mucus PRESENT   ? Hyaline Casts, UA PRESENT   ?Brain natriuretic peptide  ?Result Value Ref Range  ? B Natriuretic Peptide 66.0 0.0 - 100.0 pg/mL  ?Lactic acid, plasma  ?Result Value Ref Range  ? Lactic Acid, Venous 2.8 (HH) 0.5 - 1.9 mmol/L  ?Lactic acid, plasma  ?Result Value Ref Range  ? Lactic Acid, Venous 2.0 (HH) 0.5 - 1.9 mmol/L  ?Procalcitonin - Baseline  ?Result  Value Ref Range  ? Procalcitonin <0.10 ng/mL  ?Comprehensive metabolic panel  ?Result Value Ref Range  ? Sodium 140 135 - 145 mmol/L  ? Potassium 4.5 3.5 - 5.1 mmol/L  ? Chloride 110 98 - 111 mmol/L  ? CO2 24 22 - 32 mmol/L  ? Glucose, Bld 102 (H) 70 - 99 mg/dL  ? BUN 28 (H) 8 - 23 mg/dL  ? Creatinine, Ser 0.91 0.44 - 1.00 mg/dL  ? Calcium 8.2 (L) 8.9 - 10.3 mg/dL  ? Total Protein 6.0 (L) 6.5 - 8.1 g/dL  ? Albumin 3.3 (L) 3.5 - 5.0 g/dL  ? AST 15 15 - 41 U/L  ? ALT 10 0 - 44 U/L  ? Alkaline Phosphatase 53 38 - 126 U/L  ? Total Bilirubin 0.3 0.3 - 1.2 mg/dL  ? GFR, Estimated >60 >60 mL/min  ? Anion gap 6 5 - 15  ?Magnesium  ?Result Value Ref Range  ? Magnesium 2.3 1.7 - 2.4 mg/dL  ?CBC with Differential/Platelet  ?Result Value Ref Range  ? WBC 10.0 4.0 - 10.5 K/uL  ? RBC 2.45 (L) 3.87 - 5.11 MIL/uL  ? Hemoglobin 7.2 (L) 12.0 - 15.0 g/dL  ? HCT 23.6 (L) 36.0 - 46.0 %  ? MCV 96.3 80.0 - 100.0 fL  ? MCH 29.4 26.0 - 34.0 pg  ? MCHC 30.5 30.0 - 36.0 g/dL  ? RDW 13.4 11.5 - 15.5 %  ? Platelets 406 (H) 150 - 400 K/uL  ? nRBC 0.0 0.0 - 0.2 %  ? Neutrophils Relative % 73 %  ? Neutro Abs 7.4 1.7 - 7.7 K/uL  ? Lymphocytes Relative 18 %  ? Lymphs Abs 1.8 0.7 - 4.0 K/uL  ? Monocytes Relative 8 %  ? Monocytes Absolute 0.8 0.1 - 1.0 K/uL  ? Eosinophils Relative 0 %  ? Eosinophils Absolute 0.0 0.0 - 0.5 K/uL  ? Basophils Relative 0 %  ? Basophils Absolute 0.0 0.0 - 0.1 K/uL  ? Immature Granulocytes 1 %  ? Abs Immature Granulocytes 0.05 0.00 - 0.07 K/uL  ?TSH  ?Result Value Ref Range  ?  TSH 0.310 (L) 0.350 - 4.500 uIU/mL  ?T4, free  ?Result Value Ref Range  ? Free T4 1.59 (H) 0.61 - 1.12 ng/dL  ?Vitamin B12  ?Result Value Ref Range  ? Vitamin B-12 176 (L) 180 - 914 pg/mL  ?Folate  ?Result Value Ref Range  ? Folate 18.6 >5.9 ng/mL  ?Iron and TIBC  ?Result Value Ref Range  ? Iron 12 (L) 28 - 170 ug/dL  ? TIBC 482 (H) 250 - 450 ug/dL  ? Saturation Ratios 2 (L) 10.4 - 31.8 %  ? UIBC 470 ug/dL  ?Ferritin  ?Result Value Ref Range  ?  Ferritin 9 (L) 11 - 307 ng/mL  ?Reticulocytes  ?Result Value Ref Range  ? Retic Ct Pct 3.4 (H) 0.4 - 3.1 %  ? RBC. 2.55 (L) 3.87 - 5.11 MIL/uL  ? Retic Count, Absolute 86.2 19.0 - 186.0 K/uL  ? Immature Retic Fract 30.0 (H) 2.3 - 15.9 %  ?Troponin I (High Sensitivity)  ?Result Value Ref Range  ? Troponin I (High Sensitivity) 4 <18 ng/L  ? ?C. Wynetta Emery, MD ?[facility] ? ? 06/23/2021  4:53 PM ?How to contact the [facility] Attending or Consulting provider [place] or covering provider during after hours [place], for this patient?  ?Check the care team in [facility] and look for a) attending/consulting TR[facility] provider listed and b) the [facility] team listed ?Log into [email or web address] and use [place]'s universal password to access. If you do not have the password, please contact the hospital operator. ?Locate the [facility] provider you are looking for under [facility] and page to a number that you can be directly reached. ?If you still have difficulty reaching the provider, please page the [facility] (Director on Call) for the Hospitalists listed on amion for assistance. ? ?

## 2021-06-24 NOTE — Evaluation (Signed)
Physical Therapy Evaluation ?Patient Details ?Name: Monique Robles ?MRN: [id number] ?DOB: 1940/07/07 ?Today's Date: 06/24/2021 ? ?History of Present Illness ? Monique Robles is a 81 y.o. female with medical history significant of CHF with preserved ejection fraction, chronic respiratory failure, dyspnea on exertion, GERD, recurrent UTIs, hypertension, hyperlipidemia, hypotension on midodrine, paroxysmal atrial fibrillation, tremor, and more presents the ED with a chief complaint of dyspnea.  Patient reports that he has had dyspnea and generalized weakness.  She cannot give a specific timeframe from the onset, but reports that it has been months.  She says it is worse than her last hospitalization, but then says that it is the same as her last hospitalization so that part of the history is unclear.  She has associated chest pain that substernal and radiating to the right and left.  Today she has new onset left arm pain.  Patient has palpitations as well.  She has a cough that is nonproductive.  Patient reports that she has been taking Eliquis since 2018 and she has had no missed doses.  She does report nausea but no vomiting on review of systems.  She reports that she was recently treated for UTI with Augmentin, so likely the nausea is related to that antibiotic.  Her last normal meal was on the same day of presentation around 1 PM. ?  ?Clinical Impression ? Patient limited for functional mobility as stated below secondary to BLE weakness, fatigue and poor standing balance. Patient O2 sat monitored throughout session on 2.5L O2 as follows, 100% at rest, 93% with any movement but quickly increases to 100% with rest. Increasing SOB with all mobility. Patient slightly lethargic with quick fatigue during session. She requires assist for mobility due to generalized weakness and balance deficits. Patient transfers and ambulates a few lateral steps at bedside with min assist and use of RW. She states 9/10 fatigue at end of  session. Patient will benefit from continued physical therapy in hospital and recommended venue below to increase strength, balance, endurance for safe ADLs and gait. ?   ?   ? ?Recommendations for follow up therapy are one component of a multi-disciplinary discharge planning process, led by the attending physician.  Recommendations may be updated based on patient status, additional functional criteria and insurance authorization. ? ?Follow Up Recommendations Skilled nursing-short term rehab (<3 hours/day) ? ?  ?Assistance Recommended at Discharge Intermittent Supervision/Assistance  ?Patient can return home with the following ? A lot of help with walking and/or transfers;A lot of help with bathing/dressing/bathroom;Assistance with cooking/housework;Help with stairs or ramp for entrance ? ?  ?Equipment Recommendations None recommended by PT  ?Recommendations for Other Services ?    ?  ?Functional Status Assessment Patient has had a recent decline in their functional status and demonstrates the ability to make significant improvements in function in a reasonable and predictable amount of time.  ? ?  ?Precautions / Restrictions Precautions ?Precautions: Fall ?Restrictions ?Weight Bearing Restrictions: No  ? ?  ? ?Mobility ? Bed Mobility ?Overal bed mobility: Needs Assistance ?Bed Mobility: Supine to Sit, Sit to Supine ?  ?  ?Supine to sit: Min assist, HOB elevated ?Sit to supine: Mod assist ?  ?General bed mobility comments: slow, labored, requires assist to pull to seated ?  ? ?Transfers ?Overall transfer level: Needs assistance ?Equipment used: Rolling walker (2 wheels) ?Transfers: Sit to/from Stand ?Sit to Stand: Min assist ?  ?  ?  ?  ?  ?General transfer comment: STS x 3 ?  ? ?  Ambulation/Gait ?Ambulation/Gait assistance: Min assist, Min guard ?Gait Distance (Feet): 2 Feet ?Assistive device: Rolling walker (2 wheels) ?  ?Gait velocity: decreased ?  ?  ?General Gait Details: lateral steps at bedside ? ?Stairs ?  ?   ?  ?  ?  ? ?Wheelchair Mobility ?  ? ?Modified Rankin (Stroke Patients Only) ?  ? ?  ? ?Balance Overall balance assessment: Needs assistance ?Sitting-balance support: Bilateral upper extremity supported, Feet supported ?Sitting balance-Leahy Scale: Good ?Sitting balance - Comments: seated EOB ?  ?Standing balance support: Bilateral upper extremity supported, Reliant on assistive device for balance ?Standing balance-Leahy Scale: Fair ?Standing balance comment: with RW ?  ?  ?  ?  ?  ?  ?  ?  ?  ?  ?  ?   ? ? ? ?Pertinent Vitals/Pain Pain Assessment ?Pain Assessment: Faces ?Faces Pain Scale: Hurts a little bit ?Pain Location: head ?Pain Descriptors / Indicators: Aching ?Pain Intervention(s): Limited activity within patient's tolerance, Monitored during session, Repositioned  ? ? ?Home Living Family/patient expects to be discharged to:: Private residence ?Living Arrangements: Children ?Available Help at Discharge: Family;Available PRN/intermittently ?Type of Home: House ?Home Access: Stairs to enter ?Entrance Stairs-Rails: Right;Left ?Entrance Stairs-Number of Steps: 7 ?Alternate Level Stairs-Number of Steps: 7 up or 7 down - split level home ?Home Layout: Two level;Able to live on main level with bedroom/bathroom ?Home Equipment: Rolling Walker (2 wheels);Grab bars - tub/shower ?   ?  ?Prior Function Prior Level of Function : Needs assist ?  ?  ?  ?  ?  ?  ?Mobility Comments: household ambulation with RW ?ADLs Comments: Family assist with ADLs ?  ? ? ?Hand Dominance  ?   ? ?  ?Extremity/Trunk Assessment  ? Upper Extremity Assessment ?Upper Extremity Assessment: Generalized weakness ?  ? ?Lower Extremity Assessment ?Lower Extremity Assessment: Generalized weakness ?  ? ?Cervical / Trunk Assessment ?Cervical / Trunk Assessment: Normal  ?Communication  ? Communication: No difficulties  ?Cognition Arousal/Alertness: Awake/alert ?Behavior During Therapy: [facility] for tasks assessed/performed ?Overall Cognitive Status: Within  Functional Limits for tasks assessed ?  ?  ?  ?  ?  ?  ?  ?  ?  ?  ?  ?  ?  ?  ?  ?  ?  ?  ?  ? ?  ?General Comments   ? ?  ?Exercises General Exercises - Lower Extremity ?Hip Flexion/Marching: AROM, Both, 10 reps, Standing  ? ?Assessment/Plan  ?  ?PT Assessment Patient needs continued PT services  ?PT Problem List Decreased strength;Decreased mobility;Decreased activity tolerance;Decreased balance;Cardiopulmonary status limiting activity ? ?   ?  ?PT Treatment Interventions DME instruction;Therapeutic exercise;Gait training;Balance training;Stair training;Neuromuscular re-education;Patient/family education;Functional mobility training;Therapeutic activities   ? ?PT Goals (Current goals can be found in the Care Plan section)  ?Acute Rehab PT Goals ?Patient Stated Goal: Return home ?PT Goal Formulation: With patient ?Time For Goal Achievement: 07/08/21 ?Potential to Achieve Goals: Fair ? ?  ?Frequency Min 3X/week ?  ? ? ?Co-evaluation   ?  ?  ?  ?  ? ? ?  ?AM-PAC PT "6 Clicks" Mobility  ?Outcome Measure Help needed turning from your back to your side while in a flat bed without using bedrails?: A Little ?Help needed moving from lying on your back to sitting on the side of a flat bed without using bedrails?: A Little ?Help needed moving to and from a bed to a chair (including a wheelchair)?: A Lot ?Help needed standing  up from a chair using your arms (e.g., wheelchair or bedside chair)?: A Little ?Help needed to walk in hospital room?: A Lot ?Help needed climbing 3-5 steps with a railing? : A Lot ?6 Click Score: 15 ? ?  ?End of Session Equipment Utilized During Treatment: Gait belt;Oxygen ?Activity Tolerance: Patient limited by fatigue ?Patient left: in bed;with call bell/phone within reach;with family/visitor present ?Nurse Communication: Mobility status ?PT Visit Diagnosis: Unsteadiness on feet (R26.81);Other abnormalities of gait and mobility (R26.89);Muscle weakness (generalized) (M62.81) ?  ? ?Time: 3754-3606 ?PT  Time Calculation (min) (ACUTE ONLY): 24 min ? ? ?Charges:   PT Evaluation ?$PT Eval Moderate Complexity: 1 Mod ?PT Treatments ?$Therapeutic Activity: 8-22 mins ?  ?   ? ? ?1:19 PM, 06/24/21 ?Dannielle Karvonen

## 2021-06-24 NOTE — Assessment & Plan Note (Addendum)
-  Patient reports extreme weakness and fatigue ?-Is actually the same intensity as her last visit on April 6 ?-Consult PT eval and treat ?-possibly due to her discovered severe B12 deficiency and iron deficiency which is being addressed ?-No signs of infection ?-Weakness is associated with the shortness of breath, and the shortness of breath could be in its natural progression given pulmonary fibrosis. ?

## 2021-06-24 NOTE — Assessment & Plan Note (Addendum)
-   Leukocytosis 13.8 on admission, now down to normal  ?-Repeat labs improved to 10 ?-Procalcitonin is undetectable ?-Chest x-ray shows chronic changes without acute process ?-CT scan shows chronic changes without any infiltrate ?-Blood culture pending ?-Recently treated for UTI, urine looks good today ?-Continue to monitor ?-possibly leukemoid reaction from steroids  ?-resolved now ?

## 2021-06-24 NOTE — Hospital Course (Addendum)
81 y.o. female with medical history significant of CHF with preserved ejection fraction, chronic respiratory failure, dyspnea on exertion, GERD, recurrent UTIs, hypertension, hyperlipidemia, hypotension on midodrine, paroxysmal atrial fibrillation, tremor, and more presents the ED with a chief complaint of dyspnea.  Patient reports that he has had dyspnea and generalized weakness.  She cannot give a specific timeframe from the onset, but reports that it has been months.  She says it is worse than her last hospitalization, but then says that it is the same as her last hospitalization so that part of the history is unclear.  She has associated chest pain that substernal and radiating to the right and left.  Today she has new onset left arm pain.  Patient has palpitations as well.  She has a cough that is nonproductive.  Patient reports that she has been taking Eliquis since 2018 and she has had no missed doses.  She does report nausea but no vomiting on review of systems.  She reports that she was recently treated for UTI with Augmentin, so likely the nausea is related to that antibiotic.  Her last normal meal was on the same day of presentation around 1 PM.  Patient has no other complaints at this time. ?  ?Patient reports we have to do something to help her because she just cannot breathe even with minimal exertion. ?  ?Patient does not smoke, does not drink alcohol, does not use illicit drugs.  She is vaccinated for COVID.  Patient is DNR. ? ?06/25/2021: We have found that she has severe low B12 level and severe low serum iron level.  She has been transfused 1 unit PRBC and started IM B12 injections.  She is starting to slowly feel better.  Pulmonologist Dr. Melvyn Novas has consulted.   ?

## 2021-06-24 NOTE — Assessment & Plan Note (Addendum)
-  Patient is on her baseline oxygen 2 L nasal cannula ?-No witnessed desaturations ?-Continue albuterol as needed ?-Continue to monitor ?-pulmonary consultation requested.  I personally reached out to Dr. Melvyn Novas and he will consult.  ?

## 2021-06-24 NOTE — Assessment & Plan Note (Addendum)
-  Continue PPI. 

## 2021-06-24 NOTE — Assessment & Plan Note (Addendum)
-   Continue Eliquis ?-CTA chest was not done on admission as patient reports no missed doses of Eliquis ?

## 2021-06-24 NOTE — H&P (Signed)
?History and Physical  ? ? ?Patient: Monique Robles NLG:[id number] DOB: October 05, 1940 ?DOA: 06/23/2021 ?DOS: the patient was seen and examined on 06/24/2021 ?PCP: Celene Squibb, MD  ?Patient coming from: Home ? ?Chief Complaint:  ?Chief Complaint  ?Patient presents with  ? Shortness of Breath  ? ?HPI: Monique Robles is a 81 y.o. female with medical history significant of CHF with preserved ejection fraction, chronic respiratory failure, dyspnea on exertion, GERD, recurrent UTIs, hypertension, hyperlipidemia, hypotension on midodrine, paroxysmal atrial fibrillation, tremor, and more presents the ED with a chief complaint of dyspnea.  Patient reports that he has had dyspnea and generalized weakness.  She cannot give a specific timeframe from the onset, but reports that it has been months.  She says it is worse than her last hospitalization, but then says that it is the same as her last hospitalization so that part of the history is unclear.  She has associated chest pain that substernal and radiating to the right and left.  Today she has new onset left arm pain.  Patient has palpitations as well.  She has a cough that is nonproductive.  Patient reports that she has been taking Eliquis since 2018 and she has had no missed doses.  She does report nausea but no vomiting on review of systems.  She reports that she was recently treated for UTI with Augmentin, so likely the nausea is related to that antibiotic.  Her last normal meal was on the same day of presentation around 1 PM.  Patient has no other complaints at this time. ? ?Patient reports we have to do something to help her because she just cannot breathe even with minimal exertion. ? ?Patient does not smoke, does not drink alcohol, does not use illicit drugs.  She is vaccinated for COVID.  Patient is DNR. ?Review of Systems: As mentioned in the history of present illness. All other systems reviewed and are negative. ?Past Medical History:  ?Diagnosis Date  ? (HFpEF) heart  failure with preserved ejection fraction ([place])   ? a. 09/2020 Echo: EF 65-70%, no rwma, GrI DD, nl RV fxn, mildly dil LA. Mild MR. Mild AS; b. 12/2020 Echo: EF 55-60%. Nl RV fxn.  ? Aortic insufficiency   ? a. 09/2020 Echo: No significant AI.  ? Chest pain   ? a. 10/2020 MV: EF 68%, no ischemia/scar.  ? Chronic respiratory failure ([place])   ? DOE (dyspnea on exertion)   ? GERD (gastroesophageal reflux disease)   ? History of pneumonia   ? History of pulmonary embolism 2001  ? History of recurrent UTIs   ? History of stroke 2018  ? HTN (hypertension)   ? Hyperlipemia   ? Hypotension   ? a. On midodrine.  ? Mitral valve regurgitation   ? a. 09/2020 Echo: Mild MR.  ? Osteoporosis   ? PAF (paroxysmal atrial fibrillation) ([place])   ? a. CHA2DS2VASc = 7-->Eliquis; b. 12/2020 Amio started-->DCCV x 1; c. 01/2021 ED eval for AF-->DCCV x 1.  ? Tremor   ? Vitamin D deficiency   ? ?Past Surgical History:  ?Procedure Laterality Date  ? BALLOON DILATION N/A 11/28/2020  ? Procedure: BALLOON DILATION;  Surgeon: Rogene Houston, MD;  Location: [facility];  Service: Endoscopy;  Laterality: N/A;  ? BLADDER SURGERY    ? x 3  ? CARDIOVERSION N/A 01/04/2021  ? Procedure: CARDIOVERSION;  Surgeon: Arnoldo Lenis, MD;  Location: [facility];  Service: Endoscopy;  Laterality: N/A;  ? CERVICAL SPINE  SURGERY    ? CHOLECYSTECTOMY    ? COLONOSCOPY    ? COLONOSCOPY WITH ESOPHAGOGASTRODUODENOSCOPY (EGD)    ? ESOPHAGOGASTRODUODENOSCOPY (EGD) WITH PROPOFOL N/A 11/28/2020  ? Procedure: ESOPHAGOGASTRODUODENOSCOPY (EGD) WITH PROPOFOL;  Surgeon: Rogene Houston, MD;  Location: [facility];  Service: Endoscopy;  Laterality: N/A;  ? LUMBAR SPINE SURGERY    ? REPLACEMENT TOTAL KNEE Left   ? RIGHT LUNG WEDGE REMOVAL    ? TONSILLECTOMY    ? ?Social History:  reports that she has never smoked. She has never used smokeless tobacco. She reports that she does not currently use alcohol. She reports that she does not use drugs. ? ?Allergies  ?Allergen Reactions  ?  Coumadin [Warfarin] Hives  ? Atorvastatin Other (See Comments)  ?  Muscle aches  ? Ciprofloxacin   ? Macrobid [Nitrofurantoin]   ?  Cause respiratory flare  ? Sulfa Antibiotics Itching and Rash  ? ? ?Family History  ?Problem Relation Age of Onset  ? Alzheimer's disease Mother   ?     died at 62  ? Cancer Mother   ? Tremor Mother   ? Pneumonia Father   ? Heart disease Father   ? Diabetes Father   ? Heart attack Father   ?     died at 107  ? ? ?Prior to Admission medications   ?Medication Sig Start Date End Date Taking? Authorizing Provider  ?acetaminophen (TYLENOL) 650 MG CR tablet Take 650 mg by mouth every 8 (eight) hours as needed for pain.    [provider]  ?apixaban (ELIQUIS) 5 MG TABS tablet Take 5 mg by mouth 2 (two) times daily.    [provider]  ?calcium carbonate (TUMS - DOSED IN MG ELEMENTAL CALCIUM) 500 MG chewable tablet Chew 1 tablet by mouth daily as needed for indigestion or heartburn.    [provider]  ?Cholecalciferol (VITAMIN D3) 1.25 MG (50000 UT) CAPS Take 1 capsule by mouth once a week. Monday at lunch 04/17/21   [provider]  ?cyclobenzaprine (FLEXERIL) 5 MG tablet Take 5 mg by mouth daily as needed. 07/31/20   [provider]  ?diphenhydrAMINE (BENADRYL) 25 MG tablet Take 25 mg by mouth daily as needed for allergies.    [provider]  ?docusate sodium (COLACE) 100 MG capsule Take 1 capsule (100 mg total) by mouth 2 (two) times daily. 01/07/21   Kathie Dike, MD  ?dronedarone (MULTAQ) 400 MG tablet Take 1 tablet (400 mg total) by mouth 2 (two) times daily with a meal. ?Patient taking differently: Take 400 mg by mouth See admin instructions. Take 1 tablet in the morning and 1 tablet at 6-630 pm with meals 03/26/21   Evans Lance, MD  ?ferrous sulfate 325 (65 FE) MG EC tablet Take 1 tablet (325 mg total) by mouth 2 (two) times daily. ?Patient not taking: Reported on 06/13/2021 01/07/21 01/07/22  Kathie Dike, MD  ?furosemide  (LASIX) 20 MG tablet Take 2 tablets (40 mg total) by mouth daily. 01/07/21   Kathie Dike, MD  ?gabapentin (NEURONTIN) 100 MG capsule Take 200 mg by mouth See admin instructions. Take 200 mg in the morning and mid day    [provider]  ?HYDROcodone-acetaminophen (NORCO/VICODIN) 5-325 MG tablet Take 1 tablet by mouth 2 (two) times daily as needed for moderate pain. 11/29/20   Amin, Jeanella Flattery, MD  ?levalbuterol (XOPENEX) 1.25 MG/0.5ML nebulizer solution Take 1.25 mg by nebulization every 6 (six) hours as needed for wheezing  or shortness of breath. 11/29/20   Amin, Jeanella Flattery, MD  ?Melatonin 10 MG TABS Take 10 mg by mouth at bedtime.    [provider]  ?midodrine (PROAMATINE) 5 MG tablet Take 1 tablet (5 mg total) by mouth 2 (two) times daily with a meal. 01/29/21   Evans Lance, MD  ?Multiple Vitamin (MULTIVITAMIN) tablet Take 1 tablet by mouth daily.    [provider]  ?omeprazole (PRILOSEC) 40 MG capsule Take 1 capsule by mouth daily. 06/21/20   [provider]  ?ondansetron (ZOFRAN) 4 MG tablet Take 4 mg by mouth every 8 (eight) hours as needed. 04/17/21   [provider]  ?pantoprazole (PROTONIX) 40 MG tablet Take 1 tablet (40 mg total) by mouth daily for 14 days. 06/16/21 06/30/21  Manuella Ghazi, Pratik D, DO  ?predniSONE (DELTASONE) 10 MG tablet Take 4 tablets (40 mg total) by mouth daily for 3 days, THEN 3 tablets (30 mg total) daily for 3 days, THEN 2 tablets (20 mg total) daily for 3 days, THEN 1 tablet (10 mg total) daily for 3 days. 06/15/21 06/27/21  Manuella Ghazi, Pratik D, DO  ?primidone (MYSOLINE) 50 MG tablet Take 50 mg by mouth 2 (two) times daily. 04/17/21   [provider]  ?sucralfate (CARAFATE) 1 g tablet Take 1 g by mouth at bedtime.    [provider]  ?trimethoprim (TRIMPEX) 100 MG tablet Take 1 tablet (100 mg total) by mouth at bedtime. 05/10/21   McKenzie, Candee Furbish, MD  ?Vibegron (GEMTESA) 75 MG TABS Take 1 capsule by mouth daily. 05/10/21    McKenzie, Candee Furbish, MD  ? ? ?Physical Exam: ?Vitals:  ? 06/24/21 0200 06/24/21 0230 06/24/21 0300 06/24/21 0330  ?BP: 103/87 (!) 94/49 (!) 100/54 (!) 100/51  ?Pulse: 60 60 (!) 58 (!) 56  ?Resp: '20 18 19 19  '$ ?Temp:

## 2021-06-25 ENCOUNTER — Observation Stay (HOSPITAL_COMMUNITY): Payer: Medicare Other

## 2021-06-25 DIAGNOSIS — R0609 Other forms of dyspnea: Secondary | ICD-10-CM

## 2021-06-25 DIAGNOSIS — Z79899 Other long term (current) drug therapy: Secondary | ICD-10-CM | POA: Diagnosis not present

## 2021-06-25 DIAGNOSIS — I340 Nonrheumatic mitral (valve) insufficiency: Secondary | ICD-10-CM | POA: Diagnosis present

## 2021-06-25 DIAGNOSIS — I110 Hypertensive heart disease with heart failure: Secondary | ICD-10-CM | POA: Diagnosis present

## 2021-06-25 DIAGNOSIS — Z20822 Contact with and (suspected) exposure to covid-19: Secondary | ICD-10-CM | POA: Diagnosis present

## 2021-06-25 DIAGNOSIS — M810 Age-related osteoporosis without current pathological fracture: Secondary | ICD-10-CM | POA: Diagnosis present

## 2021-06-25 DIAGNOSIS — Z515 Encounter for palliative care: Secondary | ICD-10-CM | POA: Diagnosis not present

## 2021-06-25 DIAGNOSIS — J84112 Idiopathic pulmonary fibrosis: Secondary | ICD-10-CM | POA: Diagnosis present

## 2021-06-25 DIAGNOSIS — E611 Iron deficiency: Secondary | ICD-10-CM | POA: Diagnosis present

## 2021-06-25 DIAGNOSIS — J449 Chronic obstructive pulmonary disease, unspecified: Secondary | ICD-10-CM | POA: Diagnosis present

## 2021-06-25 DIAGNOSIS — I5032 Chronic diastolic (congestive) heart failure: Secondary | ICD-10-CM | POA: Diagnosis present

## 2021-06-25 DIAGNOSIS — I480 Paroxysmal atrial fibrillation: Secondary | ICD-10-CM | POA: Diagnosis present

## 2021-06-25 DIAGNOSIS — Z881 Allergy status to other antibiotic agents status: Secondary | ICD-10-CM | POA: Diagnosis not present

## 2021-06-25 DIAGNOSIS — Z9981 Dependence on supplemental oxygen: Secondary | ICD-10-CM | POA: Diagnosis not present

## 2021-06-25 DIAGNOSIS — Z86711 Personal history of pulmonary embolism: Secondary | ICD-10-CM | POA: Diagnosis not present

## 2021-06-25 DIAGNOSIS — Z882 Allergy status to sulfonamides status: Secondary | ICD-10-CM | POA: Diagnosis not present

## 2021-06-25 DIAGNOSIS — E538 Deficiency of other specified B group vitamins: Secondary | ICD-10-CM | POA: Diagnosis not present

## 2021-06-25 DIAGNOSIS — Z7189 Other specified counseling: Secondary | ICD-10-CM | POA: Diagnosis not present

## 2021-06-25 DIAGNOSIS — J9611 Chronic respiratory failure with hypoxia: Secondary | ICD-10-CM | POA: Diagnosis present

## 2021-06-25 DIAGNOSIS — R531 Weakness: Secondary | ICD-10-CM | POA: Diagnosis present

## 2021-06-25 DIAGNOSIS — E872 Acidosis, unspecified: Secondary | ICD-10-CM | POA: Diagnosis present

## 2021-06-25 DIAGNOSIS — E559 Vitamin D deficiency, unspecified: Secondary | ICD-10-CM | POA: Diagnosis present

## 2021-06-25 DIAGNOSIS — Z66 Do not resuscitate: Secondary | ICD-10-CM | POA: Diagnosis present

## 2021-06-25 DIAGNOSIS — E785 Hyperlipidemia, unspecified: Secondary | ICD-10-CM | POA: Diagnosis present

## 2021-06-25 DIAGNOSIS — J9811 Atelectasis: Secondary | ICD-10-CM | POA: Diagnosis present

## 2021-06-25 DIAGNOSIS — Z888 Allergy status to other drugs, medicaments and biological substances status: Secondary | ICD-10-CM | POA: Diagnosis not present

## 2021-06-25 DIAGNOSIS — D509 Iron deficiency anemia, unspecified: Secondary | ICD-10-CM | POA: Diagnosis present

## 2021-06-25 DIAGNOSIS — K219 Gastro-esophageal reflux disease without esophagitis: Secondary | ICD-10-CM | POA: Diagnosis present

## 2021-06-25 DIAGNOSIS — Z7901 Long term (current) use of anticoagulants: Secondary | ICD-10-CM | POA: Diagnosis not present

## 2021-06-25 DIAGNOSIS — Z96652 Presence of left artificial knee joint: Secondary | ICD-10-CM | POA: Diagnosis present

## 2021-06-25 LAB — BASIC METABOLIC PANEL
Anion gap: 7 (ref 5–15)
BUN: 17 mg/dL (ref 8–23)
CO2: 25 mmol/L (ref 22–32)
Calcium: 8.8 mg/dL — ABNORMAL LOW (ref 8.9–10.3)
Chloride: 103 mmol/L (ref 98–111)
Creatinine, Ser: 0.8 mg/dL (ref 0.44–1.00)
GFR, Estimated: >60 mL/min (ref >60)
Glucose, Bld: 115 mg/dL — ABNORMAL HIGH (ref 70–99)
Potassium: 4.3 mmol/L (ref 3.5–5.1)
Sodium: 135 mmol/L (ref 135–145)

## 2021-06-25 LAB — CBC
HCT: 30.2 % — ABNORMAL LOW (ref 36.0–46.0)
Hemoglobin: 9.6 g/dL — ABNORMAL LOW (ref 12.0–15.0)
MCH: 29.8 pg (ref 26.0–34.0)
MCHC: 31.8 g/dL (ref 30.0–36.0)
MCV: 93.8 fL (ref 80.0–100.0)
Platelets: 403 10*3/uL — ABNORMAL HIGH (ref 150–400)
RBC: 3.22 MIL/uL — ABNORMAL LOW (ref 3.87–5.11)
RDW: 14.2 % (ref 11.5–15.5)
WBC: 9 10*3/uL (ref 4.0–10.5)
nRBC: 0 % (ref 0.0–0.2)

## 2021-06-25 LAB — ECHOCARDIOGRAM COMPLETE
AR max vel: 1.22 cm2
AV Area VTI: 1.45 cm2
AV Area mean vel: 1.2 cm2
AV Mean grad: 13.5 mmHg
AV Peak grad: 24.9 mmHg
Ao pk vel: 2.5 m/s
Area-P 1/2: 2.42 cm2
Calc EF: 67.4 %
Height: 61 in
MV VTI: 2.15 cm2
S' Lateral: 2.9 cm
Single Plane A2C EF: 62.8 %
Single Plane A4C EF: 67.6 %
Weight: 2747.81 oz

## 2021-06-25 LAB — T3, FREE: T3, Free: 1.6 pg/mL — ABNORMAL LOW (ref 2.0–4.4)

## 2021-06-25 LAB — SEDIMENTATION RATE: Sed Rate: 9 mm/hr (ref 0–22)

## 2021-06-25 MED ORDER — DRONEDARONE HCL 400 MG PO TABS
400.0000 mg | ORAL_TABLET | Freq: Two times a day (BID) | ORAL | Status: DC
  Administered 2021-06-25 – 2021-06-26 (×3): 400 mg via ORAL
  Filled 2021-06-25 (×6): qty 1

## 2021-06-25 MED ORDER — IPRATROPIUM-ALBUTEROL 0.5-2.5 (3) MG/3ML IN SOLN: 3.0000 mL | Freq: Four times a day (QID) | RESPIRATORY_TRACT | Status: DC | PRN

## 2021-06-25 MED ORDER — IPRATROPIUM-ALBUTEROL 0.5-2.5 (3) MG/3ML IN SOLN: 3.0000 mL | Freq: Two times a day (BID) | RESPIRATORY_TRACT | Status: DC

## 2021-06-25 NOTE — Progress Notes (Signed)
UNMATCHED BLOOD PRODUCT NOTE ? ?Compare the patient ID on the blood tag to the patient ID on the hospital armband and Blood Bank armband. Then confirm the unit number on the blood tag matches the unit number on the blood product.  If a discrepancy is discovered return the product to blood bank immediately. ? ? ?Blood Product Type: Packed Red Blood Cells ? ?Unit #: (Found on blood product bag, begins with W) [id number] ? ?Product Code #: (Found on blood product bag, begins with E) [id number] ? ? ?Start Time: 73 ? ?Starting Rate: 120 ml/hr ? ?Rate increase/decreased  (if applicable):      ml/hr ? ?Rate changed time (if applicable):  ? ? ?Stop Time: 3754 ? ? ?All Other Documentation should be documented within the Blood Admin Flowsheet per policy. ? ? ?

## 2021-06-25 NOTE — Assessment & Plan Note (Signed)
--   discovered very low B12 level which could account for a lot of her presenting symptoms ?-- we have ordered IM B12 1000 mcg x 3 daily doses  ?-- I think she is already starting to show some signs of feeling better   ?

## 2021-06-25 NOTE — Progress Notes (Signed)
*  PRELIMINARY RESULTS* ?Echocardiogram ?2D Echocardiogram has been performed. ? ?Monique Robles ?06/25/2021, 12:42 PM ?

## 2021-06-25 NOTE — Care Management Obs Status (Signed)
MEDICARE OBSERVATION STATUS NOTIFICATION ? ? ?Patient Details  ?Name: Monique Robles ?MRN: [id number] ?Date of Birth: Dec 27, 1940 ? ? ?Medicare Observation Status Notification Given:  Yes (obs done by Iona Beard LCSWA on 06/24/21) ? ? ? ?Tommy Medal ?06/25/2021, 9:32 AM ?

## 2021-06-25 NOTE — Progress Notes (Signed)
Palliative: ? ?HPI: 81 y.o. female  with past medical history of atrial fibrillation on Eliquis, chronic respiratory failure on supplemental oxygen at 2L due to pulmonary fibrosis (associated with nitrofurantoin? Amiodarone?), essential tremor, HFpEF, GERD, pulmonary embolism, stroke, hypertension, vitamin D deficiency admitted on 06/23/2021 with worsening shortness of breath and fatigue, nonproductive cough, chest pain, palpitations.  ? ?I met today with Ms. Herd but her daughter is not at bedside. Her daughter was present this morning and was able to discuss plan with Dr. Melvyn Novas. I also discussed with Dr. Melvyn Novas this morning. Reviewed with Ms. Adney plan to continue adjusting her steroids to see what she will need to keep her out of the hospital. Ms. Gieske does report that she feels much better after receiving blood transfusion and we reviewed her hemoglobin and labs. She is hopeful to pursue rehab and is motivated to increase her activity tolerance. We discussed the importance of listening to her body and not pushing too hard at the same time. Ms. Haviland expresses that she feels better about the plan forward and is more optimistic now that she is feeling better.  ? ?All questions/concerns addressed. Emotional support provided.  ? ?Exam: Alert, oriented. No distress. Lying in bed with 3L oxygen. Breathing regular, unlabored at rest. Abd soft. Moves all extremities.  ? ?Plan: ?- Hopeful for rehab stay with goal to optimize her independence to return back home.  ? ?35 min ? ?Vinie Sill, NP ?Palliative Medicine Team ?Pager [phone] (Please see [email or web address] for schedule) ?Team Phone [phone]  ? ? ?Greater than 50%  of this time was spent counseling and coordinating care related to the above assessment and plan   ?

## 2021-06-25 NOTE — Progress Notes (Signed)
?PROGRESS NOTE ? ? ?Monique Robles  NTZ:[id number] DOB: Sep 16, 1940 DOA: 06/23/2021 ?PCP: Celene Squibb, MD  ? ?Chief Complaint  ?Patient presents with  ? Shortness of Breath  ? ?Level of care: Telemetry ? ?Brief Admission History:  ?81 y.o. female with medical history significant of CHF with preserved ejection fraction, chronic respiratory failure, dyspnea on exertion, GERD, recurrent UTIs, hypertension, hyperlipidemia, hypotension on midodrine, paroxysmal atrial fibrillation, tremor, and more presents the ED with a chief complaint of dyspnea.  Patient reports that he has had dyspnea and generalized weakness.  She cannot give a specific timeframe from the onset, but reports that it has been months.  She says it is worse than her last hospitalization, but then says that it is the same as her last hospitalization so that part of the history is unclear.  She has associated chest pain that substernal and radiating to the right and left.  Today she has new onset left arm pain.  Patient has palpitations as well.  She has a cough that is nonproductive.  Patient reports that she has been taking Eliquis since 2018 and she has had no missed doses.  She does report nausea but no vomiting on review of systems.  She reports that she was recently treated for UTI with Augmentin, so likely the nausea is related to that antibiotic.  Her last normal meal was on the same day of presentation around 1 PM.  Patient has no other complaints at this time. ?  ?Patient reports we have to do something to help her because she just cannot breathe even with minimal exertion. ?  ?Patient does not smoke, does not drink alcohol, does not use illicit drugs.  She is vaccinated for COVID.  Patient is DNR. ? ?06/25/2021: We have found that she has severe low B12 level and severe low serum iron level.  She has been transfused 1 unit PRBC and started IM B12 injections.  She is starting to slowly feel better.  Pulmonologist Dr. Melvyn Novas has consulted.   ?   ?Assessment and Plan: ?* Generalized weakness ?-Patient reports extreme weakness and fatigue ?-Is actually the same intensity as her last visit on April 6 ?-Consult PT eval and treat ?-possibly due to her discovered severe B12 deficiency and iron deficiency which is being addressed ?-No signs of infection ?-Weakness is associated with the shortness of breath, and the shortness of breath could be in its natural progression given pulmonary fibrosis. ? ?B12 deficiency ?-- discovered very low B12 level which could account for a lot of her presenting symptoms ?-- we have ordered IM B12 1000 mcg x 3 daily doses  ?-- I think she is already starting to show some signs of feeling better   ? ?Iron deficiency ?-- she had not been taking her iron supplements ?-- her serum iron is very low ?-- transfused 1 unit PRBC which will give her a boost of iron ?-- started daily oral ferrous sulfate with breakfast, add vitamin C to increase iron absorption  ?-- hemoccult stools ordered  ? ?Acute exacerbation of idiopathic pulmonary fibrosis ([place]) ?-continue IV steroids as ordered ?-consult to her pulmonologist Dr. Melvyn Novas ? ?Lactic acidosis ?- Initially 2.8 and then 2.0 ?-TREATED  ? ?Leukocytosis ?- Leukocytosis 13.8 on admission, now down to normal  ?-Repeat labs improved to 10 ?-Procalcitonin is undetectable ?-Chest x-ray shows chronic changes without acute process ?-CT scan shows chronic changes without any infiltrate ?-Blood culture pending ?-Recently treated for UTI, urine looks good today ?-Continue to  monitor ?-possibly leukemoid reaction from steroids  ?-resolved now ? ?History of pulmonary embolism ?- Continue Eliquis ?-CTA chest was not done on admission as patient reports no missed doses of Eliquis ? ?Chronic respiratory failure with hypoxia ([facility]) ?-Patient is on her baseline oxygen 2 L nasal cannula ?-No witnessed desaturations ?-Continue albuterol as needed ?-Continue to monitor ?-pulmonary consultation requested.  I personally  reached out to Dr. Melvyn Novas and he will consult.  ? ?GERD (gastroesophageal reflux disease) ?Continue PPI for GI protection ? ?DVT prophylaxis: apixaban ?Code Status: DNR  ?Family Communication: daughter at bedside  ?Disposition: Status is: Inpatient ?Remains inpatient appropriate because: IV steroids required ?  ?Consultants:  ?Pulmonologist  ?Procedures:  ?PRBC 1  unit transfused 4/17 ?Antimicrobials:  ?  ?Subjective: ?Pt remains very weak and frail but a little more alert and energetic today.  No chest pain. SOB unchanged.  Pt tolerated blood transfusion.   ?Objective: ?Vitals:  ? 06/24/21 2146 06/25/21 0340 06/25/21 0408 06/25/21 0656  ?BP: (!) 116/51 112/60 121/65 (!) 141/69  ?Pulse: 63 63 67 (!) 58  ?Resp: '19 17 17 18  '$ ?Temp: 98.2 ?F (36.8 ?C) 98.7 ?F (37.1 ?C) 98.6 ?F (37 ?C) 97.7 ?F (36.5 ?C)  ?TempSrc: Oral Oral Oral Oral  ?SpO2: 100% 100% 100% 100%  ?Weight:      ?Height:      ? ? ?Intake/Output Summary (Last 24 hours) at 06/25/2021 1401 ?Last data filed at 06/25/2021 1005 ?Gross per 24 hour  ?Intake 1624.69 ml  ?Output 2200 ml  ?Net -575.31 ml  ? ?Filed Weights  ? 06/23/21 1700  ?Weight: 77.9 kg  ? ?Examination: ? ?General exam: frail, alert, sitting up in bed, Appears calm and comfortable  ?Respiratory system: very shallow breathing, hunched over in bed. ?Cardiovascular system: normal S1 & S2 heard. No JVD, murmurs, rubs, gallops or clicks. No pedal edema. ?Gastrointestinal system: Abdomen is nondistended, soft and nontender. No organomegaly or masses felt. Normal bowel sounds heard. ?Central nervous system: Alert and oriented. No focal neurological deficits. ?Extremities: diffuse body weakness in all extremities ?Skin: No rashes, lesions or ulcers. ?Psychiatry: Judgement and insight appear normal. Mood & affect appropriate.  ? ?Data Reviewed: I have personally reviewed following labs and imaging studies ? ?CBC: ?Recent Labs  ?Lab 06/23/21 ?1718 06/24/21 ?0132 06/25/21 ?0838  ?WBC 13.8* 10.0 9.0  ?NEUTROABS  12.1* 7.4  --   ?HGB 9.2* 7.2* 9.6*  ?HCT 30.1* 23.6* 30.2*  ?MCV 97.4 96.3 93.8  ?PLT 532* 406* 403*  ? ? ?Basic Metabolic Panel: ?Recent Labs  ?Lab 06/23/21 ?1718 06/24/21 ?0132 06/25/21 ?0838  ?NA 138 140 135  ?K 4.3 4.5 4.3  ?CL 108 110 103  ?CO2 20* 24 25  ?GLUCOSE 178* 102* 115*  ?BUN 30* 28* 17  ?CREATININE 1.17* 0.91 0.80  ?CALCIUM 8.6* 8.2* 8.8*  ?MG  --  2.3  --   ? ? ?CBG: ?No results for input(s): GLUCAP in the last 168 hours. ? ?Recent Results (from the past 240 hour(s))  ?Resp Panel by RT-PCR (Flu A&B, Covid) Nasopharyngeal Swab     Status: None  ? Collection Time: 06/23/21  5:21 PM  ? Specimen: Nasopharyngeal Swab; Nasopharyngeal(NP) swabs in vial transport medium  ?Result Value Ref Range Status  ? SARS Coronavirus 2 by RT PCR NEGATIVE NEGATIVE Final  ?  Comment: (NOTE) ?SARS-CoV-2 target nucleic acids are NOT DETECTED. ? ?The SARS-CoV-2 RNA is generally detectable in upper respiratory ?specimens during the acute phase of infection. The lowest ?concentration of  SARS-CoV-2 viral copies this assay can detect is ?138 copies/mL. A negative result does not preclude SARS-Cov-2 ?infection and should not be used as the sole basis for treatment or ?other patient management decisions. A negative result may occur with  ?improper specimen collection/handling, submission of specimen other ?than nasopharyngeal swab, presence of viral mutation(s) within the ?areas targeted by this assay, and inadequate number of viral ?copies(<138 copies/mL). A negative result must be combined with ?clinical observations, patient history, and epidemiological ?information. The expected result is Negative. ? ?Fact Sheet for Patients:  ?EntrepreneurPulse.com.au ? ?Fact Sheet for Healthcare Providers:  ?IncredibleEmployment.be ? ?This test is no t yet approved or cleared by the [place] FDA and  ?has been authorized for detection and/or diagnosis of SARS-CoV-2 by ?FDA under an Emergency Use  Authorization (EUA). This EUA will remain  ?in effect (meaning this test can be used) for the duration of the ?COVID-19 declaration under Section 564(b)(1) of the Act, 21 ?U.S.C.section 360bbb-3(b)(1), unless the auth

## 2021-06-25 NOTE — Consult Note (Signed)
? ?NAME:  Monique Robles, MRN:  [id number], DOB:  1940/04/26, LOS: 0 ?ADMISSION DATE:  06/23/2021, CONSULTATION DATE:  06/25/21  ?REFERRING MD:  Wynetta Emery, CHIEF COMPLAINT:  sob   ? ?History of Present Illness:  ? 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 11/21/20 with 02 dep resp failure and basline doe x across the room on 3lpm but gradually worse since d/c from SNF an last seen in my office 04/10/21 while still in SNF but able then to walk on 4lpm reporting then on amiodarone and advised to continue walking and monitor sats with ex with low threshold to d/c amiodarone ? ?Since then gradually downhill with activity tol but maint on 02 at 3lpm and daughter says sats typically in the low 90s even with ex but progressive doe to point where chair bound by admit 4/15 and I was asked to see am 4/18.  Denied assoc cough, pleuritic or ex cp/ no change mild orthopnea and no def leg swelling/ arthralgias/ myalgias or dysphagia  ? ?  ? ?Significant Hospital Events: ?Including procedures, antibiotic start and stop dates in addition to other pertinent events   ?Ct chest w/o contrast 4/17 1. Mild to moderate severity pulmonary fibrosis.  Mild bilateral lower lobe atelectasis. ? ?Scheduled Meds: ? apixaban  5 mg Oral BID  ? cyanocobalamin  1,000 mcg Intramuscular Daily  ? dronedarone  400 mg Oral BID  ? ferrous sulfate  325 mg Oral Q breakfast  ? furosemide  40 mg Oral Daily  ? gabapentin  200 mg Oral BID  ? guaiFENesin  600 mg Oral BID  ? ipratropium-albuterol  3 mL Nebulization Q6H  ? melatonin  6 mg Oral QHS  ? methylPREDNISolone (SOLU-MEDROL) injection  40 mg Intravenous Q12H  ? midodrine  5 mg Oral BID WC  ? multivitamin with minerals  1 tablet Oral Daily  ? pantoprazole  40 mg Oral Daily  ? primidone  50 mg Oral BID  ? Vibegron  1 capsule Oral QHS  ? ?Continuous Infusions: ? sodium chloride 30 mL/hr at 06/24/21 2216  ? ?PRN Meds:.acetaminophen **OR** acetaminophen, albuterol, morphine  injection, ondansetron **OR** ondansetron (ZOFRAN) IV, oxyCODONE  ? ? ? ?Interim History / Subjective:  ?Feeling better p transfusion yesterday  ? ?Objective   ?Blood pressure (!) 141/69, pulse (!) 58, temperature 97.7 ?F (36.5 ?C), temperature source Oral, resp. rate 18, height '5\' 1"'$  (1.549 m), weight 77.9 kg, SpO2 100 %. ?   ?   ? ?Intake/Output Summary (Last 24 hours) at 06/25/2021 0700 ?Last data filed at 06/25/2021 0600 ?Gross per 24 hour  ?Intake 1490.06 ml  ?Output 1400 ml  ?Net 90.06 ml  ? ?Filed Weights  ? 06/23/21 1700  ?Weight: 77.9 kg  ? ? ?Examination: ? Tmax:  98.7 ?General appearance:    chronically ill appearing elderly wf nad at 45 degrees hob   ?At Rest 02 sats  100% on 3lpm   ?No jvd ?Oropharynx clear,  mucosa nl ?Neck supple ?Lungs with insp crackles R > L base  ?RRR no s3 or or sign murmur ?Abd soft with nl  excursion  ?Extr warm with no edema or clubbing noted ?Neuro  Sensorium intact,  no apparent motor deficits  ? ? ? ?I personally reviewed images and agree with radiology impression as follows:  ? Chest CT w/o contrast 4/17 ?1. Mild to moderate severity pulmonary fibrosis. ?2. Mild bilateral lower lobe atelectasis. ?3. Marked severity coronary artery disease. ?4. Small hiatal  hernia. ?5. Aortic atherosclerosis. ? ?Cxr 06/23/21 ?No change baseline  ? ?  ? ?Assessment & Plan:  ?1) Drug induced pulmonary fibrosis/ previously on macrodantin and more recently amiodarone though dx is purely clinical  ?  ?Lab Results  ?Component Value Date  ? ESRSEDRATE 40 (H) 06/13/2021  ? ESRSEDRATE 60 (H) 01/04/2021  ? ESRSEDRATE 19 12/05/2020  ? Rec:  rechallenge with prednisone in moderate doses and follow serial levels with goal of getting down below 20 using the "lowest dose that works" strategy  ? ?3)  Anemia normocytic?  Etiology aggravating her chronic doe/ fatigue ?  ?Lab Results  ?Component Value Date  ? HGB 9.6 (L) 06/25/2021  ? HGB 7.2 (L) 06/24/2021  ? HGB 9.2 (L) 06/23/2021  ? >> approp response to  transfusion/ w/u by [facility] in progress ? ?4) DOE multifactorial  eg anemia/ deconditioning / high wob overcoming decreased Cl from PF ? ?5) 02 dep resp failure hypoxemia s hypercabia  ?>>> titrate to keep sats > 90% at all times but esp with exertion.  ? ? ?Best Practice (right click and "Reselect all SmartList Selections" daily)  ? ? Per PCP  ? ?Labs   ?CBC: ?Recent Labs  ?Lab 06/23/21 ?1718 06/24/21 ?0132  ?WBC 13.8* 10.0  ?NEUTROABS 12.1* 7.4  ?HGB 9.2* 7.2*  ?HCT 30.1* 23.6*  ?MCV 97.4 96.3  ?PLT 532* 406*  ? ? ?Basic Metabolic Panel: ?Recent Labs  ?Lab 06/23/21 ?1718 06/24/21 ?0132  ?NA 138 140  ?K 4.3 4.5  ?CL 108 110  ?CO2 20* 24  ?GLUCOSE 178* 102*  ?BUN 30* 28*  ?CREATININE 1.17* 0.91  ?CALCIUM 8.6* 8.2*  ?MG  --  2.3  ? ?GFR: ?Estimated Creatinine Clearance: 48.1 mL/min (by C-G formula based on SCr of 0.91 mg/dL). ?Recent Labs  ?Lab 06/23/21 ?1718 06/23/21 ?1739 06/23/21 ?1926 06/24/21 ?0132  ?PROCALCITON <0.10  --   --   --   ?WBC 13.8*  --   --  10.0  ?LATICACIDVEN  --  2.8* 2.0*  --   ? ? ?Liver Function Tests: ?Recent Labs  ?Lab 06/23/21 ?1718 06/24/21 ?0132  ?AST 23 15  ?ALT 13 10  ?ALKPHOS 70 53  ?BILITOT 0.4 0.3  ?PROT 7.4 6.0*  ?ALBUMIN 4.1 3.3*  ? ?No results for input(s): LIPASE, AMYLASE in the last 168 hours. ?No results for input(s): AMMONIA in the last 168 hours. ? ?ABG ?   ?Component Value Date/Time  ? PHART 7.452 (H) 11/24/2020 8299  ? PCO2ART 36.3 11/24/2020 0905  ? PO2ART 57.6 (L) 11/24/2020 0905  ? HCO3 24.0 12/31/2020 1325  ? ACIDBASEDEF 3.2 (H) 11/23/2020 0945  ? O2SAT 45.1 12/31/2020 1325  ?  ? ?Coagulation Profile: ?No results for input(s): INR, PROTIME in the last 168 hours. ? ?Cardiac Enzymes: ?No results for input(s): CKTOTAL, CKMB, CKMBINDEX, TROPONINI in the last 168 hours. ? ?HbA1C: ?No results found for: HGBA1C ? ?CBG: ?No results for input(s): GLUCAP in the last 168 hours. ? ?  ? ?Past Medical History:  ?She,  has a past medical history of (HFpEF) heart failure with preserved  ejection fraction [facility]), Aortic insufficiency, Chest pain, Chronic respiratory failure ([place]), DOE (dyspnea on exertion), GERD (gastroesophageal reflux disease), History of pneumonia, History of pulmonary embolism (2001), History of recurrent UTIs, History of stroke (2018), HTN (hypertension), Hyperlipemia, Hypotension, Mitral valve regurgitation, Osteoporosis, PAF (paroxysmal atrial fibrillation) ([place]), Tremor, and Vitamin D deficiency.  ? ?Surgical History:  ? ?Past Surgical History:  ?Procedure Laterality Date  ?  BALLOON DILATION N/A 11/28/2020  ? Procedure: BALLOON DILATION;  Surgeon: Rogene Houston, MD;  Location: [facility];  Service: Endoscopy;  Laterality: N/A;  ? BLADDER SURGERY    ? x 3  ? CARDIOVERSION N/A 01/04/2021  ? Procedure: CARDIOVERSION;  Surgeon: Arnoldo Lenis, MD;  Location: [facility] ORS;  Service: Endoscopy;  Laterality: N/A;  ? CERVICAL SPINE SURGERY    ? CHOLECYSTECTOMY    ? COLONOSCOPY    ? COLONOSCOPY WITH ESOPHAGOGASTRODUODENOSCOPY (EGD)    ? ESOPHAGOGASTRODUODENOSCOPY (EGD) WITH PROPOFOL N/A 11/28/2020  ? Procedure: ESOPHAGOGASTRODUODENOSCOPY (EGD) WITH PROPOFOL;  Surgeon: Rogene Houston, MD;  Location: [facility];  Service: Endoscopy;  Laterality: N/A;  ? LUMBAR SPINE SURGERY    ? REPLACEMENT TOTAL KNEE Left   ? RIGHT LUNG WEDGE REMOVAL    ? TONSILLECTOMY    ?  ? ?Social History:  ? reports that she has never smoked. She has never used smokeless tobacco. She reports that she does not currently use alcohol. She reports that she does not use drugs.  ? ?Family History:  ?Her family history includes Alzheimer's disease in her mother; Cancer in her mother; Diabetes in her father; Heart attack in her father; Heart disease in her father; Pneumonia in her father; Tremor in her mother.  ? ?Allergies ?Allergies  ?Allergen Reactions  ? Coumadin [Warfarin] Hives  ? Atorvastatin Other (See Comments)  ?  Muscle aches  ? Ciprofloxacin   ? Macrobid [Nitrofurantoin]   ?  Cause respiratory flare  ?  Sulfa Antibiotics Itching and Rash  ?  ? ?Home Medications  ?Prior to Admission medications   ?Medication Sig Start Date End Date Taking? Authorizing Provider  ?acetaminophen (TYLENOL) 650 MG CR tablet Take 650 mg

## 2021-06-25 NOTE — Assessment & Plan Note (Signed)
-  continue IV steroids as ordered ?-consult to her pulmonologist Dr. Melvyn Novas ?

## 2021-06-25 NOTE — TOC Progression Note (Signed)
Transition of Care (TOC) - Progression Note  ? ? ?Patient Details  ?Name: Adelfa Lozito ?MRN: [id number] ?Date of Birth: 23-Jul-1940 ? ?Transition of Care (TOC) CM/SW Contact  ?Shade Flood, LCSW ?Phone Number: ?06/25/2021, 11:11 AM ? ?Clinical Narrative:    ? ?TOC following. MD anticipating dc tomorrow. Pt/dtr accepted bed offer from [facility] SNF. CMA starting insurance authorization today. Pt's daughter expressing that she will discuss pt's dc timing with MD tomorrow as she wants to make sure pt is not discharging before she is medically stable.  ? ?TOC will follow up in AM to further assist with dc planning. ? ?Expected Discharge Plan: [place] ?Barriers to Discharge: Continued Medical Work up ? ?Expected Discharge Plan and Services ?Expected Discharge Plan: [place] ?  ?  ?  ?Living arrangements for the past 2 months: [place] ?                ?  ?  ?  ?  ?  ?  ?  ?  ?  ?  ? ? ?Social Determinants of Health (SDOH) Interventions ?  ? ?Readmission Risk Interventions ? ?  11/29/2020  ?  1:39 PM 11/28/2020  ? 10:54 AM  ?Readmission Risk Prevention Plan  ?Transportation Screening  Complete  ?PCP or Specialist Appt within 5-7 Days Complete   ?Home Care Screening  Complete  ?Medication Review (RN CM)  Complete  ? ? ?

## 2021-06-25 NOTE — Assessment & Plan Note (Signed)
--   she had not been taking her iron supplements ?-- her serum iron is very low ?-- transfused 1 unit PRBC which will give her a boost of iron ?-- started daily oral ferrous sulfate with breakfast, add vitamin C to increase iron absorption  ?-- hemoccult stools ordered  ?

## 2021-06-26 DIAGNOSIS — Z743 Need for continuous supervision: Secondary | ICD-10-CM | POA: Diagnosis not present

## 2021-06-26 DIAGNOSIS — J189 Pneumonia, unspecified organism: Secondary | ICD-10-CM | POA: Diagnosis not present

## 2021-06-26 DIAGNOSIS — J9611 Chronic respiratory failure with hypoxia: Secondary | ICD-10-CM | POA: Diagnosis not present

## 2021-06-26 DIAGNOSIS — R0609 Other forms of dyspnea: Secondary | ICD-10-CM | POA: Diagnosis not present

## 2021-06-26 DIAGNOSIS — R2689 Other abnormalities of gait and mobility: Secondary | ICD-10-CM | POA: Diagnosis not present

## 2021-06-26 DIAGNOSIS — I509 Heart failure, unspecified: Secondary | ICD-10-CM | POA: Diagnosis not present

## 2021-06-26 DIAGNOSIS — Z9981 Dependence on supplemental oxygen: Secondary | ICD-10-CM | POA: Diagnosis not present

## 2021-06-26 DIAGNOSIS — I5022 Chronic systolic (congestive) heart failure: Secondary | ICD-10-CM | POA: Diagnosis not present

## 2021-06-26 DIAGNOSIS — M6281 Muscle weakness (generalized): Secondary | ICD-10-CM | POA: Diagnosis not present

## 2021-06-26 DIAGNOSIS — J449 Chronic obstructive pulmonary disease, unspecified: Secondary | ICD-10-CM | POA: Diagnosis not present

## 2021-06-26 DIAGNOSIS — R5381 Other malaise: Secondary | ICD-10-CM | POA: Diagnosis not present

## 2021-06-26 DIAGNOSIS — K219 Gastro-esophageal reflux disease without esophagitis: Secondary | ICD-10-CM | POA: Diagnosis not present

## 2021-06-26 DIAGNOSIS — J84112 Idiopathic pulmonary fibrosis: Secondary | ICD-10-CM | POA: Diagnosis not present

## 2021-06-26 DIAGNOSIS — R531 Weakness: Secondary | ICD-10-CM | POA: Diagnosis not present

## 2021-06-26 DIAGNOSIS — Z7401 Bed confinement status: Secondary | ICD-10-CM | POA: Diagnosis not present

## 2021-06-26 DIAGNOSIS — Z8673 Personal history of transient ischemic attack (TIA), and cerebral infarction without residual deficits: Secondary | ICD-10-CM | POA: Diagnosis not present

## 2021-06-26 DIAGNOSIS — I5032 Chronic diastolic (congestive) heart failure: Secondary | ICD-10-CM | POA: Diagnosis not present

## 2021-06-26 DIAGNOSIS — J849 Interstitial pulmonary disease, unspecified: Secondary | ICD-10-CM | POA: Diagnosis not present

## 2021-06-26 LAB — BASIC METABOLIC PANEL
Anion gap: 7 (ref 5–15)
BUN: 25 mg/dL — ABNORMAL HIGH (ref 8–23)
CO2: 24 mmol/L (ref 22–32)
Calcium: 8.7 mg/dL — ABNORMAL LOW (ref 8.9–10.3)
Chloride: 103 mmol/L (ref 98–111)
Creatinine, Ser: 0.73 mg/dL (ref 0.44–1.00)
GFR, Estimated: >60 mL/min (ref >60)
Glucose, Bld: 125 mg/dL — ABNORMAL HIGH (ref 70–99)
Potassium: 3.8 mmol/L (ref 3.5–5.1)
Sodium: 134 mmol/L — ABNORMAL LOW (ref 135–145)

## 2021-06-26 LAB — CBC
HCT: 28.6 % — ABNORMAL LOW (ref 36.0–46.0)
Hemoglobin: 9.1 g/dL — ABNORMAL LOW (ref 12.0–15.0)
MCH: 29.4 pg (ref 26.0–34.0)
MCHC: 31.8 g/dL (ref 30.0–36.0)
MCV: 92.6 fL (ref 80.0–100.0)
Platelets: 413 10*3/uL — ABNORMAL HIGH (ref 150–400)
RBC: 3.09 MIL/uL — ABNORMAL LOW (ref 3.87–5.11)
RDW: 14.4 % (ref 11.5–15.5)
WBC: 11.9 10*3/uL — ABNORMAL HIGH (ref 4.0–10.5)
nRBC: 0.2 % (ref 0.0–0.2)

## 2021-06-26 MED ORDER — HYDROCODONE-ACETAMINOPHEN 5-325 MG PO TABS: 1.0000 | ORAL_TABLET | Freq: Two times a day (BID) | ORAL | 0 refills | Status: AC | PRN

## 2021-06-26 MED ORDER — MULTAQ 400 MG PO TABS: 400.0000 mg | ORAL_TABLET | Freq: Two times a day (BID) | ORAL | 3 refills | Status: DC

## 2021-06-26 MED ORDER — PANTOPRAZOLE SODIUM 40 MG PO TBEC
40.0000 mg | DELAYED_RELEASE_TABLET | Freq: Every day | ORAL | 1 refills | Status: DC
Start: 2021-06-26 — End: 2021-12-27

## 2021-06-26 MED ORDER — ACETAMINOPHEN ER 650 MG PO TBCR: 650.0000 mg | EXTENDED_RELEASE_TABLET | Freq: Three times a day (TID) | ORAL | 0 refills | Status: AC | PRN

## 2021-06-26 MED ORDER — SENNOSIDES-DOCUSATE SODIUM 8.6-50 MG PO TABS: 2.0000 | ORAL_TABLET | Freq: Two times a day (BID) | ORAL | 1 refills | Status: DC

## 2021-06-26 MED ORDER — FERROUS SULFATE 325 (65 FE) MG PO TABS: 325.0000 mg | ORAL_TABLET | Freq: Every day | ORAL | 3 refills | Status: AC

## 2021-06-26 MED ORDER — FERROUS SULFATE 325 (65 FE) MG PO TABS: ORAL_TABLET | ORAL | 3 refills | Status: DC

## 2021-06-26 MED ORDER — CYANOCOBALAMIN 1000 MCG/ML IJ SOLN: 1000.0000 ug | INTRAMUSCULAR | 1 refills | Status: AC

## 2021-06-26 MED ORDER — PREDNISONE 10 MG PO TABS: 10.0000 mg | ORAL_TABLET | ORAL | 0 refills | Status: DC

## 2021-06-26 NOTE — Progress Notes (Signed)
Called report to [facility] at [facility]. Pt is awaiting transport. ?

## 2021-06-26 NOTE — Progress Notes (Signed)
Ng Discharge Note ? ?Admit Date:  06/23/2021 ?Discharge date: 06/26/2021 ?  ?Monique Robles to be D/C'd Skilled nursing facility per MD order.  AVS completed. ?Patient/caregiver able to verbalize understanding. ? ?Discharge Medication: ?Allergies as of 06/26/2021   ? ?   Reactions  ? Coumadin [warfarin] Hives  ? Atorvastatin Other (See Comments)  ? Muscle aches  ? Ciprofloxacin   ? Macrobid [nitrofurantoin]   ? Cause respiratory flare  ? Sulfa Antibiotics Itching, Rash  ? ?  ? ?  ?Medication List  ?  ? ?STOP taking these medications   ? ?amoxicillin-clavulanate 875-125 MG tablet ?Commonly known as: AUGMENTIN ?  ?docusate sodium 100 MG capsule ?Commonly known as: COLACE ?  ?ferrous sulfate 325 (65 FE) MG EC tablet ?Replaced by: ferrous sulfate 325 (65 FE) MG tablet ?  ?omeprazole 40 MG capsule ?Commonly known as: PRILOSEC ?  ?trimethoprim 100 MG tablet ?Commonly known as: TRIMPEX ?  ? ?  ? ?TAKE these medications   ? ?acetaminophen 650 MG CR tablet ?Commonly known as: TYLENOL ?Take 1 tablet (650 mg total) by mouth every 8 (eight) hours as needed for pain. ?  ?apixaban 5 MG Tabs tablet ?Commonly known as: ELIQUIS ?Take 5 mg by mouth 2 (two) times daily. ?  ?calcium carbonate 500 MG chewable tablet ?Commonly known as: TUMS - dosed in mg elemental calcium ?Chew 1 tablet by mouth daily as needed for indigestion or heartburn. ?  ?cyanocobalamin 1000 MCG/ML injection ?Commonly known as: (VITAMIN B-12) ?Inject 1 mL (1,000 mcg total) into the muscle every 30 (thirty) days. ?  ?cyclobenzaprine 5 MG tablet ?Commonly known as: FLEXERIL ?Take 5 mg by mouth daily as needed. ?  ?diphenhydrAMINE 25 MG tablet ?Commonly known as: BENADRYL ?Take 25 mg by mouth daily as needed for allergies. ?  ?ferrous sulfate 325 (65 FE) MG tablet ?1 twice a day ?  ?ferrous sulfate 325 (65 FE) MG tablet ?Take 1 tablet (325 mg total) by mouth daily with breakfast. ?Start taking on: June 27, 2021 ?Replaces: ferrous sulfate 325 (65 FE) MG EC tablet ?   ?furosemide 20 MG tablet ?Commonly known as: LASIX ?Take 2 tablets (40 mg total) by mouth daily. ?  ?gabapentin 100 MG capsule ?Commonly known as: NEURONTIN ?Take 200 mg by mouth See admin instructions. Take 200 mg in the morning and '200mg'$  at lunch ?  ?Gemtesa 75 MG Tabs ?Generic drug: Vibegron ?Take 1 capsule by mouth daily. ?What changed: when to take this ?  ?HYDROcodone-acetaminophen 5-325 MG tablet ?Commonly known as: NORCO/VICODIN ?Take 1 tablet by mouth 2 (two) times daily as needed for moderate pain. ?  ?Melatonin 10 MG Tabs ?Take 10 mg by mouth at bedtime. ?  ?midodrine 5 MG tablet ?Commonly known as: PROAMATINE ?Take 1 tablet (5 mg total) by mouth 2 (two) times daily with a meal. ?  ?Multaq 400 MG tablet ?Generic drug: dronedarone ?Take 1 tablet (400 mg total) by mouth 2 (two) times daily. ?What changed: when to take this ?  ?multivitamin tablet ?Take 1 tablet by mouth daily. ?  ?ondansetron 4 MG tablet ?Commonly known as: ZOFRAN ?Take 4 mg by mouth every 8 (eight) hours as needed. ?  ?pantoprazole 40 MG tablet ?Commonly known as: Protonix ?Take 1 tablet (40 mg total) by mouth daily. ?  ?predniSONE 10 MG tablet ?Commonly known as: DELTASONE ?Take 1 tablet (10 mg total) by mouth See admin instructions. Take 4 Tabs ('40mg'$ ) twice daily for 4 days, then 3 tablets  ('30mg'$ )  twice daily for 4 days, then 2 tablets (20 mg) twice daily for 5 days, then 2 tablets ('20mg'$ ) once daily indefinitely until seen by pulmonology ?What changed: See the new instructions. ?  ?primidone 50 MG tablet ?Commonly known as: MYSOLINE ?Take 50 mg by mouth 2 (two) times daily. ?  ?senna-docusate 8.6-50 MG tablet ?Commonly known as: Senokot-S ?Take 2 tablets by mouth 2 (two) times daily. ?  ?sucralfate 1 g tablet ?Commonly known as: CARAFATE ?Take 1 g by mouth at bedtime. ?  ?Vitamin D3 1.25 MG (50000 UT) Caps ?Take 1 capsule by mouth once a week. Monday at lunch ?  ? ?  ? ? ?Discharge Assessment: ?Vitals:  ? 06/26/21 0603 06/26/21 1439   ?BP: 117/69 130/70  ?Pulse: (!) 53 64  ?Resp: 19   ?Temp: 98 ?F (36.7 ?C) 98.1 ?F (36.7 ?C)  ?SpO2: 100% 100%  ? Skin clean, dry and intact without evidence of skin break down, no evidence of skin tears noted. ?IV catheter discontinued intact. Site without signs and symptoms of complications - no redness or edema noted at insertion site, patient denies c/o pain - only slight tenderness at site.  Dressing with slight pressure applied. ? ?D/c Instructions-Education: ?Discharge instructions given to patient/family with verbalized understanding. ?D/c education completed with patient/family including follow up instructions, medication list, d/c activities limitations if indicated, with other d/c instructions as indicated by MD - patient able to verbalize understanding, all questions fully answered. ?Patient instructed to return to ED, call 911, or call MD for any changes in condition.  ?Patient escorted via [place], and D/C home via private auto. ? ?Tsosie Billing, RN ?06/26/2021 5:46 PM  Ng Discharge Note ? ?Admit Date:  06/23/2021 ?Discharge date: 06/26/2021 ?  ?Monique Robles to be D/C'd Skilled nursing facility per MD order.  AVS completed. ?Patient/caregiver able to verbalize understanding. ? ?Discharge Medication: ?Allergies as of 06/26/2021   ? ?   Reactions  ? Coumadin [warfarin] Hives  ? Atorvastatin Other (See Comments)  ? Muscle aches  ? Ciprofloxacin   ? Macrobid [nitrofurantoin]   ? Cause respiratory flare  ? Sulfa Antibiotics Itching, Rash  ? ?  ? ?  ?Medication List  ?  ? ?STOP taking these medications   ? ?amoxicillin-clavulanate 875-125 MG tablet ?Commonly known as: AUGMENTIN ?  ?docusate sodium 100 MG capsule ?Commonly known as: COLACE ?  ?ferrous sulfate 325 (65 FE) MG EC tablet ?Replaced by: ferrous sulfate 325 (65 FE) MG tablet ?  ?omeprazole 40 MG capsule ?Commonly known as: PRILOSEC ?  ?trimethoprim 100 MG tablet ?Commonly known as: TRIMPEX ?  ? ?  ? ?TAKE these medications   ? ?acetaminophen 650 MG CR  tablet ?Commonly known as: TYLENOL ?Take 1 tablet (650 mg total) by mouth every 8 (eight) hours as needed for pain. ?  ?apixaban 5 MG Tabs tablet ?Commonly known as: ELIQUIS ?Take 5 mg by mouth 2 (two) times daily. ?  ?calcium carbonate 500 MG chewable tablet ?Commonly known as: TUMS - dosed in mg elemental calcium ?Chew 1 tablet by mouth daily as needed for indigestion or heartburn. ?  ?cyanocobalamin 1000 MCG/ML injection ?Commonly known as: (VITAMIN B-12) ?Inject 1 mL (1,000 mcg total) into the muscle every 30 (thirty) days. ?  ?cyclobenzaprine 5 MG tablet ?Commonly known as: FLEXERIL ?Take 5 mg by mouth daily as needed. ?  ?diphenhydrAMINE 25 MG tablet ?Commonly known as: BENADRYL ?Take 25 mg by mouth daily as needed for allergies. ?  ?ferrous sulfate  325 (65 FE) MG tablet ?1 twice a day ?  ?ferrous sulfate 325 (65 FE) MG tablet ?Take 1 tablet (325 mg total) by mouth daily with breakfast. ?Start taking on: June 27, 2021 ?Replaces: ferrous sulfate 325 (65 FE) MG EC tablet ?  ?furosemide 20 MG tablet ?Commonly known as: LASIX ?Take 2 tablets (40 mg total) by mouth daily. ?  ?gabapentin 100 MG capsule ?Commonly known as: NEURONTIN ?Take 200 mg by mouth See admin instructions. Take 200 mg in the morning and '200mg'$  at lunch ?  ?Gemtesa 75 MG Tabs ?Generic drug: Vibegron ?Take 1 capsule by mouth daily. ?What changed: when to take this ?  ?HYDROcodone-acetaminophen 5-325 MG tablet ?Commonly known as: NORCO/VICODIN ?Take 1 tablet by mouth 2 (two) times daily as needed for moderate pain. ?  ?Melatonin 10 MG Tabs ?Take 10 mg by mouth at bedtime. ?  ?midodrine 5 MG tablet ?Commonly known as: PROAMATINE ?Take 1 tablet (5 mg total) by mouth 2 (two) times daily with a meal. ?  ?Multaq 400 MG tablet ?Generic drug: dronedarone ?Take 1 tablet (400 mg total) by mouth 2 (two) times daily. ?What changed: when to take this ?  ?multivitamin tablet ?Take 1 tablet by mouth daily. ?  ?ondansetron 4 MG tablet ?Commonly known as:  ZOFRAN ?Take 4 mg by mouth every 8 (eight) hours as needed. ?  ?pantoprazole 40 MG tablet ?Commonly known as: Protonix ?Take 1 tablet (40 mg total) by mouth daily. ?  ?predniSONE 10 MG tablet ?Commonly known as: DELT

## 2021-06-26 NOTE — TOC Transition Note (Signed)
Transition of Care (TOC) - CM/SW Discharge Note ? ? ?Patient Details  ?Name: Monique Robles ?MRN: [id number] ?Date of Birth: 12/23/1940 ? ?Transition of Care (TOC) CM/SW Contact:  ?Iona Beard, LCSWA ?Phone Number: ?06/26/2021, 3:29 PM ? ? ?Clinical Narrative:    ?CSW updated that pts insurance Josem Kaufmann has been approved. CSW updated facility who can accept pt today. CSW provided RN with number for report and room number. Med necessity printer to the floor for RN. Pt is on EMS list at this time. TOC signing off.  ? ?Final next level of care: [place] ?Barriers to Discharge: Barriers Resolved ? ? ?Patient Goals and CMS Choice ?Patient states their goals for this hospitalization and ongoing recovery are:: Go to SNF ?CMS Medicare.gov Compare Post Acute Care list provided to:: Patient ?Choice offered to / list presented to : Patient, Adult Children ? ?Discharge Placement ?  ?           ?Patient chooses bed at: [facility] ?Patient to be transferred to facility by: EMS ?Name of family member notified: Daughter ?Patient and family notified of of transfer: 06/26/21 ? ?Discharge Plan and Services ?  ?  ?           ?  ?  ?  ?  ?  ?  ?  ?  ?  ?  ? ?Social Determinants of Health (SDOH) Interventions ?  ? ? ?Readmission Risk Interventions ? ?  11/29/2020  ?  1:39 PM 11/28/2020  ? 10:54 AM  ?Readmission Risk Prevention Plan  ?Transportation Screening  Complete  ?PCP or Specialist Appt within 5-7 Days Complete   ?Home Care Screening  Complete  ?Medication Review (RN CM)  Complete  ? ? ? ? ? ?

## 2021-06-26 NOTE — Consult Note (Signed)
[facility] CM Inpatient Consult ? ? ?06/26/2021 ? ?Monique Robles ?1940-09-23 ?[id number] ? ?[place] Management [facility] CM) ?  ?Patient was reviewed for less than 30 days unplanned readmission. Assessed for [facility] CM post hospital chronic care management needs. Per review, current disposition is for SNF. No [facility] CM needs at this time. ? ?Of note, [facility] Care Management services does not replace or interfere with any services that are arranged by inpatient case management or social work.  ? ?Netta Cedars, MSN, RN ?[facility] Liaison ?Phone [phone] ?Toll free office [phone] ?

## 2021-06-26 NOTE — Progress Notes (Signed)
Physical Therapy Treatment ?Patient Details ?Name: Monique Robles ?MRN: [id number] ?DOB: June 02, 1940 ?Today's Date: 06/26/2021 ? ? ?History of Present Illness Monique Robles is a 81 y.o. female with medical history significant of CHF with preserved ejection fraction, chronic respiratory failure, dyspnea on exertion, GERD, recurrent UTIs, hypertension, hyperlipidemia, hypotension on midodrine, paroxysmal atrial fibrillation, tremor, and more presents the ED with a chief complaint of dyspnea.  Patient reports that he has had dyspnea and generalized weakness.  She cannot give a specific timeframe from the onset, but reports that it has been months.  She says it is worse than her last hospitalization, but then says that it is the same as her last hospitalization so that part of the history is unclear.  She has associated chest pain that substernal and radiating to the right and left.  Today she has new onset left arm pain.  Patient has palpitations as well.  She has a cough that is nonproductive.  Patient reports that she has been taking Eliquis since 2018 and she has had no missed doses.  She does report nausea but no vomiting on review of systems.  She reports that she was recently treated for UTI with Augmentin, so likely the nausea is related to that antibiotic.  Her last normal meal was on the same day of presentation around 1 PM. ? ?  ?PT Comments  ? ? Patient demonstrates slightly labored movement for sitting up at bedside, fair/good return for completing BLE ROM/strengthening exercises with verbal cues and demonstration while seated at bedside, increased endurance/distance for taking steps in room, able to transfer to/from commode in bathroom but limited mostly due to c/o SOB with SpO2 dropping from 95% to 85% while on 2 LPM, had to increase to 3 LPM to transfer back to chair and after sitting for a few minutes SpO2 increased to 98% while on 2 LPM - nurse notified.  Patient tolerated sitting up in chair after therapy.   Patient will benefit from continued skilled physical therapy in hospital and recommended venue below to increase strength, balance, endurance for safe ADLs and gait.  ? ? ?   ?Recommendations for follow up therapy are one component of a multi-disciplinary discharge planning process, led by the attending physician.  Recommendations may be updated based on patient status, additional functional criteria and insurance authorization. ? ?Follow Up Recommendations ? Skilled nursing-short term rehab (<3 hours/day) ?  ?  ?Assistance Recommended at Discharge Intermittent Supervision/Assistance  ?Patient can return home with the following A lot of help with walking and/or transfers;A lot of help with bathing/dressing/bathroom;Assistance with cooking/housework;Help with stairs or ramp for entrance ?  ?Equipment Recommendations ? None recommended by PT  ?  ?Recommendations for Other Services   ? ? ?  ?Precautions / Restrictions Precautions ?Precautions: Fall ?Restrictions ?Weight Bearing Restrictions: No  ?  ? ?Mobility ? Bed Mobility ?Overal bed mobility: Needs Assistance ?Bed Mobility: Supine to Sit ?  ?  ?Supine to sit: Min guard, Supervision, HOB elevated ?  ?  ?General bed mobility comments: slightly labored movement and HOB partially raised ?  ? ?Transfers ?Overall transfer level: Needs assistance ?Equipment used: Rolling walker (2 wheels) ?Transfers: Sit to/from Stand, Bed to chair/wheelchair/BSC ?Sit to Stand: Min assist ?  ?Step pivot transfers: Min assist ?  ?  ?  ?General transfer comment: slow labored movement with slight difficulty completing sit to stands due to BLE weakness ?  ? ?Ambulation/Gait ?Ambulation/Gait assistance: Min assist, Mod assist ?Gait Distance (Feet): 15 Feet ?Assistive  device: Rolling walker (2 wheels) ?Gait Pattern/deviations: Decreased step length - right, Decreased step length - left, Decreased stride length, Knees buckling ?Gait velocity: decreased ?  ?  ?General Gait Details: slow labored  cadence with frequent buckling of knees due to weakness and limited mostly due to SOB, fatigue and SpO2 dropping from 95% to 85% on 2 LPM ? ? ?Stairs ?  ?  ?  ?  ?  ? ? ?Wheelchair Mobility ?  ? ?Modified Rankin (Stroke Patients Only) ?  ? ? ?  ?Balance Overall balance assessment: Needs assistance ?Sitting-balance support: Feet supported, No upper extremity supported ?Sitting balance-Leahy Scale: Good ?Sitting balance - Comments: seated EOB ?  ?Standing balance support: Bilateral upper extremity supported, Reliant on assistive device for balance, During functional activity ?Standing balance-Leahy Scale: Fair ?Standing balance comment: with RW ?  ?  ?  ?  ?  ?  ?  ?  ?  ?  ?  ?  ? ?  ?Cognition Arousal/Alertness: Awake/alert ?Behavior During Therapy: [facility] for tasks assessed/performed ?Overall Cognitive Status: Within Functional Limits for tasks assessed ?  ?  ?  ?  ?  ?  ?  ?  ?  ?  ?  ?  ?  ?  ?  ?  ?  ?  ?  ? ?  ?Exercises General Exercises - Lower Extremity ?Long Arc Quad: Seated, AROM, Strengthening, Both, 10 reps ?Hip Flexion/Marching: Seated, AROM, Strengthening, Both, 10 reps ?Toe Raises: Seated, AROM, Strengthening, Both, 10 reps ?Heel Raises: Seated, AROM, Both, 10 reps, Strengthening ? ?  ?General Comments   ?  ?  ? ?Pertinent Vitals/Pain Pain Assessment ?Pain Assessment: No/denies pain  ? ? ?Home Living   ?  ?  ?  ?  ?  ?  ?  ?  ?  ?   ?  ?Prior Function    ?  ?  ?   ? ?PT Goals (current goals can now be found in the care plan section) Acute Rehab PT Goals ?Patient Stated Goal: Return home ?PT Goal Formulation: With patient ?Time For Goal Achievement: 07/08/21 ?Potential to Achieve Goals: Good ?Progress towards PT goals: Progressing toward goals ? ?  ?Frequency ? ? ? Min 3X/week ? ? ? ?  ?PT Plan Current plan remains appropriate  ? ? ?Co-evaluation   ?  ?  ?  ?  ? ?  ?AM-PAC PT "6 Clicks" Mobility   ?Outcome Measure ? Help needed turning from your back to your side while in a flat bed without using  bedrails?: A Little ?Help needed moving from lying on your back to sitting on the side of a flat bed without using bedrails?: A Little ?Help needed moving to and from a bed to a chair (including a wheelchair)?: A Lot ?Help needed standing up from a chair using your arms (e.g., wheelchair or bedside chair)?: A Little ?Help needed to walk in hospital room?: A Lot ?Help needed climbing 3-5 steps with a railing? : A Lot ?6 Click Score: 15 ? ?  ?End of Session Equipment Utilized During Treatment: Oxygen ?Activity Tolerance: Patient tolerated treatment well;Patient limited by fatigue ?Patient left: in chair;with call bell/phone within reach ?Nurse Communication: Mobility status ?PT Visit Diagnosis: Unsteadiness on feet (R26.81);Other abnormalities of gait and mobility (R26.89);Muscle weakness (generalized) (M62.81) ?  ? ? ?Time: 0109-3235 ?PT Time Calculation (min) (ACUTE ONLY): 29 min ? ?Charges:  $Therapeutic Exercise: 8-22 mins ?$Therapeutic Activity: 8-22 mins          ?          ? ?  12:08 PM, 06/26/21 ?Lonell Grandchild, MPT ?Physical Therapist with [place] [facility] ?[facility] ?[phone] office ?0600 mobile phone ? ? ?

## 2021-06-26 NOTE — Discharge Summary (Addendum)
?                                                                                ? ? ?Monique Robles, is a 81 y.o. female  DOB 1940/10/17  MRN [id number]. ? ?Admission date:  06/23/2021  Admitting Physician  Murlean Iba, MD ? ?Discharge Date:  06/26/2021  ? ?Primary MD  Celene Squibb, MD ? ?Recommendations for primary care physician for things to follow:  ? ?1)Take Prednisone 4 Tabs ('40mg'$ ) twice daily for 4 days, then 3 tablets  ('30mg'$ )  twice daily for 4 days, then 2 tablets (20 mg) twice daily for 5 days, then 2 tablets ('20mg'$ ) once daily indefinitely until seen by pulmonology ?2)Follow - up with Dr. Christinia Gully-- [place] in [place]-  ?-Address: [address] [place], [place] [address] ?Phone: [phone] OR call [phone] ?-Patient needs to be seen in the [place] office, Not in [place] ? ?3)Avoid ibuprofen/Advil/Aleve/Motrin/Goody Powders/Naproxen/BC powders/Meloxicam/Diclofenac/Indomethacin and other Nonsteroidal anti-inflammatory medications as these will make you more likely to bleed and can cause stomach ulcers, can also cause Kidney problems.  ? ?4)Please leave monthly vitamin B12 shots around the 20th of every month starting Aug 04, 2021- ? ?5)Patient is prone to Recurrent urinary tract infections--- please obtain urine sample, and culture if urinalysis meets criteria for culture--- recommend treating with short duration of appropriate antibiotics for each episode of UTI, avoid prolonged/excessive antibiotic exposure ? ?6)Please get CBC every Monday starting 07/01/2021 for the next 3 weeks--patient may need transfusions of packed red blood cells if hemoglobin continues to drop despite B12 injections and oral iron therapy-- ?-- If anemia despite above measures worsens consider referral to hematologist Dr. Delton Coombes in [place] ?7)Patient will require oxygen for ambulation--- please ambulate patient frequently for short distances at a time with 2 to 3 L of oxygen via nasal cannula while  ambulating ? ?Admission Diagnosis  Generalized weakness [R53.1] ?Fatigue, unspecified type [R53.83] ?Dyspnea, unspecified type [R06.00] ?Acute exacerbation of idiopathic pulmonary fibrosis ([place]) [J84.112] ? ? ?Discharge Diagnosis  Generalized weakness [R53.1] ?Fatigue, unspecified type [R53.83] ?Dyspnea, unspecified type [R06.00] ?Acute exacerbation of idiopathic pulmonary fibrosis ([place]) [J84.112]   ? ?Principal Problem: ?  Generalized weakness ?Active Problems: ?  GERD (gastroesophageal reflux disease) ?  Chronic respiratory failure with hypoxia (HCC) ?  History of pulmonary embolism ?  Leukocytosis ?  Lactic acidosis ?  Acute exacerbation of idiopathic pulmonary fibrosis ([place]) ?  Iron deficiency ?  B12 deficiency ?    ? ?Past Medical History:  ?Diagnosis Date  ? (HFpEF) heart failure with preserved ejection fraction ([place])   ? a. 09/2020 Echo: EF 65-70%, no rwma, GrI DD, nl RV fxn, mildly dil LA. Mild MR. Mild AS; b. 12/2020 Echo: EF 55-60%. Nl RV fxn.  ? Aortic insufficiency   ? a. 09/2020 Echo: No significant AI.  ? Chest pain   ? a. 10/2020 MV: EF 68%, no ischemia/scar.  ? Chronic respiratory failure ([place])   ? DOE (dyspnea on exertion)   ? GERD (gastroesophageal reflux disease)   ? History of pneumonia   ? History of pulmonary embolism 2001  ? History of recurrent UTIs   ? History of stroke  2018  ? HTN (hypertension)   ? Hyperlipemia   ? Hypotension   ? a. On midodrine.  ? Mitral valve regurgitation   ? a. 09/2020 Echo: Mild MR.  ? Osteoporosis   ? PAF (paroxysmal atrial fibrillation) ([place])   ? a. CHA2DS2VASc = 7-->Eliquis; b. 12/2020 Amio started-->DCCV x 1; c. 01/2021 ED eval for AF-->DCCV x 1.  ? Tremor   ? Vitamin D deficiency   ? ? ?Past Surgical History:  ?Procedure Laterality Date  ? BALLOON DILATION N/A 11/28/2020  ? Procedure: BALLOON DILATION;  Surgeon: Rogene Houston, MD;  Location: [facility];  Service: Endoscopy;  Laterality: N/A;  ? BLADDER SURGERY    ? x 3  ? CARDIOVERSION N/A 01/04/2021  ?  Procedure: CARDIOVERSION;  Surgeon: Arnoldo Lenis, MD;  Location: [facility] ORS;  Service: Endoscopy;  Laterality: N/A;  ? CERVICAL SPINE SURGERY    ? CHOLECYSTECTOMY    ? COLONOSCOPY    ? COLONOSCOPY WITH ESOPHAGOGASTRODUODENOSCOPY (EGD)    ? ESOPHAGOGASTRODUODENOSCOPY (EGD) WITH PROPOFOL N/A 11/28/2020  ? Procedure: ESOPHAGOGASTRODUODENOSCOPY (EGD) WITH PROPOFOL;  Surgeon: Rogene Houston, MD;  Location: [facility];  Service: Endoscopy;  Laterality: N/A;  ? LUMBAR SPINE SURGERY    ? REPLACEMENT TOTAL KNEE Left   ? RIGHT LUNG WEDGE REMOVAL    ? TONSILLECTOMY    ? ? ? ? ? HPI  from the history and physical done on the day of admission:  ? ?  ?HPI: Monique Robles is a 81 y.o. female with medical history significant of CHF with preserved ejection fraction, chronic respiratory failure, dyspnea on exertion, GERD, recurrent UTIs, hypertension, hyperlipidemia, hypotension on midodrine, paroxysmal atrial fibrillation, tremor, and more presents the ED with a chief complaint of dyspnea.  Patient reports that he has had dyspnea and generalized weakness.  She cannot give a specific timeframe from the onset, but reports that it has been months.  She says it is worse than her last hospitalization, but then says that it is the same as her last hospitalization so that part of the history is unclear.  She has associated chest pain that substernal and radiating to the right and left.  Today she has new onset left arm pain.  Patient has palpitations as well.  She has a cough that is nonproductive.  Patient reports that she has been taking Eliquis since 2018 and she has had no missed doses.  She does report nausea but no vomiting on review of systems.  She reports that she was recently treated for UTI with Augmentin, so likely the nausea is related to that antibiotic.  Her last normal meal was on the same day of presentation around 1 PM.  Patient has no other complaints at this time. ?  ?Patient reports we have to do something to help  her because she just cannot breathe even with minimal exertion. ?  ?Patient does not smoke, does not drink alcohol, does not use illicit drugs.  She is vaccinated for COVID.  Patient is DNR. ?Review of Systems: As mentioned in the history of present illness. All other systems reviewed and are negative. ? ? ? Hospital Course:  ? ?  ?81 y.o. female with medical history significant of CHF with preserved ejection fraction, chronic respiratory failure, dyspnea on exertion, GERD, recurrent UTIs, hypertension, hyperlipidemia, hypotension on midodrine, paroxysmal atrial fibrillation, tremor, and more presents the ED with a chief complaint of dyspnea.  Patient reports that he has had dyspnea and generalized weakness.  She cannot give a specific timeframe from the onset, but reports that it has been months.  She says it is worse than her last hospitalization, but then says that it is the same as her last hospitalization so that part of the history is unclear.  She has associated chest pain that substernal and radiating to the right and left.  Today she has new onset left arm pain.  Patient has palpitations as well.  She has a cough that is nonproductive.  Patient reports that she has been taking Eliquis since 2018 and she has had no missed doses.  She does report nausea but no vomiting on review of systems.  She reports that she was recently treated for UTI with Augmentin, so likely the nausea is related to that antibiotic.  Her last normal meal was on the same day of presentation around 1 PM.  Patient has no other complaints at this time. ?  ?Patient reports we have to do something to help her because she just cannot breathe even with minimal exertion. ?  ?Patient does not smoke, does not drink alcohol, does not use illicit drugs.  She is vaccinated for COVID.  Patient is DNR. ? ?06/25/2021: We have found that she has severe low B12 level and severe low serum iron level.  She has been transfused 1 unit PRBC and started IM B12  injections.  She is starting to slowly feel better.  Pulmonologist Dr. Melvyn Novas has consulted.   ? ?Assessment and Plan: ?1)Generalized Weakness--multifactorial ?-Patient reports significant weakness and fatigue

## 2021-06-26 NOTE — Discharge Instructions (Signed)
1)Take Prednisone 4 Tabs ('40mg'$ ) twice daily for 4 days, then 3 tablets  ('30mg'$ )  twice daily for 4 days, then 2 tablets (20 mg) twice daily for 5 days, then 2 tablets ('20mg'$ ) once daily indefinitely until seen by pulmonology ?2)Follow - up with Dr. Christinia Gully-- [place] in [place]-  ?-Address: [address], [place], [place] [address] ?Phone: [phone] OR call [phone] ?-Patient needs to be seen in the [place] office, Not in [place] ? ?3)Avoid ibuprofen/Advil/Aleve/Motrin/Goody Powders/Naproxen/BC powders/Meloxicam/Diclofenac/Indomethacin and other Nonsteroidal anti-inflammatory medications as these will make you more likely to bleed and can cause stomach ulcers, can also cause Kidney problems.  ? ?4)Please leave monthly vitamin B12 shots around the 20th of every month starting Aug 04, 2021- ? ?5)Patient is prone to Recurrent urinary tract infections--- please obtain urine sample, and culture if urinalysis meets criteria for culture--- recommend treating with short duration of appropriate antibiotics for each episode of UTI, avoid prolonged/excessive antibiotic exposure ? ?6)Please get CBC every Monday starting 07/01/2021 for the next 3 weeks--patient may need transfusions of packed red blood cells if hemoglobin continues to drop despite B12 injections and oral iron therapy-- ?-- If anemia despite above measures worsens consider referral to hematologist Dr. Delton Coombes in [place] ?7)Patient will require oxygen for ambulation--- please ambulate patient frequently for short distances at a time with 2 to 3 L of oxygen via nasal cannula while ambulating ?

## 2021-06-28 LAB — TYPE AND SCREEN
ABO/RH(D): O POS
Antibody Screen: POSITIVE
Donor AG Type: NEGATIVE
Donor AG Type: NEGATIVE
Donor AG Type: NEGATIVE
PT AG Type: NEGATIVE
Unit division: 0
Unit division: 0
Unit division: 0

## 2021-06-28 LAB — CULTURE, BLOOD (ROUTINE X 2)
Culture: NO GROWTH
Culture: NO GROWTH
Special Requests: ADEQUATE
Special Requests: ADEQUATE

## 2021-06-28 LAB — BPAM RBC
Blood Product Expiration Date: 202305132359
Blood Product Expiration Date: 202305222359
Blood Product Expiration Date: 202305262359
ISSUE DATE / TIME: 202304180332
Unit Type and Rh: 5100
Unit Type and Rh: 5100
Unit Type and Rh: 5100

## 2021-07-05 ENCOUNTER — Inpatient Hospital Stay: Payer: Medicare Other | Admitting: Internal Medicine

## 2021-07-06 DIAGNOSIS — J9611 Chronic respiratory failure with hypoxia: Secondary | ICD-10-CM | POA: Diagnosis not present

## 2021-07-06 DIAGNOSIS — J449 Chronic obstructive pulmonary disease, unspecified: Secondary | ICD-10-CM | POA: Diagnosis not present

## 2021-07-06 DIAGNOSIS — I509 Heart failure, unspecified: Secondary | ICD-10-CM | POA: Diagnosis not present

## 2021-07-08 DIAGNOSIS — J9611 Chronic respiratory failure with hypoxia: Secondary | ICD-10-CM | POA: Diagnosis not present

## 2021-07-17 ENCOUNTER — Ambulatory Visit (INDEPENDENT_AMBULATORY_CARE_PROVIDER_SITE_OTHER): Payer: Medicare Other | Admitting: Internal Medicine

## 2021-07-17 ENCOUNTER — Encounter: Payer: Self-pay | Admitting: Internal Medicine

## 2021-07-17 DIAGNOSIS — R0609 Other forms of dyspnea: Secondary | ICD-10-CM | POA: Diagnosis not present

## 2021-07-17 DIAGNOSIS — J9611 Chronic respiratory failure with hypoxia: Secondary | ICD-10-CM | POA: Diagnosis not present

## 2021-07-17 DIAGNOSIS — J189 Pneumonia, unspecified organism: Secondary | ICD-10-CM | POA: Diagnosis not present

## 2021-07-17 NOTE — Patient Instructions (Addendum)
You do NOT appear to me to be strong enough to go home unassisted at this point  ? ?Need repeat  BMET,  ESR and BNP drawn at the nursing home  ? ? ?Please schedule a follow up office visit in 6 weeks, call sooner if needed  ?

## 2021-07-17 NOTE — Assessment & Plan Note (Signed)
?  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 ESRSEDRATE 60 (H) 01/04/2021  ? ESRSEDRATE 19 12/05/2020  ? ESRSEDRATE 35 (H) 11/25/2020  ?off prednisone 02/07/21 s flare as of ov 02/21/2021  ? ?Back on prednisone with ? Amiodarone toxicity- off May 08 2021  ? ?  ?Lab Results  ?Component Value Date  ? ESRSEDRATE 9 06/25/2021  ? ESRSEDRATE 40 (H) 06/13/2021  ? ESRSEDRATE 60 (H) 01/04/2021  ?  ?The goal with a chronic steroid dependent illness is always arriving at the lowest effective dose that controls the disease/symptoms and not accepting a set "formula" which is based on statistics or guidelines that don't always take into account patient  variability or the natural hx of the dz in every individual patient, which may well vary over time.  For now therefore I recommend the patient maintain a floor of 10 mg daily as last two times she's tapered off she's abruptly deteriorated leading to admit  ? ?Rec: ?Check esr/ bnp on prednisone now at 10 mg daily  ? ?F/u here in 6 weeks, sooner if needed  ?

## 2021-07-17 NOTE — Assessment & Plan Note (Signed)
2lpm at rest 4 lpm with activity as of 02/21/2021  ?- 05/29/21  Could not maintain above 80% on 5lpm pulsed so rec D/E tanks  ? ?At rest in w/c sats fine on 3lpm  - as long as remains immobile her 02 needs will be low but needs to work with PT/OT toward d/c using whatever amt of 02 she needs with activity to keep sats > 90% / advised ? ? ?    ?  ? ?Each maintenance medication was reviewed in detail including emphasizing most importantly the difference between maintenance and prns and under what circumstances the prns are to be triggered using an action plan format where appropriate. ? ?Total time for H and P, chart review, counseling, reviewing 02  device(s) and generating customized AVS unique to this office visit / same day charting = 32 min  ?     ?

## 2021-07-17 NOTE — Progress Notes (Signed)
? ?Monique Robles, female    DOB: 15-Apr-1940   MRN: [id number] ? ? 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 ?Admit date: 11/20/2020 ?Discharge date: 11/29/2020 ?  ?Admitted From: Home ?Disposition:  SNF ?  ?Recommendations for Outpatient Follow-up:  ?Follow up with PCP in 1-2 weeks ?Follow up with Pulm, Dr Melvyn Novas or Dr Halford Chessman in 10-14 days ?Please obtain BMP/CBC and Mg in 3-5 days ?Bowel regimen prn to have atleast 1 soft BM daily  ?Supplemental O2 to keep O2 >90%. Transiently may drop with Physical therapy, ok to increase it.  ?Avoid further use of Nitrofurantoin ?Eliquis '5mg'$  po bid ?Encourage Use of Incentive spirometry and flutter valve ?Prednisone taper ordered over 2 weeks  ?  ?  ?Discharge Condition: Stable ?CODE STATUS: Full Code  ?Diet recommendation: Heart healthy ?  ?Brief/Interim Summary: ?81 year old with history of essential tremor, prior CVA, GERD, overactive bladder came to the hospital for evaluation of hypoxia and progressive shortness of breath.  Patient was diagnosed with likely bronchitis with intermittent fluid overload requiring Lasix.  There is a possibility patient may have drug-related toxicity from nitrofurantoin.  Seen by pulmonary.Hospital stay also complicated by A. fib with RVR started on Cardizem drip. Now she is on Propranolol.  Due to dysphagia she had barium swallow which showed esophageal motility disorder therefore GI plans for endoscopy 11/29/2020.  EGD showed benign stenosis requiring dilation.  Eliquis was resumed on 11/29/2020.  PT recommended SNF therefore arrangements were made.  Rest the recommendations as stated above. ?  ?  ?Assessment & Plan: ?  ?Principal Problem: ?  Acute respiratory failure with hypoxia ([place]) ?  Essential tremor ?  Cerebrovascular accident (CVA) ([place]) ?  Overactive bladder ?  GERD (gastroesophageal reflux disease) ?  Bronchitis ?  Obesity (BMI 30-39.9) ?   Insomnia ?  Acute respiratory failure ([place]) ?  Iron deficiency anemia due to chronic blood loss ?  Esophageal dysphagia ?  Chronic lung disease ?  Drug-induced pneumonitis ?  ?  ?Acute respiratory failure with hypoxia likely from nitrofurantoin pulmonary toxicity ?- Improving, she is on 2 L nasal cannula.  Due to poor lung compliance, expect transiently become hypoxic while working with physical therapy but should recover.  Okay to increase supplemental oxygen if necessary in the meantime. ?-CT of the chest was reviewed, chronic interstitial lung disease identified, negative for pulmonary embolism, possible multifocal pneumonia ?- Pulmonary recommends prednisone taper over 2 weeks and follow-up with their service in 10-14 days.  Continue bronchodilators as necessary to be used post discharge. ? ?A. fib with RVR -history of A. fib ?- Continue propranolol and Eliquis.  Cardizem PO ?  ?Dysphagia ?- Barium swallow showed esophageal motility disorder.  Endoscopy performed 9/21 showed benign esophageal stenosis and possible short segment of Barrett's esophagus.  Biopsies were performed along with dilation. ?  ?Essential tremor ?-continue propanolol ?-Stable ? ?History of CVA ?-Stable, continue Eliquis,  ?-Not on any statins ?   ?GERD ?Continue Protonix ?Stable  ? ?Overactive bladder ?Continue trospium and vibegron ?Stable  ?  ?Insomnia ?Continue melatonin.  Would avoid Benadryl or any other medications on beers list ?  ?Obesity (BMI 34.96) ?Patient will be counseled on diet and lifestyle modification when more stable ?  ?Chronic anemia -iron deficiency anemia ?- Will require outpatient colonoscopy once she is recovered from her acute illness ?  ? ? ? ?History of Present Illness  ?  12/05/2020  Pulmonary/ 1st office eval/Wilhemina Grall ? Macrodantin lung -was on it from Jan 2022 to 11/21/20  ?Chief Complaint  ?Patient presents with  ? Hospitalization Follow-up  ?Dyspnea:  baseline half way up steps, now SNF and 02 now 2lpm at rest 4-5  lpm with activity  ?Cough: minimal ?Sleep: 30 degrees electric bed  ?SABA use: multiple forms complicated by palpitations ? RAF ?Rec ?Stop the routine breathing treatments  =  albtuerol neb and inhalers ?Change the  levoalbuterol 1.25 mg vial up to every 4 hours as needed for wheeze/ coughing short of  breath ?Adjust 02 to keep sats  > 90% ? ? ? ? ? ?02/21/2021  f/u ov/[place] office/Soliana Kitko re: ? Macrodantin lung / 02 dep maint on 2lpm/ 4lpm with activity  and off prednisone end of Nov 2022 ?Chief Complaint  ?Patient presents with  ? Follow-up  ?  SOB has improved since last OV in 11/22 with Volanda Napoleon.  ? ?2LO2 cont. All the time.   ?Dyspnea:  walking to PT on 4 pronged walker ?Cough: none  ?Sleeping: bed is flat/ 2 pillows ?SABA use: none  ?02: 2lpm at rest and 4lpm with ex  ?Covid status: vax x 2 never infected  ?Rec ?No change in medications ?Use incentive spirometry as much as possible  ?Be sure to adjust 02 up if needed to maintain sats > 90% at all times  ?Please schedule a follow up office visit in 6 weeks, call sooner if needed  ?>>> defer cc of dysuria to sniff with caveat :  NO MACRODANTIN ? ? ?04/10/2021  f/u ov/[place] office/Mirela Parsley re: ? Macrodantin lung / 02 dep  maint on amiodarone now  ?Chief Complaint  ?Patient presents with  ? Follow-up  ?  Breathing has improved since last OV.  ?Pt d/c from SNF today. 2 LO2 cont.   ?Dyspnea:  walking down thall at NH with  PT with 4 pronged walker / maybe 100 ft   on 4lpm  ?Cough: none  ?Sleeping: 30 degree HOB / plans to use propped up mattress at home  ?SABA use: none ?02: 2lpm at rest and stting and 4lpm walking ?Covid status:  vax x 2  ?Rec ?Make sure you check your oxygen saturation  AT  your highest level of activity (not after you stop)   to be sure it stays over 90%   ?We will request a best fit for portable oxygen  ? ?Last amiodarone May 08 2021 stopped amiodarone with baseline room to room on 2lpm 24/7  ? ?06/13/21  admit  ? ? ?06/23/21 admit  >  21 days in  SNF  not able to walk on 3lpm  sob and weak  ?  ? ?07/17/2021  f/u ov/[place] office/Alija Riano re: PF ? Macrodantin related / steroid responsve component maint on 10 mg pred   ?Chief Complaint  ?Patient presents with  ? Follow-up  ?  2 hospital admissions in April 2023  ?Feeling better but tired.  ?Has been in SNF since.   ?Dyspnea:  comfortable at rest/ swallowing ok  ?Cough: none  ?Sleeping: 45 degrees hosp bed  ?SABA use: not using now ?02: 3lpm 24/7  ?  ? ? ?No obvious day to day or daytime variability or assoc excess/ purulent sputum or mucus plugs or hemoptysis or cp or chest tightness, subjective wheeze or overt sinus or hb symptoms.  ? ?Sleeping as above without nocturnal  or early am exacerbation  of respiratory  c/o's or need for  noct saba. Also denies any obvious fluctuation of symptoms with weather or environmental changes or other aggravating or alleviating factors except as outlined above  ? ?No unusual exposure hx or h/o childhood pna/ asthma or knowledge of premature birth. ? ?Current Allergies, Complete Past Medical History, Past Surgical History, Family History, and Social History were reviewed in [employer] record. ? ?ROS  The following are not active complaints unless bolded ?Hoarseness, sore throat, dysphagia, dental problems, itching, sneezing,  nasal congestion or discharge of excess mucus or purulent secretions, ear ache,   fever, chills, sweats, unintended wt loss or wt gain, classically pleuritic or exertional cp,  orthopnea pnd or arm/hand swelling  or leg swelling, presyncope, palpitations, abdominal pain, anorexia, nausea, vomiting, diarrhea  or change in bowel habits or change in bladder habits, change in stools or change in urine, dysuria, hematuria,  rash, arthralgias, visual complaints, headache, numbness, weakness or ataxia or problems with walking or coordination,  change in mood or  memory. ?      ? ?Current Meds  ?Medication Sig  ? acetaminophen (TYLENOL) 650  MG CR tablet Take 1 tablet (650 mg total) by mouth every 8 (eight) hours as needed for pain.  ? apixaban (ELIQUIS) 5 MG TABS tablet Take 5 mg by mouth 2 (two) times daily.  ? calcium carbonate (TUMS - DO

## 2021-07-25 ENCOUNTER — Inpatient Hospital Stay: Payer: Medicare Other | Admitting: Internal Medicine

## 2021-07-27 DIAGNOSIS — M6281 Muscle weakness (generalized): Secondary | ICD-10-CM | POA: Diagnosis not present

## 2021-07-27 DIAGNOSIS — J84112 Idiopathic pulmonary fibrosis: Secondary | ICD-10-CM | POA: Diagnosis not present

## 2021-07-27 DIAGNOSIS — R2689 Other abnormalities of gait and mobility: Secondary | ICD-10-CM | POA: Diagnosis not present

## 2021-07-28 DIAGNOSIS — I5022 Chronic systolic (congestive) heart failure: Secondary | ICD-10-CM | POA: Diagnosis not present

## 2021-07-28 DIAGNOSIS — Z8673 Personal history of transient ischemic attack (TIA), and cerebral infarction without residual deficits: Secondary | ICD-10-CM | POA: Diagnosis not present

## 2021-07-28 DIAGNOSIS — K219 Gastro-esophageal reflux disease without esophagitis: Secondary | ICD-10-CM | POA: Diagnosis not present

## 2021-07-29 DIAGNOSIS — M6281 Muscle weakness (generalized): Secondary | ICD-10-CM | POA: Diagnosis not present

## 2021-07-29 DIAGNOSIS — R2689 Other abnormalities of gait and mobility: Secondary | ICD-10-CM | POA: Diagnosis not present

## 2021-07-29 DIAGNOSIS — J84112 Idiopathic pulmonary fibrosis: Secondary | ICD-10-CM | POA: Diagnosis not present

## 2021-07-30 DIAGNOSIS — R2689 Other abnormalities of gait and mobility: Secondary | ICD-10-CM | POA: Diagnosis not present

## 2021-07-30 DIAGNOSIS — J84112 Idiopathic pulmonary fibrosis: Secondary | ICD-10-CM | POA: Diagnosis not present

## 2021-07-30 DIAGNOSIS — M6281 Muscle weakness (generalized): Secondary | ICD-10-CM | POA: Diagnosis not present

## 2021-07-31 DIAGNOSIS — R2689 Other abnormalities of gait and mobility: Secondary | ICD-10-CM | POA: Diagnosis not present

## 2021-07-31 DIAGNOSIS — J84112 Idiopathic pulmonary fibrosis: Secondary | ICD-10-CM | POA: Diagnosis not present

## 2021-07-31 DIAGNOSIS — M6281 Muscle weakness (generalized): Secondary | ICD-10-CM | POA: Diagnosis not present

## 2021-08-01 DIAGNOSIS — J84112 Idiopathic pulmonary fibrosis: Secondary | ICD-10-CM | POA: Diagnosis not present

## 2021-08-01 DIAGNOSIS — M6281 Muscle weakness (generalized): Secondary | ICD-10-CM | POA: Diagnosis not present

## 2021-08-01 DIAGNOSIS — R2689 Other abnormalities of gait and mobility: Secondary | ICD-10-CM | POA: Diagnosis not present

## 2021-08-02 DIAGNOSIS — M6281 Muscle weakness (generalized): Secondary | ICD-10-CM | POA: Diagnosis not present

## 2021-08-02 DIAGNOSIS — J84112 Idiopathic pulmonary fibrosis: Secondary | ICD-10-CM | POA: Diagnosis not present

## 2021-08-02 DIAGNOSIS — R2689 Other abnormalities of gait and mobility: Secondary | ICD-10-CM | POA: Diagnosis not present

## 2021-08-05 DIAGNOSIS — R2689 Other abnormalities of gait and mobility: Secondary | ICD-10-CM | POA: Diagnosis not present

## 2021-08-05 DIAGNOSIS — J84112 Idiopathic pulmonary fibrosis: Secondary | ICD-10-CM | POA: Diagnosis not present

## 2021-08-05 DIAGNOSIS — M6281 Muscle weakness (generalized): Secondary | ICD-10-CM | POA: Diagnosis not present

## 2021-08-05 NOTE — Progress Notes (Deleted)
Referring Provider: Celene Squibb, MD Primary Care Physician:  Celene Squibb, MD Primary GI Physician: Dr. Abbey Chatters  No chief complaint on file.   HPI:   Monique Robles is a 81 y.o. female presenting today with a history of   We saw patient during hospitalization September 2022 for dysphagia.  She was admitted for acute respiratory failure in the setting of acute on chronic interstitial lung disease, pneumonia, possible drug toxicity from chronic nitrofurantoin.  She reported dysphagia which was chronic and she was found to have mild dilation of proximal and midesophagus as well as questionable mild thickening in the mid/distal esophagus on CTA chest.  Speech evaluation was negative for aspiration.  Barium swallow showed diffuse esophageal dysmotility without mass, stricture, wall irregularity, and barium tablet passed without obstruction.  EGD 9/21 with mildly dilated esophagus, salmon-colored mucosa suspicious for Barrett's esophagus s/p biopsy, noncritical narrowing at GE junction s/p dilation, moderate to large sliding HH.  Also noted iron deficiency anemia with heme positive stool.  Hemoglobin remained stable in the 9-10 range during admission and she had no overt GI bleeding.  Recommended outpatient colonoscopy.  She has been admitted to the hospital few times since we saw her last: Admitted in October 2022 with recurrent atrial fibrillation with RVR, associated acute on chronic hypoxemic respiratory failure in the setting of CHF.  Also with UTI.  She underwent cardioversion on 10/28 and was diuresed with Lasix.  Also treated with a course of antibiotics and started on steroids for ILD with question of underlying infection. Admitted in early April with acute on chronic hypoxemic respiratory failure secondary to pulmonary fibrosis.  She was treated with IV steroids. Readmitted in mid April with flare of interstitial lung disease, symptomatic anemia.  She was treated with IV steroids with plans  for outpatient oral taper and follow-up with Dr. Melvyn Novas.  Hemoglobin was as low as 7.2.  She is also found to have low B12 and low iron.  She was transfused 1 unit PRBCs and started on IM B12 injections.  Following PRBC transfusion, hemoglobin remained stable in the 9 range.  Follow-up with Dr. Melvyn Novas on 07/17/21.  She was residing at [facility].  She was using 3 L supplemental O2 at rest.  Recommended working with PT/OT towards discharge using what ever amount of oxygen she needed with activity to keep saturations up above 90%.  Also recommended continuing prednisone 10 mg daily and follow-up in 6 weeks.  Today:   Dysphagia:   GERD:  IDA:    Last colonoscopy in [place] in the last 10 years with normal exam, per patient.   Past Medical History:  Diagnosis Date   (HFpEF) heart failure with preserved ejection fraction ([place])    a. 09/2020 Echo: EF 65-70%, no rwma, GrI DD, nl RV fxn, mildly dil LA. Mild MR. Mild AS; b. 12/2020 Echo: EF 55-60%. Nl RV fxn.   Aortic insufficiency    a. 09/2020 Echo: No significant AI.   Chest pain    a. 10/2020 MV: EF 68%, no ischemia/scar.   Chronic respiratory failure ([facility])    DOE (dyspnea on exertion)    GERD (gastroesophageal reflux disease)    History of pneumonia    History of pulmonary embolism 2001   History of recurrent UTIs    History of stroke 2018   HTN (hypertension)    Hyperlipemia    Hypotension    a. On midodrine.   Mitral valve regurgitation    a. 09/2020 Echo:  Mild MR.   Osteoporosis    PAF (paroxysmal atrial fibrillation) ([place])    a. CHA2DS2VASc = 7-->Eliquis; b. 12/2020 Amio started-->DCCV x 1; c. 01/2021 ED eval for AF-->DCCV x 1.   Tremor    Vitamin D deficiency     Past Surgical History:  Procedure Laterality Date   BALLOON DILATION N/A 11/28/2020   Procedure: BALLOON DILATION;  Surgeon: Rogene Houston, MD;  [facility];  Service: Endoscopy;  Laterality: N/A;   BLADDER SURGERY     x 3   CARDIOVERSION N/A 01/04/2021    Procedure: CARDIOVERSION;  Surgeon: Arnoldo Lenis, MD;  Location: [facility] ORS;  Service: Endoscopy;  Laterality: N/A;   CERVICAL SPINE SURGERY     CHOLECYSTECTOMY     COLONOSCOPY     COLONOSCOPY WITH ESOPHAGOGASTRODUODENOSCOPY (EGD)     ESOPHAGOGASTRODUODENOSCOPY (EGD) WITH PROPOFOL N/A 11/28/2020   Procedure: ESOPHAGOGASTRODUODENOSCOPY (EGD) WITH PROPOFOL;  Surgeon: Rogene Houston, MD;  Location: [facility];  Service: Endoscopy;  Laterality: N/A;   LUMBAR SPINE SURGERY     REPLACEMENT TOTAL KNEE Left    RIGHT LUNG WEDGE REMOVAL     TONSILLECTOMY      Current Outpatient Medications  Medication Sig Dispense Refill   acetaminophen (TYLENOL) 650 MG CR tablet Take 1 tablet (650 mg total) by mouth every 8 (eight) hours as needed for pain. 12 tablet 0   apixaban (ELIQUIS) 5 MG TABS tablet Take 5 mg by mouth 2 (two) times daily.     calcium carbonate (TUMS - DOSED IN MG ELEMENTAL CALCIUM) 500 MG chewable tablet Chew 1 tablet by mouth daily as needed for indigestion or heartburn.     Cholecalciferol (VITAMIN D3) 1.25 MG (50000 UT) CAPS Take 1 capsule by mouth once a week. Monday at lunch     cyanocobalamin (,VITAMIN B-12,) 1000 MCG/ML injection Inject 1 mL (1,000 mcg total) into the muscle every 30 (thirty) days. 12 mL 1   cyclobenzaprine (FLEXERIL) 5 MG tablet Take 5 mg by mouth daily as needed.     diphenhydrAMINE (BENADRYL) 25 MG tablet Take 25 mg by mouth daily as needed for allergies.     ferrous sulfate 325 (65 FE) MG tablet 1 twice a day 60 tablet 3   ferrous sulfate 325 (65 FE) MG tablet Take 1 tablet (325 mg total) by mouth daily with breakfast. 90 tablet 3   furosemide (LASIX) 20 MG tablet Take 2 tablets (40 mg total) by mouth daily. 30 tablet    gabapentin (NEURONTIN) 100 MG capsule Take 200 mg by mouth See admin instructions. Take 200 mg in the morning and '200mg'$  at lunch     HYDROcodone-acetaminophen (NORCO/VICODIN) 5-325 MG tablet Take 1 tablet by mouth 2 (two) times daily as  needed for moderate pain. 15 tablet 0   Melatonin 10 MG TABS Take 10 mg by mouth at bedtime.     midodrine (PROAMATINE) 5 MG tablet Take 1 tablet (5 mg total) by mouth 2 (two) times daily with a meal. 60 tablet 11   Multiple Vitamin (MULTIVITAMIN) tablet Take 1 tablet by mouth daily.     ondansetron (ZOFRAN) 4 MG tablet Take 4 mg by mouth every 8 (eight) hours as needed.     pantoprazole (PROTONIX) 40 MG tablet Take 1 tablet (40 mg total) by mouth daily. 30 tablet 1   predniSONE (DELTASONE) 10 MG tablet Take 1 tablet (10 mg total) by mouth See admin instructions. Take 4 Tabs ('40mg'$ ) twice daily for 4  days, then 3 tablets  ('30mg'$ )  twice daily for 4 days, then 2 tablets (20 mg) twice daily for 5 days, then 2 tablets ('20mg'$ ) once daily indefinitely until seen by pulmonology 30 tablet 0   primidone (MYSOLINE) 50 MG tablet Take 50 mg by mouth 2 (two) times daily.     senna-docusate (SENOKOT-S) 8.6-50 MG tablet Take 2 tablets by mouth 2 (two) times daily. 120 tablet 1   sucralfate (CARAFATE) 1 g tablet Take 1 g by mouth at bedtime.     trimethoprim (TRIMPEX) 100 MG tablet Take 100 mg by mouth 2 (two) times daily.     Vibegron (GEMTESA) 75 MG TABS Take 1 capsule by mouth daily. (Patient taking differently: Take 1 capsule by mouth at bedtime.) 30 tablet 11   No current facility-administered medications for this visit.    Allergies as of 08/07/2021 - Review Complete 07/17/2021  Allergen Reaction Noted   Coumadin [warfarin] Hives 08/15/2019   Atorvastatin Other (See Comments) 08/15/2019   Ciprofloxacin  08/24/2020   Macrobid [nitrofurantoin]     Sulfa antibiotics Itching and Rash 08/11/2019    Family History  Problem Relation Age of Onset   Alzheimer's disease Mother        died at 5   Cancer Mother    Tremor Mother    Pneumonia Father    Heart disease Father    Diabetes Father    Heart attack Father        died at 69    Social History   Socioeconomic History   Marital status: Widowed     Spouse name: Not on file   Number of children: 3   Years of education: 12   Highest education level: High school graduate  Occupational History   Occupation: Retired  Tobacco Use   Smoking status: Never   Smokeless tobacco: Never  Vaping Use   Vaping Use: Never used  Substance and Sexual Activity   Alcohol use: Not Currently   Drug use: Never   Sexual activity: Not on file  Other Topics Concern   Not on file  Social History Narrative   Lives with her daughter.   Right-handed.   Rare caffeine use.   Social Determinants of Health   Financial Resource Strain: Not on file  Food Insecurity: Not on file  Transportation Needs: Not on file  Physical Activity: Not on file  Stress: Not on file  Social Connections: Not on file    Review of Systems: Gen: Denies fever, chills, cold or flulike symptoms, presyncope, syncope. CV: Denies chest pain, palpitations. Resp: Denies dyspnea, cough. GI: See HPI Heme: See HPI  Physical Exam: There were no vitals taken for this visit. General:   Alert and oriented. No distress noted. Pleasant and cooperative.  Head:  Normocephalic and atraumatic. Eyes:  Conjuctiva clear without scleral icterus. Heart:  S1, S2 present without murmurs appreciated. Lungs:  Clear to auscultation bilaterally. No wheezes, rales, or rhonchi. No distress.  Abdomen:  +BS, soft, non-tender and non-distended. No rebound or guarding. No HSM or masses noted. Msk:  Symmetrical without gross deformities. Normal posture. Extremities:  Without edema. Neurologic:  Alert and  oriented x4 Psych:  Normal mood and affect.    Assessment:     Plan:  ***   Aliene Altes, PA-C [facility] 08/07/2021

## 2021-08-06 DIAGNOSIS — M6281 Muscle weakness (generalized): Secondary | ICD-10-CM | POA: Diagnosis not present

## 2021-08-06 DIAGNOSIS — J84112 Idiopathic pulmonary fibrosis: Secondary | ICD-10-CM | POA: Diagnosis not present

## 2021-08-06 DIAGNOSIS — R2689 Other abnormalities of gait and mobility: Secondary | ICD-10-CM | POA: Diagnosis not present

## 2021-08-07 ENCOUNTER — Ambulatory Visit: Payer: Medicare Other | Admitting: Gastroenterology

## 2021-08-07 DIAGNOSIS — R2689 Other abnormalities of gait and mobility: Secondary | ICD-10-CM | POA: Diagnosis not present

## 2021-08-07 DIAGNOSIS — J84112 Idiopathic pulmonary fibrosis: Secondary | ICD-10-CM | POA: Diagnosis not present

## 2021-08-07 DIAGNOSIS — M6281 Muscle weakness (generalized): Secondary | ICD-10-CM | POA: Diagnosis not present

## 2021-08-08 DIAGNOSIS — M6281 Muscle weakness (generalized): Secondary | ICD-10-CM | POA: Diagnosis not present

## 2021-08-08 DIAGNOSIS — R2689 Other abnormalities of gait and mobility: Secondary | ICD-10-CM | POA: Diagnosis not present

## 2021-08-08 DIAGNOSIS — J84112 Idiopathic pulmonary fibrosis: Secondary | ICD-10-CM | POA: Diagnosis not present

## 2021-08-08 DIAGNOSIS — I509 Heart failure, unspecified: Secondary | ICD-10-CM | POA: Diagnosis not present

## 2021-08-08 DIAGNOSIS — J449 Chronic obstructive pulmonary disease, unspecified: Secondary | ICD-10-CM | POA: Diagnosis not present

## 2021-08-08 DIAGNOSIS — J9611 Chronic respiratory failure with hypoxia: Secondary | ICD-10-CM | POA: Diagnosis not present

## 2021-08-09 DIAGNOSIS — J84112 Idiopathic pulmonary fibrosis: Secondary | ICD-10-CM | POA: Diagnosis not present

## 2021-08-09 DIAGNOSIS — M6281 Muscle weakness (generalized): Secondary | ICD-10-CM | POA: Diagnosis not present

## 2021-08-09 DIAGNOSIS — R2689 Other abnormalities of gait and mobility: Secondary | ICD-10-CM | POA: Diagnosis not present

## 2021-08-11 DIAGNOSIS — R2689 Other abnormalities of gait and mobility: Secondary | ICD-10-CM | POA: Diagnosis not present

## 2021-08-11 DIAGNOSIS — J84112 Idiopathic pulmonary fibrosis: Secondary | ICD-10-CM | POA: Diagnosis not present

## 2021-08-11 DIAGNOSIS — M6281 Muscle weakness (generalized): Secondary | ICD-10-CM | POA: Diagnosis not present

## 2021-08-12 ENCOUNTER — Ambulatory Visit: Payer: Medicare Other | Admitting: Physician Assistant

## 2021-08-12 DIAGNOSIS — M6281 Muscle weakness (generalized): Secondary | ICD-10-CM | POA: Diagnosis not present

## 2021-08-12 DIAGNOSIS — J84112 Idiopathic pulmonary fibrosis: Secondary | ICD-10-CM | POA: Diagnosis not present

## 2021-08-12 DIAGNOSIS — R2689 Other abnormalities of gait and mobility: Secondary | ICD-10-CM | POA: Diagnosis not present

## 2021-08-13 ENCOUNTER — Ambulatory Visit: Payer: Medicare Other | Admitting: Physician Assistant

## 2021-08-13 VITALS — BP 115/69 | HR 76

## 2021-08-13 DIAGNOSIS — M6281 Muscle weakness (generalized): Secondary | ICD-10-CM | POA: Diagnosis not present

## 2021-08-13 DIAGNOSIS — N3281 Overactive bladder: Secondary | ICD-10-CM

## 2021-08-13 DIAGNOSIS — J84112 Idiopathic pulmonary fibrosis: Secondary | ICD-10-CM | POA: Diagnosis not present

## 2021-08-13 DIAGNOSIS — Z87440 Personal history of urinary (tract) infections: Secondary | ICD-10-CM | POA: Diagnosis not present

## 2021-08-13 DIAGNOSIS — N3000 Acute cystitis without hematuria: Secondary | ICD-10-CM | POA: Diagnosis not present

## 2021-08-13 DIAGNOSIS — R2689 Other abnormalities of gait and mobility: Secondary | ICD-10-CM | POA: Diagnosis not present

## 2021-08-13 LAB — URINALYSIS, ROUTINE W REFLEX MICROSCOPIC
Bilirubin, UA: NEGATIVE
Glucose, UA: NEGATIVE
Ketones, UA: NEGATIVE
Nitrite, UA: NEGATIVE
Protein,UA: NEGATIVE
RBC, UA: NEGATIVE
Specific Gravity, UA: 1.02 (ref 1.005–1.030)
Urobilinogen, Ur: 0.2 mg/dL (ref 0.2–1.0)
pH, UA: 7 (ref 5.0–7.5)

## 2021-08-13 LAB — MICROSCOPIC EXAMINATION
RBC, Urine: NONE SEEN /hpf (ref 0–2)
Renal Epithel, UA: NONE SEEN /hpf

## 2021-08-13 LAB — BLADDER SCAN AMB NON-IMAGING: Scan Result: 97

## 2021-08-13 MED ORDER — AMOXICILLIN-POT CLAVULANATE 875-125 MG PO TABS: 1.0000 | ORAL_TABLET | Freq: Two times a day (BID) | ORAL | 0 refills | Status: DC

## 2021-08-13 NOTE — Progress Notes (Signed)
Assessment: 1. Overactive bladder - Urinalysis, Routine w reflex microscopic - BLADDER SCAN AMB NON-IMAGING  2. Acute cystitis without hematuria  3. History of recurrent UTIs    Plan: MDX culture obtained and prescription for Augmentin sent to the patient's pharmacy.  We will adjust therapy pending results.  Patient is advised to hold her at bedtime dose of trimethoprim until completion of the Augmentin.  Follow-up in 3 to 4 weeks for UA and PVR.  Chief Complaint: No chief complaint on file.   HPI: Monique Robles is a 81 y.o. female who presents for continued evaluation of OAB and UTI. Pt was hospitalized in April for acute respiratory failure and is now on 2 to 3 L of oxygen anytime she is active or ambulates.  UA obtained during hospitalist visit was negative for infection.  Monique Robles continues to work well, but she is recently noted an increase in frequency.  No gross hematuria, fever, nausea or vomiting. UA= 6-10WBC, many bacteria PVR=84m  05/10/21 Monique Robles a 89yohere for followup for recurrent UTI and OAB. She is doing well on gemtesa '75mg'$  daily and has only mild urgency and occasional mild urge incontinence. For the past 3-4 months she has noted an increased frequency of her UTIs. She has been diagnosed with 4 in the past 3 months. She was previously on macrobid but had to stop if for pulmonary issues. UA today is concerning for infection. No other complaitns today    Portions of the above documentation were copied from a prior visit for review purposes only.  Allergies: Allergies  Allergen Reactions   Coumadin [Warfarin] Hives   Atorvastatin Other (See Comments)    Muscle aches   Ciprofloxacin    Macrobid [Nitrofurantoin]     Cause respiratory flare   Sulfa Antibiotics Itching and Rash    PMH: Past Medical History:  Diagnosis Date   (HFpEF) heart failure with preserved ejection fraction ([place]    a. 09/2020 Echo: EF 65-70%, no rwma, GrI DD, nl RV fxn, mildly dil  LA. Mild MR. Mild AS; b. 12/2020 Echo: EF 55-60%. Nl RV fxn.   Aortic insufficiency    a. 09/2020 Echo: No significant AI.   Chest pain    a. 10/2020 MV: EF 68%, no ischemia/scar.   Chronic respiratory failure (HCC)    DOE (dyspnea on exertion)    GERD (gastroesophageal reflux disease)    History of pneumonia    History of pulmonary embolism 2001   History of recurrent UTIs    History of stroke 2018   HTN (hypertension)    Hyperlipemia    Hypotension    a. On midodrine.   Mitral valve regurgitation    a. 09/2020 Echo: Mild MR.   Osteoporosis    PAF (paroxysmal atrial fibrillation) ([place]    a. CHA2DS2VASc = 7-->Eliquis; b. 12/2020 Amio started-->DCCV x 1; c. 01/2021 ED eval for AF-->DCCV x 1.   Tremor    Vitamin D deficiency     PSH: Past Surgical History:  Procedure Laterality Date   BALLOON DILATION N/A 11/28/2020   Procedure: BALLOON DILATION;  Surgeon: RRogene Houston MD;  [facility];  Service: Endoscopy;  Laterality: N/A;   BLADDER SURGERY     x 3   CARDIOVERSION N/A 01/04/2021   Procedure: CARDIOVERSION;  Surgeon: BArnoldo Lenis MD;  Location: [facility];  Service: Endoscopy;  Laterality: N/A;   CERVICAL SPINE SURGERY     CHOLECYSTECTOMY     COLONOSCOPY  COLONOSCOPY WITH ESOPHAGOGASTRODUODENOSCOPY (EGD)     ESOPHAGOGASTRODUODENOSCOPY (EGD) WITH PROPOFOL N/A 11/28/2020   Procedure: ESOPHAGOGASTRODUODENOSCOPY (EGD) WITH PROPOFOL;  Surgeon: Rogene Houston, MD;  Location[facility];  Service: Endoscopy;  Laterality: N/A;   LUMBAR SPINE SURGERY     REPLACEMENT TOTAL KNEE Left    RIGHT LUNG WEDGE REMOVAL     TONSILLECTOMY      SH: Social History   Tobacco Use   Smoking status: Never   Smokeless tobacco: Never  Vaping Use   Vaping Use: Never used  Substance Use Topics   Alcohol use: Not Currently   Drug use: Never    ROS: See HPI                 PE: BP 115/69   Pulse 76  GENERAL APPEARANCE:  Well appearing, well developed, well  nourished, NAD HEENT:  Atraumatic, normocephalic NECK:  Supple. Trachea midline ABDOMEN:  Soft, non-tender, no masses NEUROLOGIC:  Alert and oriented x 3 MENTAL STATUS:  appropriate BACK:  Non-tender to palpation, No CVAT SKIN:  Warm, dry, and intact  Results: Laboratory Data: Lab Results  Component Value Date   WBC 11.9 (H) 06/26/2021   HGB 9.1 (L) 06/26/2021   HCT 28.6 (L) 06/26/2021   MCV 92.6 06/26/2021   PLT 413 (H) 06/26/2021    Lab Results  Component Value Date   CREATININE 0.73 06/26/2021     Urinalysis    Component Value Date/Time   COLORURINE AMBER (A) 06/23/2021 1718   APPEARANCEUR CLEAR 06/23/2021 1718   APPEARANCEUR Hazy (A) 05/10/2021 1029   LABSPEC 1.014 06/23/2021 1718   PHURINE 5.0 06/23/2021 1718   GLUCOSEU NEGATIVE 06/23/2021 1718   HGBUR MODERATE (A) 06/23/2021 1718   BILIRUBINUR NEGATIVE 06/23/2021 1718   BILIRUBINUR Negative 05/10/2021 1029   KETONESUR NEGATIVE 06/23/2021 1718   PROTEINUR NEGATIVE 06/23/2021 1718   NITRITE NEGATIVE 06/23/2021 1718   LEUKOCYTESUR NEGATIVE 06/23/2021 1718    Lab Results  Component Value Date   LABMICR See below: 05/10/2021   WBCUA 2+ (A) 05/10/2021   LABEPIT 0-10 05/10/2021   BACTERIA NONE SEEN 06/23/2021    Pertinent Imaging:  No results found for this or any previous visit.  No results found for this or any previous visit.  No results found for this or any previous visit.  No results found for this or any previous visit.  No results found for this or any previous visit.  No results found for this or any previous visit.  No results found for this or any previous visit.  No results found for this or any previous visit.  No results found for this or any previous visit (from the past 24 hour(s)).

## 2021-08-14 DIAGNOSIS — R2689 Other abnormalities of gait and mobility: Secondary | ICD-10-CM | POA: Diagnosis not present

## 2021-08-14 DIAGNOSIS — J84112 Idiopathic pulmonary fibrosis: Secondary | ICD-10-CM | POA: Diagnosis not present

## 2021-08-14 DIAGNOSIS — M6281 Muscle weakness (generalized): Secondary | ICD-10-CM | POA: Diagnosis not present

## 2021-08-15 ENCOUNTER — Telehealth: Payer: Self-pay

## 2021-08-15 ENCOUNTER — Other Ambulatory Visit: Payer: Self-pay | Admitting: Physician Assistant

## 2021-08-15 DIAGNOSIS — J84112 Idiopathic pulmonary fibrosis: Secondary | ICD-10-CM | POA: Diagnosis not present

## 2021-08-15 DIAGNOSIS — R2689 Other abnormalities of gait and mobility: Secondary | ICD-10-CM | POA: Diagnosis not present

## 2021-08-15 DIAGNOSIS — M6281 Muscle weakness (generalized): Secondary | ICD-10-CM | POA: Diagnosis not present

## 2021-08-15 NOTE — Telephone Encounter (Signed)
-----   Message from Monique Robles, [place] sent at 08/15/2021  4:41 PM EDT ----- Please let pt know her cx indicates Augmentin covers the UTI well.

## 2021-08-15 NOTE — Telephone Encounter (Signed)
Patient's daughter advised.  She is asking if the patient should start the trimethoprim rx back after finishing the Augmentin?  Please advise.

## 2021-08-16 DIAGNOSIS — M6281 Muscle weakness (generalized): Secondary | ICD-10-CM | POA: Diagnosis not present

## 2021-08-16 DIAGNOSIS — R2689 Other abnormalities of gait and mobility: Secondary | ICD-10-CM | POA: Diagnosis not present

## 2021-08-16 DIAGNOSIS — J84112 Idiopathic pulmonary fibrosis: Secondary | ICD-10-CM | POA: Diagnosis not present

## 2021-08-16 NOTE — Telephone Encounter (Signed)
FYI patient is in a facility, daughter states they just began Augmentin on 06/08 and per the daughter, Dr. Blanch Media states that he will not continue the patient on Trimethoprim.

## 2021-08-17 DIAGNOSIS — J84112 Idiopathic pulmonary fibrosis: Secondary | ICD-10-CM | POA: Diagnosis not present

## 2021-08-17 DIAGNOSIS — R2689 Other abnormalities of gait and mobility: Secondary | ICD-10-CM | POA: Diagnosis not present

## 2021-08-17 DIAGNOSIS — M6281 Muscle weakness (generalized): Secondary | ICD-10-CM | POA: Diagnosis not present

## 2021-08-19 DIAGNOSIS — J84112 Idiopathic pulmonary fibrosis: Secondary | ICD-10-CM | POA: Diagnosis not present

## 2021-08-19 DIAGNOSIS — R2689 Other abnormalities of gait and mobility: Secondary | ICD-10-CM | POA: Diagnosis not present

## 2021-08-19 DIAGNOSIS — M6281 Muscle weakness (generalized): Secondary | ICD-10-CM | POA: Diagnosis not present

## 2021-08-20 ENCOUNTER — Ambulatory Visit (INDEPENDENT_AMBULATORY_CARE_PROVIDER_SITE_OTHER): Payer: Medicare Other | Admitting: Internal Medicine

## 2021-08-20 ENCOUNTER — Other Ambulatory Visit (HOSPITAL_COMMUNITY)
Admission: RE | Admit: 2021-08-20 | Discharge: 2021-08-20 | Disposition: A | Discharge status: Home / Self Care | Payer: Medicare Other | Source: Ambulatory Visit | Attending: Internal Medicine | Admitting: Internal Medicine

## 2021-08-20 ENCOUNTER — Encounter: Payer: Self-pay | Admitting: Internal Medicine

## 2021-08-20 VITALS — BP 124/82 | HR 70 | Ht 61.0 in | Wt 170.8 lb

## 2021-08-20 DIAGNOSIS — I482 Chronic atrial fibrillation, unspecified: Secondary | ICD-10-CM

## 2021-08-20 DIAGNOSIS — M6281 Muscle weakness (generalized): Secondary | ICD-10-CM | POA: Diagnosis not present

## 2021-08-20 DIAGNOSIS — J84112 Idiopathic pulmonary fibrosis: Secondary | ICD-10-CM | POA: Diagnosis not present

## 2021-08-20 DIAGNOSIS — R2689 Other abnormalities of gait and mobility: Secondary | ICD-10-CM | POA: Diagnosis not present

## 2021-08-20 NOTE — Progress Notes (Signed)
HPI Monique Robles returns today for followup of her atrial fib. She is a pleasant 81 yo woman with uncontrolled atrial fib and an RVR. She has had multiple hospitalizations. She is still undergoing rehab.The patient denies chest pain. When I last saw her we had planned to stop amiodarone on March and start Multaq with plans to transition to dofetilide if her amio level was acceptable. In the interim she notes weakness and sob. She has not had any clear cut atrial fib. When her vitals are checked she appears to be in the 70's. She is in a wheel chair.  Allergies  Allergen Reactions   Coumadin [Warfarin] Hives   Atorvastatin Other (See Comments)    Muscle aches   Ciprofloxacin    Macrobid [Nitrofurantoin]     Cause respiratory flare   Sulfa Antibiotics Itching and Rash     Current Outpatient Medications  Medication Sig Dispense Refill   acetaminophen (TYLENOL) 650 MG CR tablet Take 1 tablet (650 mg total) by mouth every 8 (eight) hours as needed for pain. 12 tablet 0   amoxicillin-clavulanate (AUGMENTIN) 875-125 MG tablet Take 1 tablet by mouth every 12 (twelve) hours. 14 tablet 0   apixaban (ELIQUIS) 5 MG TABS tablet Take 5 mg by mouth 2 (two) times daily.     calcium carbonate (TUMS - DOSED IN MG ELEMENTAL CALCIUM) 500 MG chewable tablet Chew 1 tablet by mouth daily as needed for indigestion or heartburn.     Cholecalciferol (VITAMIN D3) 1.25 MG (50000 UT) CAPS Take 1 capsule by mouth once a week. Monday at lunch     cyanocobalamin (,VITAMIN B-12,) 1000 MCG/ML injection Inject 1 mL (1,000 mcg total) into the muscle every 30 (thirty) days. 12 mL 1   cyclobenzaprine (FLEXERIL) 5 MG tablet Take 5 mg by mouth daily as needed.     diphenhydrAMINE (BENADRYL) 25 MG tablet Take 25 mg by mouth daily as needed for allergies.     dronedarone (MULTAQ) 400 MG tablet Take 400 mg by mouth 2 (two) times daily with a meal.     ferrous sulfate 325 (65 FE) MG tablet 1 twice a day 60 tablet 3   ferrous  sulfate 325 (65 FE) MG tablet Take 1 tablet (325 mg total) by mouth daily with breakfast. 90 tablet 3   furosemide (LASIX) 40 MG tablet Take 40 mg by mouth.     gabapentin (NEURONTIN) 100 MG capsule Take 200 mg by mouth See admin instructions. Take 200 mg in the morning and '200mg'$  at lunch     HYDROcodone-acetaminophen (NORCO/VICODIN) 5-325 MG tablet Take 1 tablet by mouth 2 (two) times daily as needed for moderate pain. 15 tablet 0   Melatonin 10 MG TABS Take 10 mg by mouth at bedtime.     midodrine (PROAMATINE) 5 MG tablet Take 1 tablet (5 mg total) by mouth 2 (two) times daily with a meal. 60 tablet 11   Multiple Vitamin (MULTIVITAMIN) tablet Take 1 tablet by mouth daily.     ondansetron (ZOFRAN) 4 MG tablet Take 4 mg by mouth every 8 (eight) hours as needed.     predniSONE (DELTASONE) 20 MG tablet Take 20 mg by mouth daily with breakfast.     primidone (MYSOLINE) 50 MG tablet Take 50 mg by mouth 2 (two) times daily.     senna-docusate (SENOKOT-S) 8.6-50 MG tablet Take 2 tablets by mouth 2 (two) times daily. 120 tablet 1   sucralfate (CARAFATE) 1 g tablet Take  1 g by mouth at bedtime.     trimethoprim (TRIMPEX) 100 MG tablet Take 100 mg by mouth at bedtime.     Vibegron (GEMTESA) 75 MG TABS Take 1 capsule by mouth daily. (Patient taking differently: Take 1 capsule by mouth at bedtime.) 30 tablet 11   pantoprazole (PROTONIX) 40 MG tablet Take 1 tablet (40 mg total) by mouth daily. (Patient not taking: Reported on 08/20/2021) 30 tablet 1   predniSONE (DELTASONE) 10 MG tablet Take 1 tablet (10 mg total) by mouth See admin instructions. Take 4 Tabs ('40mg'$ ) twice daily for 4 days, then 3 tablets  ('30mg'$ )  twice daily for 4 days, then 2 tablets (20 mg) twice daily for 5 days, then 2 tablets ('20mg'$ ) once daily indefinitely until seen by pulmonology (Patient not taking: Reported on 08/20/2021) 30 tablet 0   No current facility-administered medications for this visit.     Past Medical History:  Diagnosis  Date   (HFpEF) heart failure with preserved ejection fraction ([place])    a. 09/2020 Echo: EF 65-70%, no rwma, GrI DD, nl RV fxn, mildly dil LA. Mild MR. Mild AS; b. 12/2020 Echo: EF 55-60%. Nl RV fxn.   Aortic insufficiency    a. 09/2020 Echo: No significant AI.   Chest pain    a. 10/2020 MV: EF 68%, no ischemia/scar.   Chronic respiratory failure (HCC)    DOE (dyspnea on exertion)    GERD (gastroesophageal reflux disease)    History of pneumonia    History of pulmonary embolism 2001   History of recurrent UTIs    History of stroke 2018   HTN (hypertension)    Hyperlipemia    Hypotension    a. On midodrine.   Mitral valve regurgitation    a. 09/2020 Echo: Mild MR.   Osteoporosis    PAF (paroxysmal atrial fibrillation) ([place])    a. CHA2DS2VASc = 7-->Eliquis; b. 12/2020 Amio started-->DCCV x 1; c. 01/2021 ED eval for AF-->DCCV x 1.   Tremor    Vitamin D deficiency     ROS:   All systems reviewed and negative except as noted in the HPI.   Past Surgical History:  Procedure Laterality Date   BALLOON DILATION N/A 11/28/2020   Procedure: BALLOON DILATION;  Surgeon: Rogene Houston, MD;  [facility];  Service: Endoscopy;  Laterality: N/A;   BLADDER SURGERY     x 3   CARDIOVERSION N/A 01/04/2021   Procedure: CARDIOVERSION;  Surgeon: Arnoldo Lenis, MD;  Location: [facility];  Service: Endoscopy;  Laterality: N/A;   CERVICAL SPINE SURGERY     CHOLECYSTECTOMY     COLONOSCOPY     COLONOSCOPY WITH ESOPHAGOGASTRODUODENOSCOPY (EGD)     ESOPHAGOGASTRODUODENOSCOPY (EGD) WITH PROPOFOL N/A 11/28/2020   Procedure: ESOPHAGOGASTRODUODENOSCOPY (EGD) WITH PROPOFOL;  Surgeon: Rogene Houston, MD;  [facility];  Service: Endoscopy;  Laterality: N/A;   LUMBAR SPINE SURGERY     REPLACEMENT TOTAL KNEE Left    RIGHT LUNG WEDGE REMOVAL     TONSILLECTOMY       Family History  Problem Relation Age of Onset   Alzheimer's disease Mother        died at 13   Cancer Mother     Tremor Mother    Pneumonia Father    Heart disease Father    Diabetes Father    Heart attack Father        died at 28     Social History  Socioeconomic History   Marital status: Widowed    Spouse name: Not on file   Number of children: 3   Years of education: 12   Highest education level: High school graduate  Occupational History   Occupation: Retired  Tobacco Use   Smoking status: Never   Smokeless tobacco: Never  Vaping Use   Vaping Use: Never used  Substance and Sexual Activity   Alcohol use: Not Currently   Drug use: Never   Sexual activity: Not on file  Other Topics Concern   Not on file  Social History Narrative   Lives with her daughter.   Right-handed.   Rare caffeine use.   Social Determinants of Health   Financial Resource Strain: Not on file  Food Insecurity: Not on file  Transportation Needs: Not on file  Physical Activity: Not on file  Stress: Not on file  Social Connections: Not on file  Intimate Partner Violence: Not on file     BP 124/82   Pulse 70   Ht '5\' 1"'$  (1.549 m)   Wt 170 lb 12.8 oz (77.5 kg)   SpO2 98% Comment: 3 LPM  BMI 32.27 kg/m   Physical Exam:  Chronically ill appearing 81 yo woman, NAD HEENT: Unremarkable Neck:  No JVD, no thyromegally Lymphatics:  No adenopathy Back:  No CVA tenderness Lungs:  Clear with no wheezes HEART:  Regular rate rhythm, no murmurs, no rubs, no clicks Abd:  soft, positive bowel sounds, no organomegally, no rebound, no guarding Ext:  2 plus pulses, no edema, no cyanosis, no clubbing Skin:  No rashes no nodules Neuro:  CN II through XII intact, motor grossly intact  EKG - nsr   Assess/Plan:   Atrial fib - she has been maintaining NSR. I will check an amio level and if ok, have her admitted for dofetilide after stopping the multaq 3-4 days. Interstitial lung disease - appears stable at least by exam. She will follow with Dr. Melvyn Novas. Continue oxygen. Hypotension - she is improved Sob -  unclear if this is due to multaq or interstitial lung disease. My plan is to stop multaq prior to starting dofetilide.  Weakness - I would like for her to continue with rehab in hopes of improving her strength.   Royston Sinner Ilir Mahrt,MD

## 2021-08-20 NOTE — Patient Instructions (Signed)
Medication Instructions:  Your physician recommends that you continue on your current medications as directed. Please refer to the Current Medication list given to you today.  Pending lab results will do [facility] admission   *If you need a refill on your cardiac medications before your next appointment, please call your pharmacy*   Lab Work: Your physician recommends that you return for lab work in: Today   If you have labs (blood work) drawn today and your tests are completely normal, you will receive your results only by: [place] (if you have MyChart) OR A paper copy in the mail If you have any lab test that is abnormal or we need to change your treatment, we will call you to review the results.   Testing/Procedures: NONE    Follow-Up: At [facility], you and your health needs are our priority.  As part of our continuing mission to provide you with exceptional heart care, we have created designated Provider Care Teams.  These Care Teams include your primary Cardiologist (physician) and Advanced Practice Providers (APPs -  Physician Assistants and Nurse Practitioners) who all work together to provide you with the care you need, when you need it.  We recommend signing up for the patient portal called "MyChart".  Sign up information is provided on this After Visit Summary.  MyChart is used to connect with patients for Virtual Visits (Telemedicine).  Patients are able to view lab/test results, encounter notes, upcoming appointments, etc.  Non-urgent messages can be sent to your provider as well.   To learn more about what you can do with MyChart, go to [email or web address].    Your next appointment:   4 month(s)  The format for your next appointment:   In Person  Provider:   Cristopher Peru, MD    Other Instructions Thank you for choosing [facility]! \  Important Information About Sugar

## 2021-08-21 DIAGNOSIS — J84112 Idiopathic pulmonary fibrosis: Secondary | ICD-10-CM | POA: Diagnosis not present

## 2021-08-21 DIAGNOSIS — R2689 Other abnormalities of gait and mobility: Secondary | ICD-10-CM | POA: Diagnosis not present

## 2021-08-21 DIAGNOSIS — M6281 Muscle weakness (generalized): Secondary | ICD-10-CM | POA: Diagnosis not present

## 2021-08-22 DIAGNOSIS — J84112 Idiopathic pulmonary fibrosis: Secondary | ICD-10-CM | POA: Diagnosis not present

## 2021-08-22 DIAGNOSIS — R2689 Other abnormalities of gait and mobility: Secondary | ICD-10-CM | POA: Diagnosis not present

## 2021-08-22 DIAGNOSIS — M6281 Muscle weakness (generalized): Secondary | ICD-10-CM | POA: Diagnosis not present

## 2021-08-23 DIAGNOSIS — M6281 Muscle weakness (generalized): Secondary | ICD-10-CM | POA: Diagnosis not present

## 2021-08-23 DIAGNOSIS — R2689 Other abnormalities of gait and mobility: Secondary | ICD-10-CM | POA: Diagnosis not present

## 2021-08-23 DIAGNOSIS — J84112 Idiopathic pulmonary fibrosis: Secondary | ICD-10-CM | POA: Diagnosis not present

## 2021-08-26 DIAGNOSIS — J84112 Idiopathic pulmonary fibrosis: Secondary | ICD-10-CM | POA: Diagnosis not present

## 2021-08-26 DIAGNOSIS — R2689 Other abnormalities of gait and mobility: Secondary | ICD-10-CM | POA: Diagnosis not present

## 2021-08-26 DIAGNOSIS — M6281 Muscle weakness (generalized): Secondary | ICD-10-CM | POA: Diagnosis not present

## 2021-08-27 ENCOUNTER — Encounter: Payer: Self-pay | Admitting: Internal Medicine

## 2021-08-27 DIAGNOSIS — K219 Gastro-esophageal reflux disease without esophagitis: Secondary | ICD-10-CM | POA: Diagnosis not present

## 2021-08-27 DIAGNOSIS — Z8673 Personal history of transient ischemic attack (TIA), and cerebral infarction without residual deficits: Secondary | ICD-10-CM | POA: Diagnosis not present

## 2021-08-27 DIAGNOSIS — R2689 Other abnormalities of gait and mobility: Secondary | ICD-10-CM | POA: Diagnosis not present

## 2021-08-27 DIAGNOSIS — M6281 Muscle weakness (generalized): Secondary | ICD-10-CM | POA: Diagnosis not present

## 2021-08-27 DIAGNOSIS — I5022 Chronic systolic (congestive) heart failure: Secondary | ICD-10-CM | POA: Diagnosis not present

## 2021-08-27 DIAGNOSIS — J84112 Idiopathic pulmonary fibrosis: Secondary | ICD-10-CM | POA: Diagnosis not present

## 2021-08-28 DIAGNOSIS — M6281 Muscle weakness (generalized): Secondary | ICD-10-CM | POA: Diagnosis not present

## 2021-08-28 DIAGNOSIS — R2689 Other abnormalities of gait and mobility: Secondary | ICD-10-CM | POA: Diagnosis not present

## 2021-08-28 DIAGNOSIS — J84112 Idiopathic pulmonary fibrosis: Secondary | ICD-10-CM | POA: Diagnosis not present

## 2021-08-29 ENCOUNTER — Encounter: Payer: Self-pay | Admitting: Internal Medicine

## 2021-08-29 ENCOUNTER — Ambulatory Visit (INDEPENDENT_AMBULATORY_CARE_PROVIDER_SITE_OTHER): Payer: Medicare Other | Admitting: Internal Medicine

## 2021-08-29 DIAGNOSIS — J9611 Chronic respiratory failure with hypoxia: Secondary | ICD-10-CM | POA: Diagnosis not present

## 2021-08-29 DIAGNOSIS — J84112 Idiopathic pulmonary fibrosis: Secondary | ICD-10-CM | POA: Diagnosis not present

## 2021-08-29 DIAGNOSIS — J189 Pneumonia, unspecified organism: Secondary | ICD-10-CM

## 2021-08-29 DIAGNOSIS — T50905A Adverse effect of unspecified drugs, medicaments and biological substances, initial encounter: Secondary | ICD-10-CM | POA: Diagnosis not present

## 2021-08-29 DIAGNOSIS — M6281 Muscle weakness (generalized): Secondary | ICD-10-CM | POA: Diagnosis not present

## 2021-08-29 DIAGNOSIS — R2689 Other abnormalities of gait and mobility: Secondary | ICD-10-CM | POA: Diagnosis not present

## 2021-08-29 LAB — AMIODARONE LEVEL
Amiodarone Lvl: 348 ng/mL — ABNORMAL LOW (ref 1000–2500)
N-Desethyl-Amiodarone: 505 ng/mL

## 2021-08-29 NOTE — Patient Instructions (Addendum)
Decrease prednisone to 10 mg daily x one month then to 5 mg daily therefore   Decrease 02 to 2lpm    Please schedule a follow up visit in 3 months but call sooner if needed

## 2021-08-29 NOTE — Assessment & Plan Note (Signed)
2lpm at rest 4 lpm with activity as of 02/21/2021  - 05/29/21  Could not maintain above 80% on 5lpm pulsed so rec D/E tanks   On 3lpm 24/7 but very sedentary with clear lungs on exam and sats 97% on 3lpm so rec 2 lpm baseline, increase with exertion to keep > 90%           Each maintenance medication was reviewed in detail including emphasizing most importantly the difference between maintenance and prns and under what circumstances the prns are to be triggered using an action plan format where appropriate.  Total time for H and P, chart review, counseling, reviewing 02 device(s) and generating customized AVS unique to this office visit / same day charting =  25 min

## 2021-08-30 ENCOUNTER — Other Ambulatory Visit: Payer: Self-pay

## 2021-08-30 DIAGNOSIS — J84112 Idiopathic pulmonary fibrosis: Secondary | ICD-10-CM | POA: Diagnosis not present

## 2021-08-30 DIAGNOSIS — I482 Chronic atrial fibrillation, unspecified: Secondary | ICD-10-CM

## 2021-08-30 DIAGNOSIS — M6281 Muscle weakness (generalized): Secondary | ICD-10-CM | POA: Diagnosis not present

## 2021-08-30 DIAGNOSIS — R2689 Other abnormalities of gait and mobility: Secondary | ICD-10-CM | POA: Diagnosis not present

## 2021-09-02 DIAGNOSIS — J84112 Idiopathic pulmonary fibrosis: Secondary | ICD-10-CM | POA: Diagnosis not present

## 2021-09-02 DIAGNOSIS — M6281 Muscle weakness (generalized): Secondary | ICD-10-CM | POA: Diagnosis not present

## 2021-09-02 DIAGNOSIS — R2689 Other abnormalities of gait and mobility: Secondary | ICD-10-CM | POA: Diagnosis not present

## 2021-09-03 DIAGNOSIS — R2689 Other abnormalities of gait and mobility: Secondary | ICD-10-CM | POA: Diagnosis not present

## 2021-09-03 DIAGNOSIS — M6281 Muscle weakness (generalized): Secondary | ICD-10-CM | POA: Diagnosis not present

## 2021-09-03 DIAGNOSIS — J84112 Idiopathic pulmonary fibrosis: Secondary | ICD-10-CM | POA: Diagnosis not present

## 2021-09-04 ENCOUNTER — Ambulatory Visit: Payer: Medicare Other | Admitting: Internal Medicine

## 2021-09-04 DIAGNOSIS — M6281 Muscle weakness (generalized): Secondary | ICD-10-CM | POA: Diagnosis not present

## 2021-09-04 DIAGNOSIS — R2689 Other abnormalities of gait and mobility: Secondary | ICD-10-CM | POA: Diagnosis not present

## 2021-09-04 DIAGNOSIS — J84112 Idiopathic pulmonary fibrosis: Secondary | ICD-10-CM | POA: Diagnosis not present

## 2021-09-05 DIAGNOSIS — M6281 Muscle weakness (generalized): Secondary | ICD-10-CM | POA: Diagnosis not present

## 2021-09-05 DIAGNOSIS — J84112 Idiopathic pulmonary fibrosis: Secondary | ICD-10-CM | POA: Diagnosis not present

## 2021-09-05 DIAGNOSIS — R2689 Other abnormalities of gait and mobility: Secondary | ICD-10-CM | POA: Diagnosis not present

## 2021-09-06 DIAGNOSIS — S0990XA Unspecified injury of head, initial encounter: Secondary | ICD-10-CM | POA: Diagnosis not present

## 2021-09-06 DIAGNOSIS — R262 Difficulty in walking, not elsewhere classified: Secondary | ICD-10-CM | POA: Diagnosis not present

## 2021-09-06 DIAGNOSIS — Z888 Allergy status to other drugs, medicaments and biological substances status: Secondary | ICD-10-CM | POA: Diagnosis not present

## 2021-09-06 DIAGNOSIS — G319 Degenerative disease of nervous system, unspecified: Secondary | ICD-10-CM | POA: Diagnosis not present

## 2021-09-06 DIAGNOSIS — Z743 Need for continuous supervision: Secondary | ICD-10-CM | POA: Diagnosis not present

## 2021-09-06 DIAGNOSIS — R6889 Other general symptoms and signs: Secondary | ICD-10-CM | POA: Diagnosis not present

## 2021-09-06 DIAGNOSIS — Z7401 Bed confinement status: Secondary | ICD-10-CM | POA: Diagnosis not present

## 2021-09-06 DIAGNOSIS — M25552 Pain in left hip: Secondary | ICD-10-CM | POA: Diagnosis not present

## 2021-09-06 DIAGNOSIS — R531 Weakness: Secondary | ICD-10-CM | POA: Diagnosis not present

## 2021-09-06 DIAGNOSIS — W19XXXA Unspecified fall, initial encounter: Secondary | ICD-10-CM | POA: Diagnosis not present

## 2021-09-06 DIAGNOSIS — R52 Pain, unspecified: Secondary | ICD-10-CM | POA: Diagnosis not present

## 2021-09-06 DIAGNOSIS — M47812 Spondylosis without myelopathy or radiculopathy, cervical region: Secondary | ICD-10-CM | POA: Diagnosis not present

## 2021-09-06 DIAGNOSIS — G8911 Acute pain due to trauma: Secondary | ICD-10-CM | POA: Diagnosis not present

## 2021-09-06 DIAGNOSIS — Z882 Allergy status to sulfonamides status: Secondary | ICD-10-CM | POA: Diagnosis not present

## 2021-09-06 DIAGNOSIS — R279 Unspecified lack of coordination: Secondary | ICD-10-CM | POA: Diagnosis not present

## 2021-09-06 DIAGNOSIS — Z881 Allergy status to other antibiotic agents status: Secondary | ICD-10-CM | POA: Diagnosis not present

## 2021-09-06 DIAGNOSIS — M549 Dorsalgia, unspecified: Secondary | ICD-10-CM | POA: Diagnosis not present

## 2021-09-06 DIAGNOSIS — S199XXA Unspecified injury of neck, initial encounter: Secondary | ICD-10-CM | POA: Diagnosis not present

## 2021-09-06 DIAGNOSIS — M545 Low back pain, unspecified: Secondary | ICD-10-CM | POA: Diagnosis not present

## 2021-09-06 DIAGNOSIS — W1839XA Other fall on same level, initial encounter: Secondary | ICD-10-CM | POA: Diagnosis not present

## 2021-09-06 DIAGNOSIS — M542 Cervicalgia: Secondary | ICD-10-CM | POA: Diagnosis not present

## 2021-09-06 DIAGNOSIS — Z981 Arthrodesis status: Secondary | ICD-10-CM | POA: Diagnosis not present

## 2021-09-07 DIAGNOSIS — J9611 Chronic respiratory failure with hypoxia: Secondary | ICD-10-CM | POA: Diagnosis not present

## 2021-09-09 DIAGNOSIS — M6281 Muscle weakness (generalized): Secondary | ICD-10-CM | POA: Diagnosis not present

## 2021-09-09 DIAGNOSIS — R2689 Other abnormalities of gait and mobility: Secondary | ICD-10-CM | POA: Diagnosis not present

## 2021-09-09 DIAGNOSIS — J84112 Idiopathic pulmonary fibrosis: Secondary | ICD-10-CM | POA: Diagnosis not present

## 2021-09-10 DIAGNOSIS — J84112 Idiopathic pulmonary fibrosis: Secondary | ICD-10-CM | POA: Diagnosis not present

## 2021-09-10 DIAGNOSIS — R2689 Other abnormalities of gait and mobility: Secondary | ICD-10-CM | POA: Diagnosis not present

## 2021-09-10 DIAGNOSIS — M6281 Muscle weakness (generalized): Secondary | ICD-10-CM | POA: Diagnosis not present

## 2021-09-11 DIAGNOSIS — J84112 Idiopathic pulmonary fibrosis: Secondary | ICD-10-CM | POA: Diagnosis not present

## 2021-09-11 DIAGNOSIS — R2689 Other abnormalities of gait and mobility: Secondary | ICD-10-CM | POA: Diagnosis not present

## 2021-09-11 DIAGNOSIS — M6281 Muscle weakness (generalized): Secondary | ICD-10-CM | POA: Diagnosis not present

## 2021-09-12 ENCOUNTER — Ambulatory Visit: Payer: Medicare Other | Admitting: Physician Assistant

## 2021-09-13 DIAGNOSIS — R2689 Other abnormalities of gait and mobility: Secondary | ICD-10-CM | POA: Diagnosis not present

## 2021-09-13 DIAGNOSIS — M6281 Muscle weakness (generalized): Secondary | ICD-10-CM | POA: Diagnosis not present

## 2021-09-13 DIAGNOSIS — J84112 Idiopathic pulmonary fibrosis: Secondary | ICD-10-CM | POA: Diagnosis not present

## 2021-09-16 DIAGNOSIS — J84112 Idiopathic pulmonary fibrosis: Secondary | ICD-10-CM | POA: Diagnosis not present

## 2021-09-16 DIAGNOSIS — M6281 Muscle weakness (generalized): Secondary | ICD-10-CM | POA: Diagnosis not present

## 2021-09-16 DIAGNOSIS — R2689 Other abnormalities of gait and mobility: Secondary | ICD-10-CM | POA: Diagnosis not present

## 2021-09-17 DIAGNOSIS — M6281 Muscle weakness (generalized): Secondary | ICD-10-CM | POA: Diagnosis not present

## 2021-09-17 DIAGNOSIS — R2689 Other abnormalities of gait and mobility: Secondary | ICD-10-CM | POA: Diagnosis not present

## 2021-09-17 DIAGNOSIS — J84112 Idiopathic pulmonary fibrosis: Secondary | ICD-10-CM | POA: Diagnosis not present

## 2021-09-18 DIAGNOSIS — R2689 Other abnormalities of gait and mobility: Secondary | ICD-10-CM | POA: Diagnosis not present

## 2021-09-18 DIAGNOSIS — J84112 Idiopathic pulmonary fibrosis: Secondary | ICD-10-CM | POA: Diagnosis not present

## 2021-09-18 DIAGNOSIS — M6281 Muscle weakness (generalized): Secondary | ICD-10-CM | POA: Diagnosis not present

## 2021-09-19 DIAGNOSIS — M6281 Muscle weakness (generalized): Secondary | ICD-10-CM | POA: Diagnosis not present

## 2021-09-19 DIAGNOSIS — J84112 Idiopathic pulmonary fibrosis: Secondary | ICD-10-CM | POA: Diagnosis not present

## 2021-09-19 DIAGNOSIS — R2689 Other abnormalities of gait and mobility: Secondary | ICD-10-CM | POA: Diagnosis not present

## 2021-09-20 DIAGNOSIS — M6281 Muscle weakness (generalized): Secondary | ICD-10-CM | POA: Diagnosis not present

## 2021-09-20 DIAGNOSIS — J84112 Idiopathic pulmonary fibrosis: Secondary | ICD-10-CM | POA: Diagnosis not present

## 2021-09-20 DIAGNOSIS — R2689 Other abnormalities of gait and mobility: Secondary | ICD-10-CM | POA: Diagnosis not present

## 2021-09-22 DIAGNOSIS — M6281 Muscle weakness (generalized): Secondary | ICD-10-CM | POA: Diagnosis not present

## 2021-09-22 DIAGNOSIS — R2689 Other abnormalities of gait and mobility: Secondary | ICD-10-CM | POA: Diagnosis not present

## 2021-09-22 DIAGNOSIS — J84112 Idiopathic pulmonary fibrosis: Secondary | ICD-10-CM | POA: Diagnosis not present

## 2021-09-23 DIAGNOSIS — M6281 Muscle weakness (generalized): Secondary | ICD-10-CM | POA: Diagnosis not present

## 2021-09-23 DIAGNOSIS — J84112 Idiopathic pulmonary fibrosis: Secondary | ICD-10-CM | POA: Diagnosis not present

## 2021-09-23 DIAGNOSIS — R2689 Other abnormalities of gait and mobility: Secondary | ICD-10-CM | POA: Diagnosis not present

## 2021-09-24 DIAGNOSIS — J84112 Idiopathic pulmonary fibrosis: Secondary | ICD-10-CM | POA: Diagnosis not present

## 2021-09-24 DIAGNOSIS — R2689 Other abnormalities of gait and mobility: Secondary | ICD-10-CM | POA: Diagnosis not present

## 2021-09-24 DIAGNOSIS — M6281 Muscle weakness (generalized): Secondary | ICD-10-CM | POA: Diagnosis not present

## 2021-09-25 DIAGNOSIS — J84112 Idiopathic pulmonary fibrosis: Secondary | ICD-10-CM | POA: Diagnosis not present

## 2021-09-25 DIAGNOSIS — F5101 Primary insomnia: Secondary | ICD-10-CM | POA: Diagnosis not present

## 2021-09-25 DIAGNOSIS — M6281 Muscle weakness (generalized): Secondary | ICD-10-CM | POA: Diagnosis not present

## 2021-09-25 DIAGNOSIS — R2689 Other abnormalities of gait and mobility: Secondary | ICD-10-CM | POA: Diagnosis not present

## 2021-09-26 DIAGNOSIS — K219 Gastro-esophageal reflux disease without esophagitis: Secondary | ICD-10-CM | POA: Diagnosis not present

## 2021-09-26 DIAGNOSIS — M6281 Muscle weakness (generalized): Secondary | ICD-10-CM | POA: Diagnosis not present

## 2021-09-26 DIAGNOSIS — I5022 Chronic systolic (congestive) heart failure: Secondary | ICD-10-CM | POA: Diagnosis not present

## 2021-09-26 DIAGNOSIS — J84112 Idiopathic pulmonary fibrosis: Secondary | ICD-10-CM | POA: Diagnosis not present

## 2021-09-26 DIAGNOSIS — R2689 Other abnormalities of gait and mobility: Secondary | ICD-10-CM | POA: Diagnosis not present

## 2021-09-26 DIAGNOSIS — Z8673 Personal history of transient ischemic attack (TIA), and cerebral infarction without residual deficits: Secondary | ICD-10-CM | POA: Diagnosis not present

## 2021-10-08 DIAGNOSIS — J9611 Chronic respiratory failure with hypoxia: Secondary | ICD-10-CM | POA: Diagnosis not present

## 2021-10-11 ENCOUNTER — Other Ambulatory Visit: Payer: Self-pay | Admitting: *Deleted

## 2021-10-11 NOTE — Patient Outreach (Signed)
  Care Coordination   10/11/2021 Name: Monique Robles MRN: [id number] DOB: 25-Jul-1940   Care Coordination Outreach Attempts:  An unsuccessful telephone outreach was attempted today to offer the patient information about available care coordination services as a benefit of their health plan.   Follow Up Plan:  Additional outreach attempts will be made to offer the patient care coordination information and services.   Encounter Outcome:  No Answer  Care Coordination Interventions Activated:  No   Care Coordination Interventions:  No, not indicated     Valente David, RN, MSN, [facility] Care Coordinator [phone]

## 2021-10-16 ENCOUNTER — Other Ambulatory Visit: Payer: Self-pay | Admitting: *Deleted

## 2021-10-16 NOTE — Patient Outreach (Signed)
  Care Coordination   10/16/2021 Name: Monique Robles MRN: [id number] DOB: 1940/03/30   Care Coordination Outreach Attempts:  A second unsuccessful outreach was attempted today to offer the patient with information about available care coordination services as a benefit of their health plan.     Follow Up Plan:  Additional outreach attempts will be made to offer the patient care coordination information and services.   Encounter Outcome:  No Answer  Care Coordination Interventions Activated:  No   Care Coordination Interventions:  No, not indicated    Valente David, RN, MSN, [facility] Care Coordinator [phone]

## 2021-10-21 ENCOUNTER — Other Ambulatory Visit: Payer: Self-pay | Admitting: *Deleted

## 2021-10-21 NOTE — Patient Outreach (Signed)
  Care Coordination   10/21/2021 Name: Monique Robles MRN: [id number] DOB: 03/24/1940   Care Coordination Outreach Attempts:  A third unsuccessful outreach was attempted today to offer the patient with information about available care coordination services as a benefit of their health plan.   Follow Up Plan:  No further outreach attempts will be made at this time. We have been unable to contact the patient to offer or enroll patient in care coordination services  Encounter Outcome:  No Answer  Care Coordination Interventions Activated:  No   Care Coordination Interventions:  No, not indicated    Valente David, RN, MSN, [facility] Care Coordinator [phone]

## 2021-10-27 DIAGNOSIS — I5022 Chronic systolic (congestive) heart failure: Secondary | ICD-10-CM | POA: Diagnosis not present

## 2021-10-27 DIAGNOSIS — Z8673 Personal history of transient ischemic attack (TIA), and cerebral infarction without residual deficits: Secondary | ICD-10-CM | POA: Diagnosis not present

## 2021-10-27 DIAGNOSIS — K219 Gastro-esophageal reflux disease without esophagitis: Secondary | ICD-10-CM | POA: Diagnosis not present

## 2021-11-08 DIAGNOSIS — J9611 Chronic respiratory failure with hypoxia: Secondary | ICD-10-CM | POA: Diagnosis not present

## 2021-11-14 DIAGNOSIS — Z23 Encounter for immunization: Secondary | ICD-10-CM | POA: Diagnosis not present

## 2021-11-16 DIAGNOSIS — I10 Essential (primary) hypertension: Secondary | ICD-10-CM | POA: Diagnosis not present

## 2021-11-16 DIAGNOSIS — I4821 Permanent atrial fibrillation: Secondary | ICD-10-CM | POA: Diagnosis not present

## 2021-11-16 DIAGNOSIS — J9611 Chronic respiratory failure with hypoxia: Secondary | ICD-10-CM | POA: Diagnosis not present

## 2021-11-29 DIAGNOSIS — I509 Heart failure, unspecified: Secondary | ICD-10-CM | POA: Diagnosis not present

## 2021-11-29 DIAGNOSIS — B351 Tinea unguium: Secondary | ICD-10-CM | POA: Diagnosis not present

## 2021-11-29 DIAGNOSIS — L603 Nail dystrophy: Secondary | ICD-10-CM | POA: Diagnosis not present

## 2021-11-29 DIAGNOSIS — J9611 Chronic respiratory failure with hypoxia: Secondary | ICD-10-CM | POA: Diagnosis not present

## 2021-11-29 DIAGNOSIS — M2042 Other hammer toe(s) (acquired), left foot: Secondary | ICD-10-CM | POA: Diagnosis not present

## 2021-11-29 DIAGNOSIS — J449 Chronic obstructive pulmonary disease, unspecified: Secondary | ICD-10-CM | POA: Diagnosis not present

## 2021-11-29 DIAGNOSIS — M2041 Other hammer toe(s) (acquired), right foot: Secondary | ICD-10-CM | POA: Diagnosis not present

## 2021-11-29 DIAGNOSIS — I739 Peripheral vascular disease, unspecified: Secondary | ICD-10-CM | POA: Diagnosis not present

## 2021-12-04 ENCOUNTER — Ambulatory Visit: Payer: Medicare Other | Admitting: Internal Medicine

## 2021-12-08 DIAGNOSIS — J9611 Chronic respiratory failure with hypoxia: Secondary | ICD-10-CM | POA: Diagnosis not present

## 2021-12-24 ENCOUNTER — Ambulatory Visit: Payer: Medicare Other | Attending: Internal Medicine | Admitting: Internal Medicine

## 2021-12-24 ENCOUNTER — Encounter: Payer: Self-pay | Admitting: Internal Medicine

## 2021-12-24 VITALS — BP 120/86 | HR 104 | Ht 61.0 in | Wt 174.8 lb

## 2021-12-24 DIAGNOSIS — M6281 Muscle weakness (generalized): Secondary | ICD-10-CM | POA: Diagnosis not present

## 2021-12-24 DIAGNOSIS — I482 Chronic atrial fibrillation, unspecified: Secondary | ICD-10-CM

## 2021-12-24 DIAGNOSIS — R2689 Other abnormalities of gait and mobility: Secondary | ICD-10-CM | POA: Diagnosis not present

## 2021-12-24 NOTE — Addendum Note (Signed)
Addended by: Barbarann Ehlers A on: 12/24/2021 02:02 PM   Modules accepted: Orders

## 2021-12-24 NOTE — Progress Notes (Signed)
HPI Monique Robles returns today for followup of her atrial fib. She is a pleasant 81 yo woman with uncontrolled atrial fib and an RVR. She has had multiple hospitalizations. She is still undergoing rehab.The patient denies chest pain. When I last saw her we had planned to stop amiodarone on March and start Multaq with plans to transition to dofetilide if her amio level was acceptable. It was elevated still, 4 months ago. In the interim she notes weakness. She has not had any clear cut atrial fib. When her vitals are checked she appears to be in the 70's. She is in a wheel chair. It appears that her Multaq was stopped. She is in a SNF. Allergies  Allergen Reactions   Coumadin [Warfarin] Hives   Atorvastatin Other (See Comments)    Muscle aches   Ciprofloxacin    Macrobid [Nitrofurantoin]     Cause respiratory flare   Other Other (See Comments)    Not specified. In chart from nursing facility.    Sulfa Antibiotics Itching and Rash     Current Outpatient Medications  Medication Sig Dispense Refill   acetaminophen (TYLENOL) 650 MG CR tablet Take 1 tablet (650 mg total) by mouth every 8 (eight) hours as needed for pain. 12 tablet 0   apixaban (ELIQUIS) 5 MG TABS tablet Take 5 mg by mouth 2 (two) times daily.     Cholecalciferol (VITAMIN D3) 1.25 MG (50000 UT) CAPS Take 1 capsule by mouth once a week. Monday at lunch     cyanocobalamin (,VITAMIN B-12,) 1000 MCG/ML injection Inject 1 mL (1,000 mcg total) into the muscle every 30 (thirty) days. 12 mL 1   cyclobenzaprine (FLEXERIL) 5 MG tablet Take 5 mg by mouth daily as needed.     diphenhydrAMINE (BENADRYL) 25 MG tablet Take 25 mg by mouth daily as needed for allergies.     docusate sodium (COLACE) 100 MG capsule Take 200 mg by mouth 2 (two) times daily.     ferrous sulfate 325 (65 FE) MG tablet Take 1 tablet (325 mg total) by mouth daily with breakfast. 90 tablet 3   furosemide (LASIX) 40 MG tablet Take 40 mg by mouth.     gabapentin  (NEURONTIN) 100 MG capsule Take 200 mg by mouth See admin instructions. Take 200 mg in the morning and '200mg'$  at lunch     HYDROcodone-acetaminophen (NORCO/VICODIN) 5-325 MG tablet Take 1 tablet by mouth 2 (two) times daily as needed for moderate pain. 15 tablet 0   Melatonin 10 MG TABS Take 10 mg by mouth at bedtime.     midodrine (PROAMATINE) 5 MG tablet Take 1 tablet (5 mg total) by mouth 2 (two) times daily with a meal. 60 tablet 11   omeprazole (PRILOSEC) 40 MG capsule Take 40 mg by mouth daily.     predniSONE (DELTASONE) 5 MG tablet Take 5 mg by mouth daily.     primidone (MYSOLINE) 50 MG tablet Take 50 mg by mouth 2 (two) times daily.     Vibegron (GEMTESA) 75 MG TABS Take 1 capsule by mouth daily. (Patient taking differently: Take 1 capsule by mouth at bedtime.) 30 tablet 11   amoxicillin-clavulanate (AUGMENTIN) 875-125 MG tablet Take 1 tablet by mouth every 12 (twelve) hours. (Patient not taking: Reported on 12/24/2021) 14 tablet 0   calcium carbonate (TUMS - DOSED IN MG ELEMENTAL CALCIUM) 500 MG chewable tablet Chew 1 tablet by mouth daily as needed for indigestion or heartburn. (Patient not taking:  Reported on 12/24/2021)     dronedarone (MULTAQ) 400 MG tablet Take 400 mg by mouth 2 (two) times daily with a meal. (Patient not taking: Reported on 12/24/2021)     ferrous sulfate 325 (65 FE) MG tablet 1 twice a day (Patient not taking: Reported on 12/24/2021) 60 tablet 3   Multiple Vitamin (MULTIVITAMIN) tablet Take 1 tablet by mouth daily. (Patient not taking: Reported on 12/24/2021)     ondansetron (ZOFRAN) 4 MG tablet Take 4 mg by mouth every 8 (eight) hours as needed. (Patient not taking: Reported on 12/24/2021)     pantoprazole (PROTONIX) 40 MG tablet Take 1 tablet (40 mg total) by mouth daily. (Patient not taking: Reported on 12/24/2021) 30 tablet 1   predniSONE (DELTASONE) 10 MG tablet Take 1 tablet (10 mg total) by mouth See admin instructions. Take 4 Tabs ('40mg'$ ) twice daily for 4 days,  then 3 tablets  ('30mg'$ )  twice daily for 4 days, then 2 tablets (20 mg) twice daily for 5 days, then 2 tablets ('20mg'$ ) once daily indefinitely until seen by pulmonology (Patient not taking: Reported on 12/24/2021) 30 tablet 0   predniSONE (DELTASONE) 20 MG tablet Take 20 mg by mouth daily with breakfast. (Patient not taking: Reported on 12/24/2021)     senna-docusate (SENOKOT-S) 8.6-50 MG tablet Take 2 tablets by mouth 2 (two) times daily. (Patient not taking: Reported on 12/24/2021) 120 tablet 1   sucralfate (CARAFATE) 1 g tablet Take 1 g by mouth at bedtime. (Patient not taking: Reported on 12/24/2021)     trimethoprim (TRIMPEX) 100 MG tablet Take 100 mg by mouth at bedtime. (Patient not taking: Reported on 12/24/2021)     No current facility-administered medications for this visit.     Past Medical History:  Diagnosis Date   (HFpEF) heart failure with preserved ejection fraction ([place])    a. 09/2020 Echo: EF 65-70%, no rwma, GrI DD, nl RV fxn, mildly dil LA. Mild MR. Mild AS; b. 12/2020 Echo: EF 55-60%. Nl RV fxn.   Aortic insufficiency    a. 09/2020 Echo: No significant AI.   Chest pain    a. 10/2020 MV: EF 68%, no ischemia/scar.   Chronic respiratory failure (HCC)    DOE (dyspnea on exertion)    GERD (gastroesophageal reflux disease)    History of pneumonia    History of pulmonary embolism 2001   History of recurrent UTIs    History of stroke 2018   HTN (hypertension)    Hyperlipemia    Hypotension    a. On midodrine.   Mitral valve regurgitation    a. 09/2020 Echo: Mild MR.   Osteoporosis    PAF (paroxysmal atrial fibrillation) ([place])    a. CHA2DS2VASc = 7-->Eliquis; b. 12/2020 Amio started-->DCCV x 1; c. 01/2021 ED eval for AF-->DCCV x 1.   Tremor    Vitamin D deficiency     ROS:   All systems reviewed and negative except as noted in the HPI.   Past Surgical History:  Procedure Laterality Date   BALLOON DILATION N/A 11/28/2020   Procedure: BALLOON DILATION;  Surgeon:  Rogene Houston, MD;  [facility];  Service: Endoscopy;  Laterality: N/A;   BLADDER SURGERY     x 3   CARDIOVERSION N/A 01/04/2021   Procedure: CARDIOVERSION;  Surgeon: Arnoldo Lenis, MD;  Location: [facility];  Service: Endoscopy;  Laterality: N/A;   CERVICAL SPINE SURGERY     CHOLECYSTECTOMY     COLONOSCOPY  COLONOSCOPY WITH ESOPHAGOGASTRODUODENOSCOPY (EGD)     ESOPHAGOGASTRODUODENOSCOPY (EGD) WITH PROPOFOL N/A 11/28/2020   Procedure: ESOPHAGOGASTRODUODENOSCOPY (EGD) WITH PROPOFOL;  Surgeon: Rogene Houston, MD;  [facility];  Service: Endoscopy;  Laterality: N/A;   LUMBAR SPINE SURGERY     REPLACEMENT TOTAL KNEE Left    RIGHT LUNG WEDGE REMOVAL     TONSILLECTOMY       Family History  Problem Relation Age of Onset   Alzheimer's disease Mother        died at 42   Cancer Mother    Tremor Mother    Pneumonia Father    Heart disease Father    Diabetes Father    Heart attack Father        died at 7     Social History   Socioeconomic History   Marital status: Widowed    Spouse name: Not on file   Number of children: 3   Years of education: 12   Highest education level: High school graduate  Occupational History   Occupation: Retired  Tobacco Use   Smoking status: Never   Smokeless tobacco: Never  Vaping Use   Vaping Use: Never used  Substance and Sexual Activity   Alcohol use: Not Currently   Drug use: Never   Sexual activity: Not on file  Other Topics Concern   Not on file  Social History Narrative   Lives with her daughter.   Right-handed.   Rare caffeine use.   Social Determinants of Health   Financial Resource Strain: Not on file  Food Insecurity: Not on file  Transportation Needs: Not on file  Physical Activity: Not on file  Stress: Not on file  Social Connections: Not on file  Intimate Partner Violence: Not on file     BP 120/86   Pulse (!) 104   Ht '5\' 1"'$  (1.549 m)   Wt 174 lb 12.8 oz (79.3 kg)   SpO2 99%   BMI  33.03 kg/m   Physical Exam:  Chronically ill appearing NAD HEENT: Unremarkable Neck:  No JVD, no thyromegally Lymphatics:  No adenopathy Back:  No CVA tenderness Lungs:  Clear with reduced breath sounds. HEART:  Regular rate rhythm, no murmurs, no rubs, no clicks Abd:  soft, positive bowel sounds, no organomegally, no rebound, no guarding Ext:  2 plus pulses, no edema, no cyanosis, no clubbing Skin:  No rashes no nodules Neuro:  CN II through XII intact, motor grossly intact  EKG - sinus tachy at 100   Assess/Plan:   Atrial fib - she has been maintaining NSR. I will have her admitted for initiation of dofetilide. Interstitial lung disease - appears stable at least by exam. She will follow with Dr. Melvyn Novas. Continue oxygen. Hypotension - she is improved Sob - may be better. She is off of the multaq. Weakness - I would like for her to continue with rehab in hopes of improving her strength.    Monique Overlie Nery Frappier,MD

## 2021-12-24 NOTE — Patient Instructions (Signed)
Medication Instructions:  Your physician recommends that you continue on your current medications as directed. Please refer to the Current Medication list given to you today.  *If you need a refill on your cardiac medications before your next appointment, please call your pharmacy*   Lab Work: NONE   If you have labs (blood work) drawn today and your tests are completely normal, you will receive your results only by: [place] (if you have MyChart) OR A paper copy in the mail If you have any lab test that is abnormal or we need to change your treatment, we will call you to review the results.   Testing/Procedures: Admit for Dofetilide      Follow-Up: At [facility], you and your health needs are our priority.  As part of our continuing mission to provide you with exceptional heart care, we have created designated Provider Care Teams.  These Care Teams include your primary Cardiologist (physician) and Advanced Practice Providers (APPs -  Physician Assistants and Nurse Practitioners) who all work together to provide you with the care you need, when you need it.  We recommend signing up for the patient portal called "MyChart".  Sign up information is provided on this After Visit Summary.  MyChart is used to connect with patients for Virtual Visits (Telemedicine).  Patients are able to view lab/test results, encounter notes, upcoming appointments, etc.  Non-urgent messages can be sent to your provider as well.   To learn more about what you can do with MyChart, go to [email or web address].    Your next appointment:   2 week(s)  The format for your next appointment:   In Person  Provider:   You will follow up in the [facility] located at [facility]. Your provider will be: Roderic Palau, NP    Other Instructions Thank you for choosing [facility]!    Important Information About Sugar

## 2021-12-25 ENCOUNTER — Telehealth: Payer: Self-pay | Admitting: Pharmacist

## 2021-12-25 DIAGNOSIS — M6281 Muscle weakness (generalized): Secondary | ICD-10-CM | POA: Diagnosis not present

## 2021-12-25 DIAGNOSIS — R2689 Other abnormalities of gait and mobility: Secondary | ICD-10-CM | POA: Diagnosis not present

## 2021-12-25 DIAGNOSIS — N3021 Other chronic cystitis with hematuria: Secondary | ICD-10-CM

## 2021-12-25 NOTE — Telephone Encounter (Signed)
Called patient and LVM to call back - to get the updated current medications list so I can assess durg-drug interactions accurately

## 2021-12-27 ENCOUNTER — Encounter (HOSPITAL_COMMUNITY): Payer: Self-pay

## 2021-12-27 ENCOUNTER — Telehealth: Payer: Self-pay | Admitting: Pharmacist

## 2021-12-27 DIAGNOSIS — R2689 Other abnormalities of gait and mobility: Secondary | ICD-10-CM | POA: Diagnosis not present

## 2021-12-27 DIAGNOSIS — M6281 Muscle weakness (generalized): Secondary | ICD-10-CM | POA: Diagnosis not present

## 2021-12-27 NOTE — Telephone Encounter (Signed)
Dr. Lovena Le recommends primidone be stopped prior to admission. Left message with daughter to discuss.

## 2021-12-27 NOTE — Telephone Encounter (Signed)
Medication list reviewed in anticipation of upcoming Tikosyn initiation. Patient is taking 3 contraindicated or QTc prolonging medications.   Concurrent use of CYCLOBENZAPRINE and Tikosyn may result in an increased risk of QT-interval prolongation. Patient should hold cyclobenzaprine.  Concurrent use of ONDANSETRON and Tikosyn may result in an increased risk of cardiotoxicity (QT prolongation, torsades de pointes, cardiac arrest).  Patient should hold ondansetron  Concurrent use of Primidone/Phenobarbital and CYP3A4 SUBSTRATES WHICH ARE ALSO QT PROLONGING AGENTS may result in an increased risk of QT interval prolongation, reduced CYP3A substrate exposure and reduced efficacy of CYP3A substrate.  Recommend contacting prescriber or neurology to discuss epilepsy management while patient admitted.   Patient is anticoagulated on Eliquis on the appropriate dose. Please ensure that patient has not missed any anticoagulation doses in the 3 weeks prior to Tikosyn initiation.   Patient will need to be counseled to avoid use of Benadryl while on Tikosyn and in the 2-3 days prior to Tikosyn initiation.

## 2021-12-27 NOTE — Telephone Encounter (Signed)
Spoke with patient's daughter and updated medication list

## 2021-12-28 DIAGNOSIS — M6281 Muscle weakness (generalized): Secondary | ICD-10-CM | POA: Diagnosis not present

## 2021-12-28 DIAGNOSIS — R2689 Other abnormalities of gait and mobility: Secondary | ICD-10-CM | POA: Diagnosis not present

## 2021-12-30 DIAGNOSIS — R2689 Other abnormalities of gait and mobility: Secondary | ICD-10-CM | POA: Diagnosis not present

## 2021-12-30 DIAGNOSIS — M6281 Muscle weakness (generalized): Secondary | ICD-10-CM | POA: Diagnosis not present

## 2021-12-31 DIAGNOSIS — M6281 Muscle weakness (generalized): Secondary | ICD-10-CM | POA: Diagnosis not present

## 2021-12-31 DIAGNOSIS — R2689 Other abnormalities of gait and mobility: Secondary | ICD-10-CM | POA: Diagnosis not present

## 2022-01-01 DIAGNOSIS — M6281 Muscle weakness (generalized): Secondary | ICD-10-CM | POA: Diagnosis not present

## 2022-01-01 DIAGNOSIS — R2689 Other abnormalities of gait and mobility: Secondary | ICD-10-CM | POA: Diagnosis not present

## 2022-01-02 DIAGNOSIS — R2689 Other abnormalities of gait and mobility: Secondary | ICD-10-CM | POA: Diagnosis not present

## 2022-01-02 DIAGNOSIS — M6281 Muscle weakness (generalized): Secondary | ICD-10-CM | POA: Diagnosis not present

## 2022-01-05 DIAGNOSIS — J9611 Chronic respiratory failure with hypoxia: Secondary | ICD-10-CM | POA: Diagnosis not present

## 2022-01-05 DIAGNOSIS — I509 Heart failure, unspecified: Secondary | ICD-10-CM | POA: Diagnosis not present

## 2022-01-05 DIAGNOSIS — J449 Chronic obstructive pulmonary disease, unspecified: Secondary | ICD-10-CM | POA: Diagnosis not present

## 2022-01-05 DIAGNOSIS — M6281 Muscle weakness (generalized): Secondary | ICD-10-CM | POA: Diagnosis not present

## 2022-01-05 DIAGNOSIS — R2689 Other abnormalities of gait and mobility: Secondary | ICD-10-CM | POA: Diagnosis not present

## 2022-01-08 DIAGNOSIS — R531 Weakness: Secondary | ICD-10-CM | POA: Diagnosis not present

## 2022-01-08 DIAGNOSIS — J9611 Chronic respiratory failure with hypoxia: Secondary | ICD-10-CM | POA: Diagnosis not present

## 2022-01-08 DIAGNOSIS — J84112 Idiopathic pulmonary fibrosis: Secondary | ICD-10-CM | POA: Diagnosis not present

## 2022-01-08 DIAGNOSIS — M6281 Muscle weakness (generalized): Secondary | ICD-10-CM | POA: Diagnosis not present

## 2022-01-08 DIAGNOSIS — I480 Paroxysmal atrial fibrillation: Secondary | ICD-10-CM | POA: Diagnosis not present

## 2022-01-08 DIAGNOSIS — R2689 Other abnormalities of gait and mobility: Secondary | ICD-10-CM | POA: Diagnosis not present

## 2022-01-08 NOTE — Telephone Encounter (Signed)
Per Daughter - the following medications were stopped on 10/23. CYCLOBENZAPRINE (Flexeril) ONDANSETRON (zofran) Primidone Benadryl Pt tolerating off primidone and they do not feel replacement is needed at this time.

## 2022-01-09 DIAGNOSIS — R2689 Other abnormalities of gait and mobility: Secondary | ICD-10-CM | POA: Diagnosis not present

## 2022-01-09 DIAGNOSIS — J84112 Idiopathic pulmonary fibrosis: Secondary | ICD-10-CM | POA: Diagnosis not present

## 2022-01-09 DIAGNOSIS — R531 Weakness: Secondary | ICD-10-CM | POA: Diagnosis not present

## 2022-01-09 DIAGNOSIS — I480 Paroxysmal atrial fibrillation: Secondary | ICD-10-CM | POA: Diagnosis not present

## 2022-01-09 DIAGNOSIS — M6281 Muscle weakness (generalized): Secondary | ICD-10-CM | POA: Diagnosis not present

## 2022-01-10 DIAGNOSIS — I480 Paroxysmal atrial fibrillation: Secondary | ICD-10-CM | POA: Diagnosis not present

## 2022-01-10 DIAGNOSIS — R531 Weakness: Secondary | ICD-10-CM | POA: Diagnosis not present

## 2022-01-10 DIAGNOSIS — R2689 Other abnormalities of gait and mobility: Secondary | ICD-10-CM | POA: Diagnosis not present

## 2022-01-10 DIAGNOSIS — M6281 Muscle weakness (generalized): Secondary | ICD-10-CM | POA: Diagnosis not present

## 2022-01-10 DIAGNOSIS — J84112 Idiopathic pulmonary fibrosis: Secondary | ICD-10-CM | POA: Diagnosis not present

## 2022-01-11 DIAGNOSIS — M6281 Muscle weakness (generalized): Secondary | ICD-10-CM | POA: Diagnosis not present

## 2022-01-11 DIAGNOSIS — I480 Paroxysmal atrial fibrillation: Secondary | ICD-10-CM | POA: Diagnosis not present

## 2022-01-11 DIAGNOSIS — J84112 Idiopathic pulmonary fibrosis: Secondary | ICD-10-CM | POA: Diagnosis not present

## 2022-01-11 DIAGNOSIS — R531 Weakness: Secondary | ICD-10-CM | POA: Diagnosis not present

## 2022-01-11 DIAGNOSIS — R2689 Other abnormalities of gait and mobility: Secondary | ICD-10-CM | POA: Diagnosis not present

## 2022-01-13 ENCOUNTER — Ambulatory Visit (HOSPITAL_COMMUNITY): Payer: Medicare Other | Admitting: Physician Assistant

## 2022-01-13 DIAGNOSIS — R2689 Other abnormalities of gait and mobility: Secondary | ICD-10-CM | POA: Diagnosis not present

## 2022-01-13 DIAGNOSIS — I480 Paroxysmal atrial fibrillation: Secondary | ICD-10-CM | POA: Diagnosis not present

## 2022-01-13 DIAGNOSIS — M6281 Muscle weakness (generalized): Secondary | ICD-10-CM | POA: Diagnosis not present

## 2022-01-13 DIAGNOSIS — J84112 Idiopathic pulmonary fibrosis: Secondary | ICD-10-CM | POA: Diagnosis not present

## 2022-01-13 DIAGNOSIS — R531 Weakness: Secondary | ICD-10-CM | POA: Diagnosis not present

## 2022-01-14 DIAGNOSIS — I480 Paroxysmal atrial fibrillation: Secondary | ICD-10-CM | POA: Diagnosis not present

## 2022-01-14 DIAGNOSIS — J84112 Idiopathic pulmonary fibrosis: Secondary | ICD-10-CM | POA: Diagnosis not present

## 2022-01-14 DIAGNOSIS — M6281 Muscle weakness (generalized): Secondary | ICD-10-CM | POA: Diagnosis not present

## 2022-01-14 DIAGNOSIS — R531 Weakness: Secondary | ICD-10-CM | POA: Diagnosis not present

## 2022-01-14 DIAGNOSIS — R2689 Other abnormalities of gait and mobility: Secondary | ICD-10-CM | POA: Diagnosis not present

## 2022-01-15 DIAGNOSIS — R2689 Other abnormalities of gait and mobility: Secondary | ICD-10-CM | POA: Diagnosis not present

## 2022-01-15 DIAGNOSIS — J84112 Idiopathic pulmonary fibrosis: Secondary | ICD-10-CM | POA: Diagnosis not present

## 2022-01-15 DIAGNOSIS — I480 Paroxysmal atrial fibrillation: Secondary | ICD-10-CM | POA: Diagnosis not present

## 2022-01-15 DIAGNOSIS — M6281 Muscle weakness (generalized): Secondary | ICD-10-CM | POA: Diagnosis not present

## 2022-01-15 DIAGNOSIS — R531 Weakness: Secondary | ICD-10-CM | POA: Diagnosis not present

## 2022-01-16 DIAGNOSIS — R2689 Other abnormalities of gait and mobility: Secondary | ICD-10-CM | POA: Diagnosis not present

## 2022-01-16 DIAGNOSIS — J84112 Idiopathic pulmonary fibrosis: Secondary | ICD-10-CM | POA: Diagnosis not present

## 2022-01-16 DIAGNOSIS — I480 Paroxysmal atrial fibrillation: Secondary | ICD-10-CM | POA: Diagnosis not present

## 2022-01-16 DIAGNOSIS — R531 Weakness: Secondary | ICD-10-CM | POA: Diagnosis not present

## 2022-01-16 DIAGNOSIS — M6281 Muscle weakness (generalized): Secondary | ICD-10-CM | POA: Diagnosis not present

## 2022-01-17 DIAGNOSIS — M6281 Muscle weakness (generalized): Secondary | ICD-10-CM | POA: Diagnosis not present

## 2022-01-17 DIAGNOSIS — I480 Paroxysmal atrial fibrillation: Secondary | ICD-10-CM | POA: Diagnosis not present

## 2022-01-17 DIAGNOSIS — R2689 Other abnormalities of gait and mobility: Secondary | ICD-10-CM | POA: Diagnosis not present

## 2022-01-17 DIAGNOSIS — R531 Weakness: Secondary | ICD-10-CM | POA: Diagnosis not present

## 2022-01-17 DIAGNOSIS — J84112 Idiopathic pulmonary fibrosis: Secondary | ICD-10-CM | POA: Diagnosis not present

## 2022-01-18 DIAGNOSIS — R2689 Other abnormalities of gait and mobility: Secondary | ICD-10-CM | POA: Diagnosis not present

## 2022-01-18 DIAGNOSIS — I480 Paroxysmal atrial fibrillation: Secondary | ICD-10-CM | POA: Diagnosis not present

## 2022-01-18 DIAGNOSIS — J84112 Idiopathic pulmonary fibrosis: Secondary | ICD-10-CM | POA: Diagnosis not present

## 2022-01-18 DIAGNOSIS — M6281 Muscle weakness (generalized): Secondary | ICD-10-CM | POA: Diagnosis not present

## 2022-01-18 DIAGNOSIS — R531 Weakness: Secondary | ICD-10-CM | POA: Diagnosis not present

## 2022-01-19 DIAGNOSIS — I4821 Permanent atrial fibrillation: Secondary | ICD-10-CM | POA: Diagnosis not present

## 2022-01-19 DIAGNOSIS — I10 Essential (primary) hypertension: Secondary | ICD-10-CM | POA: Diagnosis not present

## 2022-01-19 DIAGNOSIS — G8194 Hemiplegia, unspecified affecting left nondominant side: Secondary | ICD-10-CM | POA: Diagnosis not present

## 2022-01-21 ENCOUNTER — Inpatient Hospital Stay (HOSPITAL_COMMUNITY)
Admission: RE | Admit: 2022-01-21 | Discharge: 2022-01-24 | DRG: 309 | Disposition: A | Discharge status: Skilled Nursing Facility | Payer: Medicare Other | Source: Ambulatory Visit | Attending: Internal Medicine | Admitting: Internal Medicine

## 2022-01-21 ENCOUNTER — Ambulatory Visit (HOSPITAL_COMMUNITY)
Admission: RE | Admit: 2022-01-21 | Discharge: 2022-01-21 | Disposition: A | Discharge status: Home / Self Care | Payer: Medicare Other | Source: Ambulatory Visit | Attending: Physician Assistant | Admitting: Physician Assistant

## 2022-01-21 ENCOUNTER — Other Ambulatory Visit (HOSPITAL_COMMUNITY): Payer: Self-pay

## 2022-01-21 VITALS — BP 116/82 | HR 91 | Wt 175.6 lb

## 2022-01-21 DIAGNOSIS — Z96652 Presence of left artificial knee joint: Secondary | ICD-10-CM | POA: Diagnosis not present

## 2022-01-21 DIAGNOSIS — I959 Hypotension, unspecified: Secondary | ICD-10-CM | POA: Diagnosis present

## 2022-01-21 DIAGNOSIS — K219 Gastro-esophageal reflux disease without esophagitis: Secondary | ICD-10-CM | POA: Diagnosis present

## 2022-01-21 DIAGNOSIS — Z882 Allergy status to sulfonamides status: Secondary | ICD-10-CM | POA: Diagnosis not present

## 2022-01-21 DIAGNOSIS — Z881 Allergy status to other antibiotic agents status: Secondary | ICD-10-CM | POA: Diagnosis not present

## 2022-01-21 DIAGNOSIS — Z8673 Personal history of transient ischemic attack (TIA), and cerebral infarction without residual deficits: Secondary | ICD-10-CM

## 2022-01-21 DIAGNOSIS — Z888 Allergy status to other drugs, medicaments and biological substances status: Secondary | ICD-10-CM

## 2022-01-21 DIAGNOSIS — Z9981 Dependence on supplemental oxygen: Secondary | ICD-10-CM

## 2022-01-21 DIAGNOSIS — Z8249 Family history of ischemic heart disease and other diseases of the circulatory system: Secondary | ICD-10-CM

## 2022-01-21 DIAGNOSIS — M810 Age-related osteoporosis without current pathological fracture: Secondary | ICD-10-CM | POA: Diagnosis not present

## 2022-01-21 DIAGNOSIS — I080 Rheumatic disorders of both mitral and aortic valves: Secondary | ICD-10-CM | POA: Diagnosis present

## 2022-01-21 DIAGNOSIS — J849 Interstitial pulmonary disease, unspecified: Secondary | ICD-10-CM | POA: Diagnosis present

## 2022-01-21 DIAGNOSIS — Z9049 Acquired absence of other specified parts of digestive tract: Secondary | ICD-10-CM

## 2022-01-21 DIAGNOSIS — E559 Vitamin D deficiency, unspecified: Secondary | ICD-10-CM | POA: Diagnosis not present

## 2022-01-21 DIAGNOSIS — Z7901 Long term (current) use of anticoagulants: Secondary | ICD-10-CM

## 2022-01-21 DIAGNOSIS — J9610 Chronic respiratory failure, unspecified whether with hypoxia or hypercapnia: Secondary | ICD-10-CM | POA: Diagnosis not present

## 2022-01-21 DIAGNOSIS — I110 Hypertensive heart disease with heart failure: Secondary | ICD-10-CM | POA: Diagnosis present

## 2022-01-21 DIAGNOSIS — E785 Hyperlipidemia, unspecified: Secondary | ICD-10-CM | POA: Diagnosis present

## 2022-01-21 DIAGNOSIS — D6869 Other thrombophilia: Secondary | ICD-10-CM | POA: Diagnosis present

## 2022-01-21 DIAGNOSIS — I480 Paroxysmal atrial fibrillation: Secondary | ICD-10-CM | POA: Insufficient documentation

## 2022-01-21 DIAGNOSIS — Z6833 Body mass index (BMI) 33.0-33.9, adult: Secondary | ICD-10-CM

## 2022-01-21 DIAGNOSIS — Z79899 Other long term (current) drug therapy: Secondary | ICD-10-CM

## 2022-01-21 DIAGNOSIS — Z87440 Personal history of urinary (tract) infections: Secondary | ICD-10-CM | POA: Diagnosis not present

## 2022-01-21 DIAGNOSIS — E669 Obesity, unspecified: Secondary | ICD-10-CM | POA: Diagnosis present

## 2022-01-21 DIAGNOSIS — I5032 Chronic diastolic (congestive) heart failure: Secondary | ICD-10-CM | POA: Diagnosis present

## 2022-01-21 DIAGNOSIS — Z86711 Personal history of pulmonary embolism: Secondary | ICD-10-CM

## 2022-01-21 LAB — BASIC METABOLIC PANEL
Anion gap: 13 (ref 5–15)
BUN: 16 mg/dL (ref 8–23)
CO2: 28 mmol/L (ref 22–32)
Calcium: 9.8 mg/dL (ref 8.9–10.3)
Chloride: 102 mmol/L (ref 98–111)
Creatinine, Ser: 0.81 mg/dL (ref 0.44–1.00)
GFR, Estimated: >60 mL/min (ref >60)
Glucose, Bld: 110 mg/dL — ABNORMAL HIGH (ref 70–99)
Potassium: 4.2 mmol/L (ref 3.5–5.1)
Sodium: 143 mmol/L (ref 135–145)

## 2022-01-21 LAB — MAGNESIUM: Magnesium: 2.3 mg/dL (ref 1.7–2.4)

## 2022-01-21 MED ORDER — DOFETILIDE 500 MCG PO CAPS
500.0000 ug | ORAL_CAPSULE | Freq: Two times a day (BID) | ORAL | Status: DC
  Administered 2022-01-21 – 2022-01-22 (×2): 500 ug via ORAL
  Filled 2022-01-21 (×2): qty 1

## 2022-01-21 MED ORDER — FUROSEMIDE 40 MG PO TABS
40.0000 mg | ORAL_TABLET | Freq: Every day | ORAL | Status: DC
  Administered 2022-01-22: 40 mg via ORAL
  Filled 2022-01-21: qty 1

## 2022-01-21 MED ORDER — GABAPENTIN 100 MG PO CAPS
200.0000 mg | ORAL_CAPSULE | ORAL | Status: DC
  Administered 2022-01-21 – 2022-01-24 (×7): 200 mg via ORAL
  Filled 2022-01-21 (×7): qty 2

## 2022-01-21 MED ORDER — PREDNISONE 5 MG PO TABS
5.0000 mg | ORAL_TABLET | Freq: Every day | ORAL | Status: DC
  Administered 2022-01-22 – 2022-01-24 (×3): 5 mg via ORAL
  Filled 2022-01-21 (×3): qty 1

## 2022-01-21 MED ORDER — HYDROCODONE-ACETAMINOPHEN 5-325 MG PO TABS
1.0000 | ORAL_TABLET | Freq: Two times a day (BID) | ORAL | Status: DC | PRN
  Administered 2022-01-21 – 2022-01-24 (×5): 1 via ORAL
  Filled 2022-01-21 (×5): qty 1

## 2022-01-21 MED ORDER — GABAPENTIN 100 MG PO CAPS: 200.0000 mg | ORAL_CAPSULE | ORAL | Status: DC

## 2022-01-21 MED ORDER — ACETAMINOPHEN 325 MG PO TABS
650.0000 mg | ORAL_TABLET | Freq: Three times a day (TID) | ORAL | Status: DC | PRN
  Administered 2022-01-22 – 2022-01-23 (×2): 650 mg via ORAL
  Filled 2022-01-21 (×2): qty 2

## 2022-01-21 MED ORDER — MIDODRINE HCL 5 MG PO TABS
5.0000 mg | ORAL_TABLET | Freq: Two times a day (BID) | ORAL | Status: DC
  Administered 2022-01-21 – 2022-01-24 (×6): 5 mg via ORAL
  Filled 2022-01-21 (×6): qty 1

## 2022-01-21 MED ORDER — PANTOPRAZOLE SODIUM 40 MG PO TBEC
80.0000 mg | DELAYED_RELEASE_TABLET | Freq: Every day | ORAL | Status: DC
  Administered 2022-01-22 – 2022-01-24 (×3): 80 mg via ORAL
  Filled 2022-01-21 (×3): qty 2

## 2022-01-21 MED ORDER — SODIUM CHLORIDE 0.9% FLUSH: 3.0000 mL | INTRAVENOUS | Status: DC | PRN

## 2022-01-21 MED ORDER — DOCUSATE SODIUM 100 MG PO CAPS
200.0000 mg | ORAL_CAPSULE | Freq: Two times a day (BID) | ORAL | Status: DC
  Administered 2022-01-21 – 2022-01-24 (×6): 200 mg via ORAL
  Filled 2022-01-21 (×6): qty 2

## 2022-01-21 MED ORDER — SODIUM CHLORIDE 0.9 % IV SOLN: 250.0000 mL | INTRAVENOUS | Status: DC | PRN

## 2022-01-21 MED ORDER — ORAL CARE MOUTH RINSE: 15.0000 mL | OROMUCOSAL | Status: DC | PRN

## 2022-01-21 MED ORDER — FERROUS SULFATE 325 (65 FE) MG PO TABS
325.0000 mg | ORAL_TABLET | Freq: Every day | ORAL | Status: DC
  Administered 2022-01-22 – 2022-01-24 (×3): 325 mg via ORAL
  Filled 2022-01-21 (×3): qty 1

## 2022-01-21 MED ORDER — APIXABAN 5 MG PO TABS
5.0000 mg | ORAL_TABLET | Freq: Two times a day (BID) | ORAL | Status: DC
  Administered 2022-01-21 – 2022-01-24 (×6): 5 mg via ORAL
  Filled 2022-01-21 (×6): qty 1

## 2022-01-21 MED ORDER — MIRABEGRON ER 25 MG PO TB24
25.0000 mg | ORAL_TABLET | Freq: Every day | ORAL | Status: DC
  Administered 2022-01-22 – 2022-01-24 (×3): 25 mg via ORAL
  Filled 2022-01-21 (×3): qty 1

## 2022-01-21 MED ORDER — SODIUM CHLORIDE 0.9% FLUSH
3.0000 mL | Freq: Two times a day (BID) | INTRAVENOUS | Status: DC
  Administered 2022-01-21 – 2022-01-23 (×6): 3 mL via INTRAVENOUS

## 2022-01-21 MED ORDER — MELATONIN 5 MG PO TABS
10.0000 mg | ORAL_TABLET | Freq: Every day | ORAL | Status: DC
  Administered 2022-01-21 – 2022-01-23 (×3): 10 mg via ORAL
  Filled 2022-01-21 (×3): qty 2

## 2022-01-21 NOTE — Plan of Care (Signed)

## 2022-01-21 NOTE — Progress Notes (Signed)
Pharmacy: Dofetilide (Tikosyn) - Initial Consult Assessment and Electrolyte Replacement  Pharmacy consulted to assist in monitoring and replacing electrolytes in this 81 y.o. female admitted on 01/21/2022 undergoing dofetilide initiation. First dofetilide dose: 01/21/22  Assessment:  Patient Exclusion Criteria: If any screening criteria checked as "Yes", then  patient  should NOT receive dofetilide until criteria item is corrected.  If "Yes" please indicate correction plan.  YES  NO Patient  Exclusion Criteria Correction Plan   '[]'$   '[x]'$   Baseline QTc interval is greater than or equal to 440 msec. IF above YES box checked dofetilide contraindicated unless patient has ICD; then may proceed if QTc 500-550 msec or with known ventricular conduction abnormalities may proceed with QTc 550-600 msec. QTc = 423    '[]'$   '[x]'$   Patient is known or suspected to have a digoxin level greater than 2 ng/ml: No results found for: "DIGOXIN"     '[]'$   '[x]'$   Creatinine clearance less than 20 ml/min (calculated using Cockcroft-Gault, actual body weight and serum creatinine): Estimated Creatinine Clearance: 53.9 mL/min (by C-G formula based on SCr of 0.81 mg/dL).     '[]'$   '[x]'$  Patient has received drugs known to prolong the QT intervals within the last 48 hours (phenothiazines, tricyclics or tetracyclic antidepressants, erythromycin, H-1 antihistamines, cisapride, fluoroquinolones, azithromycin, ondansetron).   Updated information on QT prolonging agents is available to be searched on the following database:QT prolonging agents  Ondansetron, cyclobenzaprine, benadryl, and primidone discontinued   '[]'$   '[x]'$   Patient received a dose of hydrochlorothiazide (Oretic) alone or in any combination including triamterene (Dyazide, Maxzide) in the last 48 hours.    '[]'$   '[x]'$  Patient received a medication known to increase dofetilide plasma concentrations prior to initial dofetilide dose:  Trimethoprim (Primsol, Proloprim)  in the last 36 hours Verapamil (Calan, Verelan) in the last 36 hours or a sustained release dose in the last 72 hours Megestrol (Megace) in the last 5 days  Cimetidine (Tagamet) in the last 6 hours Ketoconazole (Nizoral) in the last 24 hours Itraconazole (Sporanox) in the last 48 hours  Prochlorperazine (Compazine) in the last 36 hours     '[]'$   '[x]'$   Patient is known to have a history of torsades de pointes; congenital or acquired long QT syndromes.    '[]'$   '[x]'$   Patient has received a Class 1 antiarrhythmic with less than 2 half-lives since last dose. (Disopyramide, Quinidine, Procainamide, Lidocaine, Mexiletine, Flecainide, Propafenone) Dronedarone stopped previously   '[]'$   '[x]'$   Patient has received amiodarone therapy in the past 3 months or amiodarone level is greater than 0.3 ng/ml. Amiodarone previously discontinued   Labs:    Component Value Date/Time   K 4.2 01/21/2022 1155   MG 2.3 01/21/2022 1155     Plan: Select One Calculated CrCl  Dose q12h  '[x]'$  > 60 ml/min 500 mcg  '[]'$  40-60 ml/min 250 mcg  '[]'$  20-40 ml/min 125 mcg   '[x]'$   Physician selected initial dose within range recommended for patients level of renal function - will monitor for response.  '[]'$   Physician selected initial dose outside of range recommended for patients level of renal function - will discuss if the dose should be altered at this time.   Patient has been appropriately anticoagulated with apixaban '5mg'$  BID.  Potassium: K >/= 4: Appropriate to initiate Tikosyn, no replacement needed    Magnesium: Mg >2: Appropriate to initiate Tikosyn, no replacement needed     Thank you for allowing pharmacy to participate in this  patient's care   Arrie Senate, PharmD, BCPS, [facility] Clinical Pharmacist [phone] Please check AMION for all [place] numbers 01/21/2022

## 2022-01-21 NOTE — H&P (Addendum)
Primary Care Physician: Celene Squibb, MD Primary Cardiologist: Dr Harrington Challenger Primary Electrophysiologist: Dr Lovena Le Referring Physician: Dr Aleatha Borer Monique Robles is a 81 y.o. female with a history of aortic insufficiency, CVA, HFpEF, hypotension, HLD, MR, ILD, atrial fibrillation who presents for follow up in the [facility]. Patient was initially maintained on amiodarone but was diagnosed with ILD (followed by Dr Melvyn Novas) and she was transitioned to [facility] as a bridge to dofetilide while amiodarone washed out. Patient is on Eliquis for a CHADS2VASC score of 5.   On follow up today, patient presents for dofetilide loading. She denies any missed doses of anticoagulation in the past 3 weeks. She has discontinued zofran, cyclobenzaprine, primidone, and benadryl. She is in SR today.   Today, she denies symptoms of palpitations, chest pain, orthopnea, PND, lower extremity edema, dizziness, presyncope, syncope, snoring, daytime somnolence, bleeding, or neurologic sequela. The patient is tolerating medications without difficulties and is otherwise without complaint today.    Atrial Fibrillation Risk Factors:  she does not have symptoms or diagnosis of sleep apnea. she does not have a history of rheumatic fever.   she has a BMI of There is no height or weight on file to calculate BMI.. There were no vitals filed for this visit.   Family History  Problem Relation Age of Onset   Alzheimer's disease Mother        died at 24   Cancer Mother    Tremor Mother    Pneumonia Father    Heart disease Father    Diabetes Father    Heart attack Father        died at 48     Atrial Fibrillation Management history:  Previous antiarrhythmic drugs: amiodarone, Multaq  Previous cardioversions: 01/04/21 Previous ablations: none CHADS2VASC score: 5 Anticoagulation history: Eliquis   Past Medical History:  Diagnosis Date   (HFpEF) heart failure with preserved ejection  fraction ([place])    a. 09/2020 Echo: EF 65-70%, no rwma, GrI DD, nl RV fxn, mildly dil LA. Mild MR. Mild AS; b. 12/2020 Echo: EF 55-60%. Nl RV fxn.   Aortic insufficiency    a. 09/2020 Echo: No significant AI.   Chest pain    a. 10/2020 MV: EF 68%, no ischemia/scar.   Chronic respiratory failure (HCC)    DOE (dyspnea on exertion)    GERD (gastroesophageal reflux disease)    History of pneumonia    History of pulmonary embolism 2001   History of recurrent UTIs    History of stroke 2018   HTN (hypertension)    Hyperlipemia    Hypotension    a. On midodrine.   Mitral valve regurgitation    a. 09/2020 Echo: Mild MR.   Osteoporosis    PAF (paroxysmal atrial fibrillation) ([place])    a. CHA2DS2VASc = 7-->Eliquis; b. 12/2020 Amio started-->DCCV x 1; c. 01/2021 ED eval for AF-->DCCV x 1.   Tremor    Vitamin D deficiency    Past Surgical History:  Procedure Laterality Date   BALLOON DILATION N/A 11/28/2020   Procedure: BALLOON DILATION;  Surgeon: Rogene Houston, MD;  Location: [facility];  Service: Endoscopy;  Laterality: N/A;   BLADDER SURGERY     x 3   CARDIOVERSION N/A 01/04/2021   Procedure: CARDIOVERSION;  Surgeon: Arnoldo Lenis, MD;  Location: [facility] OR[facility];  Service: Endoscopy;  Laterality: N/A;   CERVICAL SPINE SURGERY     CHOLECYSTECTOMY     COLONOSCOPY  COLONOSCOPY WITH ESOPHAGOGASTRODUODENOSCOPY (EGD)     ESOPHAGOGASTRODUODENOSCOPY (EGD) WITH PROPOFOL N/A 11/28/2020   Procedure: ESOPHAGOGASTRODUODENOSCOPY (EGD) WITH PROPOFOL;  Surgeon: Rogene Houston, MD;  Location: [facility];  Service: Endoscopy;  Laterality: N/A;   LUMBAR SPINE SURGERY     REPLACEMENT TOTAL KNEE Left    RIGHT LUNG WEDGE REMOVAL     TONSILLECTOMY      Current Facility-Administered Medications  Medication Dose Route Frequency Provider Last Rate Last Admin   0.9 %  sodium chloride infusion  250 mL Intravenous PRN Fenton, Clint R, PA       acetaminophen (TYLENOL) tablet 650 mg  650 mg Oral Q8H PRN  Fenton, Clint R, PA       apixaban (ELIQUIS) tablet 5 mg  5 mg Oral BID Fenton, Clint R, PA       docusate sodium (COLACE) capsule 200 mg  200 mg Oral BID Fenton, Clint R, PA       dofetilide (TIKOSYN) capsule 500 mcg  500 mcg Oral BID Fenton, Clint R, PA       [START ON 01/22/2022] ferrous sulfate tablet 325 mg  325 mg Oral Q breakfast Fenton, Clint R, PA       [START ON 01/22/2022] furosemide (LASIX) tablet 40 mg  40 mg Oral Daily Fenton, Clint R, PA       gabapentin (NEURONTIN) capsule 200 mg  200 mg Oral See admin instructions Fenton, Clint R, PA       HYDROcodone-acetaminophen (NORCO/VICODIN) 5-325 MG per tablet 1 tablet  1 tablet Oral BID PRN Fenton, Clint R, PA       Melatonin TABS 10 mg  10 mg Oral QHS Fenton, Clint R, PA       midodrine (PROAMATINE) tablet 5 mg  5 mg Oral BID WC Fenton, Clint R, PA       [START ON 01/22/2022] mirabegron ER (MYRBETRIQ) tablet 25 mg  25 mg Oral Daily Fenton, Clint R, PA       [START ON 01/22/2022] pantoprazole (PROTONIX) EC tablet 80 mg  80 mg Oral Daily Fenton, Clint R, PA       [START ON 01/22/2022] predniSONE (DELTASONE) tablet 5 mg  5 mg Oral Daily Fenton, Clint R, PA       sodium chloride flush (NS) 0.9 % injection 3 mL  3 mL Intravenous Q12H Fenton, Clint R, PA       sodium chloride flush (NS) 0.9 % injection 3 mL  3 mL Intravenous PRN Fenton, Clint R, PA        Allergies  Allergen Reactions   Coumadin [Warfarin] Hives   Atorvastatin Other (See Comments)    Muscle aches   Ciprofloxacin    Macrobid [Nitrofurantoin]     Cause respiratory flare   Other Other (See Comments)    Not specified. In chart from nursing facility.    Sulfa Antibiotics Itching and Rash    Social History   Socioeconomic History   Marital status: Widowed    Spouse name: Not on file   Number of children: 3   Years of education: 12   Highest education level: High school graduate  Occupational History   Occupation: Retired  Tobacco Use   Smoking status: Never    Smokeless tobacco: Never  Vaping Use   Vaping Use: Never used  Substance and Sexual Activity   Alcohol use: Not Currently   Drug use: Never   Sexual activity: Not on file  Other Topics Concern  Not on file  Social History Narrative   Lives with her daughter.   Right-handed.   Rare caffeine use.   Social Determinants of Health   Financial Resource Strain: Not on file  Food Insecurity: Not on file  Transportation Needs: Not on file  Physical Activity: Not on file  Stress: Not on file  Social Connections: Not on file  Intimate Partner Violence: Not on file     ROS- All systems are reviewed and negative except as per the HPI above.  Physical Exam: Vitals:   01/21/22 1251  BP: 125/87  Pulse: 89  Resp: 18  Temp: (!) 97.4 F (36.3 C)  TempSrc: Oral  SpO2: 100%    GEN- The patient is a well appearing elderly female, alert and oriented x 3 today.   Head- normocephalic, atraumatic Eyes-  Sclera clear, conjunctiva pink Ears- hearing intact Oropharynx- clear Neck- supple  Lungs- Clear to ausculation bilaterally, on O2 nasal canula  Heart- Regular rate and rhythm, no murmurs, rubs or gallops  GI- soft, NT, ND, + BS Extremities- no clubbing, cyanosis, or edema MS- no significant deformity or atrophy Skin- no rash or lesion Psych- euthymic mood, full affect Neuro- strength and sensation are intact  Wt Readings from Last 3 Encounters:  01/21/22 79.7 kg  12/24/21 79.3 kg  08/20/21 77.5 kg    EKG today demonstrates  SR Vent. rate 99 BPM PR interval 146 ms QRS duration 68 ms QT/QTcB 330/423 ms  Echo 06/25/21 demonstrated   1. Left ventricular ejection fraction, by estimation, is 60 to 65%. The  left ventricle has normal function. The left ventricle has no regional  wall motion abnormalities. There is mild left ventricular hypertrophy.  Left ventricular diastolic parameters are consistent with Grade I diastolic dysfunction (impaired relaxation).   2. Right  ventricular systolic function is normal. The right ventricular  size is normal. Tricuspid regurgitation signal is inadequate for assessing  PA pressure.   3. Left atrial size was mildly dilated.   4. The mitral valve is abnormal. Mild mitral valve regurgitation. No  evidence of mitral stenosis.   5. The aortic valve is tricuspid. There is moderate calcification of the  aortic valve. There is moderate thickening of the aortic valve. Aortic  valve regurgitation is mild to moderate. Mild to moderate aortic valve  stenosis. Aortic valve mean gradient  measures 13.5 mmHg. Aortic valve peak gradient measures 24.9 mmHg. Aortic valve area, by VTI measures 1.45 cm.   6. IVC is small, suggesting low RA pressure and hypovolemia.   Epic records are reviewed at length today  CHA2DS2-VASc Score = 5  The patient's score is based upon: CHF History: 0 HTN History: 0 Diabetes History: 0 Stroke History: 2 Vascular Disease History: 0 Age Score: 2 Gender Score: 1       ASSESSMENT AND PLAN: 1. Paroxysmal Atrial Fibrillation (ICD10:  I48.0) The patient's CHA2DS2-VASc score is 5, indicating a 7.2% annual risk of stroke.   Patient presents for dofetilide admission. Continue Eliquis 5 mg BID, states no missed doses in the last 3 weeks. No recent benadryl use PharmD has screened medications, she has discontinued zofran, cyclobenzaprine, primidone, and benadryl. QTc in SR 423 ms Labs today show creatinine at 0.81, K+ 4.2 and mag 2.3, CrCl calculated at 71 mL/min Not on AV nodal agents given hypotension requiring midodrine.   2. Secondary Hypercoagulable State (ICD10:  D68.69) The patient is at significant risk for stroke/thromboembolism based upon her CHA2DS2-VASc Score of 5.  Continue  Apixaban (Eliquis).   3. ILD On O2 Would not rechallenge amiodarone. Followed by Dr Melvyn Novas.   Presents today for admission for Tikosyn.   Legrand Como [address]" [place], PA-C  01/21/2022 1:22 PM   I have seen and  examined this patient with Oda Kilts.  Agree with above, note added to reflect my findings.  Patient with a history of atrial fibrillation.  She is in sinus rhythm today.  Presenting for dofetilide load.  Has ILD and thus holding off on amiodarone.  Patient feeling well at baseline.  GEN: Well nourished, well developed, in no acute distress  HEENT: normal  Neck: no JVD, carotid bruits, or masses Cardiac: RRR; no murmurs, rubs, or gallops,no edema  Respiratory:  clear to auscultation bilaterally, normal work of breathing GI: soft, nontender, nondistended, + BS MS: no deformity or atrophy  Skin: warm and dry Neuro:  Strength and sensation are intact Psych: euthymic mood, full affect   Paroxysmal atrial fibrillation: CHA2DS2-VASc of 5.  Admitted for dofetilide load.  We Tivon Lemoine continue Eliquis 5 mg twice daily.  EKG after each dose.  We Manveer Gomes keep potassium greater than 4, magnesium greater than 2.  Tracie Lindbloom M. Seynabou Fults MD 01/21/2022 3:18 PM

## 2022-01-21 NOTE — Progress Notes (Signed)
Primary Care Physician: Celene Squibb, MD Primary Cardiologist: Dr Harrington Challenger Primary Electrophysiologist: Dr Lovena Le Referring Physician: Dr Aleatha Borer Monique Robles is a 81 y.o. female with a history of aortic insufficiency, CVA, HFpEF, hypotension, HLD, MR, ILD, atrial fibrillation who presents for follow up in the [facility]. Patient was initially maintained on amiodarone but was diagnosed with ILD (followed by Dr Melvyn Novas) and she was transitioned to [facility] as a bridge to dofetilide while amiodarone washed out. Patient is on Eliquis for a CHADS2VASC score of 5.   On follow up today, patient presents for dofetilide loading. She denies any missed doses of anticoagulation in the past 3 weeks. She has discontinued zofran, cyclobenzaprine, primidone, and benadryl. She is in SR today.   Today, she denies symptoms of palpitations, chest pain, orthopnea, PND, lower extremity edema, dizziness, presyncope, syncope, snoring, daytime somnolence, bleeding, or neurologic sequela. The patient is tolerating medications without difficulties and is otherwise without complaint today.    Atrial Fibrillation Risk Factors:  she does not have symptoms or diagnosis of sleep apnea. she does not have a history of rheumatic fever.   she has a BMI of Body mass index is 33.18 kg/m.Marland Kitchen Filed Weights   01/21/22 1142  Weight: 79.7 kg    Family History  Problem Relation Age of Onset   Alzheimer's disease Mother        died at 53   Cancer Mother    Tremor Mother    Pneumonia Father    Heart disease Father    Diabetes Father    Heart attack Father        died at 10     Atrial Fibrillation Management history:  Previous antiarrhythmic drugs: amiodarone, Multaq  Previous cardioversions: 01/04/21 Previous ablations: none CHADS2VASC score: 5 Anticoagulation history: Eliquis   Past Medical History:  Diagnosis Date   (HFpEF) heart failure with preserved ejection fraction ([place])    a.  09/2020 Echo: EF 65-70%, no rwma, GrI DD, nl RV fxn, mildly dil LA. Mild MR. Mild AS; b. 12/2020 Echo: EF 55-60%. Nl RV fxn.   Aortic insufficiency    a. 09/2020 Echo: No significant AI.   Chest pain    a. 10/2020 MV: EF 68%, no ischemia/scar.   Chronic respiratory failure (HCC)    DOE (dyspnea on exertion)    GERD (gastroesophageal reflux disease)    History of pneumonia    History of pulmonary embolism 2001   History of recurrent UTIs    History of stroke 2018   HTN (hypertension)    Hyperlipemia    Hypotension    a. On midodrine.   Mitral valve regurgitation    a. 09/2020 Echo: Mild MR.   Osteoporosis    PAF (paroxysmal atrial fibrillation) ([place])    a. CHA2DS2VASc = 7-->Eliquis; b. 12/2020 Amio started-->DCCV x 1; c. 01/2021 ED eval for AF-->DCCV x 1.   Tremor    Vitamin D deficiency    Past Surgical History:  Procedure Laterality Date   BALLOON DILATION N/A 11/28/2020   Procedure: BALLOON DILATION;  Surgeon: Rogene Houston, MD;  Location: [facility];  Service: Endoscopy;  Laterality: N/A;   BLADDER SURGERY     x 3   CARDIOVERSION N/A 01/04/2021   Procedure: CARDIOVERSION;  Surgeon: Arnoldo Lenis, MD;  Location: [facility] ORS;  Service: Endoscopy;  Laterality: N/A;   CERVICAL SPINE SURGERY     CHOLECYSTECTOMY     COLONOSCOPY  COLONOSCOPY WITH ESOPHAGOGASTRODUODENOSCOPY (EGD)     ESOPHAGOGASTRODUODENOSCOPY (EGD) WITH PROPOFOL N/A 11/28/2020   Procedure: ESOPHAGOGASTRODUODENOSCOPY (EGD) WITH PROPOFOL;  Surgeon: Rogene Houston, MD;  Location: [facility];  Service: Endoscopy;  Laterality: N/A;   LUMBAR SPINE SURGERY     REPLACEMENT TOTAL KNEE Left    RIGHT LUNG WEDGE REMOVAL     TONSILLECTOMY      Current Outpatient Medications  Medication Sig Dispense Refill   acetaminophen (TYLENOL) 650 MG CR tablet Take 1 tablet (650 mg total) by mouth every 8 (eight) hours as needed for pain. 12 tablet 0   apixaban (ELIQUIS) 5 MG TABS tablet Take 5 mg by mouth 2 (two) times  daily.     Cholecalciferol (VITAMIN D3) 1.25 MG (50000 UT) CAPS Take 1 capsule by mouth once a week. Monday at lunch     cyanocobalamin (,VITAMIN B-12,) 1000 MCG/ML injection Inject 1 mL (1,000 mcg total) into the muscle every 30 (thirty) days. 12 mL 1   docusate sodium (COLACE) 100 MG capsule Take 200 mg by mouth 2 (two) times daily.     ferrous sulfate 325 (65 FE) MG tablet Take 1 tablet (325 mg total) by mouth daily with breakfast. 90 tablet 3   furosemide (LASIX) 40 MG tablet Take 40 mg by mouth.     gabapentin (NEURONTIN) 100 MG capsule Take 200 mg by mouth See admin instructions. Take 200 mg in the morning and '200mg'$  at lunch     HYDROcodone-acetaminophen (NORCO/VICODIN) 5-325 MG tablet Take 1 tablet by mouth 2 (two) times daily as needed for moderate pain. 15 tablet 0   Melatonin 10 MG TABS Take 10 mg by mouth at bedtime.     midodrine (PROAMATINE) 5 MG tablet Take 1 tablet (5 mg total) by mouth 2 (two) times daily with a meal. 60 tablet 11   omeprazole (PRILOSEC) 40 MG capsule Take 40 mg by mouth daily.     predniSONE (DELTASONE) 5 MG tablet Take 5 mg by mouth daily.     Vibegron (GEMTESA) 75 MG TABS Take 1 capsule by mouth daily. (Patient taking differently: Take 1 capsule by mouth at bedtime.) 30 tablet 11   No current facility-administered medications for this encounter.    Allergies  Allergen Reactions   Coumadin [Warfarin] Hives   Atorvastatin Other (See Comments)    Muscle aches   Ciprofloxacin    Macrobid [Nitrofurantoin]     Cause respiratory flare   Other Other (See Comments)    Not specified. In chart from nursing facility.    Sulfa Antibiotics Itching and Rash    Social History   Socioeconomic History   Marital status: Widowed    Spouse name: Not on file   Number of children: 3   Years of education: 12   Highest education level: High school graduate  Occupational History   Occupation: Retired  Tobacco Use   Smoking status: Never   Smokeless tobacco: Never   Vaping Use   Vaping Use: Never used  Substance and Sexual Activity   Alcohol use: Not Currently   Drug use: Never   Sexual activity: Not on file  Other Topics Concern   Not on file  Social History Narrative   Lives with her daughter.   Right-handed.   Rare caffeine use.   Social Determinants of Health   Financial Resource Strain: Not on file  Food Insecurity: Not on file  Transportation Needs: Not on file  Physical Activity: Not on file  Stress:  Not on file  Social Connections: Not on file  Intimate Partner Violence: Not on file     ROS- All systems are reviewed and negative except as per the HPI above.  Physical Exam: Vitals:   01/21/22 1142  BP: 116/82  Pulse: 91  SpO2: 100%  Weight: 79.7 kg    GEN- The patient is a well appearing elderly female, alert and oriented x 3 today.   Head- normocephalic, atraumatic Eyes-  Sclera clear, conjunctiva pink Ears- hearing intact Oropharynx- clear Neck- supple  Lungs- Clear to ausculation bilaterally, on O2 nasal canula  Heart- Regular rate and rhythm, no murmurs, rubs or gallops  GI- soft, NT, ND, + BS Extremities- no clubbing, cyanosis, or edema MS- no significant deformity or atrophy Skin- no rash or lesion Psych- euthymic mood, full affect Neuro- strength and sensation are intact  Wt Readings from Last 3 Encounters:  01/21/22 79.7 kg  12/24/21 79.3 kg  08/20/21 77.5 kg    EKG today demonstrates  SR Vent. rate 99 BPM PR interval 146 ms QRS duration 68 ms QT/QTcB 330/423 ms  Echo 06/25/21 demonstrated   1. Left ventricular ejection fraction, by estimation, is 60 to 65%. The  left ventricle has normal function. The left ventricle has no regional  wall motion abnormalities. There is mild left ventricular hypertrophy.  Left ventricular diastolic parameters are consistent with Grade I diastolic dysfunction (impaired relaxation).   2. Right ventricular systolic function is normal. The right ventricular  size  is normal. Tricuspid regurgitation signal is inadequate for assessing  PA pressure.   3. Left atrial size was mildly dilated.   4. The mitral valve is abnormal. Mild mitral valve regurgitation. No  evidence of mitral stenosis.   5. The aortic valve is tricuspid. There is moderate calcification of the  aortic valve. There is moderate thickening of the aortic valve. Aortic  valve regurgitation is mild to moderate. Mild to moderate aortic valve  stenosis. Aortic valve mean gradient  measures 13.5 mmHg. Aortic valve peak gradient measures 24.9 mmHg. Aortic valve area, by VTI measures 1.45 cm.   6. IVC is small, suggesting low RA pressure and hypovolemia.   Epic records are reviewed at length today  CHA2DS2-VASc Score = 5  The patient's score is based upon: CHF History: 0 HTN History: 0 Diabetes History: 0 Stroke History: 2 Vascular Disease History: 0 Age Score: 2 Gender Score: 1       ASSESSMENT AND PLAN: 1. Paroxysmal Atrial Fibrillation (ICD10:  I48.0) The patient's CHA2DS2-VASc score is 5, indicating a 7.2% annual risk of stroke.   Patient presents for dofetilide admission. Continue Eliquis 5 mg BID, states no missed doses in the last 3 weeks. No recent benadryl use PharmD has screened medications, she has discontinued zofran, cyclobenzaprine, primidone, and benadryl. QTc in SR 423 ms Labs today show creatinine at 0.81, K+ 4.2 and mag 2.3, CrCl calculated at 71 mL/min Not on AV nodal agents given hypotension requiring midodrine.   2. Secondary Hypercoagulable State (ICD10:  D68.69) The patient is at significant risk for stroke/thromboembolism based upon her CHA2DS2-VASc Score of 5.  Continue Apixaban (Eliquis).   3. ILD On O2 Would not rechallenge amiodarone. Followed by Dr Melvyn Novas.   To be admitted later today once a bed becomes available.    [facility] [address] [place], [place] [address] [phone] 01/21/2022 12:22  PM

## 2022-01-21 NOTE — Care Management (Signed)
  Transition of Care [facility]) Screening Note   Patient Details  Name: Monique Robles Date of Birth: Dec 26, 1940   Transition of Care [facility]) CM/SW Contact:    Bethena Roys, RN Phone Number: 01/21/2022, 2:41 PM   Transition of Care Department [facility]) has reviewed the patient. Patient presented for Tikosyn Load-benefits check submitted for cost. Case Manager will follow for cost and pharmacy of choice as the patient progresses.

## 2022-01-21 NOTE — TOC Benefit Eligibility Note (Signed)
Patient Teacher, English as a foreign language completed.    The patient is currently admitted and upon discharge could be taking dofetilide (Tikosyn) 500 mcg.  The current 30 day co-pay is $0.00.   The patient is insured through [place], [place] Patient Advocate Specialist [place] Patient Advocate Team Direct Number: [phone]  Fax: [phone]

## 2022-01-22 LAB — BASIC METABOLIC PANEL
Anion gap: 8 (ref 5–15)
BUN: 13 mg/dL (ref 8–23)
CO2: 27 mmol/L (ref 22–32)
Calcium: 9.3 mg/dL (ref 8.9–10.3)
Chloride: 104 mmol/L (ref 98–111)
Creatinine, Ser: 0.79 mg/dL (ref 0.44–1.00)
GFR, Estimated: >60 mL/min (ref >60)
Glucose, Bld: 95 mg/dL (ref 70–99)
Potassium: 3.8 mmol/L (ref 3.5–5.1)
Sodium: 139 mmol/L (ref 135–145)

## 2022-01-22 LAB — MAGNESIUM: Magnesium: 2.1 mg/dL (ref 1.7–2.4)

## 2022-01-22 MED ORDER — DOFETILIDE 250 MCG PO CAPS
250.0000 ug | ORAL_CAPSULE | Freq: Two times a day (BID) | ORAL | Status: DC
  Administered 2022-01-22 – 2022-01-24 (×4): 250 ug via ORAL
  Filled 2022-01-22 (×4): qty 1

## 2022-01-22 MED ORDER — POTASSIUM CHLORIDE CRYS ER 20 MEQ PO TBCR
40.0000 meq | EXTENDED_RELEASE_TABLET | Freq: Once | ORAL | Status: AC
  Administered 2022-01-22: 40 meq via ORAL
  Filled 2022-01-22: qty 2

## 2022-01-22 MED ORDER — FUROSEMIDE 20 MG PO TABS
20.0000 mg | ORAL_TABLET | Freq: Every day | ORAL | Status: DC
  Administered 2022-01-23 – 2022-01-24 (×2): 20 mg via ORAL
  Filled 2022-01-22 (×2): qty 1

## 2022-01-22 NOTE — Progress Notes (Signed)
Mobility Specialist - Progress Note   01/22/22 0944  Mobility  Activity Transferred to/from [facility]  Level of Assistance +2 (takes two people)  Assistive Device None  Activity Response RN notified;Tolerated poorly  $Mobility charge 1 Mobility   Pt was received in bed requesting to use BSC. Pt was +2 for safety during transfer. Upon sitting at toilet pt had what daughter describes as "Episodes" where pt "Blanks out" and was not responsive to questions. After a few moments pt began to respond to questions. Pt has successful BM and was MaxA for pericare. Upon return to bed pt stated that using BSC drained her and made her feel "lifeless". Pt was returned to bed with all needs met and family present. RN notified.   Franki Monte  Mobility Specialist Please contact via Solicitor or Rehab office at [phone]

## 2022-01-22 NOTE — Plan of Care (Signed)
  Problem: Education: Goal: Knowledge of General Education information will improve Description: Including pain rating scale, medication(s)/side effects and non-pharmacologic comfort measures Outcome: Progressing   Problem: Health Behavior/Discharge Planning: Goal: Ability to manage health-related needs will improve Outcome: Progressing   Problem: Clinical Measurements: Goal: Ability to maintain clinical measurements within normal limits will improve Outcome: Progressing Goal: Will remain free from infection Outcome: Progressing Goal: Diagnostic test results will improve Outcome: Progressing Goal: Respiratory complications will improve Outcome: Progressing Goal: Cardiovascular complication will be avoided Outcome: Progressing   Problem: Activity: Goal: Risk for activity intolerance will decrease Outcome: Progressing   Problem: Nutrition: Goal: Adequate nutrition will be maintained Outcome: Progressing   Problem: Coping: Goal: Level of anxiety will decrease Outcome: Progressing   Problem: Elimination: Goal: Will not experience complications related to bowel motility Outcome: Progressing Goal: Will not experience complications related to urinary retention Outcome: Progressing   Problem: Pain Managment: Goal: General experience of comfort will improve Outcome: Progressing   Problem: Safety: Goal: Ability to remain free from injury will improve Outcome: Progressing   Problem: Skin Integrity: Goal: Risk for impaired skin integrity will decrease Outcome: Progressing   Problem: Education: Goal: Knowledge of disease or condition will improve Outcome: Progressing Goal: Understanding of medication regimen will improve Outcome: Progressing Goal: Individualized Educational Video(s) Outcome: Progressing   Problem: Activity: Goal: Ability to tolerate increased activity will improve Outcome: Progressing   Problem: Cardiac: Goal: Ability to achieve and maintain  adequate cardiopulmonary perfusion will improve Outcome: Progressing   Problem: Health Behavior/Discharge Planning: Goal: Ability to safely manage health-related needs after discharge will improve Outcome: Progressing   Problem: Education: Goal: Knowledge of General Education information will improve Description: Including pain rating scale, medication(s)/side effects and non-pharmacologic comfort measures Outcome: Progressing   Problem: Health Behavior/Discharge Planning: Goal: Ability to manage health-related needs will improve Outcome: Progressing   Problem: Clinical Measurements: Goal: Ability to maintain clinical measurements within normal limits will improve Outcome: Progressing Goal: Will remain free from infection Outcome: Progressing Goal: Diagnostic test results will improve Outcome: Progressing Goal: Respiratory complications will improve Outcome: Progressing Goal: Cardiovascular complication will be avoided Outcome: Progressing   Problem: Activity: Goal: Risk for activity intolerance will decrease Outcome: Progressing   Problem: Nutrition: Goal: Adequate nutrition will be maintained Outcome: Progressing   Problem: Coping: Goal: Level of anxiety will decrease Outcome: Progressing   Problem: Elimination: Goal: Will not experience complications related to bowel motility Outcome: Progressing Goal: Will not experience complications related to urinary retention Outcome: Progressing   Problem: Pain Managment: Goal: General experience of comfort will improve Outcome: Progressing   Problem: Safety: Goal: Ability to remain free from injury will improve Outcome: Progressing   Problem: Skin Integrity: Goal: Risk for impaired skin integrity will decrease Outcome: Progressing   Problem: Education: Goal: Knowledge of disease or condition will improve Outcome: Progressing Goal: Understanding of medication regimen will improve Outcome: Progressing Goal:  Individualized Educational Video(s) Outcome: Progressing   Problem: Activity: Goal: Ability to tolerate increased activity will improve Outcome: Progressing   Problem: Cardiac: Goal: Ability to achieve and maintain adequate cardiopulmonary perfusion will improve Outcome: Progressing   Problem: Health Behavior/Discharge Planning: Goal: Ability to safely manage health-related needs after discharge will improve Outcome: Progressing

## 2022-01-22 NOTE — Progress Notes (Signed)
Pharmacy: Dofetilide (Tikosyn) - Follow Up Assessment and Electrolyte Replacement  Pharmacy consulted to assist in monitoring and replacing electrolytes in this 81 y.o. female admitted on 01/21/2022 undergoing dofetilide initiation. First dofetilide dose: 01/21/22  Labs:    Component Value Date/Time   K 3.8 01/22/2022 0301   MG 2.1 01/22/2022 0301     Plan: Potassium: K 3.8-3.9:  Give KCl 40 mEq po x1   Magnesium: Mg > 2: No additional supplementation needed   Arrie Senate, PharmD, BCPS, [facility] Clinical Pharmacist [phone] Please check AMION for all [place] numbers 01/22/2022

## 2022-01-22 NOTE — Progress Notes (Addendum)
Electrophysiology Rounding Note  Patient Name: Monique Robles Date of Encounter: 01/22/2022  Primary Cardiologist: Dorris Carnes, MD  Electrophysiologist: None    Subjective   Pt remains in NSR on Tikosyn 500 mcg BID   QTc from EKG last pm shows stable QTc at ~460  The patient is doing well today.  At this time, the patient denies chest pain, shortness of breath, or any new concerns.  Inpatient Medications    Scheduled Meds:  apixaban  5 mg Oral BID   docusate sodium  200 mg Oral BID   dofetilide  500 mcg Oral BID   ferrous sulfate  325 mg Oral Q breakfast   furosemide  40 mg Oral Daily   gabapentin  200 mg Oral 2 times per day   melatonin  10 mg Oral QHS   midodrine  5 mg Oral BID WC   mirabegron ER  25 mg Oral Daily   pantoprazole  80 mg Oral Daily   potassium chloride  40 mEq Oral Once   predniSONE  5 mg Oral Daily   sodium chloride flush  3 mL Intravenous Q12H   Continuous Infusions:  sodium chloride     PRN Meds: sodium chloride, acetaminophen, HYDROcodone-acetaminophen, mouth rinse, sodium chloride flush   Vital Signs    Vitals:   01/21/22 1251 01/21/22 2000 01/22/22 0518  BP: 125/87 103/66 116/78  Pulse: 89 75 62  Resp: '18 16 15  '$ Temp: (!) 97.4 F (36.3 C) 97.7 F (36.5 C) 97.6 F (36.4 C)  TempSrc: Oral Oral Oral  SpO2: 100% 100% 100%  Height: '5\' 1"'$  (1.549 m)      Intake/Output Summary (Last 24 hours) at 01/22/2022 0258 Last data filed at 01/22/2022 0500 Gross per 24 hour  Intake --  Output 550 ml  Net -550 ml   There were no vitals filed for this visit.  Physical Exam    GEN- The patient is well appearing, alert and oriented x 3 today.   Head- normocephalic, atraumatic Eyes-  Sclera clear, conjunctiva pink Ears- hearing intact Oropharynx- clear Neck- supple Lungs- Clear to ausculation bilaterally, normal work of breathing Heart- Regular rate and rhythm, no murmurs, rubs or gallops GI- soft, NT, ND, + BS Extremities- no clubbing,  cyanosis, or edema Skin- no rash or lesion Psych- euthymic mood, full affect Neuro- strength and sensation are intact  Labs    CBC No results for input(s): "WBC", "NEUTROABS", "HGB", "HCT", "MCV", "PLT" in the last 72 hours. Basic Metabolic Panel Recent Labs    01/21/22 1155 01/22/22 0301  NA 143 139  K 4.2 3.8  CL 102 104  CO2 28 27  GLUCOSE 110* 95  BUN 16 13  CREATININE 0.81 0.79  CALCIUM 9.8 9.3  MG 2.3 2.1    Potassium  Date/Time Value Ref Range Status  01/22/2022 03:01 AM 3.8 3.5 - 5.1 mmol/L Final   Magnesium  Date/Time Value Ref Range Status  01/22/2022 03:01 AM 2.1 1.7 - 2.4 mg/dL Final    Comment:    Performed at [facility], [place] [address] [place], [place] [address]    Telemetry    NSR 70-80s (personally reviewed)  Radiology    No results found.   Patient Profile     Monique Robles is a 81 y.o. female with a past medical history significant for persistent atrial fibrillation.  They were admitted for tikosyn load.   Assessment & Plan    Persistent atrial fibrillation Pt remains in NSR  on Tikosyn 500 mcg BID  Continue Eliquis Creatinine, ser  0.79 (11/15 0301) Magnesium  2.1 (11/15 0301) Potassium3.8 (11/15 0301) Supplement K  Plan for home Friday if QTc remains stable.   For questions or updates, please contact [place] Please consult [email or web address] for contact info under Cardiology/STEMI.  Signed, Shirley Friar, PA-C  01/22/2022, 8:08 AM   EP Attending  Patient seen and examined. Agree with above. The patient is maintaining NSR. She is anxious appearing. She has had some spells where her bp is dropping and we will need to uptitrate her sodium intake. Her QT is elevated a bit and we will reduce her dose of dofetilide.   Carleene Overlie Cliffton Spradley,MD

## 2022-01-22 NOTE — TOC Initial Note (Addendum)
Transition of Care [facility]) - Initial/Assessment Note    Patient Details  Name: Monique Robles MRN: [id number] Date of Birth: 1940-06-07  Transition of Care [facility]) CM/SW Contact:    Milas Gain, [place] Phone Number: 01/22/2022, 11:52 AM  Clinical Narrative:                  Patient gave CSW permission to speak with her daughter Monique Robles at bedside. Monique Robles confirmed patient comes from [place] long term. Monique Robles confirms plan is for patient to return to [facility] when medically ready for dc. Monique Robles confirms patient will return back by [facility] transport. All questions answered. No further questions reported at this time. CSW called [facility] and LVM. CSW awaiting call back.CSW will continue to follow and assist with patients dc planning needs.       Patient Goals and CMS Choice        Expected Discharge Plan and Services                                                Prior Living Arrangements/Services                       Activities of Daily Living Home Assistive Devices/Equipment: Wheelchair ADL Screening (condition at time of admission) Patient's cognitive ability adequate to safely complete daily activities?: Yes Is the patient deaf or have difficulty hearing?: Yes Does the patient have difficulty seeing, even when wearing glasses/contacts?: Yes Does the patient have difficulty concentrating, remembering, or making decisions?: Yes Patient able to express need for assistance with ADLs?: Yes Does the patient have difficulty dressing or bathing?: Yes Independently performs ADLs?: No Communication: Independent Dressing (OT): Needs assistance Is this a change from baseline?: Pre-admission baseline Grooming: Needs assistance Is this a change from baseline?: Pre-admission baseline Feeding: Independent Bathing: Needs assistance Is this a change from baseline?: Pre-admission baseline Toileting: Needs assistance Is this a change from baseline?:  Pre-admission baseline In/Out Bed: Needs assistance Is this a change from baseline?: Pre-admission baseline Walks in Home: Dependent Is this a change from baseline?: Pre-admission baseline Does the patient have difficulty walking or climbing stairs?: Yes Weakness of Legs: Both Weakness of Arms/Hands: Both  Permission Sought/Granted                  Emotional Assessment              Admission diagnosis:  Paroxysmal atrial fibrillation ([place]) [I48.0] Patient Active Problem List   Diagnosis Date Noted   Paroxysmal atrial fibrillation ([place]) 01/21/2022   Secondary hypercoagulable state ([place]) 01/21/2022   Acute exacerbation of idiopathic pulmonary fibrosis ([place]) 06/25/2021   Iron deficiency 06/25/2021   B12 deficiency 06/25/2021   Generalized weakness 06/24/2021   Leukocytosis 06/24/2021   Lactic acidosis 06/24/2021   Elevated brain natriuretic peptide (BNP) level 06/14/2021   Thrombocytosis 06/14/2021   History of pulmonary embolism 06/14/2021   Acute on chronic respiratory failure with hypoxia ([place]) 06/13/2021   Atrial fibrillation, chronic ([place]) 01/16/2021   CHF (congestive heart failure) ([place]) 01/16/2021   Severe sepsis ([place]) 12/31/2020   Chronic respiratory failure with hypoxia ([place]) 12/06/2020   DOE (dyspnea on exertion) 12/05/2020   Esophageal dysphagia    Chronic lung disease    Drug-induced pneumonitis    Iron deficiency anemia due to chronic blood loss 11/23/2020  Class: Chronic   Acute respiratory failure with hypoxia (HCC) 11/21/2020   GERD (gastroesophageal reflux disease) 11/21/2020   Bronchitis 11/21/2020   Obesity (BMI 30-39.9) 11/21/2020   Insomnia 11/21/2020   Acute respiratory failure ([place]) 11/21/2020   Overactive bladder 08/24/2020   Acute cystitis without hematuria 08/24/2020   Chronic cystitis with hematuria 08/24/2020   Essential tremor 08/15/2019   Cerebrovascular accident (CVA) ([place]) 08/15/2019   Gait abnormality 08/15/2019   PCP:   Celene Squibb, MD Pharmacy:   [facility] [address] [place] - [address] [place] [address] Phone: [phone] Fax: [phone]  [place], [address] [place] [address] Phone: [phone] Fax: [phone]     Social Determinants of Health (SDOH) Interventions    Readmission Risk Interventions    11/29/2020    1:39 PM 11/28/2020   10:54 AM  Readmission Risk Prevention Plan  Transportation Screening  Complete  PCP or Specialist Appt within 5-7 Days Complete   Home Care Screening  Complete  Medication Review (RN CM)  Complete

## 2022-01-22 NOTE — Progress Notes (Signed)
Morning EKG reviewed     Shows remains in NSR with  borderline prolonged  QTc at ~490 ms.  Due to progression from initial dose/EKG, Dr. Lovena Le recommends decreasing to 250 mcg BID   Potassium3.8 (11/15 0301) Magnesium  2.1 (11/15 0301) Creatinine, ser  0.79 (11/15 0301)  Episode of orthostasis noted.  Continue midodrine. Liberalize sodium.   Shirley Friar, PA-C  01/22/2022 11:07 AM

## 2022-01-23 LAB — BASIC METABOLIC PANEL
Anion gap: 9 (ref 5–15)
BUN: 19 mg/dL (ref 8–23)
CO2: 28 mmol/L (ref 22–32)
Calcium: 9.3 mg/dL (ref 8.9–10.3)
Chloride: 104 mmol/L (ref 98–111)
Creatinine, Ser: 0.92 mg/dL (ref 0.44–1.00)
GFR, Estimated: >60 mL/min (ref >60)
Glucose, Bld: 99 mg/dL (ref 70–99)
Potassium: 4.2 mmol/L (ref 3.5–5.1)
Sodium: 141 mmol/L (ref 135–145)

## 2022-01-23 LAB — MAGNESIUM: Magnesium: 2.1 mg/dL (ref 1.7–2.4)

## 2022-01-23 NOTE — Progress Notes (Addendum)
Morning EKG reviewed     Shows remains in NSR at 70 bpm with stable QTc at ~470 ms.  Continue  Tikosyn 250 mcg BID.   Potassium4.2 (11/16 0225) Magnesium  2.1 (11/16 0225) Creatinine, ser  0.92 (11/16 0225)  Plan for home Friday if QTc remains stable   Shirley Friar, [place]  01/23/2022 11:11 AM   Agree. GT

## 2022-01-23 NOTE — Progress Notes (Addendum)
Electrophysiology Rounding Note  Patient Name: Monique Robles Date of Encounter: 01/23/2022  Primary Cardiologist: Dorris Carnes, MD  Electrophysiologist: None    Subjective   Pt remains in NSR on Tikosyn 250 mcg BID   QTc from EKG last pm shows stable QTc at ~480 - 490 ms  The patient is doing well today.  At this time, the patient denies chest pain, shortness of breath, or any new concerns.  Inpatient Medications    Scheduled Meds:  apixaban  5 mg Oral BID   docusate sodium  200 mg Oral BID   dofetilide  250 mcg Oral BID   ferrous sulfate  325 mg Oral Q breakfast   furosemide  20 mg Oral Daily   gabapentin  200 mg Oral 2 times per day   melatonin  10 mg Oral QHS   midodrine  5 mg Oral BID WC   mirabegron ER  25 mg Oral Daily   pantoprazole  80 mg Oral Daily   predniSONE  5 mg Oral Daily   sodium chloride flush  3 mL Intravenous Q12H   Continuous Infusions:  sodium chloride     PRN Meds: sodium chloride, acetaminophen, HYDROcodone-acetaminophen, mouth rinse, sodium chloride flush   Vital Signs    Vitals:   01/22/22 0948 01/22/22 0950 01/22/22 1500 01/22/22 2125  BP: (!) 79/38 (!) 115/51 116/69 126/73  Pulse:   80 68  Resp:   17   Temp:   97.6 F (36.4 C) 98 F (36.7 C)  TempSrc:   Oral Oral  SpO2:   100% 100%  Height:        Intake/Output Summary (Last 24 hours) at 01/23/2022 0746 Last data filed at 01/22/2022 1700 Gross per 24 hour  Intake 803 ml  Output 1350 ml  Net -547 ml   There were no vitals filed for this visit.  Physical Exam    GEN- The patient is well appearing, alert and oriented x 3 today.   Head- normocephalic, atraumatic Eyes-  Sclera clear, conjunctiva pink Ears- hearing intact Oropharynx- clear Neck- supple Lungs- Clear to ausculation bilaterally, normal work of breathing Heart- Regular rate and rhythm, no murmurs, rubs or gallops GI- soft, NT, ND, + BS Extremities- no clubbing, cyanosis, or edema Skin- no rash or  lesion Psych- euthymic mood, full affect Neuro- strength and sensation are intact  Labs    CBC No results for input(s): "WBC", "NEUTROABS", "HGB", "HCT", "MCV", "PLT" in the last 72 hours. Basic Metabolic Panel Recent Labs    01/22/22 0301 01/23/22 0225  NA 139 141  K 3.8 4.2  CL 104 104  CO2 27 28  GLUCOSE 95 99  BUN 13 19  CREATININE 0.79 0.92  CALCIUM 9.3 9.3  MG 2.1 2.1    Potassium  Date/Time Value Ref Range Status  01/23/2022 02:25 AM 4.2 3.5 - 5.1 mmol/L Final   Magnesium  Date/Time Value Ref Range Status  01/23/2022 02:25 AM 2.1 1.7 - 2.4 mg/dL Final    Comment:    Performed at [facility], [place] [address], [place], [place] [address]    Telemetry    NSR 60-70s (personally reviewed)  Radiology    No results found.   Patient Profile     Monique Robles is a 81 y.o. female with a past medical history significant for persistent atrial fibrillation.  They were admitted for tikosyn load.   Assessment & Plan    Persistent atrial fibrillation Pt remains in NSR on  Tikosyn 250 mcg BID  Continue Eliquis Creatinine, ser  0.92 (11/16 0225) Magnesium  2.1 (11/16 0225) Potassium4.2 (11/16 0225) No electrolyte supplementation needed  2. Orthostasis Lasix decreased. Follow  Plan for home Friday if QTc remains stable.   For questions or updates, please contact [place] Please consult [email or web address] for contact info under Cardiology/STEMI.  Signed, Shirley Friar, PA-C  01/23/2022, 7:46 AM   EP Attending  Patient seen and examined. Agree with above. She is maintaining NSR. Her QT is better after reduction of her dofetilide dose. She will be discharged home tomorrow if her ECG looks ok. Her bp is controlled.   Carleene Overlie Sharlene Mccluskey,MD

## 2022-01-23 NOTE — Progress Notes (Signed)
Patient with anticipated discharge back to her facility tomorrow. Daughter [name] [phone]) would like a call for discharge instructions if she is not present for transportation.

## 2022-01-23 NOTE — Plan of Care (Signed)
  Problem: Education: Goal: Knowledge of General Education information will improve Description: Including pain rating scale, medication(s)/side effects and non-pharmacologic comfort measures Outcome: Progressing   Problem: Health Behavior/Discharge Planning: Goal: Ability to manage health-related needs will improve Outcome: Progressing   Problem: Clinical Measurements: Goal: Respiratory complications will improve Outcome: Progressing   Problem: Clinical Measurements: Goal: Cardiovascular complication will be avoided Outcome: Progressing   Problem: Pain Managment: Goal: General experience of comfort will improve Outcome: Progressing   Problem: Safety: Goal: Ability to remain free from injury will improve Outcome: Progressing   Problem: Cardiac: Goal: Ability to achieve and maintain adequate cardiopulmonary perfusion will improve Outcome: Progressing

## 2022-01-23 NOTE — Progress Notes (Signed)
Pharmacy: Dofetilide (Tikosyn) - Follow Up Assessment and Electrolyte Replacement  Pharmacy consulted to assist in monitoring and replacing electrolytes in this 81 y.o. female admitted on 01/21/2022 undergoing dofetilide initiation. First dofetilide dose: 01/21/22  Labs:    Component Value Date/Time   K 4.2 01/23/2022 0225   MG 2.1 01/23/2022 0225     Plan: Potassium: K >/= 4: No additional supplementation needed  Magnesium: Mg > 2: No additional supplementation needed   Arrie Senate, PharmD, BCPS, [facility] Clinical Pharmacist [phone] Please check AMION for all [place] numbers 01/23/2022

## 2022-01-23 NOTE — TOC Progression Note (Signed)
Transition of Care [facility]) - Progression Note    Patient Details  Name: Macaria Bias MRN: [id number] Date of Birth: [birth year]  [facility]) CM/SW [place], [place] Phone Number: 01/23/2022, 10:24 AM  Clinical Narrative:     Update- CSW received callback. Roanna Epley confirmed patient can return back when medically ready for dc. All questions answered. No further questions reported at this time.CSW will continue to follow and assist with patients dc planning needs.  CSW LVM for [place] with [place] awaiting callback.  Expected Discharge Plan: [place] Barriers to Discharge: Continued Medical Work up  Expected Discharge Plan and Services Expected Discharge Plan: [place] In-house Referral: Clinical Social Work     Living arrangements for the past 2 months: [place]                                       Social Determinants of Health (SDOH) Interventions    Readmission Risk Interventions    11/29/2020    1:39 PM 11/28/2020   10:54 AM  Readmission Risk Prevention Plan  Transportation Screening  Complete  PCP or Specialist Appt within 5-7 Days Complete   Home Care Screening  Complete  Medication Review (RN CM)  Complete

## 2022-01-23 NOTE — Care Management (Signed)
01-23-22 1151 Patients initial Rx for Tikosyn can be filled via [facility] and refills will be via [facility]. No further needs identified.

## 2022-01-24 ENCOUNTER — Other Ambulatory Visit (HOSPITAL_COMMUNITY): Payer: Self-pay

## 2022-01-24 LAB — BASIC METABOLIC PANEL
Anion gap: 8 (ref 5–15)
BUN: 17 mg/dL (ref 8–23)
CO2: 26 mmol/L (ref 22–32)
Calcium: 9.1 mg/dL (ref 8.9–10.3)
Chloride: 105 mmol/L (ref 98–111)
Creatinine, Ser: 0.8 mg/dL (ref 0.44–1.00)
GFR, Estimated: >60 mL/min (ref >60)
Glucose, Bld: 101 mg/dL — ABNORMAL HIGH (ref 70–99)
Potassium: 4 mmol/L (ref 3.5–5.1)
Sodium: 139 mmol/L (ref 135–145)

## 2022-01-24 LAB — MAGNESIUM: Magnesium: 2.1 mg/dL (ref 1.7–2.4)

## 2022-01-24 MED ORDER — DOFETILIDE 250 MCG PO CAPS
250.0000 ug | ORAL_CAPSULE | Freq: Two times a day (BID) | ORAL | 6 refills | Status: DC
  Filled 2022-01-24 (×2): qty 60, 30d supply, fill #0

## 2022-01-24 MED ORDER — DOFETILIDE 250 MCG PO CAPS: 250.0000 ug | ORAL_CAPSULE | Freq: Two times a day (BID) | ORAL | 6 refills | Status: AC

## 2022-01-24 MED ORDER — FUROSEMIDE 20 MG PO TABS
20.0000 mg | ORAL_TABLET | Freq: Every day | ORAL | 6 refills | Status: AC
  Filled 2022-01-24: qty 30, 30d supply, fill #0

## 2022-01-24 NOTE — Discharge Summary (Addendum)
ELECTROPHYSIOLOGY PROCEDURE DISCHARGE SUMMARY    Patient ID: Monique Robles,  MRN: [id number], DOB/AGE: [birth year] 81 y.o.  Admit date: 01/21/2022 Discharge date: 01/24/2022  Primary Care Physician: Celene Squibb, MD  Primary Cardiologist: Dorris Carnes, MD  Electrophysiologist: Dr. Lovena Le   Primary Discharge Diagnosis:  1.  Paroxysmal atrial fibrillation status post Tikosyn loading this admission  Secondary Discharge Diagnosis:  2. Hypotension  Allergies  Allergen Reactions   Coumadin [Warfarin] Hives   Atorvastatin Other (See Comments)    Muscle aches   Ciprofloxacin Other (See Comments)    unk   Macrobid [Nitrofurantoin]     Cause respiratory flare   Nsaids Other (See Comments)    unk   Other Other (See Comments)    Not specified. In chart from nursing facility.    Sulfa Antibiotics Itching and Rash     Procedures This Admission:  1.  Tikosyn loading   Brief HPI: Monique Robles is a 81 y.o. female with a past medical history as noted above.  They were referred to EP for treatment options of atrial fibrillation.  Risks, benefits, and alternatives to Tikosyn were reviewed with the patient who wished to proceed with admission for loading.  Hospital Course:  The patient was admitted and Tikosyn was initiated.  Renal function and electrolytes were followed during the hospitalization.  Her QTc prolonged somewhat  and her dose was decreased to 250 mcg with improvement and stabilization. The patient presented in sinus rhythm and did not require cardioversion. They were monitored on telemetry up to discharge.   Pt had episode of orthostasis and her lasix was decreased. We recommend some liberalization of salt in her diet as well.  On the day of discharge, they were examined by Dr. Lovena Le  who considered them stable for discharge to home.  Follow-up has been arranged with the Atrial Fibrillation clinic in approximately 1 week.   Physical Exam: Vitals:   01/23/22 0811  01/23/22 1700 01/23/22 2007 01/24/22 0511  BP: (!) 150/82 (!) 145/73 139/62 (!) 143/61  Pulse: 78 (!) 59 71 66  Resp:  '17 19 18  '$ Temp:  97.7 F (36.5 C) 98.1 F (36.7 C) 97.7 F (36.5 C)  TempSrc:  Oral Oral Oral  SpO2: 100% 100% 93% 100%  Height:        GEN- The patient is well appearing, alert and oriented x 3 today.   HEENT: normocephalic, atraumatic; sclera clear, conjunctiva pink; hearing intact; oropharynx clear; neck supple, no JVP Lymph- no cervical lymphadenopathy Lungs- Clear to ausculation bilaterally, normal work of breathing.  No wheezes, rales, rhonchi Heart- Regular rate and rhythm, no murmurs, rubs or gallops, PMI not laterally displaced GI- soft, non-tender, non-distended, bowel sounds present, no hepatosplenomegaly Extremities- no clubbing, cyanosis, or edema; DP/PT/radial pulses 2+ bilaterally MS- no significant deformity or atrophy Skin- warm and dry, no rash or lesion Psych- euthymic mood, full affect Neuro- strength and sensation are intact  Labs:   Lab Results  Component Value Date   WBC 11.9 (H) 06/26/2021   HGB 9.1 (L) 06/26/2021   HCT 28.6 (L) 06/26/2021   MCV 92.6 06/26/2021   PLT 413 (H) 06/26/2021    Recent Labs  Lab 01/24/22 0302  NA 139  K 4.0  CL 105  CO2 26  BUN 17  CREATININE 0.80  CALCIUM 9.1  GLUCOSE 101*    Discharge Medications:  Allergies as of 01/24/2022       Reactions   Coumadin [warfarin] Hives  Atorvastatin Other (See Comments)   Muscle aches   Ciprofloxacin Other (See Comments)   unk   Macrobid [nitrofurantoin]    Cause respiratory flare   Nsaids Other (See Comments)   unk   Other Other (See Comments)   Not specified. In chart from nursing facility.    Sulfa Antibiotics Itching, Rash        Medication List     TAKE these medications    acetaminophen 650 MG CR tablet Commonly known as: TYLENOL Take 1 tablet (650 mg total) by mouth every 8 (eight) hours as needed for pain. What changed: when to  take this   apixaban 5 MG Tabs tablet Commonly known as: ELIQUIS Take 5 mg by mouth 2 (two) times daily.   carboxymethylcellul-glycerin 0.5-0.9 % ophthalmic solution Commonly known as: REFRESH OPTIVE Place 1 drop into both eyes in the morning and at bedtime.   cyanocobalamin 1000 MCG/ML injection Commonly known as: VITAMIN B12 Inject 1 mL (1,000 mcg total) into the muscle every 30 (thirty) days.   docusate sodium 100 MG capsule Commonly known as: COLACE Take 200 mg by mouth 2 (two) times daily.   dofetilide 250 MCG capsule Commonly known as: TIKOSYN Take 1 capsule (250 mcg total) by mouth 2 (two) times daily.   doxycycline 100 MG tablet Commonly known as: ADOXA Take 100 mg by mouth 2 (two) times daily.   ferrous sulfate 325 (65 FE) MG tablet Take 1 tablet (325 mg total) by mouth daily with breakfast.   furosemide 20 MG tablet Commonly known as: LASIX Take 1 tablet (20 mg total) by mouth daily. Start taking on: January 25, 2022 What changed:  medication strength how much to take when to take this   gabapentin 100 MG capsule Commonly known as: NEURONTIN Take 200 mg by mouth See admin instructions. Take 200 mg in the morning and '200mg'$  at lunch   Gemtesa 75 MG Tabs Generic drug: Vibegron Take 1 capsule by mouth daily. What changed: when to take this   HYDROcodone-acetaminophen 5-325 MG tablet Commonly known as: NORCO/VICODIN Take 1 tablet by mouth 2 (two) times daily as needed for moderate pain. What changed: when to take this   melatonin 3 MG Tabs tablet Take 9 mg by mouth at bedtime.   midodrine 5 MG tablet Commonly known as: PROAMATINE Take 1 tablet (5 mg total) by mouth 2 (two) times daily with a meal.   nystatin cream Commonly known as: MYCOSTATIN Apply topically.   nystatin ointment Commonly known as: MYCOSTATIN Apply topically.   omeprazole 40 MG capsule Commonly known as: PRILOSEC Take 40 mg by mouth daily.   predniSONE 5 MG tablet Commonly  known as: DELTASONE Take 5 mg by mouth daily.   THERATRUM COMPLETE PO Take 1 tablet by mouth daily.   Vitamin D3 1.25 MG (50000 UT) Caps Take 1 capsule by mouth once a week. Monday at lunch        Disposition:    Follow-up Information     [place] Follow up.   Specialty: Cardiology Why: on 11/27 at 330 for post hospital tikosyn check Contact information: [address] [id number] [place] [address] [phone]                Duration of Discharge Encounter: Greater than 30 minutes including physician time.  Jacalyn Lefevre, PA-C  01/24/2022 11:45 AM  EP Attending  Patient seen and examined. Agree with the findings as noted above. The  patient is maintaining NSR and her QT has decreased to around 460. DC to SNF as above. Usual followup.  Carleene Overlie Annalisa Colonna,MD

## 2022-01-24 NOTE — Progress Notes (Signed)
EKG from yesterday evening 01/23/2022 reviewed     Shows remains in NSR with stable QTc at ~470 ms.  Continue  Tikosyn 250 mcg BID.   Potassium4.0 (11/17 0302) Magnesium  2.1 (11/17 0302) Creatinine, ser  0.80 (11/17 0302)  Plan for home this afternoon if QTc remains stable.   Shirley Friar, PA-C  01/24/2022 7:00 AM

## 2022-01-24 NOTE — Care Management Important Message (Signed)
Important Message  Patient Details  Name: Monique Robles MRN: [id number] Date of Birth: 12-18-1940   Medicare Important Message Given:  Yes     Shelda Altes 01/24/2022, 9:40 AM

## 2022-01-24 NOTE — Progress Notes (Signed)
Pharmacy: Dofetilide (Tikosyn) - Follow Up Assessment and Electrolyte Replacement  Pharmacy consulted to assist in monitoring and replacing electrolytes in this 81 y.o. female admitted on 01/21/2022 undergoing dofetilide initiation. First dofetilide dose: 01/21/22  Labs:    Component Value Date/Time   K 4.0 01/24/2022 0302   MG 2.1 01/24/2022 0302     Plan: Potassium: K >/= 4: No additional supplementation needed  Magnesium: Mg > 2: No additional supplementation needed   Erin Hearing PharmD., BCPS Clinical Pharmacist 01/24/2022 11:07 AM

## 2022-01-24 NOTE — Progress Notes (Signed)
Secure chat received that facility was requesting script, despite discharge summary and 30 day supply of medication in hand.   Script for [employer] and RN to fax to facility.   Legrand Como [address]" [place], PA-C  01/24/2022 4:22 PM

## 2022-01-24 NOTE — NC FL2 (Addendum)
[place] MEDICAID FL2 LEVEL OF CARE SCREENING TOOL     IDENTIFICATION  Patient Name: Monique Robles Birthdate: 07/15/1940 Sex: female Admission Date (Current Location): 01/21/2022  [facility] and [place] Number:  Herbalist and Address:  [facility], [place] [address] [place], [place] [address]      Provider Number: [id number]  Attending Physician Name and Address:  Evans Lance, MD  Relative Name and Phone Number:       Current Level of Care: Hospital Recommended Level of Care: [place] Prior Approval Number:    Date Approved/Denied:   PASRR Number: [id number]  Discharge Plan: SNF    Current Diagnoses: Patient Active Problem List   Diagnosis Date Noted   Paroxysmal atrial fibrillation ([place]) 01/21/2022   Secondary hypercoagulable state ([place]) 01/21/2022   Acute exacerbation of idiopathic pulmonary fibrosis ([place]) 06/25/2021   Iron deficiency 06/25/2021   B12 deficiency 06/25/2021   Generalized weakness 06/24/2021   Leukocytosis 06/24/2021   Lactic acidosis 06/24/2021   Elevated brain natriuretic peptide (BNP) level 06/14/2021   Thrombocytosis 06/14/2021   History of pulmonary embolism 06/14/2021   Acute on chronic respiratory failure with hypoxia (HCC) 06/13/2021   Atrial fibrillation, chronic ([place]) 01/16/2021   CHF (congestive heart failure) ([place]) 01/16/2021   Severe sepsis ([place]) 12/31/2020   Chronic respiratory failure with hypoxia ([place]) 12/06/2020   DOE (dyspnea on exertion) 12/05/2020   Esophageal dysphagia    Chronic lung disease    Drug-induced pneumonitis    Iron deficiency anemia due to chronic blood loss 11/23/2020   Acute respiratory failure with hypoxia ([place]) 11/21/2020   GERD (gastroesophageal reflux disease) 11/21/2020   Bronchitis 11/21/2020   Obesity (BMI 30-39.9) 11/21/2020   Insomnia 11/21/2020   Acute respiratory failure ([place]) 11/21/2020   Overactive bladder 08/24/2020   Acute cystitis  without hematuria 08/24/2020   Chronic cystitis with hematuria 08/24/2020   Essential tremor 08/15/2019   Cerebrovascular accident (CVA) ([place]) 08/15/2019   Gait abnormality 08/15/2019    Orientation RESPIRATION BLADDER Height & Weight     Self, Time, Situation, Place  O2 (Nasal Cannula 3 liters) Incontinent, External catheter (External Urinary Catheter) Weight:   Height:  '5\' 1"'$  (154.9 cm)  BEHAVIORAL SYMPTOMS/MOOD NEUROLOGICAL BOWEL NUTRITION STATUS      Continent (WDL) Diet (Please see discharge summary)  AMBULATORY STATUS COMMUNICATION OF NEEDS Skin     Verbally Other (Comment) (WDL)                       Personal Care Assistance Level of Assistance  Bathing, Feeding, Dressing Bathing Assistance: Limited assistance Feeding assistance: Independent Dressing Assistance: Limited assistance     Functional Limitations Info  Sight, Hearing, Speech Sight Info: Impaired Hearing Info: Adequate Speech Info: Adequate    SPECIAL CARE FACTORS FREQUENCY                       Contractures Contractures Info: Not present    Additional Factors Info  Code Status, Allergies, Isolation Precautions Code Status Info: FULL Allergies Info: Coumadin (warfarin),Atorvastatin,Ciprofloxacin,Macrobid (nitrofurantoin),Nsaids,Other, Not specified. In chart from [place]     Isolation Precautions Info: MDRO onset date 04/01/2021, VRE onset date 11/01/2021     Current Medications (01/24/2022):  This is the current hospital active medication list Current Facility-Administered Medications  Medication Dose Route Frequency Provider Last Rate Last Admin   0.9 %  sodium chloride infusion  250 mL Intravenous PRN  Fenton, Clint R, PA       acetaminophen (TYLENOL) tablet 650 mg  650 mg Oral Q8H PRN Fenton, Clint R, PA   650 mg at 01/23/22 1312   apixaban (ELIQUIS) tablet 5 mg  5 mg Oral BID Fenton, Clint R, PA   5 mg at 01/24/22 0831   docusate sodium (COLACE) capsule 200  mg  200 mg Oral BID Fenton, Clint R, PA   200 mg at 01/24/22 0831   dofetilide (TIKOSYN) capsule 250 mcg  250 mcg Oral BID Shirley Friar, PA-C   250 mcg at 01/24/22 0831   ferrous sulfate tablet 325 mg  325 mg Oral Q breakfast Fenton, Clint R, PA   325 mg at 01/24/22 0831   furosemide (LASIX) tablet 20 mg  20 mg Oral Daily Shirley Friar, PA-C   20 mg at 01/24/22 0831   gabapentin (NEURONTIN) capsule 200 mg  200 mg Oral 2 times per day Evans Lance, MD   200 mg at 01/24/22 0831   HYDROcodone-acetaminophen (NORCO/VICODIN) 5-325 MG per tablet 1 tablet  1 tablet Oral BID PRN Fenton, Clint R, PA   1 tablet at 01/23/22 2012   melatonin tablet 10 mg  10 mg Oral QHS Fenton, Clint R, PA   10 mg at 01/23/22 2214   midodrine (PROAMATINE) tablet 5 mg  5 mg Oral BID WC Fenton, Clint R, PA   5 mg at 01/24/22 0831   mirabegron ER (MYRBETRIQ) tablet 25 mg  25 mg Oral Daily Fenton, Clint R, PA   25 mg at 01/24/22 0830   Oral care mouth rinse  15 mL Mouth Rinse PRN Evans Lance, MD       pantoprazole (PROTONIX) EC tablet 80 mg  80 mg Oral Daily Fenton, Clint R, PA   80 mg at 01/24/22 0831   predniSONE (DELTASONE) tablet 5 mg  5 mg Oral Daily Fenton, Clint R, PA   5 mg at 01/24/22 0831   sodium chloride flush (NS) 0.9 % injection 3 mL  3 mL Intravenous Q12H Fenton, Clint R, PA   3 mL at 01/23/22 2249   sodium chloride flush (NS) 0.9 % injection 3 mL  3 mL Intravenous PRN Fenton, Clint R, PA         Discharge Medications: Please see discharge summary for a list of discharge medications.  Relevant Imaging Results:  Relevant Lab Results:   Additional Information [id number]  Milas Gain, LCSWA

## 2022-01-24 NOTE — TOC Transition Note (Addendum)
Transition of Care [facility]) - CM/SW Discharge Note   Patient Details  Name: Monique Robles MRN: [id number] Date of Birth: [birth year]  Transition [facility] CM/SW Contact:  Milas Gain, Romeo Phone Number: 01/24/2022, 1:23 PM   Clinical Narrative:     Patient will DC to: [facility]  Anticipated DC date: 01/24/2022  Family notified: [name]  Transport by: [facility]   ?  Per MD patient ready for DC to [facility] . RN, patient, patient's family, and facility notified of DC. Discharge Summary sent to facility. RN given number for report [phone] RM# 172 Bed 1. DC packet on chart.Patient will transport by [employer].  CSW signing off.   Final next level of care: Skilled Nursing Facility Barriers to Discharge: No Barriers Identified   Patient Goals and CMS Choice Patient states their goals for this hospitalization and ongoing recovery are:: SNF CMS Medicare.gov Compare Post Acute Care list provided to:: Patient Represenative (must comment) (Patients daughter [name]) Choice offered to / list presented to : Adult Children (Patients daughter [name])  Discharge Placement              Patient chooses bed at:  [facility]) Patient to be transferred to facility by: [facility] Name of family member notified: [name] Patient and family notified of of transfer: 01/24/22  Discharge Plan and Services In-house Referral: Clinical Social Work                                   Social Determinants of Health (SDOH) Interventions     Readmission Risk Interventions    11/29/2020    1:39 PM 11/28/2020   10:54 AM  Readmission Risk Prevention Plan  Transportation Screening  Complete  PCP or Specialist Appt within 5-7 Days Complete   Home Care Screening  Complete  Medication Review (RN CM)  Complete

## 2022-01-26 DIAGNOSIS — I4821 Permanent atrial fibrillation: Secondary | ICD-10-CM | POA: Diagnosis not present

## 2022-01-26 DIAGNOSIS — I5032 Chronic diastolic (congestive) heart failure: Secondary | ICD-10-CM | POA: Diagnosis not present

## 2022-01-26 DIAGNOSIS — I10 Essential (primary) hypertension: Secondary | ICD-10-CM | POA: Diagnosis not present

## 2022-01-26 DIAGNOSIS — G8194 Hemiplegia, unspecified affecting left nondominant side: Secondary | ICD-10-CM | POA: Diagnosis not present

## 2022-01-27 DIAGNOSIS — I480 Paroxysmal atrial fibrillation: Secondary | ICD-10-CM | POA: Diagnosis not present

## 2022-01-27 DIAGNOSIS — M6281 Muscle weakness (generalized): Secondary | ICD-10-CM | POA: Diagnosis not present

## 2022-01-27 DIAGNOSIS — J84112 Idiopathic pulmonary fibrosis: Secondary | ICD-10-CM | POA: Diagnosis not present

## 2022-01-27 DIAGNOSIS — R2689 Other abnormalities of gait and mobility: Secondary | ICD-10-CM | POA: Diagnosis not present

## 2022-01-27 DIAGNOSIS — R531 Weakness: Secondary | ICD-10-CM | POA: Diagnosis not present

## 2022-01-28 DIAGNOSIS — I480 Paroxysmal atrial fibrillation: Secondary | ICD-10-CM | POA: Diagnosis not present

## 2022-01-28 DIAGNOSIS — M6281 Muscle weakness (generalized): Secondary | ICD-10-CM | POA: Diagnosis not present

## 2022-01-28 DIAGNOSIS — R2689 Other abnormalities of gait and mobility: Secondary | ICD-10-CM | POA: Diagnosis not present

## 2022-01-28 DIAGNOSIS — J84112 Idiopathic pulmonary fibrosis: Secondary | ICD-10-CM | POA: Diagnosis not present

## 2022-01-28 DIAGNOSIS — R531 Weakness: Secondary | ICD-10-CM | POA: Diagnosis not present

## 2022-01-29 DIAGNOSIS — R531 Weakness: Secondary | ICD-10-CM | POA: Diagnosis not present

## 2022-01-29 DIAGNOSIS — J84112 Idiopathic pulmonary fibrosis: Secondary | ICD-10-CM | POA: Diagnosis not present

## 2022-01-29 DIAGNOSIS — I480 Paroxysmal atrial fibrillation: Secondary | ICD-10-CM | POA: Diagnosis not present

## 2022-01-29 DIAGNOSIS — M6281 Muscle weakness (generalized): Secondary | ICD-10-CM | POA: Diagnosis not present

## 2022-01-29 DIAGNOSIS — R2689 Other abnormalities of gait and mobility: Secondary | ICD-10-CM | POA: Diagnosis not present

## 2022-01-30 DIAGNOSIS — M6281 Muscle weakness (generalized): Secondary | ICD-10-CM | POA: Diagnosis not present

## 2022-01-30 DIAGNOSIS — R2689 Other abnormalities of gait and mobility: Secondary | ICD-10-CM | POA: Diagnosis not present

## 2022-01-30 DIAGNOSIS — R531 Weakness: Secondary | ICD-10-CM | POA: Diagnosis not present

## 2022-01-30 DIAGNOSIS — J84112 Idiopathic pulmonary fibrosis: Secondary | ICD-10-CM | POA: Diagnosis not present

## 2022-01-30 DIAGNOSIS — I480 Paroxysmal atrial fibrillation: Secondary | ICD-10-CM | POA: Diagnosis not present

## 2022-01-31 DIAGNOSIS — R531 Weakness: Secondary | ICD-10-CM | POA: Diagnosis not present

## 2022-01-31 DIAGNOSIS — R2689 Other abnormalities of gait and mobility: Secondary | ICD-10-CM | POA: Diagnosis not present

## 2022-01-31 DIAGNOSIS — I480 Paroxysmal atrial fibrillation: Secondary | ICD-10-CM | POA: Diagnosis not present

## 2022-01-31 DIAGNOSIS — M6281 Muscle weakness (generalized): Secondary | ICD-10-CM | POA: Diagnosis not present

## 2022-01-31 DIAGNOSIS — J84112 Idiopathic pulmonary fibrosis: Secondary | ICD-10-CM | POA: Diagnosis not present

## 2022-02-01 DIAGNOSIS — R531 Weakness: Secondary | ICD-10-CM | POA: Diagnosis not present

## 2022-02-01 DIAGNOSIS — M6281 Muscle weakness (generalized): Secondary | ICD-10-CM | POA: Diagnosis not present

## 2022-02-01 DIAGNOSIS — I480 Paroxysmal atrial fibrillation: Secondary | ICD-10-CM | POA: Diagnosis not present

## 2022-02-01 DIAGNOSIS — R2689 Other abnormalities of gait and mobility: Secondary | ICD-10-CM | POA: Diagnosis not present

## 2022-02-01 DIAGNOSIS — J84112 Idiopathic pulmonary fibrosis: Secondary | ICD-10-CM | POA: Diagnosis not present

## 2022-02-03 ENCOUNTER — Encounter (HOSPITAL_COMMUNITY): Payer: Self-pay | Admitting: Physician Assistant

## 2022-02-03 ENCOUNTER — Ambulatory Visit (HOSPITAL_COMMUNITY)
Admit: 2022-02-03 | Discharge: 2022-02-03 | Disposition: A | Discharge status: Home / Self Care | Payer: Medicare Other | Source: Ambulatory Visit | Attending: Physician Assistant | Admitting: Physician Assistant

## 2022-02-03 VITALS — BP 130/90 | HR 80 | Ht 61.0 in | Wt 176.0 lb

## 2022-02-03 DIAGNOSIS — M6281 Muscle weakness (generalized): Secondary | ICD-10-CM | POA: Diagnosis not present

## 2022-02-03 DIAGNOSIS — Z79899 Other long term (current) drug therapy: Secondary | ICD-10-CM | POA: Insufficient documentation

## 2022-02-03 DIAGNOSIS — I480 Paroxysmal atrial fibrillation: Secondary | ICD-10-CM | POA: Diagnosis not present

## 2022-02-03 DIAGNOSIS — I110 Hypertensive heart disease with heart failure: Secondary | ICD-10-CM | POA: Diagnosis not present

## 2022-02-03 DIAGNOSIS — D6869 Other thrombophilia: Secondary | ICD-10-CM | POA: Insufficient documentation

## 2022-02-03 DIAGNOSIS — E785 Hyperlipidemia, unspecified: Secondary | ICD-10-CM | POA: Diagnosis not present

## 2022-02-03 DIAGNOSIS — Z7901 Long term (current) use of anticoagulants: Secondary | ICD-10-CM | POA: Insufficient documentation

## 2022-02-03 DIAGNOSIS — Z8673 Personal history of transient ischemic attack (TIA), and cerebral infarction without residual deficits: Secondary | ICD-10-CM | POA: Diagnosis not present

## 2022-02-03 DIAGNOSIS — J849 Interstitial pulmonary disease, unspecified: Secondary | ICD-10-CM | POA: Insufficient documentation

## 2022-02-03 DIAGNOSIS — R531 Weakness: Secondary | ICD-10-CM | POA: Diagnosis not present

## 2022-02-03 DIAGNOSIS — J84112 Idiopathic pulmonary fibrosis: Secondary | ICD-10-CM | POA: Diagnosis not present

## 2022-02-03 DIAGNOSIS — I5032 Chronic diastolic (congestive) heart failure: Secondary | ICD-10-CM | POA: Diagnosis not present

## 2022-02-03 DIAGNOSIS — R2689 Other abnormalities of gait and mobility: Secondary | ICD-10-CM | POA: Diagnosis not present

## 2022-02-03 LAB — BASIC METABOLIC PANEL
Anion gap: 15 (ref 5–15)
BUN: 15 mg/dL (ref 8–23)
CO2: 20 mmol/L — ABNORMAL LOW (ref 22–32)
Calcium: 9.5 mg/dL (ref 8.9–10.3)
Chloride: 105 mmol/L (ref 98–111)
Creatinine, Ser: 0.84 mg/dL (ref 0.44–1.00)
GFR, Estimated: >60 mL/min (ref >60)
Glucose, Bld: 100 mg/dL — ABNORMAL HIGH (ref 70–99)
Potassium: 4.3 mmol/L (ref 3.5–5.1)
Sodium: 140 mmol/L (ref 135–145)

## 2022-02-03 LAB — MAGNESIUM: Magnesium: 2.3 mg/dL (ref 1.7–2.4)

## 2022-02-03 NOTE — Progress Notes (Signed)
Primary Care Physician: Celene Squibb, MD Primary Cardiologist: Dr Harrington Challenger Primary Electrophysiologist: Dr Lovena Le Referring Physician: Dr Aleatha Borer Monique is a 81 y.o. female with a history of aortic insufficiency, CVA, HFpEF, hypotension, HLD, MR, ILD, atrial fibrillation who presents for follow up in the [facility]. Patient was initially maintained on amiodarone but was diagnosed with ILD (followed by Dr Melvyn Novas) and she was transitioned to [facility] as a bridge to dofetilide while amiodarone washed out. Patient is on Eliquis for a CHADS2VASC score of 5.   On follow up today, patient is s/p dofetilide loading 11/14-11/17/23. She remains in SR today. She has had some fatigue but was also recently started on sertraline.   Today, she denies symptoms of palpitations, chest pain, orthopnea, PND, lower extremity edema, dizziness, presyncope, syncope, snoring, daytime somnolence, bleeding, or neurologic sequela. The patient is tolerating medications without difficulties and is otherwise without complaint today.    Atrial Fibrillation Risk Factors:  she does not have symptoms or diagnosis of sleep apnea. she does not have a history of rheumatic fever.   she has a BMI of Body mass index is 33.25 kg/m.Marland Kitchen Filed Weights   02/03/22 1555  Weight: 79.8 kg    Family History  Problem Relation Age of Onset   Alzheimer's disease Mother        died at 17   Cancer Mother    Tremor Mother    Pneumonia Father    Heart disease Father    Diabetes Father    Heart attack Father        died at 36     Atrial Fibrillation Management history:  Previous antiarrhythmic drugs: amiodarone, Multaq, dofetilide   Previous cardioversions: 01/04/21 Previous ablations: none CHADS2VASC score: 5 Anticoagulation history: Eliquis   Past Medical History:  Diagnosis Date   (HFpEF) heart failure with preserved ejection fraction ([place])    a. 09/2020 Echo: EF 65-70%, no rwma, GrI DD, nl  RV fxn, mildly dil LA. Mild MR. Mild AS; b. 12/2020 Echo: EF 55-60%. Nl RV fxn.   Aortic insufficiency    a. 09/2020 Echo: No significant AI.   Chest pain    a. 10/2020 MV: EF 68%, no ischemia/scar.   Chronic respiratory failure (HCC)    DOE (dyspnea on exertion)    GERD (gastroesophageal reflux disease)    History of pneumonia    History of pulmonary embolism 2001   History of recurrent UTIs    History of stroke 2018   HTN (hypertension)    Hyperlipemia    Hypotension    a. On midodrine.   Mitral valve regurgitation    a. 09/2020 Echo: Mild MR.   Osteoporosis    PAF (paroxysmal atrial fibrillation) ([place])    a. CHA2DS2VASc = 7-->Eliquis; b. 12/2020 Amio started-->DCCV x 1; c. 01/2021 ED eval for AF-->DCCV x 1.   Tremor    Vitamin D deficiency    Past Surgical History:  Procedure Laterality Date   BALLOON DILATION N/A 11/28/2020   Procedure: BALLOON DILATION;  Surgeon: Rogene Houston, MD;  Location: [facility] ENDO SUITE;  Service: Endoscopy;  Laterality: N/A;   BLADDER SURGERY     x 3   CARDIOVERSION N/A 01/04/2021   Procedure: CARDIOVERSION;  Surgeon: Arnoldo Lenis, MD;  Location: [facility] ORS;  Service: Endoscopy;  Laterality: N/A;   CERVICAL SPINE SURGERY     CHOLECYSTECTOMY     COLONOSCOPY     COLONOSCOPY WITH ESOPHAGOGASTRODUODENOSCOPY (EGD)  ESOPHAGOGASTRODUODENOSCOPY (EGD) WITH PROPOFOL N/A 11/28/2020   Procedure: ESOPHAGOGASTRODUODENOSCOPY (EGD) WITH PROPOFOL;  Surgeon: Rogene Houston, MD;  Location: [facility] END[facility];  Service: Endoscopy;  Laterality: N/A;   LUMBAR SPINE SURGERY     REPLACEMENT TOTAL KNEE Left    RIGHT LUNG WEDGE REMOVAL     TONSILLECTOMY      Current Outpatient Medications  Medication Sig Dispense Refill   acetaminophen (TYLENOL) 650 MG CR tablet Take 1 tablet (650 mg total) by mouth every 8 (eight) hours as needed for pain. (Patient taking differently: Take 650 mg by mouth in the morning and at bedtime.) 12 tablet 0   apixaban (ELIQUIS) 5 MG TABS tablet  Take 5 mg by mouth 2 (two) times daily.     carboxymethylcellul-glycerin (REFRESH OPTIVE) 0.5-0.9 % ophthalmic solution Place 1 drop into both eyes in the morning and at bedtime.     Cholecalciferol (VITAMIN D3) 1.25 MG (50000 UT) CAPS Take 1 capsule by mouth once a week. Monday at lunch     cyanocobalamin (,VITAMIN B-12,) 1000 MCG/ML injection Inject 1 mL (1,000 mcg total) into the muscle every 30 (thirty) days. 12 mL 1   docusate sodium (COLACE) 100 MG capsule Take 200 mg by mouth 2 (two) times daily.     dofetilide (TIKOSYN) 250 MCG capsule Take 1 capsule (250 mcg total) by mouth 2 (two) times daily. 60 capsule 6   doxycycline (ADOXA) 100 MG tablet Take 100 mg by mouth 2 (two) times daily.     ferrous sulfate 325 (65 FE) MG tablet Take 1 tablet (325 mg total) by mouth daily with breakfast. 90 tablet 3   furosemide (LASIX) 20 MG tablet Take 1 tablet (20 mg total) by mouth daily. 30 tablet 6   gabapentin (NEURONTIN) 100 MG capsule Take 200 mg by mouth See admin instructions. Take 200 mg in the morning and '200mg'$  at lunch     HYDROcodone-acetaminophen (NORCO/VICODIN) 5-325 MG tablet Take 1 tablet by mouth 2 (two) times daily as needed for moderate pain. (Patient taking differently: Take 1 tablet by mouth every 6 (six) hours as needed for moderate pain.) 15 tablet 0   melatonin 3 MG TABS tablet Take 9 mg by mouth at bedtime.     midodrine (PROAMATINE) 5 MG tablet Take 1 tablet (5 mg total) by mouth 2 (two) times daily with a meal. 60 tablet 11   Multiple Vitamins-Minerals (THERATRUM COMPLETE PO) Take 1 tablet by mouth daily.     nystatin cream (MYCOSTATIN) Apply topically.     nystatin ointment (MYCOSTATIN) Apply topically.     omeprazole (PRILOSEC) 40 MG capsule Take 40 mg by mouth daily.     predniSONE (DELTASONE) 5 MG tablet Take 5 mg by mouth daily.     sertraline (ZOLOFT) 50 MG tablet Take 50 mg by mouth daily.     Vibegron (GEMTESA) 75 MG TABS Take 1 capsule by mouth daily. (Patient taking  differently: Take 1 capsule by mouth at bedtime.) 30 tablet 11   No current facility-administered medications for this encounter.    Allergies  Allergen Reactions   Coumadin [Warfarin] Hives   Atorvastatin Other (See Comments)    Muscle aches   Ciprofloxacin Other (See Comments)    unk   Macrobid [Nitrofurantoin]     Cause respiratory flare   Nsaids Other (See Comments)    unk   Other Other (See Comments)    Not specified. In chart from nursing facility.    Sulfa Antibiotics Itching and  Rash    Social History   Socioeconomic History   Marital status: Widowed    Spouse name: Not on file   Number of children: 3   Years of education: 67   Highest education level: High school graduate  Occupational History   Occupation: Retired  Tobacco Use   Smoking status: Never   Smokeless tobacco: Never   Tobacco comments:    Never smoke 02/03/22  Vaping Use   Vaping Use: Never used  Substance and Sexual Activity   Alcohol use: Not Currently   Drug use: Never   Sexual activity: Not on file  Other Topics Concern   Not on file  Social History Narrative   Lives with her daughter.   Right-handed.   Rare caffeine use.   Social Determinants of Health   Financial Resource Strain: Not on file  Food Insecurity: Not on file  Transportation Needs: Not on file  Physical Activity: Not on file  Stress: Not on file  Social Connections: Not on file  Intimate Partner Violence: Not on file     ROS- All systems are reviewed and negative except as per the HPI above.  Physical Exam: Vitals:   02/03/22 1555  BP: (!) 130/90  Pulse: 80  Weight: 79.8 kg  Height: '5\' 1"'$  (1.549 m)     GEN- The patient is a well appearing elderly female, alert and oriented x 3 today.   HEENT-head normocephalic, atraumatic, sclera clear, conjunctiva pink, hearing intact, trachea midline. Lungs- Clear to ausculation bilaterally, normal work of breathing, on O2 nasal canula  Heart- Regular rate and rhythm,  no murmurs, rubs or gallops  GI- soft, NT, ND, + BS Extremities- no clubbing, cyanosis, or edema MS- no significant deformity or atrophy Skin- no rash or lesion Psych- euthymic mood, full affect Neuro- strength and sensation are intact   Wt Readings from Last 3 Encounters:  02/03/22 79.8 kg  01/21/22 79.7 kg  12/24/21 79.3 kg    EKG today demonstrates  SR Vent. rate 80 BPM PR interval 150 ms QRS duration 70 ms QT/QTcB 374/431 ms  Echo 06/25/21 demonstrated   1. Left ventricular ejection fraction, by estimation, is 60 to 65%. The  left ventricle has normal function. The left ventricle has no regional  wall motion abnormalities. There is mild left ventricular hypertrophy.  Left ventricular diastolic parameters are consistent with Grade I diastolic dysfunction (impaired relaxation).   2. Right ventricular systolic function is normal. The right ventricular  size is normal. Tricuspid regurgitation signal is inadequate for assessing  PA pressure.   3. Left atrial size was mildly dilated.   4. The mitral valve is abnormal. Mild mitral valve regurgitation. No  evidence of mitral stenosis.   5. The aortic valve is tricuspid. There is moderate calcification of the  aortic valve. There is moderate thickening of the aortic valve. Aortic  valve regurgitation is mild to moderate. Mild to moderate aortic valve  stenosis. Aortic valve mean gradient  measures 13.5 mmHg. Aortic valve peak gradient measures 24.9 mmHg. Aortic valve area, by VTI measures 1.45 cm.   6. IVC is small, suggesting low RA pressure and hypovolemia.   Epic records are reviewed at length today  CHA2DS2-VASc Score = 5  The patient's score is based upon: CHF History: 0 HTN History: 0 Diabetes History: 0 Stroke History: 2 Vascular Disease History: 0 Age Score: 2 Gender Score: 1       ASSESSMENT AND PLAN: 1. Paroxysmal Atrial Fibrillation (ICD10:  I48.0)  The patient's CHA2DS2-VASc score is 5, indicating a 7.2%  annual risk of stroke.   S/p dofetilide admission 11/14-11/17/23 Patient appears to be maintaining SR. Continue dofetilide 250 mcg BID. QT stable with addition of sertraline.  Check bmet/mag today. Continue Eliquis 5 mg BID Not on AV nodal agents given hypotension requiring midodrine.   2. Secondary Hypercoagulable State (ICD10:  D68.69) The patient is at significant risk for stroke/thromboembolism based upon her CHA2DS2-VASc Score of 5.  Continue Apixaban (Eliquis).   3. ILD On O2 Would not rechallenge amiodarone. Followed by Dr Melvyn Novas.   Follow up with Dr Lovena Le in one month.    [facility] [address] [place], [place] [address] [phone] 02/03/2022 4:05 PM

## 2022-02-04 DIAGNOSIS — R2689 Other abnormalities of gait and mobility: Secondary | ICD-10-CM | POA: Diagnosis not present

## 2022-02-04 DIAGNOSIS — J84112 Idiopathic pulmonary fibrosis: Secondary | ICD-10-CM | POA: Diagnosis not present

## 2022-02-04 DIAGNOSIS — I480 Paroxysmal atrial fibrillation: Secondary | ICD-10-CM | POA: Diagnosis not present

## 2022-02-04 DIAGNOSIS — R531 Weakness: Secondary | ICD-10-CM | POA: Diagnosis not present

## 2022-02-04 DIAGNOSIS — M6281 Muscle weakness (generalized): Secondary | ICD-10-CM | POA: Diagnosis not present

## 2022-02-05 DIAGNOSIS — I480 Paroxysmal atrial fibrillation: Secondary | ICD-10-CM | POA: Diagnosis not present

## 2022-02-05 DIAGNOSIS — J9611 Chronic respiratory failure with hypoxia: Secondary | ICD-10-CM | POA: Diagnosis not present

## 2022-02-05 DIAGNOSIS — R531 Weakness: Secondary | ICD-10-CM | POA: Diagnosis not present

## 2022-02-05 DIAGNOSIS — I509 Heart failure, unspecified: Secondary | ICD-10-CM | POA: Diagnosis not present

## 2022-02-05 DIAGNOSIS — J449 Chronic obstructive pulmonary disease, unspecified: Secondary | ICD-10-CM | POA: Diagnosis not present

## 2022-02-05 DIAGNOSIS — R2689 Other abnormalities of gait and mobility: Secondary | ICD-10-CM | POA: Diagnosis not present

## 2022-02-05 DIAGNOSIS — J84112 Idiopathic pulmonary fibrosis: Secondary | ICD-10-CM | POA: Diagnosis not present

## 2022-02-05 DIAGNOSIS — M6281 Muscle weakness (generalized): Secondary | ICD-10-CM | POA: Diagnosis not present

## 2022-02-06 ENCOUNTER — Other Ambulatory Visit: Payer: Self-pay | Admitting: *Deleted

## 2022-02-06 NOTE — Patient Outreach (Signed)
Per [facility] Monique Robles resides in [facility]. Screening for potential [facility] care coordination services as benefit of insurance plan and PCP.   Update received from SNF social worker indicating Monique Robles is a long term resident.   No identifiable [facility] care coordination services.   Marthenia Rolling, MSN, RN,BSN [place] Acute Care Coordinator [phone] (Direct dial)

## 2022-02-07 DIAGNOSIS — J9611 Chronic respiratory failure with hypoxia: Secondary | ICD-10-CM | POA: Diagnosis not present

## 2022-02-07 DIAGNOSIS — I480 Paroxysmal atrial fibrillation: Secondary | ICD-10-CM | POA: Diagnosis not present

## 2022-02-07 DIAGNOSIS — R2689 Other abnormalities of gait and mobility: Secondary | ICD-10-CM | POA: Diagnosis not present

## 2022-02-07 DIAGNOSIS — J84112 Idiopathic pulmonary fibrosis: Secondary | ICD-10-CM | POA: Diagnosis not present

## 2022-02-07 DIAGNOSIS — R531 Weakness: Secondary | ICD-10-CM | POA: Diagnosis not present

## 2022-02-07 DIAGNOSIS — M6281 Muscle weakness (generalized): Secondary | ICD-10-CM | POA: Diagnosis not present

## 2022-02-09 DIAGNOSIS — M6281 Muscle weakness (generalized): Secondary | ICD-10-CM | POA: Diagnosis not present

## 2022-02-09 DIAGNOSIS — R531 Weakness: Secondary | ICD-10-CM | POA: Diagnosis not present

## 2022-02-09 DIAGNOSIS — I480 Paroxysmal atrial fibrillation: Secondary | ICD-10-CM | POA: Diagnosis not present

## 2022-02-09 DIAGNOSIS — J84112 Idiopathic pulmonary fibrosis: Secondary | ICD-10-CM | POA: Diagnosis not present

## 2022-02-09 DIAGNOSIS — R2689 Other abnormalities of gait and mobility: Secondary | ICD-10-CM | POA: Diagnosis not present

## 2022-02-10 DIAGNOSIS — I480 Paroxysmal atrial fibrillation: Secondary | ICD-10-CM | POA: Diagnosis not present

## 2022-02-10 DIAGNOSIS — R531 Weakness: Secondary | ICD-10-CM | POA: Diagnosis not present

## 2022-02-10 DIAGNOSIS — R2689 Other abnormalities of gait and mobility: Secondary | ICD-10-CM | POA: Diagnosis not present

## 2022-02-10 DIAGNOSIS — M6281 Muscle weakness (generalized): Secondary | ICD-10-CM | POA: Diagnosis not present

## 2022-02-10 DIAGNOSIS — J84112 Idiopathic pulmonary fibrosis: Secondary | ICD-10-CM | POA: Diagnosis not present

## 2022-02-11 DIAGNOSIS — R2689 Other abnormalities of gait and mobility: Secondary | ICD-10-CM | POA: Diagnosis not present

## 2022-02-11 DIAGNOSIS — M6281 Muscle weakness (generalized): Secondary | ICD-10-CM | POA: Diagnosis not present

## 2022-02-11 DIAGNOSIS — J84112 Idiopathic pulmonary fibrosis: Secondary | ICD-10-CM | POA: Diagnosis not present

## 2022-02-11 DIAGNOSIS — R531 Weakness: Secondary | ICD-10-CM | POA: Diagnosis not present

## 2022-02-11 DIAGNOSIS — I480 Paroxysmal atrial fibrillation: Secondary | ICD-10-CM | POA: Diagnosis not present

## 2022-02-12 DIAGNOSIS — J84112 Idiopathic pulmonary fibrosis: Secondary | ICD-10-CM | POA: Diagnosis not present

## 2022-02-12 DIAGNOSIS — R2689 Other abnormalities of gait and mobility: Secondary | ICD-10-CM | POA: Diagnosis not present

## 2022-02-12 DIAGNOSIS — M6281 Muscle weakness (generalized): Secondary | ICD-10-CM | POA: Diagnosis not present

## 2022-02-12 DIAGNOSIS — I480 Paroxysmal atrial fibrillation: Secondary | ICD-10-CM | POA: Diagnosis not present

## 2022-02-12 DIAGNOSIS — R531 Weakness: Secondary | ICD-10-CM | POA: Diagnosis not present

## 2022-02-13 DIAGNOSIS — R531 Weakness: Secondary | ICD-10-CM | POA: Diagnosis not present

## 2022-02-13 DIAGNOSIS — I480 Paroxysmal atrial fibrillation: Secondary | ICD-10-CM | POA: Diagnosis not present

## 2022-02-13 DIAGNOSIS — R2689 Other abnormalities of gait and mobility: Secondary | ICD-10-CM | POA: Diagnosis not present

## 2022-02-13 DIAGNOSIS — J84112 Idiopathic pulmonary fibrosis: Secondary | ICD-10-CM | POA: Diagnosis not present

## 2022-02-13 DIAGNOSIS — M6281 Muscle weakness (generalized): Secondary | ICD-10-CM | POA: Diagnosis not present

## 2022-02-14 DIAGNOSIS — M6281 Muscle weakness (generalized): Secondary | ICD-10-CM | POA: Diagnosis not present

## 2022-02-14 DIAGNOSIS — R2689 Other abnormalities of gait and mobility: Secondary | ICD-10-CM | POA: Diagnosis not present

## 2022-02-14 DIAGNOSIS — R531 Weakness: Secondary | ICD-10-CM | POA: Diagnosis not present

## 2022-02-14 DIAGNOSIS — J84112 Idiopathic pulmonary fibrosis: Secondary | ICD-10-CM | POA: Diagnosis not present

## 2022-02-14 DIAGNOSIS — I480 Paroxysmal atrial fibrillation: Secondary | ICD-10-CM | POA: Diagnosis not present

## 2022-02-17 DIAGNOSIS — M2042 Other hammer toe(s) (acquired), left foot: Secondary | ICD-10-CM | POA: Diagnosis not present

## 2022-02-17 DIAGNOSIS — B351 Tinea unguium: Secondary | ICD-10-CM | POA: Diagnosis not present

## 2022-02-17 DIAGNOSIS — I739 Peripheral vascular disease, unspecified: Secondary | ICD-10-CM | POA: Diagnosis not present

## 2022-02-17 DIAGNOSIS — M2041 Other hammer toe(s) (acquired), right foot: Secondary | ICD-10-CM | POA: Diagnosis not present

## 2022-02-18 DIAGNOSIS — M6281 Muscle weakness (generalized): Secondary | ICD-10-CM | POA: Diagnosis not present

## 2022-02-18 DIAGNOSIS — J84112 Idiopathic pulmonary fibrosis: Secondary | ICD-10-CM | POA: Diagnosis not present

## 2022-02-18 DIAGNOSIS — I480 Paroxysmal atrial fibrillation: Secondary | ICD-10-CM | POA: Diagnosis not present

## 2022-02-18 DIAGNOSIS — R531 Weakness: Secondary | ICD-10-CM | POA: Diagnosis not present

## 2022-02-18 DIAGNOSIS — R2689 Other abnormalities of gait and mobility: Secondary | ICD-10-CM | POA: Diagnosis not present

## 2022-02-19 DIAGNOSIS — R2689 Other abnormalities of gait and mobility: Secondary | ICD-10-CM | POA: Diagnosis not present

## 2022-02-19 DIAGNOSIS — M6281 Muscle weakness (generalized): Secondary | ICD-10-CM | POA: Diagnosis not present

## 2022-02-19 DIAGNOSIS — R531 Weakness: Secondary | ICD-10-CM | POA: Diagnosis not present

## 2022-02-19 DIAGNOSIS — J84112 Idiopathic pulmonary fibrosis: Secondary | ICD-10-CM | POA: Diagnosis not present

## 2022-02-19 DIAGNOSIS — I480 Paroxysmal atrial fibrillation: Secondary | ICD-10-CM | POA: Diagnosis not present

## 2022-02-20 DIAGNOSIS — M6281 Muscle weakness (generalized): Secondary | ICD-10-CM | POA: Diagnosis not present

## 2022-02-20 DIAGNOSIS — R531 Weakness: Secondary | ICD-10-CM | POA: Diagnosis not present

## 2022-02-20 DIAGNOSIS — J84112 Idiopathic pulmonary fibrosis: Secondary | ICD-10-CM | POA: Diagnosis not present

## 2022-02-20 DIAGNOSIS — R2689 Other abnormalities of gait and mobility: Secondary | ICD-10-CM | POA: Diagnosis not present

## 2022-02-20 DIAGNOSIS — I480 Paroxysmal atrial fibrillation: Secondary | ICD-10-CM | POA: Diagnosis not present

## 2022-02-25 DIAGNOSIS — R531 Weakness: Secondary | ICD-10-CM | POA: Diagnosis not present

## 2022-02-25 DIAGNOSIS — J84112 Idiopathic pulmonary fibrosis: Secondary | ICD-10-CM | POA: Diagnosis not present

## 2022-02-25 DIAGNOSIS — R2689 Other abnormalities of gait and mobility: Secondary | ICD-10-CM | POA: Diagnosis not present

## 2022-02-25 DIAGNOSIS — M6281 Muscle weakness (generalized): Secondary | ICD-10-CM | POA: Diagnosis not present

## 2022-02-25 DIAGNOSIS — I480 Paroxysmal atrial fibrillation: Secondary | ICD-10-CM | POA: Diagnosis not present

## 2022-02-26 DIAGNOSIS — I480 Paroxysmal atrial fibrillation: Secondary | ICD-10-CM | POA: Diagnosis not present

## 2022-02-26 DIAGNOSIS — R531 Weakness: Secondary | ICD-10-CM | POA: Diagnosis not present

## 2022-02-26 DIAGNOSIS — R2689 Other abnormalities of gait and mobility: Secondary | ICD-10-CM | POA: Diagnosis not present

## 2022-02-26 DIAGNOSIS — M6281 Muscle weakness (generalized): Secondary | ICD-10-CM | POA: Diagnosis not present

## 2022-02-26 DIAGNOSIS — J84112 Idiopathic pulmonary fibrosis: Secondary | ICD-10-CM | POA: Diagnosis not present

## 2022-02-27 DIAGNOSIS — R531 Weakness: Secondary | ICD-10-CM | POA: Diagnosis not present

## 2022-02-27 DIAGNOSIS — J84112 Idiopathic pulmonary fibrosis: Secondary | ICD-10-CM | POA: Diagnosis not present

## 2022-02-27 DIAGNOSIS — R2689 Other abnormalities of gait and mobility: Secondary | ICD-10-CM | POA: Diagnosis not present

## 2022-02-27 DIAGNOSIS — I480 Paroxysmal atrial fibrillation: Secondary | ICD-10-CM | POA: Diagnosis not present

## 2022-02-27 DIAGNOSIS — M6281 Muscle weakness (generalized): Secondary | ICD-10-CM | POA: Diagnosis not present

## 2022-03-04 DIAGNOSIS — I480 Paroxysmal atrial fibrillation: Secondary | ICD-10-CM | POA: Diagnosis not present

## 2022-03-04 DIAGNOSIS — M6281 Muscle weakness (generalized): Secondary | ICD-10-CM | POA: Diagnosis not present

## 2022-03-04 DIAGNOSIS — J84112 Idiopathic pulmonary fibrosis: Secondary | ICD-10-CM | POA: Diagnosis not present

## 2022-03-04 DIAGNOSIS — R2689 Other abnormalities of gait and mobility: Secondary | ICD-10-CM | POA: Diagnosis not present

## 2022-03-04 DIAGNOSIS — R531 Weakness: Secondary | ICD-10-CM | POA: Diagnosis not present

## 2022-03-05 DIAGNOSIS — I480 Paroxysmal atrial fibrillation: Secondary | ICD-10-CM | POA: Diagnosis not present

## 2022-03-05 DIAGNOSIS — R531 Weakness: Secondary | ICD-10-CM | POA: Diagnosis not present

## 2022-03-05 DIAGNOSIS — J84112 Idiopathic pulmonary fibrosis: Secondary | ICD-10-CM | POA: Diagnosis not present

## 2022-03-05 DIAGNOSIS — M6281 Muscle weakness (generalized): Secondary | ICD-10-CM | POA: Diagnosis not present

## 2022-03-05 DIAGNOSIS — R2689 Other abnormalities of gait and mobility: Secondary | ICD-10-CM | POA: Diagnosis not present

## 2022-03-06 DIAGNOSIS — R2689 Other abnormalities of gait and mobility: Secondary | ICD-10-CM | POA: Diagnosis not present

## 2022-03-06 DIAGNOSIS — J84112 Idiopathic pulmonary fibrosis: Secondary | ICD-10-CM | POA: Diagnosis not present

## 2022-03-06 DIAGNOSIS — I480 Paroxysmal atrial fibrillation: Secondary | ICD-10-CM | POA: Diagnosis not present

## 2022-03-06 DIAGNOSIS — M6281 Muscle weakness (generalized): Secondary | ICD-10-CM | POA: Diagnosis not present

## 2022-03-06 DIAGNOSIS — R531 Weakness: Secondary | ICD-10-CM | POA: Diagnosis not present

## 2022-03-07 DIAGNOSIS — R531 Weakness: Secondary | ICD-10-CM | POA: Diagnosis not present

## 2022-03-07 DIAGNOSIS — I480 Paroxysmal atrial fibrillation: Secondary | ICD-10-CM | POA: Diagnosis not present

## 2022-03-07 DIAGNOSIS — M6281 Muscle weakness (generalized): Secondary | ICD-10-CM | POA: Diagnosis not present

## 2022-03-07 DIAGNOSIS — J9611 Chronic respiratory failure with hypoxia: Secondary | ICD-10-CM | POA: Diagnosis not present

## 2022-03-07 DIAGNOSIS — J449 Chronic obstructive pulmonary disease, unspecified: Secondary | ICD-10-CM | POA: Diagnosis not present

## 2022-03-07 DIAGNOSIS — J84112 Idiopathic pulmonary fibrosis: Secondary | ICD-10-CM | POA: Diagnosis not present

## 2022-03-07 DIAGNOSIS — R2689 Other abnormalities of gait and mobility: Secondary | ICD-10-CM | POA: Diagnosis not present

## 2022-03-07 DIAGNOSIS — I509 Heart failure, unspecified: Secondary | ICD-10-CM | POA: Diagnosis not present

## 2022-03-08 DIAGNOSIS — M6281 Muscle weakness (generalized): Secondary | ICD-10-CM | POA: Diagnosis not present

## 2022-03-08 DIAGNOSIS — R531 Weakness: Secondary | ICD-10-CM | POA: Diagnosis not present

## 2022-03-08 DIAGNOSIS — I480 Paroxysmal atrial fibrillation: Secondary | ICD-10-CM | POA: Diagnosis not present

## 2022-03-08 DIAGNOSIS — J84112 Idiopathic pulmonary fibrosis: Secondary | ICD-10-CM | POA: Diagnosis not present

## 2022-03-08 DIAGNOSIS — R2689 Other abnormalities of gait and mobility: Secondary | ICD-10-CM | POA: Diagnosis not present

## 2022-03-10 DIAGNOSIS — J9611 Chronic respiratory failure with hypoxia: Secondary | ICD-10-CM | POA: Diagnosis not present

## 2022-03-11 DIAGNOSIS — R531 Weakness: Secondary | ICD-10-CM | POA: Diagnosis not present

## 2022-03-11 DIAGNOSIS — J84112 Idiopathic pulmonary fibrosis: Secondary | ICD-10-CM | POA: Diagnosis not present

## 2022-03-11 DIAGNOSIS — M6281 Muscle weakness (generalized): Secondary | ICD-10-CM | POA: Diagnosis not present

## 2022-03-11 DIAGNOSIS — I480 Paroxysmal atrial fibrillation: Secondary | ICD-10-CM | POA: Diagnosis not present

## 2022-03-11 DIAGNOSIS — R2689 Other abnormalities of gait and mobility: Secondary | ICD-10-CM | POA: Diagnosis not present

## 2022-03-12 DIAGNOSIS — I480 Paroxysmal atrial fibrillation: Secondary | ICD-10-CM | POA: Diagnosis not present

## 2022-03-12 DIAGNOSIS — J84112 Idiopathic pulmonary fibrosis: Secondary | ICD-10-CM | POA: Diagnosis not present

## 2022-03-12 DIAGNOSIS — M6281 Muscle weakness (generalized): Secondary | ICD-10-CM | POA: Diagnosis not present

## 2022-03-12 DIAGNOSIS — R2689 Other abnormalities of gait and mobility: Secondary | ICD-10-CM | POA: Diagnosis not present

## 2022-03-12 DIAGNOSIS — R531 Weakness: Secondary | ICD-10-CM | POA: Diagnosis not present

## 2022-03-13 DIAGNOSIS — M6281 Muscle weakness (generalized): Secondary | ICD-10-CM | POA: Diagnosis not present

## 2022-03-13 DIAGNOSIS — R531 Weakness: Secondary | ICD-10-CM | POA: Diagnosis not present

## 2022-03-13 DIAGNOSIS — I480 Paroxysmal atrial fibrillation: Secondary | ICD-10-CM | POA: Diagnosis not present

## 2022-03-13 DIAGNOSIS — R2689 Other abnormalities of gait and mobility: Secondary | ICD-10-CM | POA: Diagnosis not present

## 2022-03-13 DIAGNOSIS — J84112 Idiopathic pulmonary fibrosis: Secondary | ICD-10-CM | POA: Diagnosis not present

## 2022-03-14 DIAGNOSIS — J84112 Idiopathic pulmonary fibrosis: Secondary | ICD-10-CM | POA: Diagnosis not present

## 2022-03-14 DIAGNOSIS — I480 Paroxysmal atrial fibrillation: Secondary | ICD-10-CM | POA: Diagnosis not present

## 2022-03-14 DIAGNOSIS — M6281 Muscle weakness (generalized): Secondary | ICD-10-CM | POA: Diagnosis not present

## 2022-03-14 DIAGNOSIS — R2689 Other abnormalities of gait and mobility: Secondary | ICD-10-CM | POA: Diagnosis not present

## 2022-03-14 DIAGNOSIS — R531 Weakness: Secondary | ICD-10-CM | POA: Diagnosis not present

## 2022-03-17 DIAGNOSIS — R2689 Other abnormalities of gait and mobility: Secondary | ICD-10-CM | POA: Diagnosis not present

## 2022-03-17 DIAGNOSIS — M6281 Muscle weakness (generalized): Secondary | ICD-10-CM | POA: Diagnosis not present

## 2022-03-17 DIAGNOSIS — R531 Weakness: Secondary | ICD-10-CM | POA: Diagnosis not present

## 2022-03-17 DIAGNOSIS — I480 Paroxysmal atrial fibrillation: Secondary | ICD-10-CM | POA: Diagnosis not present

## 2022-03-17 DIAGNOSIS — J84112 Idiopathic pulmonary fibrosis: Secondary | ICD-10-CM | POA: Diagnosis not present

## 2022-03-18 DIAGNOSIS — I480 Paroxysmal atrial fibrillation: Secondary | ICD-10-CM | POA: Diagnosis not present

## 2022-03-18 DIAGNOSIS — R2689 Other abnormalities of gait and mobility: Secondary | ICD-10-CM | POA: Diagnosis not present

## 2022-03-18 DIAGNOSIS — R531 Weakness: Secondary | ICD-10-CM | POA: Diagnosis not present

## 2022-03-18 DIAGNOSIS — M6281 Muscle weakness (generalized): Secondary | ICD-10-CM | POA: Diagnosis not present

## 2022-03-18 DIAGNOSIS — J84112 Idiopathic pulmonary fibrosis: Secondary | ICD-10-CM | POA: Diagnosis not present

## 2022-03-19 DIAGNOSIS — M6281 Muscle weakness (generalized): Secondary | ICD-10-CM | POA: Diagnosis not present

## 2022-03-19 DIAGNOSIS — I480 Paroxysmal atrial fibrillation: Secondary | ICD-10-CM | POA: Diagnosis not present

## 2022-03-19 DIAGNOSIS — J84112 Idiopathic pulmonary fibrosis: Secondary | ICD-10-CM | POA: Diagnosis not present

## 2022-03-19 DIAGNOSIS — R2689 Other abnormalities of gait and mobility: Secondary | ICD-10-CM | POA: Diagnosis not present

## 2022-03-19 DIAGNOSIS — R531 Weakness: Secondary | ICD-10-CM | POA: Diagnosis not present

## 2022-03-20 DIAGNOSIS — R2689 Other abnormalities of gait and mobility: Secondary | ICD-10-CM | POA: Diagnosis not present

## 2022-03-20 DIAGNOSIS — R531 Weakness: Secondary | ICD-10-CM | POA: Diagnosis not present

## 2022-03-20 DIAGNOSIS — J84112 Idiopathic pulmonary fibrosis: Secondary | ICD-10-CM | POA: Diagnosis not present

## 2022-03-20 DIAGNOSIS — I480 Paroxysmal atrial fibrillation: Secondary | ICD-10-CM | POA: Diagnosis not present

## 2022-03-20 DIAGNOSIS — M6281 Muscle weakness (generalized): Secondary | ICD-10-CM | POA: Diagnosis not present

## 2022-03-21 DIAGNOSIS — G8194 Hemiplegia, unspecified affecting left nondominant side: Secondary | ICD-10-CM | POA: Diagnosis not present

## 2022-03-21 DIAGNOSIS — R531 Weakness: Secondary | ICD-10-CM | POA: Diagnosis not present

## 2022-03-21 DIAGNOSIS — J84112 Idiopathic pulmonary fibrosis: Secondary | ICD-10-CM | POA: Diagnosis not present

## 2022-03-21 DIAGNOSIS — I10 Essential (primary) hypertension: Secondary | ICD-10-CM | POA: Diagnosis not present

## 2022-03-21 DIAGNOSIS — R2689 Other abnormalities of gait and mobility: Secondary | ICD-10-CM | POA: Diagnosis not present

## 2022-03-21 DIAGNOSIS — I4821 Permanent atrial fibrillation: Secondary | ICD-10-CM | POA: Diagnosis not present

## 2022-03-21 DIAGNOSIS — I480 Paroxysmal atrial fibrillation: Secondary | ICD-10-CM | POA: Diagnosis not present

## 2022-03-21 DIAGNOSIS — M6281 Muscle weakness (generalized): Secondary | ICD-10-CM | POA: Diagnosis not present

## 2022-03-24 DIAGNOSIS — R2689 Other abnormalities of gait and mobility: Secondary | ICD-10-CM | POA: Diagnosis not present

## 2022-03-24 DIAGNOSIS — I480 Paroxysmal atrial fibrillation: Secondary | ICD-10-CM | POA: Diagnosis not present

## 2022-03-24 DIAGNOSIS — R531 Weakness: Secondary | ICD-10-CM | POA: Diagnosis not present

## 2022-03-24 DIAGNOSIS — M6281 Muscle weakness (generalized): Secondary | ICD-10-CM | POA: Diagnosis not present

## 2022-03-24 DIAGNOSIS — J84112 Idiopathic pulmonary fibrosis: Secondary | ICD-10-CM | POA: Diagnosis not present

## 2022-03-25 DIAGNOSIS — R2689 Other abnormalities of gait and mobility: Secondary | ICD-10-CM | POA: Diagnosis not present

## 2022-03-25 DIAGNOSIS — R531 Weakness: Secondary | ICD-10-CM | POA: Diagnosis not present

## 2022-03-25 DIAGNOSIS — J84112 Idiopathic pulmonary fibrosis: Secondary | ICD-10-CM | POA: Diagnosis not present

## 2022-03-25 DIAGNOSIS — M6281 Muscle weakness (generalized): Secondary | ICD-10-CM | POA: Diagnosis not present

## 2022-03-25 DIAGNOSIS — I480 Paroxysmal atrial fibrillation: Secondary | ICD-10-CM | POA: Diagnosis not present

## 2022-03-26 ENCOUNTER — Ambulatory Visit: Payer: Medicare Other | Attending: Internal Medicine | Admitting: Internal Medicine

## 2022-03-26 ENCOUNTER — Encounter: Payer: Self-pay | Admitting: Internal Medicine

## 2022-03-26 VITALS — BP 138/100 | HR 70 | Ht 60.0 in | Wt 180.0 lb

## 2022-03-26 DIAGNOSIS — I4819 Other persistent atrial fibrillation: Secondary | ICD-10-CM | POA: Diagnosis not present

## 2022-03-26 DIAGNOSIS — I480 Paroxysmal atrial fibrillation: Secondary | ICD-10-CM | POA: Diagnosis not present

## 2022-03-26 DIAGNOSIS — R2689 Other abnormalities of gait and mobility: Secondary | ICD-10-CM | POA: Diagnosis not present

## 2022-03-26 DIAGNOSIS — R531 Weakness: Secondary | ICD-10-CM | POA: Diagnosis not present

## 2022-03-26 DIAGNOSIS — M6281 Muscle weakness (generalized): Secondary | ICD-10-CM | POA: Diagnosis not present

## 2022-03-26 DIAGNOSIS — J84112 Idiopathic pulmonary fibrosis: Secondary | ICD-10-CM | POA: Diagnosis not present

## 2022-03-26 MED ORDER — SERTRALINE HCL 50 MG PO TABS: 50.0000 mg | ORAL_TABLET | Freq: Every day | ORAL | 3 refills | Status: AC

## 2022-03-26 NOTE — Patient Instructions (Signed)
Medication Instructions:   Decrease Zoloft to 50 mg Daily   Check BP once Daily   *If you need a refill on your cardiac medications before your next appointment, please call your pharmacy*   Lab Work: NONE   If you have labs (blood work) drawn today and your tests are completely normal, you will receive your results only by: [place] (if you have MyChart) OR A paper copy in the mail If you have any lab test that is abnormal or we need to change your treatment, we will call you to review the results.   Testing/Procedures: NONE    Follow-Up: At [facility], you and your health needs are our priority.  As part of our continuing mission to provide you with exceptional heart care, we have created designated Provider Care Teams.  These Care Teams include your primary Cardiologist (physician) and Advanced Practice Providers (APPs -  Physician Assistants and Nurse Practitioners) who all work together to provide you with the care you need, when you need it.  We recommend signing up for the patient portal called "MyChart".  Sign up information is provided on this After Visit Summary.  MyChart is used to connect with patients for Virtual Visits (Telemedicine).  Patients are able to view lab/test results, encounter notes, upcoming appointments, etc.  Non-urgent messages can be sent to your provider as well.   To learn more about what you can do with MyChart, go to [email or web address].    Your next appointment:   6 month(s)  Provider:   Cristopher Peru, MD    Other Instructions Thank you for choosing [facility]!

## 2022-03-26 NOTE — Progress Notes (Signed)
HPI Monique Robles returns today for followup of her atrial fib. She is a pleasant 82 yo woman with uncontrolled atrial fib and an RVR. She has had multiple hospitalizations. In the interim she notes weakness. She has not had any clear cut atrial fib since initiation of dofetilide. When her vitals are checked she appears to be in the 70's. She is in a wheel chair. It appears that her Multaq was stopped. She is in a SNF.  Her zoloft was just increased from 50 to 100 mg daily. Allergies  Allergen Reactions   Coumadin [Warfarin] Hives   Atorvastatin Other (See Comments)    Muscle aches   Ciprofloxacin Other (See Comments)    unk   Macrobid [Nitrofurantoin]     Cause respiratory flare   Nsaids Other (See Comments)    unk   Other Other (See Comments)    Not specified. In chart from nursing facility.    Sulfa Antibiotics Itching and Rash     Current Outpatient Medications  Medication Sig Dispense Refill   acetaminophen (TYLENOL) 650 MG CR tablet Take 1 tablet (650 mg total) by mouth every 8 (eight) hours as needed for pain. (Patient taking differently: Take 650 mg by mouth in the morning and at bedtime.) 12 tablet 0   apixaban (ELIQUIS) 5 MG TABS tablet Take 5 mg by mouth 2 (two) times daily.     carboxymethylcellul-glycerin (REFRESH OPTIVE) 0.5-0.9 % ophthalmic solution Place 1 drop into both eyes in the morning and at bedtime.     Cholecalciferol (VITAMIN D3) 1.25 MG (50000 UT) CAPS Take 1 capsule by mouth once a week. Monday at lunch     cyanocobalamin (,VITAMIN B-12,) 1000 MCG/ML injection Inject 1 mL (1,000 mcg total) into the muscle every 30 (thirty) days. 12 mL 1   dofetilide (TIKOSYN) 250 MCG capsule Take 1 capsule (250 mcg total) by mouth 2 (two) times daily. 60 capsule 6   ferrous sulfate 325 (65 FE) MG tablet Take 1 tablet (325 mg total) by mouth daily with breakfast. 90 tablet 3   furosemide (LASIX) 20 MG tablet Take 1 tablet (20 mg total) by mouth daily. 30 tablet 6    gabapentin (NEURONTIN) 100 MG capsule Take 200 mg by mouth See admin instructions. Take 200 mg in the morning and '200mg'$  at lunch     HYDROcodone-acetaminophen (NORCO/VICODIN) 5-325 MG tablet Take 1 tablet by mouth 2 (two) times daily as needed for moderate pain. (Patient taking differently: Take 1 tablet by mouth every 6 (six) hours as needed for moderate pain.) 15 tablet 0   melatonin 3 MG TABS tablet Take 9 mg by mouth at bedtime.     midodrine (PROAMATINE) 5 MG tablet Take 1 tablet (5 mg total) by mouth 2 (two) times daily with a meal. 60 tablet 11   Multiple Vitamins-Minerals (THERATRUM COMPLETE PO) Take 1 tablet by mouth daily.     omeprazole (PRILOSEC) 40 MG capsule Take 40 mg by mouth daily.     predniSONE (DELTASONE) 5 MG tablet Take 5 mg by mouth daily.     sertraline (ZOLOFT) 100 MG tablet Take 100 mg by mouth daily.     Vibegron (GEMTESA) 75 MG TABS Take 1 capsule by mouth daily. (Patient taking differently: Take 1 capsule by mouth at bedtime.) 30 tablet 11   docusate sodium (COLACE) 100 MG capsule Take 200 mg by mouth 2 (two) times daily. (Patient not taking: Reported on 03/26/2022)     doxycycline (  ADOXA) 100 MG tablet Take 100 mg by mouth 2 (two) times daily. (Patient not taking: Reported on 03/26/2022)     nystatin cream (MYCOSTATIN) Apply topically. (Patient not taking: Reported on 03/26/2022)     nystatin ointment (MYCOSTATIN) Apply topically. (Patient not taking: Reported on 03/26/2022)     No current facility-administered medications for this visit.     Past Medical History:  Diagnosis Date   (HFpEF) heart failure with preserved ejection fraction ([place])    a. 09/2020 Echo: EF 65-70%, no rwma, GrI DD, nl RV fxn, mildly dil LA. Mild MR. Mild AS; b. 12/2020 Echo: EF 55-60%. Nl RV fxn.   Aortic insufficiency    a. 09/2020 Echo: No significant AI.   Chest pain    a. 10/2020 MV: EF 68%, no ischemia/scar.   Chronic respiratory failure (HCC)    DOE (dyspnea on exertion)    GERD  (gastroesophageal reflux disease)    History of pneumonia    History of pulmonary embolism 2001   History of recurrent UTIs    History of stroke 2018   HTN (hypertension)    Hyperlipemia    Hypotension    a. On midodrine.   Mitral valve regurgitation    a. 09/2020 Echo: Mild MR.   Osteoporosis    PAF (paroxysmal atrial fibrillation) ([place])    a. CHA2DS2VASc = 7-->Eliquis; b. 12/2020 Amio started-->DCCV x 1; c. 01/2021 ED eval for AF-->DCCV x 1.   Tremor    Vitamin D deficiency     ROS:   All systems reviewed and negative except as noted in the HPI.   Past Surgical History:  Procedure Laterality Date   BALLOON DILATION N/A 11/28/2020   Procedure: BALLOON DILATION;  Surgeon: Rogene Houston, MD;  [facility];  Service: Endoscopy;  Laterality: N/A;   BLADDER SURGERY     x 3   CARDIOVERSION N/A 01/04/2021   Procedure: CARDIOVERSION;  Surgeon: Arnoldo Lenis, MD;  Location: [facility];  Service: Endoscopy;  Laterality: N/A;   CERVICAL SPINE SURGERY     CHOLECYSTECTOMY     COLONOSCOPY     COLONOSCOPY WITH ESOPHAGOGASTRODUODENOSCOPY (EGD)     ESOPHAGOGASTRODUODENOSCOPY (EGD) WITH PROPOFOL N/A 11/28/2020   Procedure: ESOPHAGOGASTRODUODENOSCOPY (EGD) WITH PROPOFOL;  Surgeon: Rogene Houston, MD;  [facility];  Service: Endoscopy;  Laterality: N/A;   LUMBAR SPINE SURGERY     REPLACEMENT TOTAL KNEE Left    RIGHT LUNG WEDGE REMOVAL     TONSILLECTOMY       Family History  Problem Relation Age of Onset   Alzheimer's disease Mother        died at 81   Cancer Mother    Tremor Mother    Pneumonia Father    Heart disease Father    Diabetes Father    Heart attack Father        died at 79     Social History   Socioeconomic History   Marital status: Widowed    Spouse name: Not on file   Number of children: 3   Years of education: 30   Highest education level: High school graduate  Occupational History   Occupation: Retired  Tobacco Use   Smoking  status: Never   Smokeless tobacco: Never   Tobacco comments:    Never smoke 02/03/22  Vaping Use   Vaping Use: Never used  Substance and Sexual Activity   Alcohol use: Not Currently   Drug use: Never  Sexual activity: Not on file  Other Topics Concern   Not on file  Social History Narrative   Lives with her daughter.   Right-handed.   Rare caffeine use.   Social Determinants of Health   Financial Resource Strain: Not on file  Food Insecurity: Not on file  Transportation Needs: Not on file  Physical Activity: Not on file  Stress: Not on file  Social Connections: Not on file  Intimate Partner Violence: Not on file     BP (!) 138/100   Pulse 70   Ht 5' (1.524 m)   Wt 180 lb (81.6 kg)   SpO2 97%   BMI 35.15 kg/m   Physical Exam:  Well appearing NAD HEENT: Unremarkable Neck:  No JVD, no thyromegally Lymphatics:  No adenopathy Back:  No CVA tenderness Lungs:  Clear HEART:  Regular rate rhythm, no murmurs, no rubs, no clicks Abd:  soft, positive bowel sounds, no organomegally, no rebound, no guarding Ext:  2 plus pulses, no edema, no cyanosis, no clubbing Skin:  No rashes no nodules Neuro:  CN II through XII intact, motor grossly intact  Assess/Plan:  Atrial fib - she has been maintaining NSR. She will continue dofetilide. Interstitial lung disease - appears stable at least by exam. She will follow with Dr. Melvyn Novas. Continue oxygen. Hypotension - she is improved Sob - better but still present. Insomnia - her zoloft was increased from 50 to 100 mg. I am concerned about the potential for QT prolongation and I asked her to reduce her dose of zoloft to 50 mg daily.   Carleene Overlie Sierrah Luevano,MD

## 2022-03-27 DIAGNOSIS — I480 Paroxysmal atrial fibrillation: Secondary | ICD-10-CM | POA: Diagnosis not present

## 2022-03-27 DIAGNOSIS — R531 Weakness: Secondary | ICD-10-CM | POA: Diagnosis not present

## 2022-03-27 DIAGNOSIS — R2689 Other abnormalities of gait and mobility: Secondary | ICD-10-CM | POA: Diagnosis not present

## 2022-03-27 DIAGNOSIS — J84112 Idiopathic pulmonary fibrosis: Secondary | ICD-10-CM | POA: Diagnosis not present

## 2022-03-27 DIAGNOSIS — M6281 Muscle weakness (generalized): Secondary | ICD-10-CM | POA: Diagnosis not present

## 2022-03-28 DIAGNOSIS — I480 Paroxysmal atrial fibrillation: Secondary | ICD-10-CM | POA: Diagnosis not present

## 2022-03-28 DIAGNOSIS — R2689 Other abnormalities of gait and mobility: Secondary | ICD-10-CM | POA: Diagnosis not present

## 2022-03-28 DIAGNOSIS — M6281 Muscle weakness (generalized): Secondary | ICD-10-CM | POA: Diagnosis not present

## 2022-03-28 DIAGNOSIS — J84112 Idiopathic pulmonary fibrosis: Secondary | ICD-10-CM | POA: Diagnosis not present

## 2022-03-28 DIAGNOSIS — R531 Weakness: Secondary | ICD-10-CM | POA: Diagnosis not present

## 2022-04-01 DIAGNOSIS — R531 Weakness: Secondary | ICD-10-CM | POA: Diagnosis not present

## 2022-04-01 DIAGNOSIS — I480 Paroxysmal atrial fibrillation: Secondary | ICD-10-CM | POA: Diagnosis not present

## 2022-04-01 DIAGNOSIS — J84112 Idiopathic pulmonary fibrosis: Secondary | ICD-10-CM | POA: Diagnosis not present

## 2022-04-01 DIAGNOSIS — M6281 Muscle weakness (generalized): Secondary | ICD-10-CM | POA: Diagnosis not present

## 2022-04-01 DIAGNOSIS — R2689 Other abnormalities of gait and mobility: Secondary | ICD-10-CM | POA: Diagnosis not present

## 2022-04-02 DIAGNOSIS — M6281 Muscle weakness (generalized): Secondary | ICD-10-CM | POA: Diagnosis not present

## 2022-04-02 DIAGNOSIS — J84112 Idiopathic pulmonary fibrosis: Secondary | ICD-10-CM | POA: Diagnosis not present

## 2022-04-02 DIAGNOSIS — I480 Paroxysmal atrial fibrillation: Secondary | ICD-10-CM | POA: Diagnosis not present

## 2022-04-02 DIAGNOSIS — R2689 Other abnormalities of gait and mobility: Secondary | ICD-10-CM | POA: Diagnosis not present

## 2022-04-02 DIAGNOSIS — R531 Weakness: Secondary | ICD-10-CM | POA: Diagnosis not present

## 2022-04-03 DIAGNOSIS — R531 Weakness: Secondary | ICD-10-CM | POA: Diagnosis not present

## 2022-04-03 DIAGNOSIS — R2689 Other abnormalities of gait and mobility: Secondary | ICD-10-CM | POA: Diagnosis not present

## 2022-04-03 DIAGNOSIS — M6281 Muscle weakness (generalized): Secondary | ICD-10-CM | POA: Diagnosis not present

## 2022-04-03 DIAGNOSIS — J84112 Idiopathic pulmonary fibrosis: Secondary | ICD-10-CM | POA: Diagnosis not present

## 2022-04-03 DIAGNOSIS — I480 Paroxysmal atrial fibrillation: Secondary | ICD-10-CM | POA: Diagnosis not present

## 2022-04-05 DIAGNOSIS — R531 Weakness: Secondary | ICD-10-CM | POA: Diagnosis not present

## 2022-04-05 DIAGNOSIS — R2689 Other abnormalities of gait and mobility: Secondary | ICD-10-CM | POA: Diagnosis not present

## 2022-04-05 DIAGNOSIS — I480 Paroxysmal atrial fibrillation: Secondary | ICD-10-CM | POA: Diagnosis not present

## 2022-04-05 DIAGNOSIS — J84112 Idiopathic pulmonary fibrosis: Secondary | ICD-10-CM | POA: Diagnosis not present

## 2022-04-05 DIAGNOSIS — M6281 Muscle weakness (generalized): Secondary | ICD-10-CM | POA: Diagnosis not present

## 2022-04-07 DIAGNOSIS — J9611 Chronic respiratory failure with hypoxia: Secondary | ICD-10-CM | POA: Diagnosis not present

## 2022-04-07 DIAGNOSIS — J449 Chronic obstructive pulmonary disease, unspecified: Secondary | ICD-10-CM | POA: Diagnosis not present

## 2022-04-07 DIAGNOSIS — I509 Heart failure, unspecified: Secondary | ICD-10-CM | POA: Diagnosis not present

## 2022-04-08 DIAGNOSIS — M6281 Muscle weakness (generalized): Secondary | ICD-10-CM | POA: Diagnosis not present

## 2022-04-08 DIAGNOSIS — R2689 Other abnormalities of gait and mobility: Secondary | ICD-10-CM | POA: Diagnosis not present

## 2022-04-08 DIAGNOSIS — J84112 Idiopathic pulmonary fibrosis: Secondary | ICD-10-CM | POA: Diagnosis not present

## 2022-04-08 DIAGNOSIS — I480 Paroxysmal atrial fibrillation: Secondary | ICD-10-CM | POA: Diagnosis not present

## 2022-04-08 DIAGNOSIS — R531 Weakness: Secondary | ICD-10-CM | POA: Diagnosis not present

## 2022-04-09 DIAGNOSIS — J84112 Idiopathic pulmonary fibrosis: Secondary | ICD-10-CM | POA: Diagnosis not present

## 2022-04-09 DIAGNOSIS — R531 Weakness: Secondary | ICD-10-CM | POA: Diagnosis not present

## 2022-04-09 DIAGNOSIS — I480 Paroxysmal atrial fibrillation: Secondary | ICD-10-CM | POA: Diagnosis not present

## 2022-04-09 DIAGNOSIS — R2689 Other abnormalities of gait and mobility: Secondary | ICD-10-CM | POA: Diagnosis not present

## 2022-04-09 DIAGNOSIS — M6281 Muscle weakness (generalized): Secondary | ICD-10-CM | POA: Diagnosis not present

## 2022-04-10 DIAGNOSIS — J84112 Idiopathic pulmonary fibrosis: Secondary | ICD-10-CM | POA: Diagnosis not present

## 2022-04-10 DIAGNOSIS — M6281 Muscle weakness (generalized): Secondary | ICD-10-CM | POA: Diagnosis not present

## 2022-04-10 DIAGNOSIS — R2689 Other abnormalities of gait and mobility: Secondary | ICD-10-CM | POA: Diagnosis not present

## 2022-04-10 DIAGNOSIS — I480 Paroxysmal atrial fibrillation: Secondary | ICD-10-CM | POA: Diagnosis not present

## 2022-04-10 DIAGNOSIS — R531 Weakness: Secondary | ICD-10-CM | POA: Diagnosis not present

## 2022-04-10 DIAGNOSIS — J9611 Chronic respiratory failure with hypoxia: Secondary | ICD-10-CM | POA: Diagnosis not present

## 2022-04-11 DIAGNOSIS — I480 Paroxysmal atrial fibrillation: Secondary | ICD-10-CM | POA: Diagnosis not present

## 2022-04-11 DIAGNOSIS — R531 Weakness: Secondary | ICD-10-CM | POA: Diagnosis not present

## 2022-04-11 DIAGNOSIS — R2689 Other abnormalities of gait and mobility: Secondary | ICD-10-CM | POA: Diagnosis not present

## 2022-04-11 DIAGNOSIS — M6281 Muscle weakness (generalized): Secondary | ICD-10-CM | POA: Diagnosis not present

## 2022-04-11 DIAGNOSIS — J84112 Idiopathic pulmonary fibrosis: Secondary | ICD-10-CM | POA: Diagnosis not present

## 2022-04-12 DIAGNOSIS — R2689 Other abnormalities of gait and mobility: Secondary | ICD-10-CM | POA: Diagnosis not present

## 2022-04-12 DIAGNOSIS — J84112 Idiopathic pulmonary fibrosis: Secondary | ICD-10-CM | POA: Diagnosis not present

## 2022-04-12 DIAGNOSIS — M6281 Muscle weakness (generalized): Secondary | ICD-10-CM | POA: Diagnosis not present

## 2022-04-12 DIAGNOSIS — I480 Paroxysmal atrial fibrillation: Secondary | ICD-10-CM | POA: Diagnosis not present

## 2022-04-12 DIAGNOSIS — R531 Weakness: Secondary | ICD-10-CM | POA: Diagnosis not present

## 2022-04-15 DIAGNOSIS — M6281 Muscle weakness (generalized): Secondary | ICD-10-CM | POA: Diagnosis not present

## 2022-04-15 DIAGNOSIS — J84112 Idiopathic pulmonary fibrosis: Secondary | ICD-10-CM | POA: Diagnosis not present

## 2022-04-15 DIAGNOSIS — R2689 Other abnormalities of gait and mobility: Secondary | ICD-10-CM | POA: Diagnosis not present

## 2022-04-15 DIAGNOSIS — R531 Weakness: Secondary | ICD-10-CM | POA: Diagnosis not present

## 2022-04-15 DIAGNOSIS — I480 Paroxysmal atrial fibrillation: Secondary | ICD-10-CM | POA: Diagnosis not present

## 2022-04-16 DIAGNOSIS — J84112 Idiopathic pulmonary fibrosis: Secondary | ICD-10-CM | POA: Diagnosis not present

## 2022-04-16 DIAGNOSIS — I480 Paroxysmal atrial fibrillation: Secondary | ICD-10-CM | POA: Diagnosis not present

## 2022-04-16 DIAGNOSIS — R531 Weakness: Secondary | ICD-10-CM | POA: Diagnosis not present

## 2022-04-16 DIAGNOSIS — R2689 Other abnormalities of gait and mobility: Secondary | ICD-10-CM | POA: Diagnosis not present

## 2022-04-16 DIAGNOSIS — M6281 Muscle weakness (generalized): Secondary | ICD-10-CM | POA: Diagnosis not present

## 2022-04-21 DIAGNOSIS — B351 Tinea unguium: Secondary | ICD-10-CM | POA: Diagnosis not present

## 2022-04-21 DIAGNOSIS — M2041 Other hammer toe(s) (acquired), right foot: Secondary | ICD-10-CM | POA: Diagnosis not present

## 2022-04-21 DIAGNOSIS — M2042 Other hammer toe(s) (acquired), left foot: Secondary | ICD-10-CM | POA: Diagnosis not present

## 2022-04-21 DIAGNOSIS — I739 Peripheral vascular disease, unspecified: Secondary | ICD-10-CM | POA: Diagnosis not present

## 2022-04-22 DIAGNOSIS — M6281 Muscle weakness (generalized): Secondary | ICD-10-CM | POA: Diagnosis not present

## 2022-04-22 DIAGNOSIS — J84112 Idiopathic pulmonary fibrosis: Secondary | ICD-10-CM | POA: Diagnosis not present

## 2022-04-22 DIAGNOSIS — R531 Weakness: Secondary | ICD-10-CM | POA: Diagnosis not present

## 2022-04-22 DIAGNOSIS — R2689 Other abnormalities of gait and mobility: Secondary | ICD-10-CM | POA: Diagnosis not present

## 2022-04-22 DIAGNOSIS — I480 Paroxysmal atrial fibrillation: Secondary | ICD-10-CM | POA: Diagnosis not present

## 2022-04-23 DIAGNOSIS — I480 Paroxysmal atrial fibrillation: Secondary | ICD-10-CM | POA: Diagnosis not present

## 2022-04-23 DIAGNOSIS — R531 Weakness: Secondary | ICD-10-CM | POA: Diagnosis not present

## 2022-04-23 DIAGNOSIS — M6281 Muscle weakness (generalized): Secondary | ICD-10-CM | POA: Diagnosis not present

## 2022-04-23 DIAGNOSIS — J84112 Idiopathic pulmonary fibrosis: Secondary | ICD-10-CM | POA: Diagnosis not present

## 2022-04-23 DIAGNOSIS — R2689 Other abnormalities of gait and mobility: Secondary | ICD-10-CM | POA: Diagnosis not present

## 2022-04-24 DIAGNOSIS — J84112 Idiopathic pulmonary fibrosis: Secondary | ICD-10-CM | POA: Diagnosis not present

## 2022-04-24 DIAGNOSIS — R2689 Other abnormalities of gait and mobility: Secondary | ICD-10-CM | POA: Diagnosis not present

## 2022-04-24 DIAGNOSIS — R531 Weakness: Secondary | ICD-10-CM | POA: Diagnosis not present

## 2022-04-24 DIAGNOSIS — M6281 Muscle weakness (generalized): Secondary | ICD-10-CM | POA: Diagnosis not present

## 2022-04-24 DIAGNOSIS — I480 Paroxysmal atrial fibrillation: Secondary | ICD-10-CM | POA: Diagnosis not present

## 2022-04-25 DIAGNOSIS — M6281 Muscle weakness (generalized): Secondary | ICD-10-CM | POA: Diagnosis not present

## 2022-04-25 DIAGNOSIS — I480 Paroxysmal atrial fibrillation: Secondary | ICD-10-CM | POA: Diagnosis not present

## 2022-04-25 DIAGNOSIS — R531 Weakness: Secondary | ICD-10-CM | POA: Diagnosis not present

## 2022-04-25 DIAGNOSIS — J84112 Idiopathic pulmonary fibrosis: Secondary | ICD-10-CM | POA: Diagnosis not present

## 2022-04-25 DIAGNOSIS — R2689 Other abnormalities of gait and mobility: Secondary | ICD-10-CM | POA: Diagnosis not present

## 2022-04-28 DIAGNOSIS — I480 Paroxysmal atrial fibrillation: Secondary | ICD-10-CM | POA: Diagnosis not present

## 2022-04-28 DIAGNOSIS — J84112 Idiopathic pulmonary fibrosis: Secondary | ICD-10-CM | POA: Diagnosis not present

## 2022-04-28 DIAGNOSIS — R531 Weakness: Secondary | ICD-10-CM | POA: Diagnosis not present

## 2022-04-28 DIAGNOSIS — R2689 Other abnormalities of gait and mobility: Secondary | ICD-10-CM | POA: Diagnosis not present

## 2022-04-28 DIAGNOSIS — M6281 Muscle weakness (generalized): Secondary | ICD-10-CM | POA: Diagnosis not present

## 2022-04-29 DIAGNOSIS — R2689 Other abnormalities of gait and mobility: Secondary | ICD-10-CM | POA: Diagnosis not present

## 2022-04-29 DIAGNOSIS — M6281 Muscle weakness (generalized): Secondary | ICD-10-CM | POA: Diagnosis not present

## 2022-04-29 DIAGNOSIS — I480 Paroxysmal atrial fibrillation: Secondary | ICD-10-CM | POA: Diagnosis not present

## 2022-04-29 DIAGNOSIS — R531 Weakness: Secondary | ICD-10-CM | POA: Diagnosis not present

## 2022-04-29 DIAGNOSIS — J84112 Idiopathic pulmonary fibrosis: Secondary | ICD-10-CM | POA: Diagnosis not present

## 2022-05-16 DIAGNOSIS — Z23 Encounter for immunization: Secondary | ICD-10-CM | POA: Diagnosis not present

## 2022-05-17 DIAGNOSIS — I10 Essential (primary) hypertension: Secondary | ICD-10-CM | POA: Diagnosis not present

## 2022-05-17 DIAGNOSIS — J8410 Pulmonary fibrosis, unspecified: Secondary | ICD-10-CM | POA: Diagnosis not present

## 2022-05-17 DIAGNOSIS — I4821 Permanent atrial fibrillation: Secondary | ICD-10-CM | POA: Diagnosis not present

## 2022-05-17 DIAGNOSIS — G8194 Hemiplegia, unspecified affecting left nondominant side: Secondary | ICD-10-CM | POA: Diagnosis not present

## 2022-05-17 DIAGNOSIS — I5032 Chronic diastolic (congestive) heart failure: Secondary | ICD-10-CM | POA: Diagnosis not present

## 2022-06-23 DIAGNOSIS — H5213 Myopia, bilateral: Secondary | ICD-10-CM | POA: Diagnosis not present

## 2022-07-18 DIAGNOSIS — G8194 Hemiplegia, unspecified affecting left nondominant side: Secondary | ICD-10-CM | POA: Diagnosis not present

## 2022-07-18 DIAGNOSIS — I5032 Chronic diastolic (congestive) heart failure: Secondary | ICD-10-CM | POA: Diagnosis not present

## 2022-07-18 DIAGNOSIS — I10 Essential (primary) hypertension: Secondary | ICD-10-CM | POA: Diagnosis not present

## 2022-07-18 DIAGNOSIS — I4821 Permanent atrial fibrillation: Secondary | ICD-10-CM | POA: Diagnosis not present

## 2022-07-23 DIAGNOSIS — L603 Nail dystrophy: Secondary | ICD-10-CM | POA: Diagnosis not present

## 2022-07-23 DIAGNOSIS — I739 Peripheral vascular disease, unspecified: Secondary | ICD-10-CM | POA: Diagnosis not present

## 2022-07-25 DIAGNOSIS — H524 Presbyopia: Secondary | ICD-10-CM | POA: Diagnosis not present

## 2022-07-25 DIAGNOSIS — H52221 Regular astigmatism, right eye: Secondary | ICD-10-CM | POA: Diagnosis not present

## 2022-09-12 DIAGNOSIS — G8194 Hemiplegia, unspecified affecting left nondominant side: Secondary | ICD-10-CM | POA: Diagnosis not present

## 2022-09-12 DIAGNOSIS — Z9981 Dependence on supplemental oxygen: Secondary | ICD-10-CM | POA: Diagnosis not present

## 2022-09-12 DIAGNOSIS — I10 Essential (primary) hypertension: Secondary | ICD-10-CM | POA: Diagnosis not present

## 2022-09-12 DIAGNOSIS — J9611 Chronic respiratory failure with hypoxia: Secondary | ICD-10-CM | POA: Diagnosis not present

## 2022-09-12 DIAGNOSIS — J8410 Pulmonary fibrosis, unspecified: Secondary | ICD-10-CM | POA: Diagnosis not present

## 2022-09-12 DIAGNOSIS — I4821 Permanent atrial fibrillation: Secondary | ICD-10-CM | POA: Diagnosis not present

## 2022-09-23 DIAGNOSIS — J84112 Idiopathic pulmonary fibrosis: Secondary | ICD-10-CM | POA: Diagnosis not present

## 2022-09-23 DIAGNOSIS — M62521 Muscle wasting and atrophy, not elsewhere classified, right upper arm: Secondary | ICD-10-CM | POA: Diagnosis not present

## 2022-09-23 DIAGNOSIS — M62561 Muscle wasting and atrophy, not elsewhere classified, right lower leg: Secondary | ICD-10-CM | POA: Diagnosis not present

## 2022-09-23 DIAGNOSIS — M62522 Muscle wasting and atrophy, not elsewhere classified, left upper arm: Secondary | ICD-10-CM | POA: Diagnosis not present

## 2022-09-23 DIAGNOSIS — R2689 Other abnormalities of gait and mobility: Secondary | ICD-10-CM | POA: Diagnosis not present

## 2022-09-23 DIAGNOSIS — M62562 Muscle wasting and atrophy, not elsewhere classified, left lower leg: Secondary | ICD-10-CM | POA: Diagnosis not present

## 2022-09-24 DIAGNOSIS — M62521 Muscle wasting and atrophy, not elsewhere classified, right upper arm: Secondary | ICD-10-CM | POA: Diagnosis not present

## 2022-09-24 DIAGNOSIS — J84112 Idiopathic pulmonary fibrosis: Secondary | ICD-10-CM | POA: Diagnosis not present

## 2022-09-24 DIAGNOSIS — R2689 Other abnormalities of gait and mobility: Secondary | ICD-10-CM | POA: Diagnosis not present

## 2022-09-24 DIAGNOSIS — M62562 Muscle wasting and atrophy, not elsewhere classified, left lower leg: Secondary | ICD-10-CM | POA: Diagnosis not present

## 2022-09-24 DIAGNOSIS — M62522 Muscle wasting and atrophy, not elsewhere classified, left upper arm: Secondary | ICD-10-CM | POA: Diagnosis not present

## 2022-09-24 DIAGNOSIS — M62561 Muscle wasting and atrophy, not elsewhere classified, right lower leg: Secondary | ICD-10-CM | POA: Diagnosis not present

## 2022-09-25 DIAGNOSIS — M62562 Muscle wasting and atrophy, not elsewhere classified, left lower leg: Secondary | ICD-10-CM | POA: Diagnosis not present

## 2022-09-25 DIAGNOSIS — R2689 Other abnormalities of gait and mobility: Secondary | ICD-10-CM | POA: Diagnosis not present

## 2022-09-25 DIAGNOSIS — J84112 Idiopathic pulmonary fibrosis: Secondary | ICD-10-CM | POA: Diagnosis not present

## 2022-09-25 DIAGNOSIS — M62522 Muscle wasting and atrophy, not elsewhere classified, left upper arm: Secondary | ICD-10-CM | POA: Diagnosis not present

## 2022-09-25 DIAGNOSIS — M62561 Muscle wasting and atrophy, not elsewhere classified, right lower leg: Secondary | ICD-10-CM | POA: Diagnosis not present

## 2022-09-25 DIAGNOSIS — M62521 Muscle wasting and atrophy, not elsewhere classified, right upper arm: Secondary | ICD-10-CM | POA: Diagnosis not present

## 2022-09-26 DIAGNOSIS — R2689 Other abnormalities of gait and mobility: Secondary | ICD-10-CM | POA: Diagnosis not present

## 2022-09-26 DIAGNOSIS — M62562 Muscle wasting and atrophy, not elsewhere classified, left lower leg: Secondary | ICD-10-CM | POA: Diagnosis not present

## 2022-09-26 DIAGNOSIS — M62561 Muscle wasting and atrophy, not elsewhere classified, right lower leg: Secondary | ICD-10-CM | POA: Diagnosis not present

## 2022-09-26 DIAGNOSIS — J84112 Idiopathic pulmonary fibrosis: Secondary | ICD-10-CM | POA: Diagnosis not present

## 2022-09-26 DIAGNOSIS — M62522 Muscle wasting and atrophy, not elsewhere classified, left upper arm: Secondary | ICD-10-CM | POA: Diagnosis not present

## 2022-09-26 DIAGNOSIS — M62521 Muscle wasting and atrophy, not elsewhere classified, right upper arm: Secondary | ICD-10-CM | POA: Diagnosis not present

## 2022-09-27 DIAGNOSIS — J84112 Idiopathic pulmonary fibrosis: Secondary | ICD-10-CM | POA: Diagnosis not present

## 2022-09-27 DIAGNOSIS — M62561 Muscle wasting and atrophy, not elsewhere classified, right lower leg: Secondary | ICD-10-CM | POA: Diagnosis not present

## 2022-09-27 DIAGNOSIS — M62521 Muscle wasting and atrophy, not elsewhere classified, right upper arm: Secondary | ICD-10-CM | POA: Diagnosis not present

## 2022-09-27 DIAGNOSIS — M62522 Muscle wasting and atrophy, not elsewhere classified, left upper arm: Secondary | ICD-10-CM | POA: Diagnosis not present

## 2022-09-27 DIAGNOSIS — R2689 Other abnormalities of gait and mobility: Secondary | ICD-10-CM | POA: Diagnosis not present

## 2022-09-27 DIAGNOSIS — M62562 Muscle wasting and atrophy, not elsewhere classified, left lower leg: Secondary | ICD-10-CM | POA: Diagnosis not present

## 2022-09-30 DIAGNOSIS — J84112 Idiopathic pulmonary fibrosis: Secondary | ICD-10-CM | POA: Diagnosis not present

## 2022-09-30 DIAGNOSIS — M62522 Muscle wasting and atrophy, not elsewhere classified, left upper arm: Secondary | ICD-10-CM | POA: Diagnosis not present

## 2022-09-30 DIAGNOSIS — R2689 Other abnormalities of gait and mobility: Secondary | ICD-10-CM | POA: Diagnosis not present

## 2022-09-30 DIAGNOSIS — M62562 Muscle wasting and atrophy, not elsewhere classified, left lower leg: Secondary | ICD-10-CM | POA: Diagnosis not present

## 2022-09-30 DIAGNOSIS — M62561 Muscle wasting and atrophy, not elsewhere classified, right lower leg: Secondary | ICD-10-CM | POA: Diagnosis not present

## 2022-09-30 DIAGNOSIS — M62521 Muscle wasting and atrophy, not elsewhere classified, right upper arm: Secondary | ICD-10-CM | POA: Diagnosis not present

## 2022-10-01 DIAGNOSIS — M62562 Muscle wasting and atrophy, not elsewhere classified, left lower leg: Secondary | ICD-10-CM | POA: Diagnosis not present

## 2022-10-01 DIAGNOSIS — M62521 Muscle wasting and atrophy, not elsewhere classified, right upper arm: Secondary | ICD-10-CM | POA: Diagnosis not present

## 2022-10-01 DIAGNOSIS — R2689 Other abnormalities of gait and mobility: Secondary | ICD-10-CM | POA: Diagnosis not present

## 2022-10-01 DIAGNOSIS — J84112 Idiopathic pulmonary fibrosis: Secondary | ICD-10-CM | POA: Diagnosis not present

## 2022-10-01 DIAGNOSIS — M62561 Muscle wasting and atrophy, not elsewhere classified, right lower leg: Secondary | ICD-10-CM | POA: Diagnosis not present

## 2022-10-01 DIAGNOSIS — M62522 Muscle wasting and atrophy, not elsewhere classified, left upper arm: Secondary | ICD-10-CM | POA: Diagnosis not present

## 2022-10-02 DIAGNOSIS — R2689 Other abnormalities of gait and mobility: Secondary | ICD-10-CM | POA: Diagnosis not present

## 2022-10-02 DIAGNOSIS — M62522 Muscle wasting and atrophy, not elsewhere classified, left upper arm: Secondary | ICD-10-CM | POA: Diagnosis not present

## 2022-10-02 DIAGNOSIS — M62562 Muscle wasting and atrophy, not elsewhere classified, left lower leg: Secondary | ICD-10-CM | POA: Diagnosis not present

## 2022-10-02 DIAGNOSIS — M62561 Muscle wasting and atrophy, not elsewhere classified, right lower leg: Secondary | ICD-10-CM | POA: Diagnosis not present

## 2022-10-02 DIAGNOSIS — M62521 Muscle wasting and atrophy, not elsewhere classified, right upper arm: Secondary | ICD-10-CM | POA: Diagnosis not present

## 2022-10-02 DIAGNOSIS — J84112 Idiopathic pulmonary fibrosis: Secondary | ICD-10-CM | POA: Diagnosis not present

## 2022-10-03 DIAGNOSIS — M62522 Muscle wasting and atrophy, not elsewhere classified, left upper arm: Secondary | ICD-10-CM | POA: Diagnosis not present

## 2022-10-03 DIAGNOSIS — M62562 Muscle wasting and atrophy, not elsewhere classified, left lower leg: Secondary | ICD-10-CM | POA: Diagnosis not present

## 2022-10-03 DIAGNOSIS — M62561 Muscle wasting and atrophy, not elsewhere classified, right lower leg: Secondary | ICD-10-CM | POA: Diagnosis not present

## 2022-10-03 DIAGNOSIS — M62521 Muscle wasting and atrophy, not elsewhere classified, right upper arm: Secondary | ICD-10-CM | POA: Diagnosis not present

## 2022-10-03 DIAGNOSIS — R2689 Other abnormalities of gait and mobility: Secondary | ICD-10-CM | POA: Diagnosis not present

## 2022-10-03 DIAGNOSIS — J84112 Idiopathic pulmonary fibrosis: Secondary | ICD-10-CM | POA: Diagnosis not present

## 2022-10-04 DIAGNOSIS — M62522 Muscle wasting and atrophy, not elsewhere classified, left upper arm: Secondary | ICD-10-CM | POA: Diagnosis not present

## 2022-10-04 DIAGNOSIS — M62521 Muscle wasting and atrophy, not elsewhere classified, right upper arm: Secondary | ICD-10-CM | POA: Diagnosis not present

## 2022-10-04 DIAGNOSIS — R2689 Other abnormalities of gait and mobility: Secondary | ICD-10-CM | POA: Diagnosis not present

## 2022-10-04 DIAGNOSIS — M62561 Muscle wasting and atrophy, not elsewhere classified, right lower leg: Secondary | ICD-10-CM | POA: Diagnosis not present

## 2022-10-04 DIAGNOSIS — M62562 Muscle wasting and atrophy, not elsewhere classified, left lower leg: Secondary | ICD-10-CM | POA: Diagnosis not present

## 2022-10-04 DIAGNOSIS — J84112 Idiopathic pulmonary fibrosis: Secondary | ICD-10-CM | POA: Diagnosis not present

## 2022-10-06 DIAGNOSIS — M62561 Muscle wasting and atrophy, not elsewhere classified, right lower leg: Secondary | ICD-10-CM | POA: Diagnosis not present

## 2022-10-06 DIAGNOSIS — M62522 Muscle wasting and atrophy, not elsewhere classified, left upper arm: Secondary | ICD-10-CM | POA: Diagnosis not present

## 2022-10-06 DIAGNOSIS — R2689 Other abnormalities of gait and mobility: Secondary | ICD-10-CM | POA: Diagnosis not present

## 2022-10-06 DIAGNOSIS — M62562 Muscle wasting and atrophy, not elsewhere classified, left lower leg: Secondary | ICD-10-CM | POA: Diagnosis not present

## 2022-10-06 DIAGNOSIS — M62521 Muscle wasting and atrophy, not elsewhere classified, right upper arm: Secondary | ICD-10-CM | POA: Diagnosis not present

## 2022-10-06 DIAGNOSIS — J84112 Idiopathic pulmonary fibrosis: Secondary | ICD-10-CM | POA: Diagnosis not present

## 2022-10-07 DIAGNOSIS — M62522 Muscle wasting and atrophy, not elsewhere classified, left upper arm: Secondary | ICD-10-CM | POA: Diagnosis not present

## 2022-10-07 DIAGNOSIS — R2689 Other abnormalities of gait and mobility: Secondary | ICD-10-CM | POA: Diagnosis not present

## 2022-10-07 DIAGNOSIS — M62521 Muscle wasting and atrophy, not elsewhere classified, right upper arm: Secondary | ICD-10-CM | POA: Diagnosis not present

## 2022-10-07 DIAGNOSIS — J84112 Idiopathic pulmonary fibrosis: Secondary | ICD-10-CM | POA: Diagnosis not present

## 2022-10-07 DIAGNOSIS — M62561 Muscle wasting and atrophy, not elsewhere classified, right lower leg: Secondary | ICD-10-CM | POA: Diagnosis not present

## 2022-10-07 DIAGNOSIS — M62562 Muscle wasting and atrophy, not elsewhere classified, left lower leg: Secondary | ICD-10-CM | POA: Diagnosis not present

## 2022-10-08 DIAGNOSIS — M62561 Muscle wasting and atrophy, not elsewhere classified, right lower leg: Secondary | ICD-10-CM | POA: Diagnosis not present

## 2022-10-08 DIAGNOSIS — M62521 Muscle wasting and atrophy, not elsewhere classified, right upper arm: Secondary | ICD-10-CM | POA: Diagnosis not present

## 2022-10-08 DIAGNOSIS — J84112 Idiopathic pulmonary fibrosis: Secondary | ICD-10-CM | POA: Diagnosis not present

## 2022-10-08 DIAGNOSIS — M62562 Muscle wasting and atrophy, not elsewhere classified, left lower leg: Secondary | ICD-10-CM | POA: Diagnosis not present

## 2022-10-08 DIAGNOSIS — R2689 Other abnormalities of gait and mobility: Secondary | ICD-10-CM | POA: Diagnosis not present

## 2022-10-08 DIAGNOSIS — M62522 Muscle wasting and atrophy, not elsewhere classified, left upper arm: Secondary | ICD-10-CM | POA: Diagnosis not present

## 2022-10-09 ENCOUNTER — Ambulatory Visit: Payer: Medicare Other | Attending: Internal Medicine | Admitting: Internal Medicine

## 2022-10-09 VITALS — BP 122/78 | HR 66 | Ht 61.0 in | Wt 194.2 lb

## 2022-10-09 DIAGNOSIS — M62562 Muscle wasting and atrophy, not elsewhere classified, left lower leg: Secondary | ICD-10-CM | POA: Diagnosis not present

## 2022-10-09 DIAGNOSIS — M62522 Muscle wasting and atrophy, not elsewhere classified, left upper arm: Secondary | ICD-10-CM | POA: Diagnosis not present

## 2022-10-09 DIAGNOSIS — I509 Heart failure, unspecified: Secondary | ICD-10-CM | POA: Diagnosis not present

## 2022-10-09 DIAGNOSIS — I482 Chronic atrial fibrillation, unspecified: Secondary | ICD-10-CM

## 2022-10-09 DIAGNOSIS — M62561 Muscle wasting and atrophy, not elsewhere classified, right lower leg: Secondary | ICD-10-CM | POA: Diagnosis not present

## 2022-10-09 DIAGNOSIS — M62521 Muscle wasting and atrophy, not elsewhere classified, right upper arm: Secondary | ICD-10-CM | POA: Diagnosis not present

## 2022-10-09 DIAGNOSIS — R2689 Other abnormalities of gait and mobility: Secondary | ICD-10-CM | POA: Diagnosis not present

## 2022-10-09 NOTE — Progress Notes (Signed)
HPI Monique Robles returns today for followup of her atrial fib. She is a pleasant 82 yo woman with uncontrolled atrial fib and an RVR. She has had multiple hospitalizations. In the interim she notes weakness. She has not had any clear cut atrial fib since initiation of dofetilide. When her vitals are checked she appears to be in the 70's. She is in a wheel chair. It appears that her Multaq was stopped. She is in a SNF.  Her zoloft remains at 50 mg daily. Allergies  Allergen Reactions   Coumadin [Warfarin] Hives   Atorvastatin Other (See Comments)    Muscle aches   Ciprofloxacin Other (See Comments)    unk   Macrobid [Nitrofurantoin]     Cause respiratory flare   Nsaids Other (See Comments)    unk   Other Other (See Comments)    Not specified. In chart from nursing facility.    Sulfa Antibiotics Itching and Rash     Current Outpatient Medications  Medication Sig Dispense Refill   acetaminophen (TYLENOL) 650 MG CR tablet Take 1 tablet (650 mg total) by mouth every 8 (eight) hours as needed for pain. (Patient taking differently: Take 650 mg by mouth in the morning and at bedtime.) 12 tablet 0   apixaban (ELIQUIS) 5 MG TABS tablet Take 5 mg by mouth 2 (two) times daily.     carboxymethylcellul-glycerin (REFRESH OPTIVE) 0.5-0.9 % ophthalmic solution Place 1 drop into both eyes in the morning and at bedtime.     Cholecalciferol (VITAMIN D3) 1.25 MG (50000 UT) CAPS Take 1 capsule by mouth once a week. Monday at lunch     cyanocobalamin (,VITAMIN B-12,) 1000 MCG/ML injection Inject 1 mL (1,000 mcg total) into the muscle every 30 (thirty) days. 12 mL 1   dofetilide (TIKOSYN) 250 MCG capsule Take 1 capsule (250 mcg total) by mouth 2 (two) times daily. 60 capsule 6   ferrous sulfate 325 (65 FE) MG tablet Take 1 tablet (325 mg total) by mouth daily with breakfast. 90 tablet 3   furosemide (LASIX) 20 MG tablet Take 1 tablet (20 mg total) by mouth daily. 30 tablet 6   gabapentin (NEURONTIN)  100 MG capsule Take 200 mg by mouth See admin instructions. Take 200 mg in the morning and 200mg  at lunch     melatonin 3 MG TABS tablet Take 9 mg by mouth at bedtime.     midodrine (PROAMATINE) 5 MG tablet Take 1 tablet (5 mg total) by mouth 2 (two) times daily with a meal. 60 tablet 11   Multiple Vitamins-Minerals (THERATRUM COMPLETE PO) Take 1 tablet by mouth daily.     omeprazole (PRILOSEC) 40 MG capsule Take 40 mg by mouth daily.     predniSONE (DELTASONE) 5 MG tablet Take 5 mg by mouth daily.     sertraline (ZOLOFT) 50 MG tablet Take 1 tablet (50 mg total) by mouth daily. 90 tablet 3   Vibegron (GEMTESA) 75 MG TABS Take 1 capsule by mouth daily. (Patient taking differently: Take 1 capsule by mouth at bedtime.) 30 tablet 11   docusate sodium (COLACE) 100 MG capsule Take 200 mg by mouth 2 (two) times daily. (Patient not taking: Reported on 10/09/2022)     doxycycline (ADOXA) 100 MG tablet Take 100 mg by mouth 2 (two) times daily. (Patient not taking: Reported on 10/09/2022)     HYDROcodone-acetaminophen (NORCO/VICODIN) 5-325 MG tablet Take 1 tablet by mouth 2 (two) times daily as needed for moderate  pain. (Patient not taking: Reported on 10/09/2022) 15 tablet 0   nystatin cream (MYCOSTATIN) Apply topically. (Patient not taking: Reported on 10/09/2022)     nystatin ointment (MYCOSTATIN) Apply topically. (Patient not taking: Reported on 10/09/2022)     No current facility-administered medications for this visit.     Past Medical History:  Diagnosis Date   (HFpEF) heart failure with preserved ejection fraction (HCC)    a. 09/2020 Echo: EF 65-70%, no rwma, GrI DD, nl RV fxn, mildly dil LA. Mild MR. Mild AS; b. 12/2020 Echo: EF 55-60%. Nl RV fxn.   Aortic insufficiency    a. 09/2020 Echo: No significant AI.   Chest pain    a. 10/2020 MV: EF 68%, no ischemia/scar.   Chronic respiratory failure (HCC)    DOE (dyspnea on exertion)    GERD (gastroesophageal reflux disease)    History of pneumonia     History of pulmonary embolism 2001   History of recurrent UTIs    History of stroke 2018   HTN (hypertension)    Hyperlipemia    Hypotension    a. On midodrine.   Mitral valve regurgitation    a. 09/2020 Echo: Mild MR.   Osteoporosis    PAF (paroxysmal atrial fibrillation) (HCC)    a. CHA2DS2VASc = 7-->Eliquis; b. 12/2020 Amio started-->DCCV x 1; c. 01/2021 ED eval for AF-->DCCV x 1.   Tremor    Vitamin D deficiency     ROS:   All systems reviewed and negative except as noted in the HPI.   Past Surgical History:  Procedure Laterality Date   BALLOON DILATION N/A 11/28/2020   Procedure: BALLOON DILATION;  Surgeon: Malissa Hippo, MD;  [facility];  Service: Endoscopy;  Laterality: N/A;   BLADDER SURGERY     x 3   CARDIOVERSION N/A 01/04/2021   Procedure: CARDIOVERSION;  Surgeon: Antoine Poche, MD;  Location: [facility];  Service: Endoscopy;  Laterality: N/A;   CERVICAL SPINE SURGERY     CHOLECYSTECTOMY     COLONOSCOPY     COLONOSCOPY WITH ESOPHAGOGASTRODUODENOSCOPY (EGD)     ESOPHAGOGASTRODUODENOSCOPY (EGD) WITH PROPOFOL N/A 11/28/2020   Procedure: ESOPHAGOGASTRODUODENOSCOPY (EGD) WITH PROPOFOL;  Surgeon: Malissa Hippo, MD;  [facility];  Service: Endoscopy;  Laterality: N/A;   LUMBAR SPINE SURGERY     REPLACEMENT TOTAL KNEE Left    RIGHT LUNG WEDGE REMOVAL     TONSILLECTOMY       Family History  Problem Relation Age of Onset   Alzheimer's disease Mother        died at 46   Cancer Mother    Tremor Mother    Pneumonia Father    Heart disease Father    Diabetes Father    Heart attack Father        died at 76     Social History   Socioeconomic History   Marital status: Widowed    Spouse name: Not on file   Number of children: 3   Years of education: 41   Highest education level: High school graduate  Occupational History   Occupation: Retired  Tobacco Use   Smoking status: Never   Smokeless tobacco: Never   Tobacco comments:     Never smoke 02/03/22  Vaping Use   Vaping status: Never Used  Substance and Sexual Activity   Alcohol use: Not Currently   Drug use: Never   Sexual activity: Not on file  Other Topics Concern  Not on file  Social History Narrative   Lives with her daughter.   Right-handed.   Rare caffeine use.   Social Determinants of Health   Financial Resource Strain: Not on file  Food Insecurity: Not on file  Transportation Needs: Not on file  Physical Activity: Not on file  Stress: Not on file  Social Connections: Not on file  Intimate Partner Violence: Not on file     BP 122/78 (BP Location: Left Arm, Patient Position: Sitting, Cuff Size: Normal)   Pulse 66   Ht 5\' 1"  (1.549 m)   Wt 194 lb 3.2 oz (88.1 kg)   SpO2 99%   BMI 36.69 kg/m   Physical Exam:  Well appearing NAD HEENT: Unremarkable Neck:  No JVD, no thyromegally Lymphatics:  No adenopathy Back:  No CVA tenderness Lungs:  Clear HEART:  Regular rate rhythm, no murmurs, no rubs, no clicks Abd:  soft, positive bowel sounds, no organomegally, no rebound, no guarding Ext:  2 plus pulses, no edema, no cyanosis, no clubbing Skin:  No rashes no nodules Neuro:  CN II through XII intact, motor grossly intact  EKG - nsr with normal QT interval.  Assess/Plan:  Atrial fib - she has been maintaining NSR. She will continue dofetilide. Interstitial lung disease - appears stable at least by exam. She will follow with Dr. Sherene Sires. Continue oxygen. Hypotension - she is improved Sob - better but still present. Insomnia - appears better on low dose zoloft   Monique Gowda Azlin Zilberman,MD

## 2022-10-09 NOTE — Patient Instructions (Signed)
Medication Instructions:  Your physician recommends that you continue on your current medications as directed. Please refer to the Current Medication list given to you today.  *If you need a refill on your cardiac medications before your next appointment, please call your pharmacy*   Lab Work: NONE   If you have labs (blood work) drawn today and your tests are completely normal, you will receive your results only by: MyChart Message (if you have MyChart) OR A paper copy in the mail If you have any lab test that is abnormal or we need to change your treatment, we will call you to review the results.   Testing/Procedures: NONE    Follow-Up: At [facility], you and your health needs are our priority.  As part of our continuing mission to provide you with exceptional heart care, we have created designated Provider Care Teams.  These Care Teams include your primary Cardiologist (physician) and Advanced Practice Providers (APPs -  Physician Assistants and Nurse Practitioners) who all work together to provide you with the care you need, when you need it.  We recommend signing up for the patient portal called "MyChart".  Sign up information is provided on this After Visit Summary.  MyChart is used to connect with patients for Virtual Visits (Telemedicine).  Patients are able to view lab/test results, encounter notes, upcoming appointments, etc.  Non-urgent messages can be sent to your provider as well.   To learn more about what you can do with MyChart, go to [email or web address].    Your next appointment:   6 month(s)  Provider:   Gregg Taylor, MD    Other Instructions Thank you for choosing [facility]!    

## 2022-10-10 DIAGNOSIS — M62522 Muscle wasting and atrophy, not elsewhere classified, left upper arm: Secondary | ICD-10-CM | POA: Diagnosis not present

## 2022-10-10 DIAGNOSIS — M62561 Muscle wasting and atrophy, not elsewhere classified, right lower leg: Secondary | ICD-10-CM | POA: Diagnosis not present

## 2022-10-10 DIAGNOSIS — M62562 Muscle wasting and atrophy, not elsewhere classified, left lower leg: Secondary | ICD-10-CM | POA: Diagnosis not present

## 2022-10-10 DIAGNOSIS — M62521 Muscle wasting and atrophy, not elsewhere classified, right upper arm: Secondary | ICD-10-CM | POA: Diagnosis not present

## 2022-10-10 DIAGNOSIS — R2689 Other abnormalities of gait and mobility: Secondary | ICD-10-CM | POA: Diagnosis not present

## 2022-10-11 DIAGNOSIS — M62521 Muscle wasting and atrophy, not elsewhere classified, right upper arm: Secondary | ICD-10-CM | POA: Diagnosis not present

## 2022-10-11 DIAGNOSIS — M62561 Muscle wasting and atrophy, not elsewhere classified, right lower leg: Secondary | ICD-10-CM | POA: Diagnosis not present

## 2022-10-11 DIAGNOSIS — R2689 Other abnormalities of gait and mobility: Secondary | ICD-10-CM | POA: Diagnosis not present

## 2022-10-11 DIAGNOSIS — M62522 Muscle wasting and atrophy, not elsewhere classified, left upper arm: Secondary | ICD-10-CM | POA: Diagnosis not present

## 2022-10-11 DIAGNOSIS — M62562 Muscle wasting and atrophy, not elsewhere classified, left lower leg: Secondary | ICD-10-CM | POA: Diagnosis not present

## 2022-10-14 DIAGNOSIS — M62562 Muscle wasting and atrophy, not elsewhere classified, left lower leg: Secondary | ICD-10-CM | POA: Diagnosis not present

## 2022-10-14 DIAGNOSIS — M62561 Muscle wasting and atrophy, not elsewhere classified, right lower leg: Secondary | ICD-10-CM | POA: Diagnosis not present

## 2022-10-14 DIAGNOSIS — M62522 Muscle wasting and atrophy, not elsewhere classified, left upper arm: Secondary | ICD-10-CM | POA: Diagnosis not present

## 2022-10-14 DIAGNOSIS — R2689 Other abnormalities of gait and mobility: Secondary | ICD-10-CM | POA: Diagnosis not present

## 2022-10-14 DIAGNOSIS — M62521 Muscle wasting and atrophy, not elsewhere classified, right upper arm: Secondary | ICD-10-CM | POA: Diagnosis not present

## 2022-10-15 DIAGNOSIS — M62562 Muscle wasting and atrophy, not elsewhere classified, left lower leg: Secondary | ICD-10-CM | POA: Diagnosis not present

## 2022-10-15 DIAGNOSIS — M62561 Muscle wasting and atrophy, not elsewhere classified, right lower leg: Secondary | ICD-10-CM | POA: Diagnosis not present

## 2022-10-15 DIAGNOSIS — M62522 Muscle wasting and atrophy, not elsewhere classified, left upper arm: Secondary | ICD-10-CM | POA: Diagnosis not present

## 2022-10-15 DIAGNOSIS — R2689 Other abnormalities of gait and mobility: Secondary | ICD-10-CM | POA: Diagnosis not present

## 2022-10-15 DIAGNOSIS — M62521 Muscle wasting and atrophy, not elsewhere classified, right upper arm: Secondary | ICD-10-CM | POA: Diagnosis not present

## 2022-10-16 DIAGNOSIS — M62521 Muscle wasting and atrophy, not elsewhere classified, right upper arm: Secondary | ICD-10-CM | POA: Diagnosis not present

## 2022-10-16 DIAGNOSIS — R2689 Other abnormalities of gait and mobility: Secondary | ICD-10-CM | POA: Diagnosis not present

## 2022-10-16 DIAGNOSIS — M62522 Muscle wasting and atrophy, not elsewhere classified, left upper arm: Secondary | ICD-10-CM | POA: Diagnosis not present

## 2022-10-16 DIAGNOSIS — M62562 Muscle wasting and atrophy, not elsewhere classified, left lower leg: Secondary | ICD-10-CM | POA: Diagnosis not present

## 2022-10-16 DIAGNOSIS — M62561 Muscle wasting and atrophy, not elsewhere classified, right lower leg: Secondary | ICD-10-CM | POA: Diagnosis not present

## 2022-10-17 DIAGNOSIS — R2689 Other abnormalities of gait and mobility: Secondary | ICD-10-CM | POA: Diagnosis not present

## 2022-10-17 DIAGNOSIS — M62521 Muscle wasting and atrophy, not elsewhere classified, right upper arm: Secondary | ICD-10-CM | POA: Diagnosis not present

## 2022-10-17 DIAGNOSIS — M62522 Muscle wasting and atrophy, not elsewhere classified, left upper arm: Secondary | ICD-10-CM | POA: Diagnosis not present

## 2022-10-17 DIAGNOSIS — M62561 Muscle wasting and atrophy, not elsewhere classified, right lower leg: Secondary | ICD-10-CM | POA: Diagnosis not present

## 2022-10-17 DIAGNOSIS — M62562 Muscle wasting and atrophy, not elsewhere classified, left lower leg: Secondary | ICD-10-CM | POA: Diagnosis not present

## 2022-10-18 DIAGNOSIS — R2689 Other abnormalities of gait and mobility: Secondary | ICD-10-CM | POA: Diagnosis not present

## 2022-10-18 DIAGNOSIS — M62561 Muscle wasting and atrophy, not elsewhere classified, right lower leg: Secondary | ICD-10-CM | POA: Diagnosis not present

## 2022-10-18 DIAGNOSIS — M62521 Muscle wasting and atrophy, not elsewhere classified, right upper arm: Secondary | ICD-10-CM | POA: Diagnosis not present

## 2022-10-18 DIAGNOSIS — M62562 Muscle wasting and atrophy, not elsewhere classified, left lower leg: Secondary | ICD-10-CM | POA: Diagnosis not present

## 2022-10-18 DIAGNOSIS — M62522 Muscle wasting and atrophy, not elsewhere classified, left upper arm: Secondary | ICD-10-CM | POA: Diagnosis not present

## 2022-10-21 DIAGNOSIS — M62522 Muscle wasting and atrophy, not elsewhere classified, left upper arm: Secondary | ICD-10-CM | POA: Diagnosis not present

## 2022-10-21 DIAGNOSIS — M62562 Muscle wasting and atrophy, not elsewhere classified, left lower leg: Secondary | ICD-10-CM | POA: Diagnosis not present

## 2022-10-21 DIAGNOSIS — M62521 Muscle wasting and atrophy, not elsewhere classified, right upper arm: Secondary | ICD-10-CM | POA: Diagnosis not present

## 2022-10-21 DIAGNOSIS — M62561 Muscle wasting and atrophy, not elsewhere classified, right lower leg: Secondary | ICD-10-CM | POA: Diagnosis not present

## 2022-10-21 DIAGNOSIS — R2689 Other abnormalities of gait and mobility: Secondary | ICD-10-CM | POA: Diagnosis not present

## 2022-10-22 DIAGNOSIS — R2689 Other abnormalities of gait and mobility: Secondary | ICD-10-CM | POA: Diagnosis not present

## 2022-10-22 DIAGNOSIS — M62561 Muscle wasting and atrophy, not elsewhere classified, right lower leg: Secondary | ICD-10-CM | POA: Diagnosis not present

## 2022-10-22 DIAGNOSIS — M62562 Muscle wasting and atrophy, not elsewhere classified, left lower leg: Secondary | ICD-10-CM | POA: Diagnosis not present

## 2022-10-22 DIAGNOSIS — M62522 Muscle wasting and atrophy, not elsewhere classified, left upper arm: Secondary | ICD-10-CM | POA: Diagnosis not present

## 2022-10-22 DIAGNOSIS — M62521 Muscle wasting and atrophy, not elsewhere classified, right upper arm: Secondary | ICD-10-CM | POA: Diagnosis not present

## 2022-10-23 DIAGNOSIS — M62522 Muscle wasting and atrophy, not elsewhere classified, left upper arm: Secondary | ICD-10-CM | POA: Diagnosis not present

## 2022-10-23 DIAGNOSIS — R2689 Other abnormalities of gait and mobility: Secondary | ICD-10-CM | POA: Diagnosis not present

## 2022-10-23 DIAGNOSIS — M62521 Muscle wasting and atrophy, not elsewhere classified, right upper arm: Secondary | ICD-10-CM | POA: Diagnosis not present

## 2022-10-23 DIAGNOSIS — M62561 Muscle wasting and atrophy, not elsewhere classified, right lower leg: Secondary | ICD-10-CM | POA: Diagnosis not present

## 2022-10-23 DIAGNOSIS — M62562 Muscle wasting and atrophy, not elsewhere classified, left lower leg: Secondary | ICD-10-CM | POA: Diagnosis not present

## 2022-10-24 DIAGNOSIS — M62522 Muscle wasting and atrophy, not elsewhere classified, left upper arm: Secondary | ICD-10-CM | POA: Diagnosis not present

## 2022-10-24 DIAGNOSIS — M62521 Muscle wasting and atrophy, not elsewhere classified, right upper arm: Secondary | ICD-10-CM | POA: Diagnosis not present

## 2022-10-24 DIAGNOSIS — M62562 Muscle wasting and atrophy, not elsewhere classified, left lower leg: Secondary | ICD-10-CM | POA: Diagnosis not present

## 2022-10-24 DIAGNOSIS — M62561 Muscle wasting and atrophy, not elsewhere classified, right lower leg: Secondary | ICD-10-CM | POA: Diagnosis not present

## 2022-10-24 DIAGNOSIS — R2689 Other abnormalities of gait and mobility: Secondary | ICD-10-CM | POA: Diagnosis not present

## 2022-10-25 DIAGNOSIS — M62521 Muscle wasting and atrophy, not elsewhere classified, right upper arm: Secondary | ICD-10-CM | POA: Diagnosis not present

## 2022-10-25 DIAGNOSIS — M62561 Muscle wasting and atrophy, not elsewhere classified, right lower leg: Secondary | ICD-10-CM | POA: Diagnosis not present

## 2022-10-25 DIAGNOSIS — M62522 Muscle wasting and atrophy, not elsewhere classified, left upper arm: Secondary | ICD-10-CM | POA: Diagnosis not present

## 2022-10-25 DIAGNOSIS — R2689 Other abnormalities of gait and mobility: Secondary | ICD-10-CM | POA: Diagnosis not present

## 2022-10-25 DIAGNOSIS — M62562 Muscle wasting and atrophy, not elsewhere classified, left lower leg: Secondary | ICD-10-CM | POA: Diagnosis not present

## 2022-10-28 DIAGNOSIS — M62522 Muscle wasting and atrophy, not elsewhere classified, left upper arm: Secondary | ICD-10-CM | POA: Diagnosis not present

## 2022-10-28 DIAGNOSIS — M62521 Muscle wasting and atrophy, not elsewhere classified, right upper arm: Secondary | ICD-10-CM | POA: Diagnosis not present

## 2022-10-28 DIAGNOSIS — R2689 Other abnormalities of gait and mobility: Secondary | ICD-10-CM | POA: Diagnosis not present

## 2022-10-28 DIAGNOSIS — M62561 Muscle wasting and atrophy, not elsewhere classified, right lower leg: Secondary | ICD-10-CM | POA: Diagnosis not present

## 2022-10-28 DIAGNOSIS — M62562 Muscle wasting and atrophy, not elsewhere classified, left lower leg: Secondary | ICD-10-CM | POA: Diagnosis not present

## 2022-10-29 DIAGNOSIS — R2689 Other abnormalities of gait and mobility: Secondary | ICD-10-CM | POA: Diagnosis not present

## 2022-10-29 DIAGNOSIS — M62522 Muscle wasting and atrophy, not elsewhere classified, left upper arm: Secondary | ICD-10-CM | POA: Diagnosis not present

## 2022-10-29 DIAGNOSIS — M62521 Muscle wasting and atrophy, not elsewhere classified, right upper arm: Secondary | ICD-10-CM | POA: Diagnosis not present

## 2022-10-29 DIAGNOSIS — M62561 Muscle wasting and atrophy, not elsewhere classified, right lower leg: Secondary | ICD-10-CM | POA: Diagnosis not present

## 2022-10-29 DIAGNOSIS — M62562 Muscle wasting and atrophy, not elsewhere classified, left lower leg: Secondary | ICD-10-CM | POA: Diagnosis not present

## 2022-10-30 DIAGNOSIS — M62561 Muscle wasting and atrophy, not elsewhere classified, right lower leg: Secondary | ICD-10-CM | POA: Diagnosis not present

## 2022-10-30 DIAGNOSIS — M62521 Muscle wasting and atrophy, not elsewhere classified, right upper arm: Secondary | ICD-10-CM | POA: Diagnosis not present

## 2022-10-30 DIAGNOSIS — R2689 Other abnormalities of gait and mobility: Secondary | ICD-10-CM | POA: Diagnosis not present

## 2022-10-30 DIAGNOSIS — M62562 Muscle wasting and atrophy, not elsewhere classified, left lower leg: Secondary | ICD-10-CM | POA: Diagnosis not present

## 2022-10-30 DIAGNOSIS — M62522 Muscle wasting and atrophy, not elsewhere classified, left upper arm: Secondary | ICD-10-CM | POA: Diagnosis not present

## 2022-10-31 DIAGNOSIS — R2689 Other abnormalities of gait and mobility: Secondary | ICD-10-CM | POA: Diagnosis not present

## 2022-10-31 DIAGNOSIS — M62521 Muscle wasting and atrophy, not elsewhere classified, right upper arm: Secondary | ICD-10-CM | POA: Diagnosis not present

## 2022-10-31 DIAGNOSIS — M62522 Muscle wasting and atrophy, not elsewhere classified, left upper arm: Secondary | ICD-10-CM | POA: Diagnosis not present

## 2022-10-31 DIAGNOSIS — M62562 Muscle wasting and atrophy, not elsewhere classified, left lower leg: Secondary | ICD-10-CM | POA: Diagnosis not present

## 2022-10-31 DIAGNOSIS — M62561 Muscle wasting and atrophy, not elsewhere classified, right lower leg: Secondary | ICD-10-CM | POA: Diagnosis not present

## 2022-11-04 DIAGNOSIS — M62521 Muscle wasting and atrophy, not elsewhere classified, right upper arm: Secondary | ICD-10-CM | POA: Diagnosis not present

## 2022-11-04 DIAGNOSIS — M62561 Muscle wasting and atrophy, not elsewhere classified, right lower leg: Secondary | ICD-10-CM | POA: Diagnosis not present

## 2022-11-04 DIAGNOSIS — M62562 Muscle wasting and atrophy, not elsewhere classified, left lower leg: Secondary | ICD-10-CM | POA: Diagnosis not present

## 2022-11-04 DIAGNOSIS — R2689 Other abnormalities of gait and mobility: Secondary | ICD-10-CM | POA: Diagnosis not present

## 2022-11-04 DIAGNOSIS — M62522 Muscle wasting and atrophy, not elsewhere classified, left upper arm: Secondary | ICD-10-CM | POA: Diagnosis not present

## 2022-11-06 DIAGNOSIS — M62521 Muscle wasting and atrophy, not elsewhere classified, right upper arm: Secondary | ICD-10-CM | POA: Diagnosis not present

## 2022-11-06 DIAGNOSIS — M62522 Muscle wasting and atrophy, not elsewhere classified, left upper arm: Secondary | ICD-10-CM | POA: Diagnosis not present

## 2022-11-06 DIAGNOSIS — R2689 Other abnormalities of gait and mobility: Secondary | ICD-10-CM | POA: Diagnosis not present

## 2022-11-06 DIAGNOSIS — M62562 Muscle wasting and atrophy, not elsewhere classified, left lower leg: Secondary | ICD-10-CM | POA: Diagnosis not present

## 2022-11-06 DIAGNOSIS — M62561 Muscle wasting and atrophy, not elsewhere classified, right lower leg: Secondary | ICD-10-CM | POA: Diagnosis not present

## 2022-11-07 DIAGNOSIS — M62521 Muscle wasting and atrophy, not elsewhere classified, right upper arm: Secondary | ICD-10-CM | POA: Diagnosis not present

## 2022-11-07 DIAGNOSIS — M62522 Muscle wasting and atrophy, not elsewhere classified, left upper arm: Secondary | ICD-10-CM | POA: Diagnosis not present

## 2022-11-07 DIAGNOSIS — M62562 Muscle wasting and atrophy, not elsewhere classified, left lower leg: Secondary | ICD-10-CM | POA: Diagnosis not present

## 2022-11-07 DIAGNOSIS — R2689 Other abnormalities of gait and mobility: Secondary | ICD-10-CM | POA: Diagnosis not present

## 2022-11-07 DIAGNOSIS — M62561 Muscle wasting and atrophy, not elsewhere classified, right lower leg: Secondary | ICD-10-CM | POA: Diagnosis not present

## 2022-11-08 DIAGNOSIS — R2689 Other abnormalities of gait and mobility: Secondary | ICD-10-CM | POA: Diagnosis not present

## 2022-11-08 DIAGNOSIS — M62561 Muscle wasting and atrophy, not elsewhere classified, right lower leg: Secondary | ICD-10-CM | POA: Diagnosis not present

## 2022-11-08 DIAGNOSIS — M62562 Muscle wasting and atrophy, not elsewhere classified, left lower leg: Secondary | ICD-10-CM | POA: Diagnosis not present

## 2022-11-08 DIAGNOSIS — M62521 Muscle wasting and atrophy, not elsewhere classified, right upper arm: Secondary | ICD-10-CM | POA: Diagnosis not present

## 2022-11-08 DIAGNOSIS — M62522 Muscle wasting and atrophy, not elsewhere classified, left upper arm: Secondary | ICD-10-CM | POA: Diagnosis not present

## 2022-11-11 DIAGNOSIS — M62561 Muscle wasting and atrophy, not elsewhere classified, right lower leg: Secondary | ICD-10-CM | POA: Diagnosis not present

## 2022-11-11 DIAGNOSIS — R2689 Other abnormalities of gait and mobility: Secondary | ICD-10-CM | POA: Diagnosis not present

## 2022-11-11 DIAGNOSIS — M62562 Muscle wasting and atrophy, not elsewhere classified, left lower leg: Secondary | ICD-10-CM | POA: Diagnosis not present

## 2022-11-12 DIAGNOSIS — R2689 Other abnormalities of gait and mobility: Secondary | ICD-10-CM | POA: Diagnosis not present

## 2022-11-12 DIAGNOSIS — M62561 Muscle wasting and atrophy, not elsewhere classified, right lower leg: Secondary | ICD-10-CM | POA: Diagnosis not present

## 2022-11-12 DIAGNOSIS — M62562 Muscle wasting and atrophy, not elsewhere classified, left lower leg: Secondary | ICD-10-CM | POA: Diagnosis not present

## 2022-11-14 DIAGNOSIS — I10 Essential (primary) hypertension: Secondary | ICD-10-CM | POA: Diagnosis not present

## 2022-11-14 DIAGNOSIS — I5032 Chronic diastolic (congestive) heart failure: Secondary | ICD-10-CM | POA: Diagnosis not present

## 2022-11-14 DIAGNOSIS — J9611 Chronic respiratory failure with hypoxia: Secondary | ICD-10-CM | POA: Diagnosis not present

## 2022-11-19 DIAGNOSIS — M62562 Muscle wasting and atrophy, not elsewhere classified, left lower leg: Secondary | ICD-10-CM | POA: Diagnosis not present

## 2022-11-19 DIAGNOSIS — R2689 Other abnormalities of gait and mobility: Secondary | ICD-10-CM | POA: Diagnosis not present

## 2022-11-19 DIAGNOSIS — M62561 Muscle wasting and atrophy, not elsewhere classified, right lower leg: Secondary | ICD-10-CM | POA: Diagnosis not present

## 2022-11-20 DIAGNOSIS — M62561 Muscle wasting and atrophy, not elsewhere classified, right lower leg: Secondary | ICD-10-CM | POA: Diagnosis not present

## 2022-11-20 DIAGNOSIS — R2689 Other abnormalities of gait and mobility: Secondary | ICD-10-CM | POA: Diagnosis not present

## 2022-11-20 DIAGNOSIS — M62562 Muscle wasting and atrophy, not elsewhere classified, left lower leg: Secondary | ICD-10-CM | POA: Diagnosis not present

## 2022-11-21 DIAGNOSIS — Q630 Accessory kidney: Secondary | ICD-10-CM | POA: Diagnosis not present

## 2022-11-21 DIAGNOSIS — K922 Gastrointestinal hemorrhage, unspecified: Secondary | ICD-10-CM | POA: Diagnosis not present

## 2022-11-21 DIAGNOSIS — Z66 Do not resuscitate: Secondary | ICD-10-CM | POA: Diagnosis not present

## 2022-11-21 DIAGNOSIS — Z7901 Long term (current) use of anticoagulants: Secondary | ICD-10-CM | POA: Diagnosis not present

## 2022-11-21 DIAGNOSIS — K649 Unspecified hemorrhoids: Secondary | ICD-10-CM | POA: Diagnosis not present

## 2022-11-21 DIAGNOSIS — Q600 Renal agenesis, unilateral: Secondary | ICD-10-CM | POA: Diagnosis not present

## 2022-11-21 DIAGNOSIS — K449 Diaphragmatic hernia without obstruction or gangrene: Secondary | ICD-10-CM | POA: Diagnosis not present

## 2022-11-21 DIAGNOSIS — K5730 Diverticulosis of large intestine without perforation or abscess without bleeding: Secondary | ICD-10-CM | POA: Diagnosis not present

## 2022-11-21 DIAGNOSIS — M62562 Muscle wasting and atrophy, not elsewhere classified, left lower leg: Secondary | ICD-10-CM | POA: Diagnosis not present

## 2022-11-21 DIAGNOSIS — R2689 Other abnormalities of gait and mobility: Secondary | ICD-10-CM | POA: Diagnosis not present

## 2022-11-21 DIAGNOSIS — I700 Atherosclerosis of aorta: Secondary | ICD-10-CM | POA: Diagnosis not present

## 2022-11-21 DIAGNOSIS — K644 Residual hemorrhoidal skin tags: Secondary | ICD-10-CM | POA: Diagnosis not present

## 2022-11-21 DIAGNOSIS — Z881 Allergy status to other antibiotic agents status: Secondary | ICD-10-CM | POA: Diagnosis not present

## 2022-11-21 DIAGNOSIS — Z79899 Other long term (current) drug therapy: Secondary | ICD-10-CM | POA: Diagnosis not present

## 2022-11-21 DIAGNOSIS — M62561 Muscle wasting and atrophy, not elsewhere classified, right lower leg: Secondary | ICD-10-CM | POA: Diagnosis not present

## 2022-11-21 DIAGNOSIS — K625 Hemorrhage of anus and rectum: Secondary | ICD-10-CM | POA: Diagnosis not present

## 2022-11-21 DIAGNOSIS — Z886 Allergy status to analgesic agent status: Secondary | ICD-10-CM | POA: Diagnosis not present

## 2022-11-21 DIAGNOSIS — I2510 Atherosclerotic heart disease of native coronary artery without angina pectoris: Secondary | ICD-10-CM | POA: Diagnosis not present

## 2022-11-21 DIAGNOSIS — Z882 Allergy status to sulfonamides status: Secondary | ICD-10-CM | POA: Diagnosis not present

## 2022-11-21 DIAGNOSIS — I4891 Unspecified atrial fibrillation: Secondary | ICD-10-CM | POA: Diagnosis not present

## 2022-11-21 DIAGNOSIS — Z888 Allergy status to other drugs, medicaments and biological substances status: Secondary | ICD-10-CM | POA: Diagnosis not present

## 2022-11-21 DIAGNOSIS — R58 Hemorrhage, not elsewhere classified: Secondary | ICD-10-CM | POA: Diagnosis not present

## 2022-11-21 DIAGNOSIS — Z743 Need for continuous supervision: Secondary | ICD-10-CM | POA: Diagnosis not present

## 2022-11-21 DIAGNOSIS — R6889 Other general symptoms and signs: Secondary | ICD-10-CM | POA: Diagnosis not present

## 2022-11-22 DIAGNOSIS — I10 Essential (primary) hypertension: Secondary | ICD-10-CM | POA: Diagnosis not present

## 2022-11-22 DIAGNOSIS — I5032 Chronic diastolic (congestive) heart failure: Secondary | ICD-10-CM | POA: Diagnosis not present

## 2022-11-22 DIAGNOSIS — J9611 Chronic respiratory failure with hypoxia: Secondary | ICD-10-CM | POA: Diagnosis not present

## 2022-11-22 DIAGNOSIS — J8410 Pulmonary fibrosis, unspecified: Secondary | ICD-10-CM | POA: Diagnosis not present

## 2022-11-22 DIAGNOSIS — K649 Unspecified hemorrhoids: Secondary | ICD-10-CM | POA: Diagnosis not present

## 2022-11-23 DIAGNOSIS — J8410 Pulmonary fibrosis, unspecified: Secondary | ICD-10-CM | POA: Diagnosis not present

## 2022-11-23 DIAGNOSIS — I10 Essential (primary) hypertension: Secondary | ICD-10-CM | POA: Diagnosis not present

## 2022-11-23 DIAGNOSIS — K649 Unspecified hemorrhoids: Secondary | ICD-10-CM | POA: Diagnosis not present

## 2022-11-23 DIAGNOSIS — I5032 Chronic diastolic (congestive) heart failure: Secondary | ICD-10-CM | POA: Diagnosis not present

## 2022-11-23 DIAGNOSIS — J9611 Chronic respiratory failure with hypoxia: Secondary | ICD-10-CM | POA: Diagnosis not present

## 2022-12-08 DIAGNOSIS — L602 Onychogryphosis: Secondary | ICD-10-CM | POA: Diagnosis not present

## 2022-12-08 DIAGNOSIS — I739 Peripheral vascular disease, unspecified: Secondary | ICD-10-CM | POA: Diagnosis not present

## 2022-12-08 DIAGNOSIS — L603 Nail dystrophy: Secondary | ICD-10-CM | POA: Diagnosis not present

## 2023-01-09 DIAGNOSIS — I10 Essential (primary) hypertension: Secondary | ICD-10-CM | POA: Diagnosis not present

## 2023-01-09 DIAGNOSIS — J9611 Chronic respiratory failure with hypoxia: Secondary | ICD-10-CM | POA: Diagnosis not present

## 2023-01-09 DIAGNOSIS — I5032 Chronic diastolic (congestive) heart failure: Secondary | ICD-10-CM | POA: Diagnosis not present

## 2023-01-09 DIAGNOSIS — J8410 Pulmonary fibrosis, unspecified: Secondary | ICD-10-CM | POA: Diagnosis not present

## 2023-01-09 DIAGNOSIS — D62 Acute posthemorrhagic anemia: Secondary | ICD-10-CM | POA: Diagnosis not present

## 2023-01-13 DIAGNOSIS — I5032 Chronic diastolic (congestive) heart failure: Secondary | ICD-10-CM | POA: Diagnosis not present

## 2023-01-13 DIAGNOSIS — I4891 Unspecified atrial fibrillation: Secondary | ICD-10-CM | POA: Diagnosis not present

## 2023-02-17 DIAGNOSIS — L602 Onychogryphosis: Secondary | ICD-10-CM | POA: Diagnosis not present

## 2023-02-17 DIAGNOSIS — I739 Peripheral vascular disease, unspecified: Secondary | ICD-10-CM | POA: Diagnosis not present

## 2023-02-17 DIAGNOSIS — L603 Nail dystrophy: Secondary | ICD-10-CM | POA: Diagnosis not present

## 2023-03-01 DIAGNOSIS — D62 Acute posthemorrhagic anemia: Secondary | ICD-10-CM | POA: Diagnosis not present

## 2023-03-01 DIAGNOSIS — I10 Essential (primary) hypertension: Secondary | ICD-10-CM | POA: Diagnosis not present

## 2023-03-01 DIAGNOSIS — I5032 Chronic diastolic (congestive) heart failure: Secondary | ICD-10-CM | POA: Diagnosis not present

## 2023-03-01 DIAGNOSIS — J9611 Chronic respiratory failure with hypoxia: Secondary | ICD-10-CM | POA: Diagnosis not present

## 2023-03-01 DIAGNOSIS — J8410 Pulmonary fibrosis, unspecified: Secondary | ICD-10-CM | POA: Diagnosis not present

## 2023-04-09 DIAGNOSIS — M62561 Muscle wasting and atrophy, not elsewhere classified, right lower leg: Secondary | ICD-10-CM | POA: Diagnosis not present

## 2023-04-09 DIAGNOSIS — M62521 Muscle wasting and atrophy, not elsewhere classified, right upper arm: Secondary | ICD-10-CM | POA: Diagnosis not present

## 2023-04-09 DIAGNOSIS — M62522 Muscle wasting and atrophy, not elsewhere classified, left upper arm: Secondary | ICD-10-CM | POA: Diagnosis not present

## 2023-04-09 DIAGNOSIS — M62562 Muscle wasting and atrophy, not elsewhere classified, left lower leg: Secondary | ICD-10-CM | POA: Diagnosis not present

## 2023-04-09 DIAGNOSIS — R2689 Other abnormalities of gait and mobility: Secondary | ICD-10-CM | POA: Diagnosis not present

## 2023-04-13 DIAGNOSIS — M62522 Muscle wasting and atrophy, not elsewhere classified, left upper arm: Secondary | ICD-10-CM | POA: Diagnosis not present

## 2023-04-13 DIAGNOSIS — M62562 Muscle wasting and atrophy, not elsewhere classified, left lower leg: Secondary | ICD-10-CM | POA: Diagnosis not present

## 2023-04-13 DIAGNOSIS — R2689 Other abnormalities of gait and mobility: Secondary | ICD-10-CM | POA: Diagnosis not present

## 2023-04-13 DIAGNOSIS — M62521 Muscle wasting and atrophy, not elsewhere classified, right upper arm: Secondary | ICD-10-CM | POA: Diagnosis not present

## 2023-04-13 DIAGNOSIS — M62561 Muscle wasting and atrophy, not elsewhere classified, right lower leg: Secondary | ICD-10-CM | POA: Diagnosis not present

## 2023-04-18 DIAGNOSIS — M62561 Muscle wasting and atrophy, not elsewhere classified, right lower leg: Secondary | ICD-10-CM | POA: Diagnosis not present

## 2023-04-18 DIAGNOSIS — M62521 Muscle wasting and atrophy, not elsewhere classified, right upper arm: Secondary | ICD-10-CM | POA: Diagnosis not present

## 2023-04-18 DIAGNOSIS — R2689 Other abnormalities of gait and mobility: Secondary | ICD-10-CM | POA: Diagnosis not present

## 2023-04-18 DIAGNOSIS — M62562 Muscle wasting and atrophy, not elsewhere classified, left lower leg: Secondary | ICD-10-CM | POA: Diagnosis not present

## 2023-04-18 DIAGNOSIS — M62522 Muscle wasting and atrophy, not elsewhere classified, left upper arm: Secondary | ICD-10-CM | POA: Diagnosis not present

## 2023-04-21 DIAGNOSIS — M62521 Muscle wasting and atrophy, not elsewhere classified, right upper arm: Secondary | ICD-10-CM | POA: Diagnosis not present

## 2023-04-21 DIAGNOSIS — R2689 Other abnormalities of gait and mobility: Secondary | ICD-10-CM | POA: Diagnosis not present

## 2023-04-21 DIAGNOSIS — M62562 Muscle wasting and atrophy, not elsewhere classified, left lower leg: Secondary | ICD-10-CM | POA: Diagnosis not present

## 2023-04-21 DIAGNOSIS — M62522 Muscle wasting and atrophy, not elsewhere classified, left upper arm: Secondary | ICD-10-CM | POA: Diagnosis not present

## 2023-04-21 DIAGNOSIS — M62561 Muscle wasting and atrophy, not elsewhere classified, right lower leg: Secondary | ICD-10-CM | POA: Diagnosis not present

## 2023-04-22 DIAGNOSIS — M62561 Muscle wasting and atrophy, not elsewhere classified, right lower leg: Secondary | ICD-10-CM | POA: Diagnosis not present

## 2023-04-22 DIAGNOSIS — M62521 Muscle wasting and atrophy, not elsewhere classified, right upper arm: Secondary | ICD-10-CM | POA: Diagnosis not present

## 2023-04-22 DIAGNOSIS — M62522 Muscle wasting and atrophy, not elsewhere classified, left upper arm: Secondary | ICD-10-CM | POA: Diagnosis not present

## 2023-04-22 DIAGNOSIS — R2689 Other abnormalities of gait and mobility: Secondary | ICD-10-CM | POA: Diagnosis not present

## 2023-04-22 DIAGNOSIS — M62562 Muscle wasting and atrophy, not elsewhere classified, left lower leg: Secondary | ICD-10-CM | POA: Diagnosis not present

## 2023-04-23 DIAGNOSIS — M62562 Muscle wasting and atrophy, not elsewhere classified, left lower leg: Secondary | ICD-10-CM | POA: Diagnosis not present

## 2023-04-23 DIAGNOSIS — M62522 Muscle wasting and atrophy, not elsewhere classified, left upper arm: Secondary | ICD-10-CM | POA: Diagnosis not present

## 2023-04-23 DIAGNOSIS — R2689 Other abnormalities of gait and mobility: Secondary | ICD-10-CM | POA: Diagnosis not present

## 2023-04-23 DIAGNOSIS — M62561 Muscle wasting and atrophy, not elsewhere classified, right lower leg: Secondary | ICD-10-CM | POA: Diagnosis not present

## 2023-04-23 DIAGNOSIS — M62521 Muscle wasting and atrophy, not elsewhere classified, right upper arm: Secondary | ICD-10-CM | POA: Diagnosis not present

## 2023-04-24 DIAGNOSIS — M62562 Muscle wasting and atrophy, not elsewhere classified, left lower leg: Secondary | ICD-10-CM | POA: Diagnosis not present

## 2023-04-24 DIAGNOSIS — M62522 Muscle wasting and atrophy, not elsewhere classified, left upper arm: Secondary | ICD-10-CM | POA: Diagnosis not present

## 2023-04-24 DIAGNOSIS — R2689 Other abnormalities of gait and mobility: Secondary | ICD-10-CM | POA: Diagnosis not present

## 2023-04-24 DIAGNOSIS — M62561 Muscle wasting and atrophy, not elsewhere classified, right lower leg: Secondary | ICD-10-CM | POA: Diagnosis not present

## 2023-04-24 DIAGNOSIS — M62521 Muscle wasting and atrophy, not elsewhere classified, right upper arm: Secondary | ICD-10-CM | POA: Diagnosis not present

## 2023-04-25 DIAGNOSIS — M62521 Muscle wasting and atrophy, not elsewhere classified, right upper arm: Secondary | ICD-10-CM | POA: Diagnosis not present

## 2023-04-25 DIAGNOSIS — I5032 Chronic diastolic (congestive) heart failure: Secondary | ICD-10-CM | POA: Diagnosis not present

## 2023-04-25 DIAGNOSIS — R2689 Other abnormalities of gait and mobility: Secondary | ICD-10-CM | POA: Diagnosis not present

## 2023-04-25 DIAGNOSIS — M62522 Muscle wasting and atrophy, not elsewhere classified, left upper arm: Secondary | ICD-10-CM | POA: Diagnosis not present

## 2023-04-25 DIAGNOSIS — M62561 Muscle wasting and atrophy, not elsewhere classified, right lower leg: Secondary | ICD-10-CM | POA: Diagnosis not present

## 2023-04-25 DIAGNOSIS — J9611 Chronic respiratory failure with hypoxia: Secondary | ICD-10-CM | POA: Diagnosis not present

## 2023-04-25 DIAGNOSIS — D62 Acute posthemorrhagic anemia: Secondary | ICD-10-CM | POA: Diagnosis not present

## 2023-04-25 DIAGNOSIS — M62562 Muscle wasting and atrophy, not elsewhere classified, left lower leg: Secondary | ICD-10-CM | POA: Diagnosis not present

## 2023-04-27 DIAGNOSIS — M62521 Muscle wasting and atrophy, not elsewhere classified, right upper arm: Secondary | ICD-10-CM | POA: Diagnosis not present

## 2023-04-27 DIAGNOSIS — M62561 Muscle wasting and atrophy, not elsewhere classified, right lower leg: Secondary | ICD-10-CM | POA: Diagnosis not present

## 2023-04-27 DIAGNOSIS — M62562 Muscle wasting and atrophy, not elsewhere classified, left lower leg: Secondary | ICD-10-CM | POA: Diagnosis not present

## 2023-04-27 DIAGNOSIS — R2689 Other abnormalities of gait and mobility: Secondary | ICD-10-CM | POA: Diagnosis not present

## 2023-04-27 DIAGNOSIS — M62522 Muscle wasting and atrophy, not elsewhere classified, left upper arm: Secondary | ICD-10-CM | POA: Diagnosis not present

## 2023-04-28 DIAGNOSIS — M62522 Muscle wasting and atrophy, not elsewhere classified, left upper arm: Secondary | ICD-10-CM | POA: Diagnosis not present

## 2023-04-28 DIAGNOSIS — M62521 Muscle wasting and atrophy, not elsewhere classified, right upper arm: Secondary | ICD-10-CM | POA: Diagnosis not present

## 2023-04-28 DIAGNOSIS — R197 Diarrhea, unspecified: Secondary | ICD-10-CM | POA: Diagnosis not present

## 2023-04-28 DIAGNOSIS — R2689 Other abnormalities of gait and mobility: Secondary | ICD-10-CM | POA: Diagnosis not present

## 2023-04-28 DIAGNOSIS — M62561 Muscle wasting and atrophy, not elsewhere classified, right lower leg: Secondary | ICD-10-CM | POA: Diagnosis not present

## 2023-04-28 DIAGNOSIS — M62562 Muscle wasting and atrophy, not elsewhere classified, left lower leg: Secondary | ICD-10-CM | POA: Diagnosis not present

## 2023-04-29 DIAGNOSIS — M62561 Muscle wasting and atrophy, not elsewhere classified, right lower leg: Secondary | ICD-10-CM | POA: Diagnosis not present

## 2023-04-29 DIAGNOSIS — M62522 Muscle wasting and atrophy, not elsewhere classified, left upper arm: Secondary | ICD-10-CM | POA: Diagnosis not present

## 2023-04-29 DIAGNOSIS — M62521 Muscle wasting and atrophy, not elsewhere classified, right upper arm: Secondary | ICD-10-CM | POA: Diagnosis not present

## 2023-04-29 DIAGNOSIS — R2689 Other abnormalities of gait and mobility: Secondary | ICD-10-CM | POA: Diagnosis not present

## 2023-04-29 DIAGNOSIS — M62562 Muscle wasting and atrophy, not elsewhere classified, left lower leg: Secondary | ICD-10-CM | POA: Diagnosis not present

## 2023-04-30 DIAGNOSIS — M62562 Muscle wasting and atrophy, not elsewhere classified, left lower leg: Secondary | ICD-10-CM | POA: Diagnosis not present

## 2023-04-30 DIAGNOSIS — R059 Cough, unspecified: Secondary | ICD-10-CM | POA: Diagnosis not present

## 2023-04-30 DIAGNOSIS — M62521 Muscle wasting and atrophy, not elsewhere classified, right upper arm: Secondary | ICD-10-CM | POA: Diagnosis not present

## 2023-04-30 DIAGNOSIS — M62561 Muscle wasting and atrophy, not elsewhere classified, right lower leg: Secondary | ICD-10-CM | POA: Diagnosis not present

## 2023-04-30 DIAGNOSIS — M62522 Muscle wasting and atrophy, not elsewhere classified, left upper arm: Secondary | ICD-10-CM | POA: Diagnosis not present

## 2023-04-30 DIAGNOSIS — R2689 Other abnormalities of gait and mobility: Secondary | ICD-10-CM | POA: Diagnosis not present

## 2023-05-01 DIAGNOSIS — M62522 Muscle wasting and atrophy, not elsewhere classified, left upper arm: Secondary | ICD-10-CM | POA: Diagnosis not present

## 2023-05-01 DIAGNOSIS — M62561 Muscle wasting and atrophy, not elsewhere classified, right lower leg: Secondary | ICD-10-CM | POA: Diagnosis not present

## 2023-05-01 DIAGNOSIS — M62562 Muscle wasting and atrophy, not elsewhere classified, left lower leg: Secondary | ICD-10-CM | POA: Diagnosis not present

## 2023-05-01 DIAGNOSIS — R2689 Other abnormalities of gait and mobility: Secondary | ICD-10-CM | POA: Diagnosis not present

## 2023-05-01 DIAGNOSIS — M62521 Muscle wasting and atrophy, not elsewhere classified, right upper arm: Secondary | ICD-10-CM | POA: Diagnosis not present

## 2023-05-02 DIAGNOSIS — R0902 Hypoxemia: Secondary | ICD-10-CM | POA: Diagnosis not present

## 2023-05-04 DIAGNOSIS — M62561 Muscle wasting and atrophy, not elsewhere classified, right lower leg: Secondary | ICD-10-CM | POA: Diagnosis not present

## 2023-05-04 DIAGNOSIS — M62522 Muscle wasting and atrophy, not elsewhere classified, left upper arm: Secondary | ICD-10-CM | POA: Diagnosis not present

## 2023-05-04 DIAGNOSIS — M62521 Muscle wasting and atrophy, not elsewhere classified, right upper arm: Secondary | ICD-10-CM | POA: Diagnosis not present

## 2023-05-04 DIAGNOSIS — R2689 Other abnormalities of gait and mobility: Secondary | ICD-10-CM | POA: Diagnosis not present

## 2023-05-04 DIAGNOSIS — M62562 Muscle wasting and atrophy, not elsewhere classified, left lower leg: Secondary | ICD-10-CM | POA: Diagnosis not present

## 2023-05-05 DIAGNOSIS — M62562 Muscle wasting and atrophy, not elsewhere classified, left lower leg: Secondary | ICD-10-CM | POA: Diagnosis not present

## 2023-05-05 DIAGNOSIS — R2689 Other abnormalities of gait and mobility: Secondary | ICD-10-CM | POA: Diagnosis not present

## 2023-05-05 DIAGNOSIS — M62521 Muscle wasting and atrophy, not elsewhere classified, right upper arm: Secondary | ICD-10-CM | POA: Diagnosis not present

## 2023-05-05 DIAGNOSIS — M62561 Muscle wasting and atrophy, not elsewhere classified, right lower leg: Secondary | ICD-10-CM | POA: Diagnosis not present

## 2023-05-05 DIAGNOSIS — M62522 Muscle wasting and atrophy, not elsewhere classified, left upper arm: Secondary | ICD-10-CM | POA: Diagnosis not present

## 2023-05-06 ENCOUNTER — Ambulatory Visit: Payer: Medicare Other | Admitting: Internal Medicine

## 2023-05-06 DIAGNOSIS — M62561 Muscle wasting and atrophy, not elsewhere classified, right lower leg: Secondary | ICD-10-CM | POA: Diagnosis not present

## 2023-05-06 DIAGNOSIS — M62522 Muscle wasting and atrophy, not elsewhere classified, left upper arm: Secondary | ICD-10-CM | POA: Diagnosis not present

## 2023-05-06 DIAGNOSIS — R2689 Other abnormalities of gait and mobility: Secondary | ICD-10-CM | POA: Diagnosis not present

## 2023-05-06 DIAGNOSIS — M62521 Muscle wasting and atrophy, not elsewhere classified, right upper arm: Secondary | ICD-10-CM | POA: Diagnosis not present

## 2023-05-06 DIAGNOSIS — M62562 Muscle wasting and atrophy, not elsewhere classified, left lower leg: Secondary | ICD-10-CM | POA: Diagnosis not present

## 2023-05-07 DIAGNOSIS — M62562 Muscle wasting and atrophy, not elsewhere classified, left lower leg: Secondary | ICD-10-CM | POA: Diagnosis not present

## 2023-05-07 DIAGNOSIS — M62522 Muscle wasting and atrophy, not elsewhere classified, left upper arm: Secondary | ICD-10-CM | POA: Diagnosis not present

## 2023-05-07 DIAGNOSIS — M62521 Muscle wasting and atrophy, not elsewhere classified, right upper arm: Secondary | ICD-10-CM | POA: Diagnosis not present

## 2023-05-07 DIAGNOSIS — R2689 Other abnormalities of gait and mobility: Secondary | ICD-10-CM | POA: Diagnosis not present

## 2023-05-07 DIAGNOSIS — M62561 Muscle wasting and atrophy, not elsewhere classified, right lower leg: Secondary | ICD-10-CM | POA: Diagnosis not present

## 2023-05-08 DIAGNOSIS — M62562 Muscle wasting and atrophy, not elsewhere classified, left lower leg: Secondary | ICD-10-CM | POA: Diagnosis not present

## 2023-05-08 DIAGNOSIS — M62561 Muscle wasting and atrophy, not elsewhere classified, right lower leg: Secondary | ICD-10-CM | POA: Diagnosis not present

## 2023-05-08 DIAGNOSIS — M62521 Muscle wasting and atrophy, not elsewhere classified, right upper arm: Secondary | ICD-10-CM | POA: Diagnosis not present

## 2023-05-08 DIAGNOSIS — M62522 Muscle wasting and atrophy, not elsewhere classified, left upper arm: Secondary | ICD-10-CM | POA: Diagnosis not present

## 2023-05-08 DIAGNOSIS — R2689 Other abnormalities of gait and mobility: Secondary | ICD-10-CM | POA: Diagnosis not present

## 2023-05-11 DIAGNOSIS — R2689 Other abnormalities of gait and mobility: Secondary | ICD-10-CM | POA: Diagnosis not present

## 2023-05-11 DIAGNOSIS — M62562 Muscle wasting and atrophy, not elsewhere classified, left lower leg: Secondary | ICD-10-CM | POA: Diagnosis not present

## 2023-05-11 DIAGNOSIS — M62522 Muscle wasting and atrophy, not elsewhere classified, left upper arm: Secondary | ICD-10-CM | POA: Diagnosis not present

## 2023-05-11 DIAGNOSIS — M62521 Muscle wasting and atrophy, not elsewhere classified, right upper arm: Secondary | ICD-10-CM | POA: Diagnosis not present

## 2023-05-11 DIAGNOSIS — M62561 Muscle wasting and atrophy, not elsewhere classified, right lower leg: Secondary | ICD-10-CM | POA: Diagnosis not present

## 2023-05-12 DIAGNOSIS — M62521 Muscle wasting and atrophy, not elsewhere classified, right upper arm: Secondary | ICD-10-CM | POA: Diagnosis not present

## 2023-05-12 DIAGNOSIS — M62522 Muscle wasting and atrophy, not elsewhere classified, left upper arm: Secondary | ICD-10-CM | POA: Diagnosis not present

## 2023-05-12 DIAGNOSIS — R2689 Other abnormalities of gait and mobility: Secondary | ICD-10-CM | POA: Diagnosis not present

## 2023-05-12 DIAGNOSIS — M62561 Muscle wasting and atrophy, not elsewhere classified, right lower leg: Secondary | ICD-10-CM | POA: Diagnosis not present

## 2023-05-12 DIAGNOSIS — M62562 Muscle wasting and atrophy, not elsewhere classified, left lower leg: Secondary | ICD-10-CM | POA: Diagnosis not present

## 2023-05-13 DIAGNOSIS — M62521 Muscle wasting and atrophy, not elsewhere classified, right upper arm: Secondary | ICD-10-CM | POA: Diagnosis not present

## 2023-05-13 DIAGNOSIS — M62522 Muscle wasting and atrophy, not elsewhere classified, left upper arm: Secondary | ICD-10-CM | POA: Diagnosis not present

## 2023-05-13 DIAGNOSIS — R2689 Other abnormalities of gait and mobility: Secondary | ICD-10-CM | POA: Diagnosis not present

## 2023-05-13 DIAGNOSIS — M62561 Muscle wasting and atrophy, not elsewhere classified, right lower leg: Secondary | ICD-10-CM | POA: Diagnosis not present

## 2023-05-13 DIAGNOSIS — M62562 Muscle wasting and atrophy, not elsewhere classified, left lower leg: Secondary | ICD-10-CM | POA: Diagnosis not present

## 2023-05-15 DIAGNOSIS — M62522 Muscle wasting and atrophy, not elsewhere classified, left upper arm: Secondary | ICD-10-CM | POA: Diagnosis not present

## 2023-05-15 DIAGNOSIS — M62562 Muscle wasting and atrophy, not elsewhere classified, left lower leg: Secondary | ICD-10-CM | POA: Diagnosis not present

## 2023-05-15 DIAGNOSIS — M62561 Muscle wasting and atrophy, not elsewhere classified, right lower leg: Secondary | ICD-10-CM | POA: Diagnosis not present

## 2023-05-15 DIAGNOSIS — R2689 Other abnormalities of gait and mobility: Secondary | ICD-10-CM | POA: Diagnosis not present

## 2023-05-15 DIAGNOSIS — M62521 Muscle wasting and atrophy, not elsewhere classified, right upper arm: Secondary | ICD-10-CM | POA: Diagnosis not present

## 2023-05-16 DIAGNOSIS — R2689 Other abnormalities of gait and mobility: Secondary | ICD-10-CM | POA: Diagnosis not present

## 2023-05-16 DIAGNOSIS — R0602 Shortness of breath: Secondary | ICD-10-CM | POA: Diagnosis not present

## 2023-05-16 DIAGNOSIS — M62562 Muscle wasting and atrophy, not elsewhere classified, left lower leg: Secondary | ICD-10-CM | POA: Diagnosis not present

## 2023-05-16 DIAGNOSIS — M62521 Muscle wasting and atrophy, not elsewhere classified, right upper arm: Secondary | ICD-10-CM | POA: Diagnosis not present

## 2023-05-16 DIAGNOSIS — M62522 Muscle wasting and atrophy, not elsewhere classified, left upper arm: Secondary | ICD-10-CM | POA: Diagnosis not present

## 2023-05-16 DIAGNOSIS — M62561 Muscle wasting and atrophy, not elsewhere classified, right lower leg: Secondary | ICD-10-CM | POA: Diagnosis not present

## 2023-05-17 DIAGNOSIS — I5032 Chronic diastolic (congestive) heart failure: Secondary | ICD-10-CM | POA: Diagnosis not present

## 2023-05-19 ENCOUNTER — Telehealth: Payer: Self-pay | Admitting: *Deleted

## 2023-05-19 DIAGNOSIS — M62521 Muscle wasting and atrophy, not elsewhere classified, right upper arm: Secondary | ICD-10-CM | POA: Diagnosis not present

## 2023-05-19 DIAGNOSIS — M62522 Muscle wasting and atrophy, not elsewhere classified, left upper arm: Secondary | ICD-10-CM | POA: Diagnosis not present

## 2023-05-19 DIAGNOSIS — M62561 Muscle wasting and atrophy, not elsewhere classified, right lower leg: Secondary | ICD-10-CM | POA: Diagnosis not present

## 2023-05-19 DIAGNOSIS — R2689 Other abnormalities of gait and mobility: Secondary | ICD-10-CM | POA: Diagnosis not present

## 2023-05-19 DIAGNOSIS — M62562 Muscle wasting and atrophy, not elsewhere classified, left lower leg: Secondary | ICD-10-CM | POA: Diagnosis not present

## 2023-05-19 NOTE — Telephone Encounter (Signed)
 Patient's Daughter Leonard Downing came by desk to speak with Dr. Sherene Sires.   Patient is currently in long term care at [facility], Last ROV in 08/2021 with Dr. Sherene Sires  Patient's daughter states patient had a recent chest xray on 05/16/23 with extreme SOB and low O2 in 60% at times.   Patient's daughter handed a copy of chest xray report for Dr. Sherene Sires to review and copy was scanned into epic.   Patient's daughter would like to know if patient needs to go to the hospital for further review. [name] informed us her mother is fearful of going to the hospital due to the possibility of being intubated.  Offered an office visit appointment, but patient's daughter denied at this time due to patient not being able to stand up at this time.  Please call and advise patient and daughter.  Best telephone number [phone]

## 2023-05-20 NOTE — Telephone Encounter (Signed)
 Nothing to offer over the phone - would need office eval with actual cxr (not report) to help but if acutely ill agree needs to go to hospital

## 2023-05-20 NOTE — Telephone Encounter (Signed)
 Spoke with patient daughter regarding this, she stated that she was Ask by Dr Reuel Boom to bring the copy by the office to have her specialist to review . I advised the pt 's daughter Dr Sherene Sires will need to be seen to discuss this . Pt hasn't been seen since 08/2021. She mention that her mother is grasping to breath  I told patient is having issue with breathing she will need to got Ed, she said that her mother didn't want to go to the hospital b/c she was afraid that she will  put on hospice care. I advised understood. Given her mother state at this time she might need consider this , however  Dr Sherene Sires can't  doing until she is seen.

## 2023-06-02 ENCOUNTER — Ambulatory Visit: Payer: Medicare Other | Admitting: Internal Medicine

## 2023-06-16 DIAGNOSIS — I5032 Chronic diastolic (congestive) heart failure: Secondary | ICD-10-CM | POA: Diagnosis not present

## 2023-06-16 DIAGNOSIS — J9611 Chronic respiratory failure with hypoxia: Secondary | ICD-10-CM | POA: Diagnosis not present

## 2023-06-16 DIAGNOSIS — D62 Acute posthemorrhagic anemia: Secondary | ICD-10-CM | POA: Diagnosis not present

## 2023-06-19 DIAGNOSIS — I739 Peripheral vascular disease, unspecified: Secondary | ICD-10-CM | POA: Diagnosis not present

## 2023-06-19 DIAGNOSIS — L602 Onychogryphosis: Secondary | ICD-10-CM | POA: Diagnosis not present

## 2023-06-19 DIAGNOSIS — L603 Nail dystrophy: Secondary | ICD-10-CM | POA: Diagnosis not present

## 2023-06-30 DIAGNOSIS — J9611 Chronic respiratory failure with hypoxia: Secondary | ICD-10-CM | POA: Diagnosis not present

## 2023-06-30 DIAGNOSIS — D62 Acute posthemorrhagic anemia: Secondary | ICD-10-CM | POA: Diagnosis not present

## 2023-06-30 DIAGNOSIS — I5032 Chronic diastolic (congestive) heart failure: Secondary | ICD-10-CM | POA: Diagnosis not present

## 2023-07-09 DEATH — deceased
